# Patient Record
Sex: Female | Born: 1955
Health system: Southern US, Community
[De-identification: ages and names within clinical notes are randomized; demographics above are authoritative.]

## PROBLEM LIST (undated history)

## (undated) DIAGNOSIS — M47812 Spondylosis without myelopathy or radiculopathy, cervical region: Secondary | ICD-10-CM

## (undated) DIAGNOSIS — F329 Major depressive disorder, single episode, unspecified: Secondary | ICD-10-CM

## (undated) DIAGNOSIS — K222 Esophageal obstruction: Secondary | ICD-10-CM

## (undated) DIAGNOSIS — E785 Hyperlipidemia, unspecified: Secondary | ICD-10-CM

## (undated) DIAGNOSIS — I10 Essential (primary) hypertension: Secondary | ICD-10-CM

## (undated) DIAGNOSIS — M6520 Calcific tendinitis, unspecified site: Secondary | ICD-10-CM

## (undated) DIAGNOSIS — M797 Fibromyalgia: Secondary | ICD-10-CM

## (undated) DIAGNOSIS — K449 Diaphragmatic hernia without obstruction or gangrene: Secondary | ICD-10-CM

## (undated) DIAGNOSIS — K219 Gastro-esophageal reflux disease without esophagitis: Secondary | ICD-10-CM

## (undated) DIAGNOSIS — F32A Depression, unspecified: Secondary | ICD-10-CM

## (undated) DIAGNOSIS — G4730 Sleep apnea, unspecified: Secondary | ICD-10-CM

## (undated) HISTORY — DX: Spondylosis without myelopathy or radiculopathy, cervical region: M47.812

## (undated) HISTORY — PX: ROTATOR CUFF REPAIR: SHX139

## (undated) HISTORY — PX: ABDOMINAL HYSTERECTOMY: SHX81

## (undated) HISTORY — DX: Hyperlipidemia, unspecified: E78.5

## (undated) HISTORY — PX: SPINE SURGERY: SHX786

## (undated) HISTORY — PX: BREAST EXCISIONAL BIOPSY: SUR124

## (undated) HISTORY — PX: BREAST SURGERY: SHX581

## (undated) HISTORY — DX: Calcific tendinitis, unspecified site: M65.20

## (undated) HISTORY — DX: Essential (primary) hypertension: I10

## (undated) HISTORY — DX: Esophageal obstruction: K22.2

## (undated) HISTORY — DX: Depression, unspecified: F32.A

## (undated) HISTORY — DX: Sleep apnea, unspecified: G47.30

## (undated) HISTORY — DX: Diaphragmatic hernia without obstruction or gangrene: K44.9

## (undated) HISTORY — DX: Gastro-esophageal reflux disease without esophagitis: K21.9

## (undated) HISTORY — PX: APPENDECTOMY: SHX54

## (undated) HISTORY — DX: Fibromyalgia: M79.7

## (undated) HISTORY — PX: TONSILLECTOMY: SUR1361

## (undated) HISTORY — DX: Major depressive disorder, single episode, unspecified: F32.9

## (undated) HISTORY — PX: GALLBLADDER SURGERY: SHX652

## (undated) HISTORY — PX: CHOLECYSTECTOMY: SHX55

---

## 1998-08-28 ENCOUNTER — Ambulatory Visit (HOSPITAL_COMMUNITY): Admission: RE | Admit: 1998-08-28 | Discharge: 1998-08-28 | Payer: Self-pay | Admitting: Gastroenterology

## 1998-08-28 ENCOUNTER — Encounter: Payer: Self-pay | Admitting: Gastroenterology

## 1999-03-26 ENCOUNTER — Ambulatory Visit (HOSPITAL_COMMUNITY): Admission: RE | Admit: 1999-03-26 | Discharge: 1999-03-26 | Payer: Self-pay | Admitting: Gastroenterology

## 1999-03-26 ENCOUNTER — Encounter: Payer: Self-pay | Admitting: Gastroenterology

## 1999-06-10 ENCOUNTER — Ambulatory Visit (HOSPITAL_COMMUNITY): Admission: RE | Admit: 1999-06-10 | Discharge: 1999-06-10 | Payer: Self-pay | Admitting: Gastroenterology

## 1999-06-10 ENCOUNTER — Encounter: Payer: Self-pay | Admitting: Gastroenterology

## 2000-02-05 ENCOUNTER — Other Ambulatory Visit: Admission: RE | Admit: 2000-02-05 | Discharge: 2000-02-05 | Payer: Self-pay | Admitting: Obstetrics and Gynecology

## 2000-07-05 ENCOUNTER — Encounter: Payer: Self-pay | Admitting: Rheumatology

## 2000-07-05 ENCOUNTER — Encounter: Admission: RE | Admit: 2000-07-05 | Discharge: 2000-07-05 | Payer: Self-pay | Admitting: Rheumatology

## 2001-02-04 ENCOUNTER — Other Ambulatory Visit: Admission: RE | Admit: 2001-02-04 | Discharge: 2001-02-04 | Payer: Self-pay | Admitting: *Deleted

## 2001-02-08 ENCOUNTER — Encounter: Payer: Self-pay | Admitting: Obstetrics and Gynecology

## 2001-02-08 ENCOUNTER — Encounter: Admission: RE | Admit: 2001-02-08 | Discharge: 2001-02-08 | Payer: Self-pay | Admitting: *Deleted

## 2001-03-07 ENCOUNTER — Encounter: Admission: RE | Admit: 2001-03-07 | Discharge: 2001-03-09 | Payer: Self-pay | Admitting: *Deleted

## 2001-03-17 ENCOUNTER — Encounter: Admission: RE | Admit: 2001-03-17 | Discharge: 2001-03-17 | Payer: Self-pay | Admitting: Surgery

## 2001-03-17 ENCOUNTER — Encounter: Payer: Self-pay | Admitting: Surgery

## 2002-10-13 ENCOUNTER — Encounter: Admission: RE | Admit: 2002-10-13 | Discharge: 2002-10-13 | Payer: Self-pay | Admitting: Internal Medicine

## 2002-10-13 ENCOUNTER — Encounter: Payer: Self-pay | Admitting: Internal Medicine

## 2002-11-10 ENCOUNTER — Ambulatory Visit (HOSPITAL_COMMUNITY): Admission: RE | Admit: 2002-11-10 | Discharge: 2002-11-10 | Payer: Self-pay | Admitting: Gastroenterology

## 2004-03-04 ENCOUNTER — Encounter: Admission: RE | Admit: 2004-03-04 | Discharge: 2004-03-04 | Payer: Self-pay | Admitting: Internal Medicine

## 2004-03-06 ENCOUNTER — Encounter: Admission: RE | Admit: 2004-03-06 | Discharge: 2004-03-06 | Payer: Self-pay | Admitting: Internal Medicine

## 2004-06-10 ENCOUNTER — Inpatient Hospital Stay (HOSPITAL_COMMUNITY): Admission: RE | Admit: 2004-06-10 | Discharge: 2004-06-17 | Payer: Self-pay | Admitting: Psychiatry

## 2004-06-10 ENCOUNTER — Emergency Department (HOSPITAL_COMMUNITY): Admission: EM | Admit: 2004-06-10 | Discharge: 2004-06-10 | Payer: Self-pay | Admitting: Emergency Medicine

## 2004-06-10 ENCOUNTER — Ambulatory Visit: Payer: Self-pay | Admitting: Psychiatry

## 2006-10-20 ENCOUNTER — Ambulatory Visit: Payer: Self-pay

## 2007-12-16 ENCOUNTER — Encounter: Admission: RE | Admit: 2007-12-16 | Discharge: 2007-12-16 | Payer: Self-pay | Admitting: Internal Medicine

## 2007-12-25 ENCOUNTER — Encounter: Admission: RE | Admit: 2007-12-25 | Discharge: 2007-12-25 | Payer: Self-pay | Admitting: Internal Medicine

## 2008-04-19 ENCOUNTER — Encounter: Admission: RE | Admit: 2008-04-19 | Discharge: 2008-04-19 | Payer: Self-pay | Admitting: Internal Medicine

## 2009-02-04 ENCOUNTER — Emergency Department: Payer: Self-pay | Admitting: Emergency Medicine

## 2009-03-15 ENCOUNTER — Emergency Department: Payer: Self-pay | Admitting: Emergency Medicine

## 2009-03-21 ENCOUNTER — Emergency Department: Payer: Self-pay | Admitting: Emergency Medicine

## 2009-07-08 ENCOUNTER — Encounter
Admission: RE | Admit: 2009-07-08 | Discharge: 2009-08-01 | Payer: Self-pay | Admitting: Physical Medicine & Rehabilitation

## 2009-07-09 ENCOUNTER — Ambulatory Visit: Payer: Self-pay | Admitting: Physical Medicine & Rehabilitation

## 2009-07-24 ENCOUNTER — Encounter: Payer: Self-pay | Admitting: Physical Medicine & Rehabilitation

## 2009-08-06 ENCOUNTER — Encounter
Admission: RE | Admit: 2009-08-06 | Discharge: 2009-08-07 | Payer: Self-pay | Admitting: Physical Medicine & Rehabilitation

## 2009-08-07 ENCOUNTER — Ambulatory Visit: Payer: Self-pay | Admitting: Physical Medicine & Rehabilitation

## 2009-08-10 ENCOUNTER — Encounter: Payer: Self-pay | Admitting: Physical Medicine & Rehabilitation

## 2009-09-05 ENCOUNTER — Encounter
Admission: RE | Admit: 2009-09-05 | Discharge: 2009-12-04 | Payer: Self-pay | Admitting: Physical Medicine & Rehabilitation

## 2009-09-06 ENCOUNTER — Ambulatory Visit: Payer: Self-pay | Admitting: Physical Medicine & Rehabilitation

## 2009-09-10 ENCOUNTER — Encounter: Payer: Self-pay | Admitting: Physical Medicine & Rehabilitation

## 2009-10-18 ENCOUNTER — Ambulatory Visit: Payer: Self-pay | Admitting: Physical Medicine & Rehabilitation

## 2009-11-27 ENCOUNTER — Ambulatory Visit: Payer: Self-pay | Admitting: Physical Medicine & Rehabilitation

## 2009-12-19 ENCOUNTER — Encounter
Admission: RE | Admit: 2009-12-19 | Discharge: 2010-03-14 | Payer: Self-pay | Admitting: Physical Medicine & Rehabilitation

## 2009-12-26 ENCOUNTER — Ambulatory Visit: Payer: Self-pay | Admitting: Physical Medicine & Rehabilitation

## 2010-01-03 DIAGNOSIS — G894 Chronic pain syndrome: Secondary | ICD-10-CM | POA: Insufficient documentation

## 2010-02-06 ENCOUNTER — Ambulatory Visit: Payer: Self-pay | Admitting: Physical Medicine & Rehabilitation

## 2010-02-18 ENCOUNTER — Encounter: Admission: RE | Admit: 2010-02-18 | Discharge: 2010-02-18 | Payer: Self-pay | Admitting: Family Medicine

## 2010-03-14 ENCOUNTER — Encounter
Admission: RE | Admit: 2010-03-14 | Discharge: 2010-06-12 | Payer: Self-pay | Admitting: Physical Medicine & Rehabilitation

## 2010-03-21 ENCOUNTER — Ambulatory Visit: Payer: Self-pay | Admitting: Physical Medicine & Rehabilitation

## 2010-04-24 ENCOUNTER — Ambulatory Visit: Payer: Self-pay | Admitting: Physical Medicine & Rehabilitation

## 2010-05-29 ENCOUNTER — Ambulatory Visit: Payer: Self-pay | Admitting: Physical Medicine & Rehabilitation

## 2010-06-19 ENCOUNTER — Encounter
Admission: RE | Admit: 2010-06-19 | Discharge: 2010-08-07 | Payer: Self-pay | Source: Home / Self Care | Attending: Physical Medicine & Rehabilitation | Admitting: Physical Medicine & Rehabilitation

## 2010-06-27 ENCOUNTER — Ambulatory Visit: Payer: Self-pay | Admitting: Physical Medicine & Rehabilitation

## 2010-07-24 ENCOUNTER — Ambulatory Visit: Payer: Self-pay | Admitting: Physical Medicine & Rehabilitation

## 2010-08-07 ENCOUNTER — Ambulatory Visit: Payer: Self-pay | Admitting: Physical Medicine & Rehabilitation

## 2010-08-07 DIAGNOSIS — M5136 Other intervertebral disc degeneration, lumbar region: Secondary | ICD-10-CM

## 2010-09-03 ENCOUNTER — Encounter
Admission: RE | Admit: 2010-09-03 | Discharge: 2010-09-04 | Payer: Self-pay | Source: Home / Self Care | Attending: Physical Medicine & Rehabilitation | Admitting: Physical Medicine & Rehabilitation

## 2010-09-04 ENCOUNTER — Ambulatory Visit
Admission: RE | Admit: 2010-09-04 | Discharge: 2010-09-04 | Payer: Self-pay | Source: Home / Self Care | Attending: Physical Medicine & Rehabilitation | Admitting: Physical Medicine & Rehabilitation

## 2010-10-02 ENCOUNTER — Ambulatory Visit: Payer: Self-pay

## 2010-10-17 ENCOUNTER — Encounter: Payer: Medicaid Other | Attending: Physical Medicine & Rehabilitation

## 2010-10-17 ENCOUNTER — Ambulatory Visit (HOSPITAL_BASED_OUTPATIENT_CLINIC_OR_DEPARTMENT_OTHER): Payer: Medicaid Other | Admitting: Physical Medicine & Rehabilitation

## 2010-10-17 DIAGNOSIS — E119 Type 2 diabetes mellitus without complications: Secondary | ICD-10-CM | POA: Insufficient documentation

## 2010-10-17 DIAGNOSIS — M67919 Unspecified disorder of synovium and tendon, unspecified shoulder: Secondary | ICD-10-CM | POA: Insufficient documentation

## 2010-10-17 DIAGNOSIS — F329 Major depressive disorder, single episode, unspecified: Secondary | ICD-10-CM

## 2010-10-17 DIAGNOSIS — M47812 Spondylosis without myelopathy or radiculopathy, cervical region: Secondary | ICD-10-CM

## 2010-10-17 DIAGNOSIS — M7530 Calcific tendinitis of unspecified shoulder: Secondary | ICD-10-CM

## 2010-10-17 DIAGNOSIS — IMO0001 Reserved for inherently not codable concepts without codable children: Secondary | ICD-10-CM | POA: Insufficient documentation

## 2010-10-17 DIAGNOSIS — F3289 Other specified depressive episodes: Secondary | ICD-10-CM | POA: Insufficient documentation

## 2010-10-17 DIAGNOSIS — M7520 Bicipital tendinitis, unspecified shoulder: Secondary | ICD-10-CM | POA: Insufficient documentation

## 2010-10-17 DIAGNOSIS — G8929 Other chronic pain: Secondary | ICD-10-CM | POA: Insufficient documentation

## 2010-10-17 DIAGNOSIS — M7700 Medial epicondylitis, unspecified elbow: Secondary | ICD-10-CM | POA: Insufficient documentation

## 2010-10-17 DIAGNOSIS — G43909 Migraine, unspecified, not intractable, without status migrainosus: Secondary | ICD-10-CM | POA: Insufficient documentation

## 2010-10-17 DIAGNOSIS — M719 Bursopathy, unspecified: Secondary | ICD-10-CM | POA: Insufficient documentation

## 2010-11-20 DIAGNOSIS — K219 Gastro-esophageal reflux disease without esophagitis: Secondary | ICD-10-CM | POA: Insufficient documentation

## 2010-11-24 ENCOUNTER — Encounter: Payer: Medicaid Other | Attending: Physical Medicine & Rehabilitation | Admitting: Physical Medicine & Rehabilitation

## 2010-11-27 ENCOUNTER — Encounter: Payer: Medicaid Other | Attending: Physical Medicine & Rehabilitation

## 2010-11-27 DIAGNOSIS — M7530 Calcific tendinitis of unspecified shoulder: Secondary | ICD-10-CM

## 2010-11-27 DIAGNOSIS — M7700 Medial epicondylitis, unspecified elbow: Secondary | ICD-10-CM | POA: Insufficient documentation

## 2010-11-27 DIAGNOSIS — M719 Bursopathy, unspecified: Secondary | ICD-10-CM | POA: Insufficient documentation

## 2010-11-27 DIAGNOSIS — M7520 Bicipital tendinitis, unspecified shoulder: Secondary | ICD-10-CM | POA: Insufficient documentation

## 2010-11-27 DIAGNOSIS — M549 Dorsalgia, unspecified: Secondary | ICD-10-CM | POA: Insufficient documentation

## 2010-11-27 DIAGNOSIS — F329 Major depressive disorder, single episode, unspecified: Secondary | ICD-10-CM

## 2010-11-27 DIAGNOSIS — M67919 Unspecified disorder of synovium and tendon, unspecified shoulder: Secondary | ICD-10-CM | POA: Insufficient documentation

## 2010-11-27 DIAGNOSIS — M47812 Spondylosis without myelopathy or radiculopathy, cervical region: Secondary | ICD-10-CM

## 2010-11-27 DIAGNOSIS — IMO0001 Reserved for inherently not codable concepts without codable children: Secondary | ICD-10-CM | POA: Insufficient documentation

## 2010-11-27 DIAGNOSIS — F3289 Other specified depressive episodes: Secondary | ICD-10-CM

## 2010-11-27 DIAGNOSIS — E119 Type 2 diabetes mellitus without complications: Secondary | ICD-10-CM | POA: Insufficient documentation

## 2010-12-24 ENCOUNTER — Encounter: Payer: Medicaid Other | Attending: Physical Medicine & Rehabilitation | Admitting: Physical Medicine & Rehabilitation

## 2010-12-24 DIAGNOSIS — F329 Major depressive disorder, single episode, unspecified: Secondary | ICD-10-CM | POA: Insufficient documentation

## 2010-12-24 DIAGNOSIS — E119 Type 2 diabetes mellitus without complications: Secondary | ICD-10-CM | POA: Insufficient documentation

## 2010-12-24 DIAGNOSIS — M7530 Calcific tendinitis of unspecified shoulder: Secondary | ICD-10-CM

## 2010-12-24 DIAGNOSIS — M47812 Spondylosis without myelopathy or radiculopathy, cervical region: Secondary | ICD-10-CM

## 2010-12-24 DIAGNOSIS — F3289 Other specified depressive episodes: Secondary | ICD-10-CM | POA: Insufficient documentation

## 2010-12-24 DIAGNOSIS — IMO0001 Reserved for inherently not codable concepts without codable children: Secondary | ICD-10-CM | POA: Insufficient documentation

## 2010-12-24 DIAGNOSIS — M67919 Unspecified disorder of synovium and tendon, unspecified shoulder: Secondary | ICD-10-CM | POA: Insufficient documentation

## 2010-12-24 DIAGNOSIS — M7520 Bicipital tendinitis, unspecified shoulder: Secondary | ICD-10-CM | POA: Insufficient documentation

## 2010-12-24 DIAGNOSIS — M7700 Medial epicondylitis, unspecified elbow: Secondary | ICD-10-CM | POA: Insufficient documentation

## 2010-12-24 DIAGNOSIS — M719 Bursopathy, unspecified: Secondary | ICD-10-CM | POA: Insufficient documentation

## 2010-12-24 NOTE — Assessment & Plan Note (Signed)
Robin Morgan is back regarding her multiple pain complaints.  She has generally been doing fairly well.  She has some stresses at home related to some psychosocial issues, but most part she has been stable.  She does note that she has pain when she is more active.  She visits her father on the farm once a week and notes the next day  she is tight. Usually after any type of prolonged exertion and she sits down, she will wake up or get up later with a lot of pain and tightness.  Shoulder and neck tightness usually leads to headache symptoms.  REVIEW OF SYSTEMS:  Notable for occasional nausea, diarrhea, occasional abdominal pain, bowel control issues, weakness.  Full 12-point review is in the written health and history section of the chart.  SOCIAL HISTORY:  Unchanged.  PHYSICAL EXAMINATION:  VITAL SIGNS:  Blood pressure is 146/60, pulse is 89, respiratory rate 18.  She is satting 97% on room air. GENERAL:  The patient is pleasant, alert and oriented x3.  Affect is generally bright and appropriate. MUSCULOSKELETAL:  She has some tenderness with palpation over the trunk into the shoulder blade and cervical region.  Range of motion was fair. Strength is generally 4+-5/5 throughout. HEART:  Regular. CHEST:  Clear. ABDOMEN:  Soft, nontender.  ASSESSMENT: 1. Fibromyalgia syndrome. 2. Chronic myofascial pain. 3. Right rotator cuff syndrome and bicipital tendonitis. 4. Left medial epicondylitis. 5. Diabetes. 6. Depression.  PLAN: 1. Again, I discussed appropriate stretching, range of motion     modalities, posture, etc.  Therapy would be the most ideal location     for this.  I told her I was not going to force her to go to the     therapy, however.  She will call me if she wants to pursue this.     She will begin working on better exercise regimen in routine going     forward. 2. I have refilled her fentanyl patch 50 mcg q.72 hours, #10 for next     month.  She will stay with Robaxin  p.r.n. as     well as hydrocodone for breakthrough pain. 3. She will see my nurse practitioner back in about 6-7 weeks' time.     Robin Morgan, M.D. Electronically Signed    ZTS/MedQ D:  12/24/2010 11:49:39  T:  12/24/2010 23:09:15  Job #:  [id number]  cc:   Maryelizabeth Rowan, M.D. Fax: [phone]  Elizabeth Palau, FNP Fax: [phone]

## 2010-12-26 NOTE — Discharge Summary (Signed)
NAMEJOORY, GOUGH NO.:  [id number]   MEDICAL RECORD NO.:  [id number]          PATIENT TYPE:  IPS   LOCATION:  0305                          FACILITY:  [facility]   PHYSICIAN:  Jeanice Lim, M.D. DATE OF BIRTH:  1956/05/02   DATE OF ADMISSION:  06/10/2004  DATE OF DISCHARGE:  06/17/2004                                 DISCHARGE SUMMARY   IDENTIFYING DATA:  This is a 55 year old Caucasian female, married,  voluntarily admitted.  Referred to the ER, taking 6 Xanax tablets.  Had  chronic marital conflict.  Was planning on getting a divorce.  Husband may  have been having an affair.  Took Xanax to go to sleep.  Endorsed three  weeks of increased depressed mood and passive suicidal thoughts and poor  sleep.   MEDICAL PROBLEMS:  Fibromyalgia, ankylosing spondylitis.   PAST PSYCHIATRIC HISTORY:  First [facility] admission and  first inpatient treatment.  Followed by Dr. Evelene Croon.  No prior suicide  attempts.   MEDICATIONS:  See multiple medications on admission assessment.   ALLERGIES:  COMPAZINE and NAPROSYN.   PHYSICAL EXAMINATION:  Physical and neurologic exam within normal limits.   LABORATORY DATA:  Routine admission labs within normal limits.   MENTAL STATUS EXAM:  Fully alert, tearful, cooperative.  Speech normal.  Mood depressed, hopeless, helpless at times.  Thought processes positive  fleeting suicidal ideation.  No plan.  Some thought agitation.  Cognitively  intact.  Judgment and insight fair.   ADMISSION DIAGNOSES:   AXIS I:  Major depressive disorder, recurrent, moderate.   AXIS II:  Deferred.   AXIS III:  1.  Diabetes mellitus, type 2.  2.  Fibromyalgia.  3.  Chronic diarrhea.   AXIS IV:  Severe (chronic marital strife and limited support system).   AXIS V:  20/60.   HOSPITAL COURSE:  The patient was admitted and ordered routine p.r.n.  medications and underwent further monitoring.  Was encouraged to participate  in  individual, group and milieu therapy.  The patient was placed on safety  checks.  Neurontin was optimized for chronic pain and patient was started on  Cymbalta.  Family conflict seemed to be one of the primary triggers.  The  patient also complained of fibromyalgia, son had died, sister had died.  Admitted to taking more Xanax than she was supposed to.  Had difficulty with  pain and depression.  Followed by Dr. Evelene Croon.  Had fleeting suicidal thoughts  initially and, as she was stabilized on medications and received clinical  intervention and family meeting was held with husband, which patient became  somewhat increasingly agitated during, there was further problem-solving,  coping skills worked on, and adjustments on medications, the patient  continued to improved.   CONDITION ON DISCHARGE:  Discharged in improved condition with euthymic  mood.  Affect brighter.  No suicidal thoughts.  No psychotic symptoms.  Had  good aftercare plan as well as more able to problem-solve with improved  judgment and insight.  She was given medication education.   DISCHARGE MEDICATIONS:  1.  Glucophage to continue as  previously prescribed.  2.  Metformin to continue as previously prescribed.  3.  Inderal to take as prescribed by outpatient physician.  4.  Protonix to take as prescribed by outpatient physician.  5.  Zocor to take as prescribed by outpatient physician.  6.  Lamictal to take as prescribed by outpatient physician.  7.  Ultram to take as prescribed by outpatient physician.  8.  Cymbalta 30 mg, 3 q.a.m.  9.  Neurontin 300 mg, 1 at 9 a.m., 3 p.m. and 9 p.m.  10. Xanax 0.25 mg, 1 t.i.d. and 2 at bedtime.  11. Ambien 10 mg, 1 at bedtime p.r.n. for insomnia.   FOLLOW UP:  Dr. Evelene Croon on Tuesday, July 01, 2004 at 1:45 p.m. and [facility] with Darrold Junker for therapy on June 26, 2004 at 3 p.m.   DISCHARGE DIAGNOSES:   AXIS I:  Major depressive disorder, recurrent, moderate.    AXIS II:  Deferred.   AXIS III:  1.  Diabetes mellitus, type 2.  2.  Fibromyalgia.  3.  Chronic diarrhea.   AXIS IV:  Severe (chronic marital strife and limited support system).   AXIS V:  Global Assessment of Functioning on discharge 55-60.     Jame   JEM/MEDQ  D:  07/17/2004  T:  07/17/2004  Job:  [id number]

## 2010-12-26 NOTE — Op Note (Signed)
   NAME:  Robin Morgan, Robin Morgan                        ACCOUNT NO.:  [id number]   MEDICAL RECORD NO.:  [id number]                   PATIENT TYPE:  AMB   LOCATION:  ENDO                                 FACILITY:  [facility]   PHYSICIAN:  John C. Madilyn Fireman, M.D.                 DATE OF BIRTH:  1956-05-29   DATE OF PROCEDURE:  11/10/2002  DATE OF DISCHARGE:                                 OPERATIVE REPORT   PROCEDURE:  Esophagogastroduodenoscopy with esophageal dilatation.   INDICATIONS FOR PROCEDURE:  Recurrent dysphagia which has responded to  esophageal dilatation in the past although no definite stricture seen at  that time.   DESCRIPTION OF PROCEDURE:  The patient was placed in the left lateral  decubitus position then placed on the pulse monitor with continuous low flow  oxygen delivered by nasal cannula. She was sedated with 75 mcg IV fentanyl  and 9 mg IV Versed. The Olympus video endoscope was advanced under direct  vision into the oropharynx and esophagus. The esophagus was straight and of  normal caliber with the squamocolumnar line at 38 cm. There was no visible  hiatal hernia, ring, stricture or other abnormality of the GE junction. The  stomach was entered and a small amount of liquid secretions were suctioned  from the fundus. Retroflexed view of the cardia was unremarkable. The  fundus, body, antrum and pylorus all appeared normal. The duodenum was  entered and both the bulb and second portion are well inspected and appeared  to be within normal limits. A Savary guidewire was placed through the  endoscope channel and the scope withdrawn. Savary dilators of 16 and 17 mm  were passed consecutively under fluoroscopic visualization with minimal  resistance and no blood seen on withdrawal. The last dilator was removed  together with the wire and the patient returned to the recovery room in  stable condition. The patient tolerated the procedure well and there were no  immediate  complications.   IMPRESSION:  Basically normal endoscopy status post dilatation to 17 mm.   PLAN:  Advance diet and observe response to dilatation.                                               John C. Madilyn Fireman, M.D.    JCH/MEDQ  D:  11/10/2002  T:  11/10/2002  Job:  [id number]   cc:   Georgann Housekeeper, M.D.  [address]  [place]  [place] [address]  Fax: [phone]

## 2011-02-05 ENCOUNTER — Encounter: Payer: Medicaid Other | Attending: Physical Medicine & Rehabilitation | Admitting: Neurosurgery

## 2011-02-05 DIAGNOSIS — IMO0001 Reserved for inherently not codable concepts without codable children: Secondary | ICD-10-CM

## 2011-02-05 DIAGNOSIS — M75100 Unspecified rotator cuff tear or rupture of unspecified shoulder, not specified as traumatic: Secondary | ICD-10-CM

## 2011-02-05 DIAGNOSIS — R5381 Other malaise: Secondary | ICD-10-CM | POA: Insufficient documentation

## 2011-02-05 DIAGNOSIS — M7700 Medial epicondylitis, unspecified elbow: Secondary | ICD-10-CM | POA: Insufficient documentation

## 2011-02-05 DIAGNOSIS — F329 Major depressive disorder, single episode, unspecified: Secondary | ICD-10-CM

## 2011-02-05 DIAGNOSIS — R5383 Other fatigue: Secondary | ICD-10-CM | POA: Insufficient documentation

## 2011-02-05 DIAGNOSIS — E119 Type 2 diabetes mellitus without complications: Secondary | ICD-10-CM | POA: Insufficient documentation

## 2011-02-05 DIAGNOSIS — M719 Bursopathy, unspecified: Secondary | ICD-10-CM | POA: Insufficient documentation

## 2011-02-05 DIAGNOSIS — F3289 Other specified depressive episodes: Secondary | ICD-10-CM | POA: Insufficient documentation

## 2011-02-05 DIAGNOSIS — M62838 Other muscle spasm: Secondary | ICD-10-CM | POA: Insufficient documentation

## 2011-02-05 DIAGNOSIS — M67919 Unspecified disorder of synovium and tendon, unspecified shoulder: Secondary | ICD-10-CM | POA: Insufficient documentation

## 2011-02-06 NOTE — Assessment & Plan Note (Signed)
This patient of Dr. Riley Kill, for multiple pain complaints.  She has fibromyalgia, states she has been doing fairly well and has no changes in her condition.  She rates her average pain about 8-9, it is constant aching-type pain.  General activity level 6-7.  Pain is worse in the daytime and night, sitting, some activities tend to aggravate rest. Heat and medication help.  She walks without assistance.  She climb steps and drives, she is on disability.  REVIEW OF SYSTEMS:  Notable for the difficulties described above as well as diarrhea, nausea, bladder and bowel control problems, and intermittent weakness, depression, spasms, no suicidal thoughts or aberrant behaviors.  PAST MEDICAL HISTORY AND SOCIAL HISTORY:  Unchanged.  FAMILY HISTORY:  Unchanged.  PHYSICAL EXAMINATION:  VITAL SIGNS:  Blood pressure was not obtained, pulse 89, respirations 16, O2 sats 97 on room air. MUSCULOSKELETAL:  Motor strength is good in the lower upper and lower extremities.  Sensation is intact. NEUROLOGIC:  Constitutionally within normal limits.  She is alert and oriented x3.  She walks with normal gait.  ASSESSMENT: 1. Fibromyalgia. 2. Rotator cuff syndrome on the right. 3. Intermittent left medial epicondylitis. 4. Diabetes. 5. Depression.  PLAN: 1. She has not had any formal physical therapy.  She is doing home     exercises that she can. 2. She will continue her current medication regimen. 3. Refill her fentanyl 50 mcg one transdermally every 72 hours, 10 no     refill. 4. Hydrocodone 5/500 one-half to one p.o. daily, 60 with no refill.  Her questions were encouraged and answered.     Mollee Neer L. Blima Dessert Electronically Signed    RLW/MedQ D:  02/05/2011 10:31:11  T:  02/06/2011 00:26:46  Job #:  [id number]

## 2011-03-05 ENCOUNTER — Encounter: Payer: Medicaid Other | Attending: Physical Medicine & Rehabilitation | Admitting: Neurosurgery

## 2011-03-05 DIAGNOSIS — E119 Type 2 diabetes mellitus without complications: Secondary | ICD-10-CM | POA: Insufficient documentation

## 2011-03-05 DIAGNOSIS — IMO0001 Reserved for inherently not codable concepts without codable children: Secondary | ICD-10-CM | POA: Insufficient documentation

## 2011-03-05 DIAGNOSIS — R5381 Other malaise: Secondary | ICD-10-CM | POA: Insufficient documentation

## 2011-03-05 DIAGNOSIS — F329 Major depressive disorder, single episode, unspecified: Secondary | ICD-10-CM | POA: Insufficient documentation

## 2011-03-05 DIAGNOSIS — M7700 Medial epicondylitis, unspecified elbow: Secondary | ICD-10-CM | POA: Insufficient documentation

## 2011-03-05 DIAGNOSIS — M25529 Pain in unspecified elbow: Secondary | ICD-10-CM

## 2011-03-05 DIAGNOSIS — R5383 Other fatigue: Secondary | ICD-10-CM | POA: Insufficient documentation

## 2011-03-05 DIAGNOSIS — M67919 Unspecified disorder of synovium and tendon, unspecified shoulder: Secondary | ICD-10-CM | POA: Insufficient documentation

## 2011-03-05 DIAGNOSIS — M719 Bursopathy, unspecified: Secondary | ICD-10-CM | POA: Insufficient documentation

## 2011-03-05 DIAGNOSIS — F3289 Other specified depressive episodes: Secondary | ICD-10-CM | POA: Insufficient documentation

## 2011-03-05 DIAGNOSIS — M62838 Other muscle spasm: Secondary | ICD-10-CM | POA: Insufficient documentation

## 2011-03-05 NOTE — Assessment & Plan Note (Signed)
ACCOUNT:  [id number].  This is a patient of Dr. Riley Kill, seen for multiple pain complaints, fibromyalgia.  She states she had been doing some better.  She still have some aching in her back and legs, but otherwise unchanged.  She does have some right biceps pain and she uses Voltaren gel on that.  She rates her pain at 7-8.  Her activity level is the same.  The pain is worse in the evening and night.  Sleep patterns are fair.  Most activities aggravate.  Medication and injections tend to help.  She walks without assistance.  She can drive, climb steps.  She walks about 15 minutes at a times.  She is on disability.  REVIEW OF SYSTEMS:  Notable for those difficulties as well as some fluctuating blood sugars, spasm, depression.  No suicidal thoughts or aberrant behaviors.  PAST MEDICAL HISTORY:  Unchanged.  SOCIAL HISTORY:  Unchanged.  FAMILY HISTORY:  Unchanged.  PHYSICAL EXAM:  Her vital signs were not obtained today because of the biceps pain.  She is having her O2 sats 99 on room air, respirations are 20.  Constitutionally, she is within normal limits.  She is alert and oriented x3.  She has normal gait.  Her strength and sensation are intact.  ASSESSMENT: 1. Fibromyalgia. 2. History of rotator cuff syndrome on the right. 3. Intermittent left medial epicondylitis. 4. Diabetes. 5. Depression.  PLAN: 1. She will continue her home exercise program. 2. Her current medications will stay the same. 3. We will refill her vitamin D 50,000 units one p.o. q.4 week, two     refills. 4. Fentanyl 50 mcg once transdermally every 72 hours, #10 with no     refill. 5. Hydrocodone 5/500 one half to one every hour b.i.d., #60 with no     refill.  She will follow up here in the clinic in 1 month.  Her     questions were encouraged and answered.     Xoe Hoe L. Blima Dessert Electronically Signed    RLW/MedQ D:  03/05/2011 21:30:86  T:  03/05/2011 09:48:09  Job #:  [id number]

## 2011-04-02 ENCOUNTER — Encounter: Payer: Medicaid Other | Attending: Physical Medicine & Rehabilitation | Admitting: Neurosurgery

## 2011-04-02 DIAGNOSIS — M25529 Pain in unspecified elbow: Secondary | ICD-10-CM

## 2011-04-02 DIAGNOSIS — R5381 Other malaise: Secondary | ICD-10-CM | POA: Insufficient documentation

## 2011-04-02 DIAGNOSIS — F329 Major depressive disorder, single episode, unspecified: Secondary | ICD-10-CM | POA: Insufficient documentation

## 2011-04-02 DIAGNOSIS — R5383 Other fatigue: Secondary | ICD-10-CM | POA: Insufficient documentation

## 2011-04-02 DIAGNOSIS — M719 Bursopathy, unspecified: Secondary | ICD-10-CM | POA: Insufficient documentation

## 2011-04-02 DIAGNOSIS — M62838 Other muscle spasm: Secondary | ICD-10-CM | POA: Insufficient documentation

## 2011-04-02 DIAGNOSIS — IMO0001 Reserved for inherently not codable concepts without codable children: Secondary | ICD-10-CM

## 2011-04-02 DIAGNOSIS — M7700 Medial epicondylitis, unspecified elbow: Secondary | ICD-10-CM | POA: Insufficient documentation

## 2011-04-02 DIAGNOSIS — E119 Type 2 diabetes mellitus without complications: Secondary | ICD-10-CM | POA: Insufficient documentation

## 2011-04-02 DIAGNOSIS — S43429A Sprain of unspecified rotator cuff capsule, initial encounter: Secondary | ICD-10-CM

## 2011-04-02 DIAGNOSIS — M67919 Unspecified disorder of synovium and tendon, unspecified shoulder: Secondary | ICD-10-CM | POA: Insufficient documentation

## 2011-04-02 DIAGNOSIS — F3289 Other specified depressive episodes: Secondary | ICD-10-CM | POA: Insufficient documentation

## 2011-04-02 NOTE — Assessment & Plan Note (Signed)
HISTORY:  Patient of Dr. Riley Kill, seen for multiple pain complaints as well as fibromyalgia.  Today, she tells me that she has some right shoulder pain.  She has had surgery with Dr. Jerl Santos before and feels like her rotator cuff may be "messed up again" but she is not going back to Dr. Jerl Santos, due to financial reasons.  Her average pain is 8-9, it is aching type pain.  General activity level is 9.  Pain is worse in morning and night.  Pain is worse with standing.  Rest and medication tend to help.  She is functionally independent with her mobility.  She is on disability.  REVIEW OF SYSTEMS:  Notable for those difficulties well as some numbness and spasms, depression.  No anxiety or suicidal thoughts or aberrant behaviors.  PAST MEDICAL HISTORY:  Unchanged.  SOCIAL HISTORY:  Divorced.  FAMILY HISTORY:  Unchanged.  PHYSICAL EXAMINATION:  VITAL SIGNS:  Blood pressure 138/74, pulse 95, respirations 16, and O2 sats 94% on room air. EXTREMITIES:  Motor strength in her upper extremities and the deltoid, biceps, triceps is 5/5.  She has good strength in lower extremities. Sensation is intact. CONSTITUTIONAL:  Within normal limits.  She is alert and oriented x3. She has a normal gait.  ASSESSMENT: 1. Fibromyalgia. 2. History of rotator cuff repair with acute pain. 3. Diabetes. 4. Depression.  PLAN: 1. She will continue her home exercise program that she is doing.  She     does not need refills today.  She will continue her current     medication regimen of hydrocodone and fentanyl patch. 2. I explained to the patient I could inject her shoulder today she     accepted that.  After informed consent, I alcohol prepped the right     posterior AC joint portal and injected her with 3 mL of lidocaine     and 1 mL of 40 mg of Depo-Medrol, she tolerated well.  She noticed     ice that tonight and not do any heavy lifting, but to keep some     slight range of motion going for the next  couple days, otherwise     her questions were encouraged and answered.  She will do well and     will see her back in a month.     Deshea Pooley L. Blima Dessert Electronically Signed    RLW/MedQ D:  04/02/2011 10:24:30  T:  04/02/2011 12:01:58  Job #:  [id number]

## 2011-05-01 ENCOUNTER — Encounter: Payer: Medicaid Other | Attending: Physical Medicine & Rehabilitation | Admitting: Physical Medicine & Rehabilitation

## 2011-05-01 DIAGNOSIS — F329 Major depressive disorder, single episode, unspecified: Secondary | ICD-10-CM

## 2011-05-01 DIAGNOSIS — M542 Cervicalgia: Secondary | ICD-10-CM | POA: Insufficient documentation

## 2011-05-01 DIAGNOSIS — IMO0001 Reserved for inherently not codable concepts without codable children: Secondary | ICD-10-CM

## 2011-05-01 DIAGNOSIS — Z9889 Other specified postprocedural states: Secondary | ICD-10-CM | POA: Insufficient documentation

## 2011-05-01 DIAGNOSIS — M7530 Calcific tendinitis of unspecified shoulder: Secondary | ICD-10-CM

## 2011-05-01 DIAGNOSIS — M25519 Pain in unspecified shoulder: Secondary | ICD-10-CM | POA: Insufficient documentation

## 2011-05-01 DIAGNOSIS — R52 Pain, unspecified: Secondary | ICD-10-CM | POA: Insufficient documentation

## 2011-05-01 DIAGNOSIS — M47812 Spondylosis without myelopathy or radiculopathy, cervical region: Secondary | ICD-10-CM

## 2011-05-01 DIAGNOSIS — E119 Type 2 diabetes mellitus without complications: Secondary | ICD-10-CM | POA: Insufficient documentation

## 2011-05-01 NOTE — Assessment & Plan Note (Signed)
HISTORY:  Robin Morgan is back regarding her multiple pain complaints.  She continues to pain complaint of the neck and scapular pain, more lateral shoulder pain, it was better after the injection performed by Lauree Chandler, a month ago.  She notes that the neck bothers her when she is up standing prolonged, sitting for prolonged period of time, and when she is more active.  She rates her pain and 7-8/10.  REVIEW OF SYSTEMS:  Notable for the above.  Full 12-point review is in the written health and history section of the chart.  SOCIAL HISTORY:  Unchanged.  PHYSICAL EXAMINATION:  VITAL SIGNS:  Blood pressure is 144/55, pulse is 115, respiratory rate 18, and she is satting 95% on room air. GENERAL:  The patient is generally pleasant.  She says with fairly good posture in the head  and bit of a forward position.  When I palpated the neck, there is trigger points along the mid to upper trap as well as long the right rhomboid.  She is a bit spastic all throughout the rhomboid in fact.  The right shoulder may have been a bit more protracted in the left.  Strength is grossly 5/5, normal sensation. Surgical scars noted in the cervical spine from about C5 to C1.  ASSESSMENT: 1. Fibromyalgia syndrome. 2. Rotator cuff repair with acute pain which is improved after     injection. 3. Cervical myofascial pain. 4. Remote cervical surgery/decompression. 5. Diabetes.  PLAN: 1. After informed consent, we injected four trigger ports each with 2     mL of 1% lidocaine.  I injected 3  areas in the trap and one area     in the rhomboid today. 2. I refilled her fentanyl patch #10, 50 mcg and hydrocodone 5/500,     #60. 3. Discussed appropriate posture, stretching, modalities including     heat nice. 4. I will see her back in about a month.  If pain is not improving,     may consider followup imaging of the neck which has been done for     some time per the patient.     Ranelle Oyster,  M.D. Electronically Signed    ZTS/MedQ D:  05/01/2011 13:41:54  T:  05/01/2011 17:37:38  Job #:  [id number]

## 2011-05-29 ENCOUNTER — Encounter: Payer: Medicaid Other | Attending: Neurosurgery | Admitting: Neurosurgery

## 2011-05-29 DIAGNOSIS — M542 Cervicalgia: Secondary | ICD-10-CM

## 2011-05-29 DIAGNOSIS — M25569 Pain in unspecified knee: Secondary | ICD-10-CM | POA: Insufficient documentation

## 2011-05-29 DIAGNOSIS — IMO0001 Reserved for inherently not codable concepts without codable children: Secondary | ICD-10-CM | POA: Insufficient documentation

## 2011-05-29 DIAGNOSIS — R51 Headache: Secondary | ICD-10-CM | POA: Insufficient documentation

## 2011-05-29 DIAGNOSIS — M549 Dorsalgia, unspecified: Secondary | ICD-10-CM | POA: Insufficient documentation

## 2011-05-29 DIAGNOSIS — M961 Postlaminectomy syndrome, not elsewhere classified: Secondary | ICD-10-CM

## 2011-05-30 NOTE — Assessment & Plan Note (Signed)
This is a patient of Dr. Riley Kill seen for multiple pain complaints, fibromyalgia, back pain, and headache.  She states that the trigger points he gave her last time on the right helped right-sided headache, but she has had left-sided headache ever since.  She rates her pain at 8 or 9.  It is a constant pain.  General activity level is 8-9.  Pain is worse during the day.  Sleep patterns are poor.  Pain is worse with sitting and standing.  Medication tends to help with injections. Functionally, she is independent.  She does drive.  She is on disability.  She needs some help with household duties and shopping.  REVIEW OF SYSTEMS:  Notable for difficulties described above as well as some blood sugar fluctuations, some diarrhea, bowel control issues, and weakness.  No depression, suicidal thoughts, or aberrant behaviors. Pill counts were correct.  Last UDS was consistent.  Past medical history, social history, and family history unchanged.  PHYSICAL EXAMINATION:  VITAL SIGNS:  Blood pressure is 118/68, pulse was 72, respirations 18, and O2 saturation was 97 on room air.  NEUROLOGIC: Constitutionally, she is within normal limits.  She is alert and oriented x3.  Her gait is normal.  Motor strength is intact.  Sensation is intact.  ASSESSMENT: 1. Fibromyalgia. 2. Rotator cuff repair, pain improved after injection. 3. Cervical myofascial pain.  History of the cervical decompression.  PLAN: 1. Refill Flexeril 10 mg 1 p.o. q.8 h p.r.n., 60 with 3 refills. 2. Hydrocodone 5/500 one-half to 1 p.o. b.i.d., 60 with no refill. 3. Fentanyl 50 mcg 1 transdermally every 72 hours, 10 with no refill.     Her questions were encouraged and answered.  She will follow up     here in 1 month. 4. Due to the left-sided neck pain she is having, we did inject her     after informed consent and proper time-out.  I cleansed the left     side just left to the paracervical muscles on the left side of the  cervical spine with alcohol and injected 1 mL of lidocaine at 3     rhomboid points, and 1 trapezius point.  She tolerated it well.     There was no bleeding.  We will see her back in a month.     Juvencio Verdi L. Blima Dessert Electronically Signed    RLW/MedQ D:  05/29/2011 13:01:20  T:  05/30/2011 00:11:53  Job #:  [id number]

## 2011-06-26 ENCOUNTER — Ambulatory Visit (HOSPITAL_COMMUNITY)
Admission: RE | Admit: 2011-06-26 | Discharge: 2011-06-26 | Disposition: A | Discharge status: Home / Self Care | Payer: Medicaid Other | Source: Ambulatory Visit | Attending: Neurosurgery | Admitting: Neurosurgery

## 2011-06-26 ENCOUNTER — Encounter: Payer: Medicaid Other | Attending: Physical Medicine & Rehabilitation | Admitting: Physical Medicine & Rehabilitation

## 2011-06-26 ENCOUNTER — Other Ambulatory Visit: Payer: Self-pay | Admitting: Neurosurgery

## 2011-06-26 DIAGNOSIS — M25529 Pain in unspecified elbow: Secondary | ICD-10-CM

## 2011-06-26 DIAGNOSIS — M47812 Spondylosis without myelopathy or radiculopathy, cervical region: Secondary | ICD-10-CM | POA: Insufficient documentation

## 2011-06-26 DIAGNOSIS — M542 Cervicalgia: Secondary | ICD-10-CM | POA: Insufficient documentation

## 2011-06-26 DIAGNOSIS — R52 Pain, unspecified: Secondary | ICD-10-CM

## 2011-06-26 DIAGNOSIS — M7530 Calcific tendinitis of unspecified shoulder: Secondary | ICD-10-CM

## 2011-06-26 DIAGNOSIS — R51 Headache: Secondary | ICD-10-CM | POA: Insufficient documentation

## 2011-06-26 DIAGNOSIS — IMO0001 Reserved for inherently not codable concepts without codable children: Secondary | ICD-10-CM

## 2011-06-26 NOTE — Assessment & Plan Note (Signed)
Robin Morgan is back regarding her multiple complaints.  She is having more neck pain and headaches recently.  She had trigger point injection with our nurse practitioner at last visit and this provided no relief.  It is on the left paracervical musculature.  Today, her pain is 7-8/10 described as aching.  Neck is most tender with extension and when she lays in the supine, sleep is poor as a result.  She denies any numbness or weakness in the arms.  REVIEW OF SYSTEMS:  Notable for spasms, depression, diarrhea, high sugars.  Full 12-point review is in the written health and history section of the chart.  SOCIAL HISTORY:  Patient is divorced.  PHYSICAL EXAMINATION:  VITAL SIGNS:  Blood pressure is 138/69, pulse 95, respiratory rate 18, she is satting 96% on room air. GENERAL:  Patient is pleasant, alert.  She moves all 4's. MUSCULOSKELETAL:  She had 5/5 strength in both upper extremities with normal sensory exam and 2+ reflexes.  Neck was painful with extension and compression and facet maneuvers to the right.  There is pain to palpation over upper trapezius and paraspinal musculature into the splenius capitis region.  She had some mild tenderness over the occipital region. NEUROLOGIC:  She is alert and appropriate and cognitively intact. HEART:  Regular. CHEST:  Clear. ABDOMEN:  Soft, nontender.  ASSESSMENT: 1. Fibromyalgia. 2. History of rotator cuff repair. 3. Cervicalgia with prior cervical decompression.  PLAN: 1. After informed consent, we injected the right trapezius and     splenius capitis muscles with a 2 mL of 1% lidocaine each. 2. We will send her for x-rays of the cervical spine to assess her     facets and disk spaces. 3. Continue with fentanyl patch 50 mcg q.72h hours #10 and hydrocodone     half to one p.o. b.i.d. #60. 4. 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     Ranelle Oyster, M.D. Electronically Signed    ZTS/MedQ D:  06/26/2011  13:25:33  T:  06/26/2011 19:43:57  Job #:  [id number]

## 2011-07-27 ENCOUNTER — Encounter: Payer: Medicaid Other | Attending: Neurosurgery | Admitting: Neurosurgery

## 2011-07-27 DIAGNOSIS — M25519 Pain in unspecified shoulder: Secondary | ICD-10-CM | POA: Insufficient documentation

## 2011-07-27 DIAGNOSIS — M25529 Pain in unspecified elbow: Secondary | ICD-10-CM

## 2011-07-27 DIAGNOSIS — IMO0001 Reserved for inherently not codable concepts without codable children: Secondary | ICD-10-CM | POA: Insufficient documentation

## 2011-07-27 DIAGNOSIS — M542 Cervicalgia: Secondary | ICD-10-CM

## 2011-07-28 NOTE — Assessment & Plan Note (Signed)
The patient of Dr. Riley Kill seen for multiple pain complaints with shoulder and back pain.  She reports no change in her pain as 7 or 8. She has aching type pain that is constant.  General activity level is 7- 8.  Pain is worse in the morning and night.  Sleep patterns are poor. Pain is worse with standing.  Heat, ice and medication tend to help. She climb steps and drives.  Functionally, she is on disability.  REVIEW OF SYSTEMS:  Notable for difficulties described above as well as some bowel and bladder control issues, spasm, depression.  No suicidal thoughts or aberrant behavior.  Last pill count UDS consistent.  PAST MEDICAL HISTORY, SOCIAL HISTORY AND FAMILY HISTORY:  Unchanged.  PHYSICAL EXAMINATION:  VITAL SIGNS:  Her blood pressure is 133/73, pulse 87, respirations 16 and O2 sats 98 on room air.  EXTREMITIES:  Motor strength and sensation are intact. CONSTITUTIONAL:  She is within normal limits.  She is alert and oriented x3.  Gait is normal.  She states she did have some radiographs completed over cervical spine.  The results are not in the chart.  I will have to look those up and get back to her.  She reports her headaches and neck feel much better after the injections.  She will continue her current medications.  She has fentanyl and hydrocodone which she is getting filled today.  She does not need a prescription and we will see her back in a month.  ASSESSMENT: 1. Fibromyalgia. 2. History of rotator cuff repair. 3. Cervicalgia.  PLAN: 1. She will continue her current medications as prescribed.  No     refills given today 2. Her questions were encouraged and answered.  We will see her back     in a month.     Robin Morgan L. Blima Dessert Electronically Signed    RLW/MedQ D:  07/27/2011 13:16:54  T:  07/28/2011 02:40:46  Job #:  [id number]

## 2011-08-26 ENCOUNTER — Encounter: Payer: Medicaid Other | Attending: Neurosurgery | Admitting: Neurosurgery

## 2011-08-26 DIAGNOSIS — IMO0001 Reserved for inherently not codable concepts without codable children: Secondary | ICD-10-CM

## 2011-08-26 DIAGNOSIS — M25519 Pain in unspecified shoulder: Secondary | ICD-10-CM | POA: Insufficient documentation

## 2011-08-26 DIAGNOSIS — M542 Cervicalgia: Secondary | ICD-10-CM | POA: Insufficient documentation

## 2011-08-26 DIAGNOSIS — M549 Dorsalgia, unspecified: Secondary | ICD-10-CM | POA: Insufficient documentation

## 2011-08-26 DIAGNOSIS — M25529 Pain in unspecified elbow: Secondary | ICD-10-CM

## 2011-08-27 NOTE — Assessment & Plan Note (Signed)
This is a patient of Dr. Riley Kill, seen for multiple pain complaints and shoulder, neck, and back pain.  She reports no change in her pain at 8 or 9.  It is a sharp, aching pain.  General activity level is 7 or 8. Pain is worse in the evening and night.  Sleep patterns are poor.  Ice and medication tend to help.  She is independent.  She does drive. Functionally, she is on disability.  REVIEW OF SYSTEMS:  Notable for difficulties described above as well as some numbness, spasm, depression. No suicidal thoughts or aberrant behaviors.  Last pill count and UDS consistent.  PAST MEDICAL HISTORY, SOCIAL HISTORY, AND FAMILY HISTORY:  Unchanged.  PHYSICAL EXAMINATION:  Her blood pressure 183/84, pulse 90 respirations 16, O2 sats 98 on room air.  Motor strength and sensation are intact. Constitutionally, she is within normal limits.  She is alert and oriented x3.  She has normal gait.  ASSESSMENT: 1. Fibromyalgia. 2. Rotator cuff. 3. History of repair was with chronic pain. 4. Cervicalgia.  PLAN: 1. Refill fentanyl 50 mcg 1 transdermally every 72 hours, 10 with no     refill. 2. Hydrocodone 5/500 one-half to one p.o. b.i.d., 60 with no refill.     Her questions were encouraged and answered.  She will follow up in     a month.     Cally Nygard L. Blima Dessert Electronically Signed    RLW/MedQ D:  08/26/2011 13:11:35  T:  08/27/2011 05:48:45  Job #:  [id number]

## 2011-09-23 ENCOUNTER — Encounter: Payer: Medicaid Other | Attending: Neurosurgery

## 2011-09-23 DIAGNOSIS — M25529 Pain in unspecified elbow: Secondary | ICD-10-CM

## 2011-09-23 DIAGNOSIS — M542 Cervicalgia: Secondary | ICD-10-CM | POA: Insufficient documentation

## 2011-09-23 DIAGNOSIS — M47812 Spondylosis without myelopathy or radiculopathy, cervical region: Secondary | ICD-10-CM

## 2011-09-23 DIAGNOSIS — IMO0001 Reserved for inherently not codable concepts without codable children: Secondary | ICD-10-CM

## 2011-09-23 DIAGNOSIS — M25519 Pain in unspecified shoulder: Secondary | ICD-10-CM | POA: Insufficient documentation

## 2011-09-23 DIAGNOSIS — M549 Dorsalgia, unspecified: Secondary | ICD-10-CM | POA: Insufficient documentation

## 2011-10-28 ENCOUNTER — Encounter: Payer: Medicaid Other | Attending: Neurosurgery | Admitting: Physical Medicine & Rehabilitation

## 2011-10-28 ENCOUNTER — Encounter: Payer: Self-pay | Admitting: Physical Medicine & Rehabilitation

## 2011-10-28 VITALS — BP 124/73 | HR 96 | Resp 18 | Ht 62.0 in | Wt 140.0 lb

## 2011-10-28 DIAGNOSIS — M7918 Myalgia, other site: Secondary | ICD-10-CM | POA: Insufficient documentation

## 2011-10-28 DIAGNOSIS — F32A Depression, unspecified: Secondary | ICD-10-CM

## 2011-10-28 DIAGNOSIS — M719 Bursopathy, unspecified: Secondary | ICD-10-CM | POA: Insufficient documentation

## 2011-10-28 DIAGNOSIS — IMO0001 Reserved for inherently not codable concepts without codable children: Secondary | ICD-10-CM

## 2011-10-28 DIAGNOSIS — F329 Major depressive disorder, single episode, unspecified: Secondary | ICD-10-CM

## 2011-10-28 DIAGNOSIS — M67919 Unspecified disorder of synovium and tendon, unspecified shoulder: Secondary | ICD-10-CM | POA: Insufficient documentation

## 2011-10-28 DIAGNOSIS — F3289 Other specified depressive episodes: Secondary | ICD-10-CM | POA: Insufficient documentation

## 2011-10-28 DIAGNOSIS — F339 Major depressive disorder, recurrent, unspecified: Secondary | ICD-10-CM | POA: Insufficient documentation

## 2011-10-28 MED ORDER — HYDROCODONE-ACETAMINOPHEN 5-500 MG PO TABS: 0.5000 | ORAL_TABLET | Freq: Two times a day (BID) | ORAL | Status: DC

## 2011-10-28 MED ORDER — FENTANYL 50 MCG/HR TD PT72: 1.0000 | MEDICATED_PATCH | TRANSDERMAL | Status: DC

## 2011-10-28 NOTE — Patient Instructions (Signed)
Work on stretching your hamstrings, low back and pelvis
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
## 2011-11-13 ENCOUNTER — Telehealth: Payer: Self-pay | Admitting: *Deleted

## 2011-11-13 MED ORDER — VITAMIN D (ERGOCALCIFEROL) 1.25 MG (50000 UNIT) PO CAPS: 50000.0000 [IU] | ORAL_CAPSULE | ORAL | Status: DC

## 2011-11-13 NOTE — Telephone Encounter (Signed)
Refilled.  Doesn't need otc if taking the rx

## 2011-11-13 NOTE — Telephone Encounter (Signed)
Fax request for refill on Vitamin D2 50,000 units weekly. Last Rx dated 03-05-11. Last lab report 10-22-09 Vit D nl. Looks like she's also taking an OTC supplement. Continue with this RX?

## 2011-12-28 ENCOUNTER — Encounter: Payer: Self-pay | Admitting: Physical Medicine & Rehabilitation

## 2011-12-28 ENCOUNTER — Encounter: Payer: Medicaid Other | Attending: Neurosurgery | Admitting: Physical Medicine & Rehabilitation

## 2011-12-28 VITALS — BP 137/66 | HR 99 | Ht 61.0 in | Wt 139.0 lb

## 2011-12-28 DIAGNOSIS — F329 Major depressive disorder, single episode, unspecified: Secondary | ICD-10-CM

## 2011-12-28 DIAGNOSIS — IMO0001 Reserved for inherently not codable concepts without codable children: Secondary | ICD-10-CM | POA: Insufficient documentation

## 2011-12-28 DIAGNOSIS — M67919 Unspecified disorder of synovium and tendon, unspecified shoulder: Secondary | ICD-10-CM

## 2011-12-28 DIAGNOSIS — M719 Bursopathy, unspecified: Secondary | ICD-10-CM | POA: Insufficient documentation

## 2011-12-28 DIAGNOSIS — F32A Depression, unspecified: Secondary | ICD-10-CM

## 2011-12-28 MED ORDER — FENTANYL 50 MCG/HR TD PT72: 1.0000 | MEDICATED_PATCH | TRANSDERMAL | Status: DC

## 2011-12-28 NOTE — Patient Instructions (Signed)

## 2011-12-28 NOTE — Progress Notes (Signed)
Subjective:    Patient ID: Robin Morgan, female    DOB: 05-Jun-1956, 56 y.o.   MRN: [id number]  HPI  [name] is back regarding her FMS. About 2 weeks ago, she noticed that her low back started to hurt and feels like it's "catching" when she stands up from sitting or gets out of the care.  She tried to help her dad in the yard last week and it was preventing her from participating. She's tried heat and ice which have not helped.  Her pain meds don't help. The robaxin doesn't help but the flexeril does seem to relieve it a bit.   She reports that her mood has gotten better since i last saw her. I referred her to psychology but she couldn't afford the visits.   She has some of her typical fibro pain still, but there is no substantial change with that. The damp weather does affect her pain at times.   Pain Inventory Average Pain 8 Pain Right Now 9 My pain is sharp and aching  In the last 24 hours, has pain interfered with the following? General activity 7 Relation with others 7 Enjoyment of life 7 What TIME of day is your pain at its worst? morning, evening and night Sleep (in general) Fair  Pain is worse with: bending and some activites Pain improves with: medication Relief from Meds: 7  Mobility walk without assistance ability to climb steps?  yes do you drive?  yes  Function disabled: date disabled   Neuro/Psych spasms  Prior Studies Any changes since last visit?  no  Physicians involved in your care Any changes since last visit?  no       Review of Systems  Respiratory: Positive for shortness of breath.   All other systems reviewed and are negative.       Objective:   Physical Exam  Constitutional: She is oriented to person, place, and time. She appears well-developed and well-nourished.  HENT:  Head: Normocephalic and atraumatic.  Eyes: Conjunctivae and EOM are normal. Pupils are equal, round, and reactive to light.  Neck: Normal range of motion.    Cardiovascular: Normal rate, regular rhythm and normal heart sounds.   Pulmonary/Chest: Breath sounds normal. No respiratory distress. She has no wheezes.  Abdominal: Soft. Bowel sounds are normal. She exhibits no distension.  Musculoskeletal:       Lumbar back: She exhibits decreased range of motion and tenderness.       Arms:      Generalized tenderness arms and legs.   Neurological: She is alert and oriented to person, place, and time. She has normal strength and normal reflexes. No sensory deficit.  Psychiatric: She has a normal mood and affect. Her behavior is normal. Judgment and thought content normal.          Assessment & Plan:  ASSESSMENT:  1. Fibromyalgia.  2. Rotator cuff syndrome on the right  3. History of repair was with chronic pain.  4. Cervicalgia.  5. Depression- which has improved 7. Low back pain. Myofascial most likely (some facet signs also on exam)  PLAN:  1. fentanyl 50 mcg 1 transdermally every 72 hours, 10. Still has 7 left from last script, so i gave her a script dated for 6/10 to sync her up with her office visits.  2. Hydrocodone 5/500 one-half to one p.o. b.i.d., 60 was just filled also. 3. Discussed hamstring and pelvic stretching exercises. Low back exercise packet was provided also. She should continue  using modalities to her back as needed. 4. After informed consent i injected the left lower lumbar paraspinals with 2 TPI's consisting of 1% lidocaine. The patient tolerated well.   Her questions were encouraged and answered. She will follow up in 6 weeks.

## 2012-02-08 ENCOUNTER — Encounter: Payer: Medicaid Other | Admitting: Physical Medicine and Rehabilitation

## 2012-02-16 ENCOUNTER — Encounter: Payer: Self-pay | Admitting: Physical Medicine and Rehabilitation

## 2012-02-16 ENCOUNTER — Encounter: Payer: Medicaid Other | Attending: Physical Medicine & Rehabilitation | Admitting: Physical Medicine and Rehabilitation

## 2012-02-16 VITALS — BP 131/80 | HR 107 | Resp 14 | Ht 61.0 in | Wt 136.0 lb

## 2012-02-16 DIAGNOSIS — F3289 Other specified depressive episodes: Secondary | ICD-10-CM | POA: Insufficient documentation

## 2012-02-16 DIAGNOSIS — G8929 Other chronic pain: Secondary | ICD-10-CM | POA: Insufficient documentation

## 2012-02-16 DIAGNOSIS — M797 Fibromyalgia: Secondary | ICD-10-CM

## 2012-02-16 DIAGNOSIS — M542 Cervicalgia: Secondary | ICD-10-CM | POA: Insufficient documentation

## 2012-02-16 DIAGNOSIS — M67919 Unspecified disorder of synovium and tendon, unspecified shoulder: Secondary | ICD-10-CM | POA: Insufficient documentation

## 2012-02-16 DIAGNOSIS — M545 Low back pain, unspecified: Secondary | ICD-10-CM | POA: Insufficient documentation

## 2012-02-16 DIAGNOSIS — F329 Major depressive disorder, single episode, unspecified: Secondary | ICD-10-CM | POA: Insufficient documentation

## 2012-02-16 DIAGNOSIS — IMO0001 Reserved for inherently not codable concepts without codable children: Secondary | ICD-10-CM | POA: Insufficient documentation

## 2012-02-16 DIAGNOSIS — M719 Bursopathy, unspecified: Secondary | ICD-10-CM | POA: Insufficient documentation

## 2012-02-16 MED ORDER — FENTANYL 50 MCG/HR TD PT72: 1.0000 | MEDICATED_PATCH | TRANSDERMAL | Status: DC

## 2012-02-16 MED ORDER — HYDROCODONE-ACETAMINOPHEN 5-500 MG PO TABS: 0.5000 | ORAL_TABLET | Freq: Two times a day (BID) | ORAL | Status: DC

## 2012-02-16 NOTE — Progress Notes (Signed)
Subjective:    Patient ID: Robin Morgan, female    DOB: 1955-09-24, 56 y.o.   MRN: [id number]  HPI The patient complains about chronic pain in shoulder blades bilateral which radiates into her arms bilateral.  The patient also complains about numbness and tingling in entire fingers of left hand. The problem has been stable.  Pain Inventory Average Pain 8 Pain Right Now 7 My pain is constant and aching  In the last 24 hours, has pain interfered with the following? General activity 7 Relation with others 7 Enjoyment of life 8 What TIME of day is your pain at its worst? all the time Sleep (in general) Poor  Pain is worse with: sitting and standing Pain improves with: heat/ice, medication and injections Relief from Meds: 8  Mobility walk without assistance ability to climb steps?  yes do you drive?  yes  Function disabled: date disabled 1998  Neuro/Psych bowel control problems depression  Prior Studies Any changes since last visit?  no  Physicians involved in your care Any changes since last visit?  no   Family History  Problem Relation Age of Onset  . Alzheimer's disease Mother   . Diabetes Father    History   Social History  . Marital Status: Married    Spouse Name: [name]    Number of Children: [name]  . Years of Education: [name]   Social History Main Topics  . Smoking status: Never Smoker   . Smokeless tobacco: None  . Alcohol Use: No  . Drug Use: No  . Sexually Active: None   Other Topics Concern  . None   Social History Narrative  . None   Past Surgical History  Procedure Date  . Abdominal hysterectomy   . Spine surgery   . Cholecystectomy   . Appendectomy   . Tonsillectomy   . Breast surgery     rt breast cyst  . Rotator cuff repair     right   Past Medical History  Diagnosis Date  . Myalgia   . Calcific tendonitis   . Cervical spondylosis without myelopathy   . Depression   . Pain in joint, upper arm   . Diabetes mellitus   .  Hypertension   . Hiatal hernia    BP 131/80  Pulse 107  Resp 14  Ht 5\' 1"  (1.549 m)  Wt 136 lb (61.689 kg)  BMI 25.70 kg/m2  SpO2 99%      Review of Systems  Respiratory: Positive for shortness of breath.   Gastrointestinal: Positive for diarrhea.  Musculoskeletal: Positive for myalgias and arthralgias.  All other systems reviewed and are negative.       Objective:   Physical Exam Constitutional: She is oriented to person, place, and time. She appears well-developed and well-nourished.  HENT:  Head: Normocephalic and atraumatic.  Eyes: Conjunctivae and EOM are normal. Pupils are equal, round, and reactive to light.  Neck: Normal range of motion.  Cardiovascular: Normal rate, regular rhythm and normal heart sounds.  Pulmonary/Chest: Breath sounds normal. No respiratory distress. She has no wheezes.  Abdominal: Soft. Bowel sounds are normal. She exhibits no distension.  Musculoskeletal:  Lumbar back: She exhibits decreased range of motion and tenderness.  Arms: Generalized tenderness arms and legs.  Neurological: She is alert and oriented to person, place, and time. She has normal strength and normal reflexes. No sensory deficit.  Psychiatric: She has a normal mood and affect. Her behavior is normal. Judgment and thought content normal. Kyphotic  T-spine, hyper lordotic C-spine, shoulders protracted        Assessment & Plan:  1. Fibromyalgia.  2. Rotator cuff syndrome on the right  3. History of repair was with chronic pain.  4. Cervicalgia.  5. Depression- which has improved  7. Low back pain. Myofascial most likely (some facet signs also on exam)  PLAN:  1. fentanyl 50 mcg 1 transdermally every 72 hours, 10.   2. Hydrocodone 5/500 one-half to one p.o. b.i.d., 60  3. Discussed shoulder and neck stretching exercises. Explained importance of good posture, showed her some exercises to improve her posture.  She should continue using modalities to her back as needed.  Advised the patient to look into water aerobics.   Her questions were encouraged and answered. She will follow up in 6 weeks.

## 2012-02-16 NOTE — Addendum Note (Signed)
Addended by: Judd Gaudier on: 02/16/2012 03:10 PM   Modules accepted: Orders

## 2012-02-16 NOTE — Patient Instructions (Signed)
Try to keep a good posture, continue with exercises. Advised patient to look into exercising in the pool.

## 2012-03-11 ENCOUNTER — Telehealth: Payer: Self-pay | Admitting: Physical Medicine & Rehabilitation

## 2012-03-11 NOTE — Telephone Encounter (Signed)
[facility] says they have tried numerous times to get PA for Fentanyl.  Please call.

## 2012-03-11 NOTE — Telephone Encounter (Signed)
Pt informed prior Berkley Harvey has been initiated.  Waiting on insurance at this time.

## 2012-03-15 ENCOUNTER — Telehealth: Payer: Self-pay | Admitting: Physical Medicine & Rehabilitation

## 2012-03-15 NOTE — Telephone Encounter (Signed)
Medicaid will not pay for them, we have had to do a prior authorization.

## 2012-03-15 NOTE — Telephone Encounter (Signed)
Is she taking the flexeril tid, if not she could do that until the insurance hopefully approves

## 2012-03-15 NOTE — Telephone Encounter (Signed)
Having problem with pain patch.  Legs throbbing.  Please call.

## 2012-03-15 NOTE — Telephone Encounter (Signed)
authorisation has been initiated , we are waiting for insurance to approve, she has been out of the patches for 7 days now, she should not have withdrawal symptoms any more. I will look whether she is on any anti-inflammatories , will get back to you

## 2012-03-15 NOTE — Telephone Encounter (Signed)
Pt has been without patches since 03/09/12. Is experiecning diarrhea and legs are throbbing. She can't get any relief. Still taking Hydrocodone as prescribed. Please advise.

## 2012-03-15 NOTE — Telephone Encounter (Signed)
Why has she been without patches, has  this happened before?

## 2012-03-16 MED ORDER — MELOXICAM 15 MG PO TABS: 15.0000 mg | ORAL_TABLET | Freq: Every day | ORAL | Status: AC

## 2012-03-16 NOTE — Telephone Encounter (Signed)
Pt states that he is already on Flexeril but she takes it prn because it makes her sleepy.Marland KitchenMarland Kitchen

## 2012-03-16 NOTE — Addendum Note (Signed)
Addended by: Caryl Ada on: 03/16/2012 01:51 PM   Modules accepted: Orders

## 2012-03-16 NOTE — Telephone Encounter (Signed)
Mobic will be sent in for her. Pt aware.

## 2012-03-16 NOTE — Telephone Encounter (Signed)
We could prescribe Mobic 15mg  until she might get approval, please ask whether she can take anti-inflammatories, no ulcers etc.

## 2012-03-25 ENCOUNTER — Encounter
Payer: Medicaid Other | Attending: Physical Medicine and Rehabilitation | Admitting: Physical Medicine and Rehabilitation

## 2012-03-25 ENCOUNTER — Encounter: Payer: Self-pay | Admitting: Physical Medicine and Rehabilitation

## 2012-03-25 VITALS — BP 170/68 | HR 114 | Resp 14 | Ht 61.0 in | Wt 133.0 lb

## 2012-03-25 DIAGNOSIS — M542 Cervicalgia: Secondary | ICD-10-CM | POA: Insufficient documentation

## 2012-03-25 DIAGNOSIS — M545 Low back pain, unspecified: Secondary | ICD-10-CM | POA: Insufficient documentation

## 2012-03-25 DIAGNOSIS — F329 Major depressive disorder, single episode, unspecified: Secondary | ICD-10-CM | POA: Insufficient documentation

## 2012-03-25 DIAGNOSIS — M67919 Unspecified disorder of synovium and tendon, unspecified shoulder: Secondary | ICD-10-CM | POA: Insufficient documentation

## 2012-03-25 DIAGNOSIS — I10 Essential (primary) hypertension: Secondary | ICD-10-CM | POA: Insufficient documentation

## 2012-03-25 DIAGNOSIS — IMO0001 Reserved for inherently not codable concepts without codable children: Secondary | ICD-10-CM | POA: Insufficient documentation

## 2012-03-25 DIAGNOSIS — M797 Fibromyalgia: Secondary | ICD-10-CM

## 2012-03-25 DIAGNOSIS — E119 Type 2 diabetes mellitus without complications: Secondary | ICD-10-CM | POA: Insufficient documentation

## 2012-03-25 DIAGNOSIS — F3289 Other specified depressive episodes: Secondary | ICD-10-CM | POA: Insufficient documentation

## 2012-03-25 DIAGNOSIS — M25519 Pain in unspecified shoulder: Secondary | ICD-10-CM | POA: Insufficient documentation

## 2012-03-25 DIAGNOSIS — M719 Bursopathy, unspecified: Secondary | ICD-10-CM | POA: Insufficient documentation

## 2012-03-25 DIAGNOSIS — R252 Cramp and spasm: Secondary | ICD-10-CM | POA: Insufficient documentation

## 2012-03-25 DIAGNOSIS — R209 Unspecified disturbances of skin sensation: Secondary | ICD-10-CM | POA: Insufficient documentation

## 2012-03-25 MED ORDER — HYDROCODONE-ACETAMINOPHEN 5-500 MG PO TABS: 0.5000 | ORAL_TABLET | Freq: Two times a day (BID) | ORAL | Status: DC

## 2012-03-25 MED ORDER — GABAPENTIN 300 MG PO CAPS: 300.0000 mg | ORAL_CAPSULE | Freq: Three times a day (TID) | ORAL | Status: DC

## 2012-03-25 NOTE — Progress Notes (Signed)
Subjective:    Patient ID: Robin Morgan, female    DOB: Oct 01, 1955, 56 y.o.   MRN: [id number]  HPI The patient complains about chronic pain in shoulder blades bilateral which radiates into her arms bilateral. The patient also complains about numbness and tingling in entire fingers of left hand.  The problem has been stable. Patient also complains about intermittent cramping pain in her quadriceps, bilateral.  Pain Inventory Average Pain 9 Pain Right Now 8 My pain is constant and aching  In the last 24 hours, has pain interfered with the following? General activity 10 Relation with others 9 Enjoyment of life 9 What TIME of day is your pain at its worst? all the time Sleep (in general) Poor  Pain is worse with: walking, bending, sitting, inactivity, standing and some activites Pain improves with: medication Relief from Meds: 3  Mobility ability to climb steps?  yes do you drive?  yes  Function disabled: date disabled   Neuro/Psych bowel control problems spasms  Prior Studies Any changes since last visit?  no  Physicians involved in your care Any changes since last visit?  no   Family History  Problem Relation Age of Onset  . Alzheimer's disease Mother   . Diabetes Father    History   Social History  . Marital Status: Married    Spouse Name: [name]    Number of Children: [name]  . Years of Education: [name]   Social History Main Topics  . Smoking status: Never Smoker   . Smokeless tobacco: None  . Alcohol Use: No  . Drug Use: No  . Sexually Active: None   Other Topics Concern  . None   Social History Narrative  . None   Past Surgical History  Procedure Date  . Abdominal hysterectomy   . Spine surgery   . Cholecystectomy   . Appendectomy   . Tonsillectomy   . Breast surgery     rt breast cyst  . Rotator cuff repair     right   Past Medical History  Diagnosis Date  . Myalgia   . Calcific tendonitis   . Cervical spondylosis without myelopathy     . Depression   . Pain in joint, upper arm   . Diabetes mellitus   . Hypertension   . Hiatal hernia    BP 170/68  Pulse 114  Resp 14  Ht 5\' 1"  (1.549 m)  Wt 133 lb (60.328 kg)  BMI 25.13 kg/m2  SpO2 98%     Review of Systems  Gastrointestinal: Positive for diarrhea.  Musculoskeletal: Positive for myalgias, arthralgias and gait problem.  All other systems reviewed and are negative.       Objective:   Physical Exam Constitutional: She is oriented to person, place, and time. She appears well-developed and well-nourished.  HENT:  Head: Normocephalic and atraumatic.  Eyes: Conjunctivae and EOM are normal. Pupils are equal, round, and reactive to light.  Neck: Normal range of motion.  Cardiovascular: Normal rate, regular rhythm and normal heart sounds.  Pulmonary/Chest: Breath sounds normal. No respiratory distress. She has no wheezes.  Abdominal: Soft. Bowel sounds are normal. She exhibits no distension.  Musculoskeletal:  Lumbar back: She exhibits decreased range of motion and tenderness.  Arms: Generalized tenderness arms and legs.  Neurological: She is alert and oriented to person, place, and time. She has normal strength and normal reflexes. No sensory deficit.  Psychiatric: She has a normal mood and affect. Her behavior is normal. Judgment and  thought content normal.  Kyphotic T-spine, hyper lordotic C-spine, shoulders protracted        Assessment & Plan:  1. Fibromyalgia.  2. Rotator cuff syndrome on the right  3. History of repair was with chronic pain.  4. Cervicalgia.  5. Depression- which has improved  7. Low back pain. Myofascial most likely  PLAN:  1. Refilled gabapentin 300mg  2. Hydrocodone 5/500 one-half to one p.o. b.i.d., 60  3. Discussed shoulder and neck stretching exercises. Explained importance of good posture, showed her some exercises to improve her posture. She should continue using modalities to her back as needed. Advised the patient to  look into water aerobics, if possible.  Also advised patient to talk to her PCP, about the possibility of her Statins being the cause for some of her muscle pain, and the option of taking Q-10 for these pains. Her questions were encouraged and answered. She will follow up in 6 weeks.

## 2012-03-25 NOTE — Patient Instructions (Signed)
Continue with stretching exercises, continue to keep a good posture.

## 2012-05-04 ENCOUNTER — Encounter: Payer: Self-pay | Admitting: Physical Medicine and Rehabilitation

## 2012-05-04 ENCOUNTER — Encounter
Payer: Medicaid Other | Attending: Physical Medicine and Rehabilitation | Admitting: Physical Medicine and Rehabilitation

## 2012-05-04 VITALS — BP 108/42 | HR 60 | Resp 16 | Ht 61.0 in | Wt 133.0 lb

## 2012-05-04 DIAGNOSIS — F329 Major depressive disorder, single episode, unspecified: Secondary | ICD-10-CM | POA: Insufficient documentation

## 2012-05-04 DIAGNOSIS — F3289 Other specified depressive episodes: Secondary | ICD-10-CM | POA: Insufficient documentation

## 2012-05-04 DIAGNOSIS — M545 Low back pain, unspecified: Secondary | ICD-10-CM | POA: Insufficient documentation

## 2012-05-04 DIAGNOSIS — IMO0001 Reserved for inherently not codable concepts without codable children: Secondary | ICD-10-CM | POA: Insufficient documentation

## 2012-05-04 DIAGNOSIS — M67919 Unspecified disorder of synovium and tendon, unspecified shoulder: Secondary | ICD-10-CM | POA: Insufficient documentation

## 2012-05-04 DIAGNOSIS — G8929 Other chronic pain: Secondary | ICD-10-CM | POA: Insufficient documentation

## 2012-05-04 DIAGNOSIS — M797 Fibromyalgia: Secondary | ICD-10-CM

## 2012-05-04 DIAGNOSIS — M719 Bursopathy, unspecified: Secondary | ICD-10-CM | POA: Insufficient documentation

## 2012-05-04 DIAGNOSIS — M542 Cervicalgia: Secondary | ICD-10-CM | POA: Insufficient documentation

## 2012-05-04 MED ORDER — ZOLPIDEM TARTRATE 10 MG PO TABS: 10.0000 mg | ORAL_TABLET | Freq: Every evening | ORAL | Status: DC | PRN

## 2012-05-04 MED ORDER — HYDROCODONE-ACETAMINOPHEN 5-500 MG PO TABS: 0.5000 | ORAL_TABLET | Freq: Two times a day (BID) | ORAL | Status: DC

## 2012-05-04 NOTE — Progress Notes (Signed)
Subjective:    Patient ID: Robin Morgan, female    DOB: [birth year], 56 y.o.   MRN: [id number]  HPI The patient complains about chronic pain in shoulder blades bilateral which radiates into her arms bilateral. The patient also complains about numbness and tingling in entire fingers of left hand.  The problem has been stable. Patient also complains about muscle pain/ cramping pain in her neck , bilateral.  Pain Inventory Average Pain 9 Pain Right Now 8 My pain is constant, sharp and aching  In the last 24 hours, has pain interfered with the following? General activity 8 Relation with others 8 Enjoyment of life 9 What TIME of day is your pain at its worst? evening Sleep (in general) Poor  Pain is worse with: sitting Pain improves with: medication Relief from Meds: 4  Mobility ability to climb steps?  yes do you drive?  yes  Function disabled: date disabled   Neuro/Psych bladder control problems bowel control problems numbness tingling spasms  Prior Studies Any changes since last visit?  no  Physicians involved in your care Any changes since last visit?  no   Family History  Problem Relation Age of Onset  . Alzheimer's disease Mother   . Diabetes Father    History   Social History  . Marital Status: Married    Spouse Name: [name]    Number of Children: [name]  . Years of Education: [name]   Social History Main Topics  . Smoking status: Never Smoker   . Smokeless tobacco: None  . Alcohol Use: No  . Drug Use: No  . Sexually Active: None   Other Topics Concern  . None   Social History Narrative  . None   Past Surgical History  Procedure Date  . Abdominal hysterectomy   . Spine surgery   . Cholecystectomy   . Appendectomy   . Tonsillectomy   . Breast surgery     rt breast cyst  . Rotator cuff repair     right   Past Medical History  Diagnosis Date  . Myalgia   . Calcific tendonitis   . Cervical spondylosis without myelopathy   . Depression     . Pain in joint, upper arm   . Diabetes mellitus   . Hypertension   . Hiatal hernia    BP 108/42  Pulse 60  Resp 16  Ht 5\' 1"  (1.549 m)  Wt 133 lb (60.328 kg)  BMI 25.13 kg/m2  SpO2 97%      Review of Systems  Constitutional: Negative.   HENT: Negative.   Eyes: Negative.   Respiratory: Negative.   Cardiovascular: Negative.   Gastrointestinal: Negative.   Genitourinary: Negative.   Musculoskeletal: Positive for myalgias and arthralgias.  Skin: Negative.   Neurological: Negative.   Hematological: Negative.   Psychiatric/Behavioral: Negative.        Objective:   Physical Exam Constitutional: She is oriented to person, place, and time. She appears well-developed and well-nourished.  HENT:  Head: Normocephalic and atraumatic.  Eyes: Conjunctivae and EOM are normal. Pupils are equal, round, and reactive to light.  Neck: Normal range of motion.  Cardiovascular: Normal rate, regular rhythm and normal heart sounds.  Pulmonary/Chest: Breath sounds normal. No respiratory distress. She has no wheezes.  Abdominal: Soft. Bowel sounds are normal. She exhibits no distension.  Musculoskeletal:  Lumbar back: She exhibits decreased range of motion and tenderness.  Arms: Generalized tenderness arms and legs.  Neurological: She is alert and oriented to  person, place, and time. She has normal strength and normal reflexes. No sensory deficit.  Psychiatric: She has a normal mood and affect. Her behavior is normal. Judgment and thought content normal.  Kyphotic T-spine, hyper lordotic C-spine, shoulders protracted. Tenderness, increase muscle tone in descending trapezius and levator scapulae bilateral.        Assessment & Plan:  1. Fibromyalgia.  2. Rotator cuff syndrome on the right  3. History of repair was with chronic pain.  4. Cervicalgia.  5. Depression- which has improved  7. Low back pain. Myofascial most likely  PLAN:  1. Refilled gabapentin 300mg   2. Hydrocodone  5/500 one-half to one p.o. b.i.d., 60  3. Discussed shoulder and neck stretching exercises. Explained importance of good posture, showed her some exercises to improve her posture. Patient should continue with stretching exercises and walking program. Also advised patient to talk to her PCP, about the possibility of her Statins being the cause for some of her muscle pain, and the option of taking Q-10 for these pains, again.  Her questions were encouraged and answered. She will follow up in 4-6 weeks.

## 2012-05-04 NOTE — Patient Instructions (Signed)
Continue with staying active, and doing exercises, and correcting your posture. Ask your PCP whether you could take Q-10 to prevent some muscle pain which might come from taking a statin.

## 2012-05-11 ENCOUNTER — Other Ambulatory Visit: Payer: Self-pay | Admitting: Family Medicine

## 2012-05-11 DIAGNOSIS — Z1231 Encounter for screening mammogram for malignant neoplasm of breast: Secondary | ICD-10-CM

## 2012-05-11 DIAGNOSIS — Z780 Asymptomatic menopausal state: Secondary | ICD-10-CM

## 2012-06-02 ENCOUNTER — Encounter
Payer: Medicaid Other | Attending: Physical Medicine and Rehabilitation | Admitting: Physical Medicine and Rehabilitation

## 2012-06-02 ENCOUNTER — Encounter: Payer: Self-pay | Admitting: Physical Medicine and Rehabilitation

## 2012-06-02 VITALS — BP 131/84 | HR 90 | Resp 14 | Ht 61.0 in | Wt 138.0 lb

## 2012-06-02 DIAGNOSIS — M25519 Pain in unspecified shoulder: Secondary | ICD-10-CM | POA: Insufficient documentation

## 2012-06-02 DIAGNOSIS — F329 Major depressive disorder, single episode, unspecified: Secondary | ICD-10-CM | POA: Insufficient documentation

## 2012-06-02 DIAGNOSIS — M549 Dorsalgia, unspecified: Secondary | ICD-10-CM

## 2012-06-02 DIAGNOSIS — F3289 Other specified depressive episodes: Secondary | ICD-10-CM | POA: Insufficient documentation

## 2012-06-02 DIAGNOSIS — M67919 Unspecified disorder of synovium and tendon, unspecified shoulder: Secondary | ICD-10-CM | POA: Insufficient documentation

## 2012-06-02 DIAGNOSIS — M542 Cervicalgia: Secondary | ICD-10-CM | POA: Insufficient documentation

## 2012-06-02 DIAGNOSIS — IMO0001 Reserved for inherently not codable concepts without codable children: Secondary | ICD-10-CM | POA: Insufficient documentation

## 2012-06-02 DIAGNOSIS — E119 Type 2 diabetes mellitus without complications: Secondary | ICD-10-CM | POA: Insufficient documentation

## 2012-06-02 DIAGNOSIS — M719 Bursopathy, unspecified: Secondary | ICD-10-CM | POA: Insufficient documentation

## 2012-06-02 DIAGNOSIS — I10 Essential (primary) hypertension: Secondary | ICD-10-CM | POA: Insufficient documentation

## 2012-06-02 DIAGNOSIS — R209 Unspecified disturbances of skin sensation: Secondary | ICD-10-CM | POA: Insufficient documentation

## 2012-06-02 DIAGNOSIS — M75100 Unspecified rotator cuff tear or rupture of unspecified shoulder, not specified as traumatic: Secondary | ICD-10-CM

## 2012-06-02 DIAGNOSIS — M545 Low back pain, unspecified: Secondary | ICD-10-CM | POA: Insufficient documentation

## 2012-06-02 MED ORDER — ZOLPIDEM TARTRATE 10 MG PO TABS: 10.0000 mg | ORAL_TABLET | Freq: Every evening | ORAL | Status: DC | PRN

## 2012-06-02 MED ORDER — HYDROCODONE-ACETAMINOPHEN 5-500 MG PO TABS: 0.5000 | ORAL_TABLET | Freq: Two times a day (BID) | ORAL | Status: DC

## 2012-06-02 NOTE — Patient Instructions (Signed)
Continue with posture training, exercises and stretches as tolerated.

## 2012-06-02 NOTE — Progress Notes (Signed)
Subjective:    Patient ID: Robin Morgan, female    DOB: 1956/06/26, 56 y.o.   MRN: [id number]  HPI The patient complains about chronic pain in shoulder blades bilateral which radiates into her arms bilateral. The patient also complains about numbness and tingling in entire fingers of left hand, and back pain. The problem has been stable. Although she complains about a little more pain in the cooler weather, and if she stays in one position for too long.  Pain Inventory Average Pain 8 Pain Right Now 7 My pain is constant, dull, tingling and aching  In the last 24 hours, has pain interfered with the following? General activity 5 Relation with others 5 Enjoyment of life 8 What TIME of day is your pain at its worst? night Sleep (in general) Poor  Pain is worse with: walking, bending, sitting, standing and some activites Pain improves with: rest, heat/ice and medication Relief from Meds: 7  Mobility walk without assistance how many minutes can you walk? 10 ability to climb steps?  yes do you drive?  yes Do you have any goals in this area?  yes  Function disabled: date disabled 10 I need assistance with the following:  household duties  Neuro/Psych bladder control problems bowel control problems weakness spasms depression  Prior Studies Any changes since last visit?  no  Physicians involved in your care Any changes since last visit?  no   Family History  Problem Relation Age of Onset  . Alzheimer's disease Mother   . Diabetes Father    History   Social History  . Marital Status: Married    Spouse Name: [name]    Number of Children: [name]  . Years of Education: [name]   Social History Main Topics  . Smoking status: Never Smoker   . Smokeless tobacco: None  . Alcohol Use: No  . Drug Use: No  . Sexually Active: None   Other Topics Concern  . None   Social History Narrative  . None   Past Surgical History  Procedure Date  . Abdominal hysterectomy   .  Spine surgery   . Cholecystectomy   . Appendectomy   . Tonsillectomy   . Breast surgery     rt breast cyst  . Rotator cuff repair     right   Past Medical History  Diagnosis Date  . Myalgia   . Calcific tendonitis   . Cervical spondylosis without myelopathy   . Depression   . Pain in joint, upper arm   . Diabetes mellitus   . Hypertension   . Hiatal hernia    BP 131/84  Pulse 90  Resp 14  Ht 5\' 1"  (1.549 m)  Wt 138 lb (62.596 kg)  BMI 26.07 kg/m2  SpO2 96%     Review of Systems  Gastrointestinal: Positive for diarrhea.  Musculoskeletal: Positive for myalgias and arthralgias.  Neurological: Positive for weakness.  Psychiatric/Behavioral: Positive for dysphoric mood.  All other systems reviewed and are negative.       Objective:   Physical Exam Constitutional: She is oriented to person, place, and time. She appears well-developed and well-nourished.  HENT:  Head: Normocephalic and atraumatic.  Eyes: Conjunctivae and EOM are normal. Pupils are equal, round, and reactive to light.  Neck: Normal range of motion.  Cardiovascular: Normal rate, regular rhythm and normal heart sounds.  Pulmonary/Chest: Breath sounds normal. No respiratory distress. She has no wheezes.  Abdominal: Soft. Bowel sounds are normal. She exhibits no distension.  Musculoskeletal:  Lumbar back: She exhibits decreased range of motion and tenderness.  Arms: Generalized tenderness arms and legs.  Neurological: She is alert and oriented to person, place, and time. She has normal strength and normal reflexes. No sensory deficit.  Psychiatric: She has a normal mood and affect. Her behavior is normal. Judgment and thought content normal.  Kyphotic T-spine, hyper lordotic C-spine, shoulders protracted. Tenderness, increase muscle tone in descending trapezius and levator scapulae bilateral.        Assessment & Plan:  1. Fibromyalgia.  2. Rotator cuff syndrome on the right  3. History of repair  was with chronic pain.  4. Cervicalgia.  5. Depression- which has improved  7. Low back pain. Myofascial most likely  PLAN:  1. Refilled gabapentin 300mg   2. Hydrocodone 5/500 one-half to one p.o. b.i.d., 60 with one refill. 3. Discussed shoulder and neck stretching exercises. Explained importance of good posture, showed her some exercises to improve her posture. Patient should continue with stretching exercises and walking program. Ordered PT with modalities for pain relief . Her questions were encouraged and answered. She will follow up in 2 month.

## 2012-06-08 ENCOUNTER — Ambulatory Visit: Payer: Medicaid Other

## 2012-06-08 ENCOUNTER — Other Ambulatory Visit: Payer: Medicaid Other

## 2012-07-04 DIAGNOSIS — K529 Noninfective gastroenteritis and colitis, unspecified: Secondary | ICD-10-CM | POA: Insufficient documentation

## 2012-08-08 ENCOUNTER — Encounter
Payer: Medicaid Other | Attending: Physical Medicine and Rehabilitation | Admitting: Physical Medicine and Rehabilitation

## 2012-08-08 ENCOUNTER — Encounter: Payer: Self-pay | Admitting: Physical Medicine and Rehabilitation

## 2012-08-08 VITALS — BP 155/80 | HR 103 | Resp 14 | Ht 61.0 in | Wt 142.0 lb

## 2012-08-08 DIAGNOSIS — M25519 Pain in unspecified shoulder: Secondary | ICD-10-CM | POA: Insufficient documentation

## 2012-08-08 DIAGNOSIS — R209 Unspecified disturbances of skin sensation: Secondary | ICD-10-CM | POA: Insufficient documentation

## 2012-08-08 DIAGNOSIS — F3289 Other specified depressive episodes: Secondary | ICD-10-CM | POA: Insufficient documentation

## 2012-08-08 DIAGNOSIS — M67919 Unspecified disorder of synovium and tendon, unspecified shoulder: Secondary | ICD-10-CM | POA: Insufficient documentation

## 2012-08-08 DIAGNOSIS — F329 Major depressive disorder, single episode, unspecified: Secondary | ICD-10-CM | POA: Insufficient documentation

## 2012-08-08 DIAGNOSIS — M542 Cervicalgia: Secondary | ICD-10-CM | POA: Insufficient documentation

## 2012-08-08 DIAGNOSIS — IMO0001 Reserved for inherently not codable concepts without codable children: Secondary | ICD-10-CM

## 2012-08-08 DIAGNOSIS — M797 Fibromyalgia: Secondary | ICD-10-CM

## 2012-08-08 DIAGNOSIS — M719 Bursopathy, unspecified: Secondary | ICD-10-CM | POA: Insufficient documentation

## 2012-08-08 DIAGNOSIS — Z5181 Encounter for therapeutic drug level monitoring: Secondary | ICD-10-CM

## 2012-08-08 DIAGNOSIS — M545 Low back pain, unspecified: Secondary | ICD-10-CM | POA: Insufficient documentation

## 2012-08-08 MED ORDER — HYDROCODONE-ACETAMINOPHEN 5-500 MG PO TABS: 0.5000 | ORAL_TABLET | Freq: Two times a day (BID) | ORAL | Status: DC

## 2012-08-08 NOTE — Progress Notes (Signed)
Subjective:    Patient ID: Robin Morgan, female    DOB: 07/21/1956, 56 y.o.   MRN: [id number]  HPI The patient complains about chronic pain in shoulder blades bilateral which radiates into her arms bilateral. The patient also complains about numbness and tingling in entire fingers of left hand, and back pain.  The problem has been stable. Although she complains about a little more pain in the cooler weather, and if she stays in one position for too long.  Pain Inventory Average Pain 8 Pain Right Now 9 My pain is intermittent, sharp and stabbing  In the last 24 hours, has pain interfered with the following? General activity 8 Relation with others 2 Enjoyment of life 8 What TIME of day is your pain at its worst? evening Sleep (in general) Poor  Pain is worse with: sitting and standing Pain improves with: medication Relief from Meds: 8  Mobility walk without assistance how many minutes can you walk? 10 ability to climb steps?  yes do you drive?  yes transfers alone Do you have any goals in this area?  yes  Function disabled: date disabled 1999 Do you have any goals in this area?  yes  Neuro/Psych spasms  Prior Studies Any changes since last visit?  no  Physicians involved in your care Any changes since last visit?  no   Family History  Problem Relation Age of Onset  . Alzheimer's disease Mother   . Diabetes Father    History   Social History  . Marital Status: Married    Spouse Name: [name]    Number of Children: [name]  . Years of Education: [name]   Social History Main Topics  . Smoking status: Never Smoker   . Smokeless tobacco: None  . Alcohol Use: No  . Drug Use: No  . Sexually Active: None   Other Topics Concern  . None   Social History Narrative  . None   Past Surgical History  Procedure Date  . Abdominal hysterectomy   . Spine surgery   . Cholecystectomy   . Appendectomy   . Tonsillectomy   . Breast surgery     rt breast cyst  . Rotator  cuff repair     right   Past Medical History  Diagnosis Date  . Myalgia   . Calcific tendonitis   . Cervical spondylosis without myelopathy   . Depression   . Pain in joint, upper arm   . Diabetes mellitus   . Hypertension   . Hiatal hernia    BP 155/80  Pulse 103  Resp 14  Ht 5\' 1"  (1.549 m)  Wt 142 lb (64.411 kg)  BMI 26.83 kg/m2  SpO2 95%    Review of Systems  Gastrointestinal: Positive for diarrhea.  Musculoskeletal: Positive for myalgias and arthralgias.  Neurological: Positive for headaches.       Spasms   All other systems reviewed and are negative.       Objective:   Physical Exam Constitutional: She is oriented to person, place, and time. She appears well-developed and well-nourished.  HENT:  Head: Normocephalic and atraumatic.  Eyes: Conjunctivae and EOM are normal. Pupils are equal, round, and reactive to light.  Neck: Normal range of motion.  Cardiovascular: Normal rate, regular rhythm and normal heart sounds.  Pulmonary/Chest: Breath sounds normal. No respiratory distress. She has no wheezes.  Abdominal: Soft. Bowel sounds are normal. She exhibits no distension.  Musculoskeletal: Strength grossly intact. Lumbar back: She exhibits decreased range  of motion and tenderness.  Arms: Generalized tenderness arms and legs.  Neurological: She is alert and oriented to person, place, and time. She has normal strength and normal reflexes. No sensory deficit.  Psychiatric: She has a normal mood and affect. Her behavior is normal. Judgment and thought content normal.  Kyphotic T-spine, hyper lordotic C-spine, shoulders protracted. Tenderness, increase muscle tone in descending trapezius and levator scapulae bilateral.        Assessment & Plan:  1. Fibromyalgia.  2. Rotator cuff syndrome on the right  3. History of repair was with chronic pain.  4. Cervicalgia.  5. Depression- which has improved  7. Low back pain. Myofascial most likely  PLAN:  1.  Refilled gabapentin 300mg  , patient has onlytaken 2 /day, advised patient to take 3 per day, to help with some increased BP. Also advised patient to take her muscle relaxant, as prescribed on the days when she has more muscle pain. 2. Hydrocodone 5/500 one-half to one p.o. b.i.d., 60 with one refill.  3. Discussed shoulder and neck stretching exercises. Explained importance of good posture, showed her some exercises to improve her posture. Patient should continue with stretching exercises and walking program. Ordered PT with modalities for pain relief, patient has not started this , because of financial issues .  Her questions were encouraged and answered. She will follow up in 2 month.

## 2012-08-08 NOTE — Patient Instructions (Signed)
Try to stay as active as pain allows. 

## 2012-08-26 ENCOUNTER — Telehealth: Payer: Self-pay | Admitting: *Deleted

## 2012-08-26 NOTE — Telephone Encounter (Signed)
She could try melantonin OTC, Bedtime tea, from yogi, hot bath, walking in the late afternoon,she could follow up with a sleep specialist, to learn a good sleep hygiene

## 2012-08-26 NOTE — Telephone Encounter (Signed)
Patient states she is having a hard time sleeping. She had been prescribed Ambien but was advised during last visit that she should not be taking it every night. Patient states if she does not take it then she is up all night. Would like to know what else she can take or do to help?

## 2012-08-29 NOTE — Telephone Encounter (Signed)
No answer. Will try calling later.

## 2012-08-30 NOTE — Telephone Encounter (Signed)
Notified patient.

## 2012-10-03 ENCOUNTER — Ambulatory Visit: Payer: Medicaid Other | Admitting: Physical Medicine and Rehabilitation

## 2012-10-07 ENCOUNTER — Encounter: Payer: Self-pay | Admitting: Physical Medicine and Rehabilitation

## 2012-10-07 ENCOUNTER — Encounter
Payer: Medicaid Other | Attending: Physical Medicine and Rehabilitation | Admitting: Physical Medicine and Rehabilitation

## 2012-10-07 VITALS — BP 138/70 | HR 114 | Resp 14 | Ht 61.0 in | Wt 144.0 lb

## 2012-10-07 DIAGNOSIS — IMO0001 Reserved for inherently not codable concepts without codable children: Secondary | ICD-10-CM

## 2012-10-07 DIAGNOSIS — M67919 Unspecified disorder of synovium and tendon, unspecified shoulder: Secondary | ICD-10-CM | POA: Insufficient documentation

## 2012-10-07 DIAGNOSIS — M719 Bursopathy, unspecified: Secondary | ICD-10-CM | POA: Insufficient documentation

## 2012-10-07 DIAGNOSIS — G8929 Other chronic pain: Secondary | ICD-10-CM | POA: Insufficient documentation

## 2012-10-07 DIAGNOSIS — M545 Low back pain, unspecified: Secondary | ICD-10-CM | POA: Insufficient documentation

## 2012-10-07 DIAGNOSIS — M542 Cervicalgia: Secondary | ICD-10-CM | POA: Insufficient documentation

## 2012-10-07 MED ORDER — CYCLOBENZAPRINE HCL 10 MG PO TABS: 10.0000 mg | ORAL_TABLET | Freq: Three times a day (TID) | ORAL | Status: DC | PRN

## 2012-10-07 MED ORDER — HYDROCODONE-ACETAMINOPHEN 5-500 MG PO TABS: 0.5000 | ORAL_TABLET | Freq: Two times a day (BID) | ORAL | Status: DC

## 2012-10-07 NOTE — Progress Notes (Signed)
Subjective:    Patient ID: Robin Morgan, female    DOB: [birth year], 57 y.o.   MRN: [id number]  HPI HPI  The patient complains about chronic pain in shoulder blades bilateral which radiates into her arms bilateral. The patient also complains about numbness and tingling in entire fingers of left hand, and back pain.  The problem has been stable. Although she complains about a little more pain in the cooler weather, and if she stays in one position for too long.  Pain Inventory Average Pain 8 Pain Right Now 8 My pain is constant, sharp, stabbing, tingling and aching  In the last 24 hours, has pain interfered with the following? General activity 8 Relation with others 8 Enjoyment of life 8 What TIME of day is your pain at its worst? morning, day and night Sleep (in general) Fair  Pain is worse with: sitting and standing Pain improves with: medication and injections Relief from Meds: 8  Mobility ability to climb steps?  yes do you drive?  yes  Function disabled: date disabled see chart  Neuro/Psych bladder control problems bowel control problems spasms depression  Prior Studies Any changes since last visit?  no  Physicians involved in your care Any changes since last visit?  no   Family History  Problem Relation Age of Onset  . Alzheimer's disease Mother   . Diabetes Father    History   Social History  . Marital Status: Married    Spouse Name: [name]    Number of Children: [name]  . Years of Education: [name]   Social History Main Topics  . Smoking status: Never Smoker   . Smokeless tobacco: None  . Alcohol Use: No  . Drug Use: No  . Sexually Active: None   Other Topics Concern  . None   Social History Narrative  . None   Past Surgical History  Procedure Laterality Date  . Abdominal hysterectomy    . Spine surgery    . Cholecystectomy    . Appendectomy    . Tonsillectomy    . Breast surgery      rt breast cyst  . Rotator cuff repair      right    Past Medical History  Diagnosis Date  . Myalgia   . Calcific tendonitis   . Cervical spondylosis without myelopathy   . Depression   . Pain in joint, upper arm   . Diabetes mellitus   . Hypertension   . Hiatal hernia    BP 138/70  Pulse 114  Resp 14  Ht 5\' 1"  (1.549 m)  Wt 144 lb (65.318 kg)  BMI 27.22 kg/m2  SpO2 97%     Review of Systems  Gastrointestinal: Positive for diarrhea.  Psychiatric/Behavioral: Positive for dysphoric mood.  All other systems reviewed and are negative.       Objective:   Physical Exam Constitutional: She is oriented to person, place, and time. She appears well-developed and well-nourished.  HENT:  Head: Normocephalic and atraumatic.  Eyes: Conjunctivae and EOM are normal. Pupils are equal, round, and reactive to light.  Neck: Normal range of motion.  Cardiovascular: Normal rate, regular rhythm and normal heart sounds.  Pulmonary/Chest: Breath sounds normal. No respiratory distress. She has no wheezes.  Abdominal: Soft. Bowel sounds are normal. She exhibits no distension.  Musculoskeletal: Strength grossly intact. Lumbar back: She exhibits decreased range of motion and tenderness.  Arms: Generalized tenderness arms and legs.  Neurological: She is alert and oriented to person,  place, and time. She has normal strength and normal reflexes. No sensory deficit.  Psychiatric: She has a normal mood and affect. Her behavior is normal. Judgment and thought content normal.  Kyphotic T-spine, hyper lordotic C-spine, shoulders protracted. Tenderness, increase muscle tone in descending trapezius and levator scapulae bilateral.        Assessment & Plan:  1. Fibromyalgia.  2. Rotator cuff syndrome on the right  3. History of repair was with chronic pain.  4. Cervicalgia.  5. Depression- which has improved  7. Low back pain. Myofascial most likely  PLAN:  1. Refilled gabapentin 300mg  , patient has only taken 2 /day, advised patient to take 3  per day, to help with some increased  Mid Back Pain and radiating symptoms.  Also advised patient to take her muscle relaxant, as prescribed on the days when she has more muscle pain.  2. Hydrocodone 5/325 one-half to one p.o. b.i.d., 60 with one refill.  3. Discussed shoulder and neck stretching exercises. Explained importance of good posture, showed her some exercises to improve her posture. Patient should continue with stretching exercises and walking program. Ordered PT with modalities for pain relief, patient has not started this , because of financial issues .  Her questions were encouraged and answered. She will follow up in 2 month with Dr. Riley Kill.

## 2012-10-07 NOTE — Patient Instructions (Signed)
Continue with your exercise and stretching program. 

## 2012-10-17 ENCOUNTER — Other Ambulatory Visit: Payer: Self-pay | Admitting: Physical Medicine and Rehabilitation

## 2012-11-01 ENCOUNTER — Telehealth: Payer: Self-pay

## 2012-11-01 NOTE — Telephone Encounter (Signed)
Patient called and is concerned that she is getting a lower dose of hydrocodone and it does not work as well.  She would like a callback.

## 2012-11-02 NOTE — Telephone Encounter (Signed)
Attempted to reach Ms Ege to answer questions but she does not have voice mail on either home or mobile phone. This is the 2nd attempt to reach the patient and no way to make contact. If further questions then she will have to contact us.

## 2012-11-02 NOTE — Telephone Encounter (Signed)
Tried contacting patient. Unable to leave message.

## 2012-11-04 ENCOUNTER — Telehealth: Payer: Self-pay | Admitting: Physical Medicine & Rehabilitation

## 2012-11-04 NOTE — Telephone Encounter (Signed)
Patient advised to take a tylenol with her hydrocodone since the dose has changed.  Informed her if this does not help she will need to be seen.  Patient agreed.

## 2012-11-04 NOTE — Telephone Encounter (Signed)
Patient came to window about her medication.  Please call her on her house phone.  Her cell phone is out of minutes.

## 2012-11-23 ENCOUNTER — Ambulatory Visit
Admission: RE | Admit: 2012-11-23 | Discharge: 2012-11-23 | Disposition: A | Discharge status: Home / Self Care | Payer: Medicaid Other | Source: Ambulatory Visit | Attending: Family Medicine | Admitting: Family Medicine

## 2012-11-23 ENCOUNTER — Telehealth: Payer: Self-pay

## 2012-11-23 DIAGNOSIS — Z780 Asymptomatic menopausal state: Secondary | ICD-10-CM

## 2012-11-23 DIAGNOSIS — Z1231 Encounter for screening mammogram for malignant neoplasm of breast: Secondary | ICD-10-CM

## 2012-11-23 NOTE — Telephone Encounter (Signed)
Patient called requesting hydrocodone refill to [facility].  Per pharmacy patient still has a refill.  Patient informed of this and will call [facility].

## 2012-12-01 DIAGNOSIS — M8580 Other specified disorders of bone density and structure, unspecified site: Secondary | ICD-10-CM | POA: Insufficient documentation

## 2012-12-01 DIAGNOSIS — M810 Age-related osteoporosis without current pathological fracture: Secondary | ICD-10-CM | POA: Insufficient documentation

## 2012-12-02 ENCOUNTER — Encounter: Payer: Self-pay | Admitting: Physical Medicine & Rehabilitation

## 2012-12-02 ENCOUNTER — Encounter: Payer: Medicaid Other | Attending: Physical Medicine and Rehabilitation | Admitting: Physical Medicine & Rehabilitation

## 2012-12-02 VITALS — BP 145/78 | HR 111 | Resp 15 | Ht 61.5 in | Wt 143.2 lb

## 2012-12-02 DIAGNOSIS — M67911 Unspecified disorder of synovium and tendon, right shoulder: Secondary | ICD-10-CM

## 2012-12-02 DIAGNOSIS — IMO0001 Reserved for inherently not codable concepts without codable children: Secondary | ICD-10-CM | POA: Insufficient documentation

## 2012-12-02 DIAGNOSIS — M948X9 Other specified disorders of cartilage, unspecified sites: Secondary | ICD-10-CM

## 2012-12-02 DIAGNOSIS — M8580 Other specified disorders of bone density and structure, unspecified site: Secondary | ICD-10-CM

## 2012-12-02 DIAGNOSIS — F32A Depression, unspecified: Secondary | ICD-10-CM

## 2012-12-02 DIAGNOSIS — M542 Cervicalgia: Secondary | ICD-10-CM | POA: Insufficient documentation

## 2012-12-02 DIAGNOSIS — M67919 Unspecified disorder of synovium and tendon, unspecified shoulder: Secondary | ICD-10-CM | POA: Insufficient documentation

## 2012-12-02 DIAGNOSIS — F329 Major depressive disorder, single episode, unspecified: Secondary | ICD-10-CM

## 2012-12-02 DIAGNOSIS — G8929 Other chronic pain: Secondary | ICD-10-CM | POA: Insufficient documentation

## 2012-12-02 DIAGNOSIS — M719 Bursopathy, unspecified: Secondary | ICD-10-CM | POA: Insufficient documentation

## 2012-12-02 DIAGNOSIS — M545 Low back pain, unspecified: Secondary | ICD-10-CM | POA: Insufficient documentation

## 2012-12-02 MED ORDER — HYDROCODONE-ACETAMINOPHEN 5-325 MG PO TABS: 1.0000 | ORAL_TABLET | Freq: Four times a day (QID) | ORAL | Status: DC | PRN

## 2012-12-02 NOTE — Progress Notes (Signed)
Subjective:    Patient ID: Robin RUACHO, female    DOB: 04/02/1956, 57 y.o.   MRN: [id number]  HPI  Robin Morgan is back regarding her FMS. Her biggest complaint is that she has had increasing back pain which is usually below her right scapula and sometimes around the lateral border. She finds that the pain bothers her if she is sitting or standing. Lying supine seems to help it more than anything. Ice also helps.   She was placed on insulin for her diabetes control, but her sugars have still been elevated.  She had bone denisity tests done last week which showed that her bone density was low but not osteoporotic.   Pain Inventory Average Pain 8 Pain Right Now 9 My pain is constant, sharp, burning and aching   In the last 24 hours, has pain interfered with the following? General activity 8 Relation with others 8 Enjoyment of life 8 What TIME of day is your pain at its worst? day,night time. Sleep (in general) Fair  Pain is worse with: bending and sitting Pain improves with: n/a Relief from Meds: n/a  Mobility ability to climb steps?  yes do you drive?  yes  Function disabled: date disabled n/a  Neuro/Psych bladder control problems bowel control problems numbness tingling spasms  Prior Studies Any changes since last visit?  no  Physicians involved in your care Any changes since last visit?  no   Family History  Problem Relation Age of Onset  . Alzheimer's disease Mother   . Diabetes Father    History   Social History  . Marital Status: Married    Spouse Name: [name]    Number of Children: [name]  . Years of Education: [name]   Social History Main Topics  . Smoking status: Never Smoker   . Smokeless tobacco: None  . Alcohol Use: No  . Drug Use: No  . Sexually Active: None   Other Topics Concern  . None   Social History Narrative  . None   Past Surgical History  Procedure Laterality Date  . Abdominal hysterectomy    . Spine surgery    .  Cholecystectomy    . Appendectomy    . Tonsillectomy    . Breast surgery      rt breast cyst  . Rotator cuff repair      right   Past Medical History  Diagnosis Date  . Myalgia   . Calcific tendonitis   . Cervical spondylosis without myelopathy   . Depression   . Pain in joint, upper arm   . Diabetes mellitus   . Hypertension   . Hiatal hernia    BP 145/78  Pulse 111  Resp 15  Ht 5' 1.5" (1.562 m)  Wt 143 lb 3.2 oz (64.955 kg)  BMI 26.62 kg/m2  SpO2 95%      Review of Systems  Gastrointestinal: Positive for abdominal pain and diarrhea.  Neurological: Positive for numbness.       Tingling, spasms   All other systems reviewed and are negative.       Objective:   Physical Exam  Constitutional: She is oriented to person, place, and time. She appears well-developed and well-nourished. She has gained a little weight. HENT:  Head: Normocephalic and atraumatic.  Eyes: Conjunctivae and EOM are normal. Pupils are equal, round, and reactive to light.  Neck: Normal range of motion.  Cardiovascular: Normal rate, regular rhythm and normal heart sounds.  Pulmonary/Chest: Breath sounds normal. No  respiratory distress. She has no wheezes.  Abdominal: Soft. Bowel sounds are normal. She exhibits no distension.  Musculoskeletal:  Lumbar/thoracic back: She exhibits decreased range of motion and tenderness. She has a taut band of muscle which is tender over the T5- T8 levels on the right. Her ribs are generally tender to touch over the right more than left. Head forward posture.  Arms: Generalized tenderness arms and legs.  Neurological: She is alert and oriented to person, place, and time. She has normal strength and normal reflexes. No sensory deficit.  Psychiatric: She has a normal mood and affect. Her behavior is normal. Judgment and thought content normal.    Assessment & Plan:   ASSESSMENT:  1. Fibromyalgia  2. Rotator cuff syndrome on the right  3. History of repair was  with chronic pain.  4. Cervicalgia.  5. Depression- which has improved  7. Thoracic pain/ Myofascial most likely   8. Decreased bone density  PLAN:  1. After informed consent and preparation of the skin with betadine and isopropyl alcohol, I injected the right thoracic paraspinals with three total TPI's consisting each of 2cc 1% lidocaine. The patient tolerated well, and no complications were encountered. Afterward the area was cleaned and dressed. Post- injection instructions were provided.  2. Hydrocodone 5/325 one-half to one p.o. B.i.d. She can take up to 2000mg  tylenol per day.  3. Discussed pilates principles and posture.   4. Reviewed her bone density testing. Recommended calcium with vitamin d and follow up with Dr. Mardelle Matte. She may need a vitamin d level checked.   5. Follow up with me in about 2 months. 30 minutes of face to face patient care time were spent during this visit. All questions were encouraged and answered.

## 2012-12-02 NOTE — Patient Instructions (Signed)
CHECK WITH DR ANDY REGARDING VITAMIN D LEVEL AND FOLLOW UP OF YOUR BONE DENSITY TESTING (YOUR ARE IN THE LOW DENSITY RANGE----NOT OSTEOPOROSIS)  DHEA----25MG  DAILY FOR MUSCLE PAIN AND ENERGY

## 2012-12-13 ENCOUNTER — Encounter: Payer: Self-pay | Admitting: Physical Medicine & Rehabilitation

## 2012-12-13 ENCOUNTER — Telehealth: Payer: Self-pay | Admitting: Physical Medicine & Rehabilitation

## 2012-12-13 ENCOUNTER — Encounter: Payer: Medicaid Other | Attending: Physical Medicine and Rehabilitation | Admitting: Physical Medicine & Rehabilitation

## 2012-12-13 ENCOUNTER — Ambulatory Visit
Admission: RE | Admit: 2012-12-13 | Discharge: 2012-12-13 | Disposition: A | Discharge status: Home / Self Care | Payer: Medicaid Other | Source: Ambulatory Visit | Attending: Physical Medicine & Rehabilitation | Admitting: Physical Medicine & Rehabilitation

## 2012-12-13 VITALS — BP 129/68 | HR 108 | Resp 14 | Ht 61.0 in | Wt 140.0 lb

## 2012-12-13 DIAGNOSIS — M67919 Unspecified disorder of synovium and tendon, unspecified shoulder: Secondary | ICD-10-CM

## 2012-12-13 DIAGNOSIS — M719 Bursopathy, unspecified: Secondary | ICD-10-CM

## 2012-12-13 DIAGNOSIS — IMO0001 Reserved for inherently not codable concepts without codable children: Secondary | ICD-10-CM | POA: Insufficient documentation

## 2012-12-13 DIAGNOSIS — M67911 Unspecified disorder of synovium and tendon, right shoulder: Secondary | ICD-10-CM

## 2012-12-13 NOTE — Patient Instructions (Signed)
Increase your aerobic exercise. Work on posture and stretching!!!

## 2012-12-13 NOTE — Telephone Encounter (Signed)
Patient seen in office today and discussed

## 2012-12-13 NOTE — Progress Notes (Signed)
Subjective:    Patient ID: SHAKEMA SURITA, female    DOB: [birth year], 57 y.o.   MRN: [id number]  HPI  Mrs. Earlene Plater is back early today with an increase in her left low back pain. She noticed it come on Saturday and it progressed during the day. When she woke up on Sunday it was worse. She feels "lumps" in her left low back. She has tried ice, flexeril, narcotics, rest. It bothers her with standing, sitting, or lying on that side. Nothing seems to be relieving the pain.   She does recall moving some clothes in trash bags which she had to drag across the floor.   Pain Inventory Average Pain 8 Pain Right Now 9 My pain is sharp and stabbing  In the last 24 hours, has pain interfered with the following? General activity 10 Relation with others 8 Enjoyment of life 9 What TIME of day is your pain at its worst? all the time Sleep (in general) Poor  Pain is worse with: walking and bending Pain improves with: medication Relief from Meds: 6  Mobility ability to climb steps?  yes do you drive?  yes  Function disabled: date disabled .  Neuro/Psych bladder control problems spasms  Prior Studies Any changes since last visit?  no  Physicians involved in your care Any changes since last visit?  no   Family History  Problem Relation Age of Onset  . Alzheimer's disease Mother   . Diabetes Father    History   Social History  . Marital Status: Married    Spouse Name: [name]    Number of Children: [name]  . Years of Education: [name]   Social History Main Topics  . Smoking status: Never Smoker   . Smokeless tobacco: None  . Alcohol Use: No  . Drug Use: No  . Sexually Active: None   Other Topics Concern  . None   Social History Narrative  . None   Past Surgical History  Procedure Laterality Date  . Abdominal hysterectomy    . Spine surgery    . Cholecystectomy    . Appendectomy    . Tonsillectomy    . Breast surgery      rt breast cyst  . Rotator cuff repair      right    Past Medical History  Diagnosis Date  . Myalgia   . Calcific tendonitis   . Cervical spondylosis without myelopathy   . Depression   . Pain in joint, upper arm   . Diabetes mellitus   . Hypertension   . Hiatal hernia    BP 129/68  Pulse 108  Resp 14  Ht 5\' 1"  (1.549 m)  Wt 140 lb (63.504 kg)  BMI 26.47 kg/m2  SpO2 98%     Review of Systems  Genitourinary: Positive for difficulty urinating.  Musculoskeletal: Positive for back pain.  All other systems reviewed and are negative.       Objective:   Physical Exam Constitutional: She is oriented to person, place, and time. She appears well-developed and well-nourished. She has gained a little weight.  HENT:  Head: Normocephalic and atraumatic.  Eyes: Conjunctivae and EOM are normal. Pupils are equal, round, and reactive to light.  Neck: Normal range of motion.  Cardiovascular: Normal rate, regular rhythm and normal heart sounds.  Pulmonary/Chest: Breath sounds normal. No respiratory distress. She has no wheezes.  Abdominal: Soft. Bowel sounds are normal. She exhibits no distension.  Musculoskeletal:  Lumbar/thoracic back: left lumbar paraspinals  are tender to touch with spasms, small lipoma also noted.  Pain more severe with extension and left facet maneuvers although flexion also increased pain.  Arms: Generalized tenderness arms and legs.  Neurological: She is alert and oriented to person, place, and time. She has normal strength and normal reflexes. No sensory deficit.  Psychiatric: She has a normal mood and affect. Her behavior is normal. Judgment and thought content normal.  Assessment & Plan:   ASSESSMENT:  1. Fibromyalgia  2. Rotator cuff syndrome on the right  3. History of repair was with chronic pain.  4. Cervicalgia.  5. Depression- which has improved  7. Thoracic pain/ Myofascial most likely  8. Decreased bone density  PLAN:  1. After informed consent and preparation of the skin with betadine and  isopropyl alcohol, I injected the left L4-5 paraspinals with one TPI consisting each of 2cc 1% lidocaine. The patient tolerated well, and no complications were encountered. Afterward the area was cleaned and dressed. Post- injection instructions were provided.  2. Hydrocodone 5/325 one-half to one p.o. B.i.d. She can take up to 2000mg  tylenol per day.  3. Discussed pilates principles and posture, aggressive ROM, aerobic exercise 4. Will order lumbar xrays as well as we've never done these to rule out DDD, DJD 5. Follow up with me in about 2 months. 30 minutes of face to face patient care time were spent during this visit. All questions were encouraged and answered.

## 2012-12-13 NOTE — Telephone Encounter (Signed)
Mild to moderate arthritis and disc disease on xray. We will stay conservative for now. Work on the things we discussed today and observe

## 2013-01-12 ENCOUNTER — Telehealth: Payer: Self-pay

## 2013-01-12 NOTE — Telephone Encounter (Signed)
Can be refilled, when she is due

## 2013-01-12 NOTE — Telephone Encounter (Signed)
Hydrocodone refill request.  Please advise.

## 2013-01-13 MED ORDER — HYDROCODONE-ACETAMINOPHEN 5-325 MG PO TABS: 1.0000 | ORAL_TABLET | Freq: Four times a day (QID) | ORAL | Status: DC | PRN

## 2013-01-13 NOTE — Telephone Encounter (Signed)
30 days supply Hydrocodone called into [facility].

## 2013-02-01 ENCOUNTER — Encounter: Payer: Medicaid Other | Attending: Physical Medicine and Rehabilitation | Admitting: Physical Medicine & Rehabilitation

## 2013-02-01 ENCOUNTER — Encounter: Payer: Self-pay | Admitting: Physical Medicine & Rehabilitation

## 2013-02-01 VITALS — BP 142/64 | HR 96 | Resp 16 | Ht 61.0 in | Wt 139.0 lb

## 2013-02-01 DIAGNOSIS — M8580 Other specified disorders of bone density and structure, unspecified site: Secondary | ICD-10-CM

## 2013-02-01 DIAGNOSIS — Z5181 Encounter for therapeutic drug level monitoring: Secondary | ICD-10-CM

## 2013-02-01 DIAGNOSIS — M67911 Unspecified disorder of synovium and tendon, right shoulder: Secondary | ICD-10-CM

## 2013-02-01 DIAGNOSIS — M719 Bursopathy, unspecified: Secondary | ICD-10-CM | POA: Insufficient documentation

## 2013-02-01 DIAGNOSIS — F32A Depression, unspecified: Secondary | ICD-10-CM

## 2013-02-01 DIAGNOSIS — F329 Major depressive disorder, single episode, unspecified: Secondary | ICD-10-CM

## 2013-02-01 DIAGNOSIS — M67919 Unspecified disorder of synovium and tendon, unspecified shoulder: Secondary | ICD-10-CM | POA: Insufficient documentation

## 2013-02-01 DIAGNOSIS — IMO0001 Reserved for inherently not codable concepts without codable children: Secondary | ICD-10-CM

## 2013-02-01 DIAGNOSIS — Z79899 Other long term (current) drug therapy: Secondary | ICD-10-CM | POA: Insufficient documentation

## 2013-02-01 DIAGNOSIS — F3289 Other specified depressive episodes: Secondary | ICD-10-CM | POA: Insufficient documentation

## 2013-02-01 DIAGNOSIS — M948X9 Other specified disorders of cartilage, unspecified sites: Secondary | ICD-10-CM | POA: Insufficient documentation

## 2013-02-01 NOTE — Addendum Note (Signed)
Addended by: Judd Gaudier on: 02/01/2013 10:16 AM   Modules accepted: Orders

## 2013-02-01 NOTE — Patient Instructions (Signed)
CONTINUE TO PRACTICE GOOD POSTURE AND STRETCHING!!!

## 2013-02-01 NOTE — Progress Notes (Signed)
Subjective:    Patient ID: Robin Morgan, female    DOB: Dec 30, 1955, 57 y.o.   MRN: [id number]  HPI  Robin Morgan is back regarding her FMS. She had good results with the injection but several days later developed some higher rib/back pain.  Her xrays of the low back revealed L1-2, L2-3, DDD with anterior sclerosis/end plate.  Overall she's been feeling well otherwise. She is trying to stay active with regular exercise and stretching.   She is typically using 1/2 to 1 1/2 hydrocodone per day.     Pain Inventory Average Pain 9 Pain Right Now 7 My pain is constant, stabbing and aching  In the last 24 hours, has pain interfered with the following? General activity 8 Relation with others 7 Enjoyment of life 8 What TIME of day is your pain at its worst? evening, night Sleep (in general) Fair  Pain is worse with: sitting and standing Pain improves with: heat/ice and medication Relief from Meds: 8  Mobility ability to climb steps?  yes do you drive?  yes  Function disabled: date disabled na  Neuro/Psych bladder control problems bowel control problems numbness spasms  Prior Studies Any changes since last visit?  no  Physicians involved in your care Any changes since last visit?  no   Family History  Problem Relation Age of Onset  . Alzheimer's disease Mother   . Diabetes Father    History   Social History  . Marital Status: Married    Spouse Name: [name]    Number of Children: [name]  . Years of Education: [name]   Social History Main Topics  . Smoking status: Never Smoker   . Smokeless tobacco: None  . Alcohol Use: No  . Drug Use: No  . Sexually Active: None   Other Topics Concern  . None   Social History Narrative  . None   Past Surgical History  Procedure Laterality Date  . Abdominal hysterectomy    . Spine surgery    . Cholecystectomy    . Appendectomy    . Tonsillectomy    . Breast surgery      rt breast cyst  . Rotator cuff repair      right    Past Medical History  Diagnosis Date  . Myalgia   . Calcific tendonitis   . Cervical spondylosis without myelopathy   . Depression   . Pain in joint, upper arm   . Diabetes mellitus   . Hypertension   . Hiatal hernia    BP 142/64  Pulse 96  Resp 16  Ht 5\' 1"  (1.549 m)  Wt 139 lb (63.05 kg)  BMI 26.28 kg/m2  SpO2 96%     Review of Systems  Gastrointestinal: Positive for diarrhea.  Neurological: Positive for numbness.       Spasms  All other systems reviewed and are negative.       Objective:   Physical Exam Constitutional: She is oriented to person, place, and time. She appears well-developed and well-nourished. She has gained a little weight.  HENT:  Head: Normocephalic and atraumatic.  Eyes: Conjunctivae and EOM are normal. Pupils are equal, round, and reactive to light.  Neck: Normal range of motion.  Cardiovascular: Normal rate, regular rhythm and normal heart sounds.  Pulmonary/Chest: Breath sounds normal. No respiratory distress. She has no wheezes.  Abdominal: Soft. Bowel sounds are normal. She exhibits no distension.  Musculoskeletal:  Lumbar/thoracic back: left lumbar paraspinals are tender to touch with spasms,  small lipoma also noted. Low back better. Has TPI in right flank worse with rotation and lateral. It seems to be in the obliques. Arms: Generalized tenderness arms and legs.  Neurological: She is alert and oriented to person, place, and time. She has normal strength and normal reflexes. No sensory deficit.  Psychiatric: She has a normal mood and affect. Her behavior is normal. Judgment and thought content normal. Mood is quite up beat today.   Assessment & Plan:   ASSESSMENT:  1. Fibromyalgia with myofascial pain 2. Rotator cuff syndrome on the right  3. History of repair was with chronic pain.  4. Cervicalgia.  5. Depression- which has improved  7. Mid/low back pain---documented DDD on xray last month 8. Decreased bone density   PLAN:   1. After informed consent and preparation of the skin with isopropyl alcohol, I injected the right internal oblique with 2cc of 1% lidocaine. The patient tolerated well, and no complications were experienced. Post-injection instructions were provided..  2. Hydrocodone 5/325 one-half to one p.o. B.i.d prn. Tylenol prn also---no refill needed today. 3. Discussed pilates principles and posture, aggressive ROM, aerobic exercise. These are all the more important considering her xrays with DDD. 4. No specific action warranted regarding xrays at the moment but consider follow up imaging if symptoms worsen.  5. Follow up with me in about 2 months. 30 minutes of face to face patient care time were spent during this visit. All questions were encouraged and answered.

## 2013-03-02 ENCOUNTER — Other Ambulatory Visit: Payer: Self-pay

## 2013-03-02 MED ORDER — HYDROCODONE-ACETAMINOPHEN 5-325 MG PO TABS
1.0000 | ORAL_TABLET | Freq: Four times a day (QID) | ORAL | Status: DC | PRN
Start: 2013-03-02 — End: 2013-04-12

## 2013-04-12 ENCOUNTER — Encounter: Payer: Self-pay | Admitting: Physical Medicine & Rehabilitation

## 2013-04-12 ENCOUNTER — Encounter: Payer: Medicaid Other | Attending: Physical Medicine and Rehabilitation | Admitting: Physical Medicine & Rehabilitation

## 2013-04-12 VITALS — BP 138/72 | HR 109 | Resp 14 | Ht 61.0 in | Wt 139.0 lb

## 2013-04-12 DIAGNOSIS — M948X9 Other specified disorders of cartilage, unspecified sites: Secondary | ICD-10-CM | POA: Insufficient documentation

## 2013-04-12 DIAGNOSIS — IMO0001 Reserved for inherently not codable concepts without codable children: Secondary | ICD-10-CM

## 2013-04-12 DIAGNOSIS — F329 Major depressive disorder, single episode, unspecified: Secondary | ICD-10-CM

## 2013-04-12 DIAGNOSIS — Z79899 Other long term (current) drug therapy: Secondary | ICD-10-CM | POA: Insufficient documentation

## 2013-04-12 DIAGNOSIS — M67919 Unspecified disorder of synovium and tendon, unspecified shoulder: Secondary | ICD-10-CM

## 2013-04-12 DIAGNOSIS — F3289 Other specified depressive episodes: Secondary | ICD-10-CM | POA: Insufficient documentation

## 2013-04-12 DIAGNOSIS — M719 Bursopathy, unspecified: Secondary | ICD-10-CM | POA: Insufficient documentation

## 2013-04-12 DIAGNOSIS — F32A Depression, unspecified: Secondary | ICD-10-CM

## 2013-04-12 DIAGNOSIS — M8580 Other specified disorders of bone density and structure, unspecified site: Secondary | ICD-10-CM

## 2013-04-12 DIAGNOSIS — H547 Unspecified visual loss: Secondary | ICD-10-CM | POA: Insufficient documentation

## 2013-04-12 DIAGNOSIS — Z5181 Encounter for therapeutic drug level monitoring: Secondary | ICD-10-CM | POA: Insufficient documentation

## 2013-04-12 MED ORDER — HYDROCODONE-ACETAMINOPHEN 5-325 MG PO TABS: 1.0000 | ORAL_TABLET | Freq: Four times a day (QID) | ORAL | Status: DC | PRN

## 2013-04-12 MED ORDER — HYDROCODONE-ACETAMINOPHEN 5-325 MG PO TABS
1.0000 | ORAL_TABLET | Freq: Four times a day (QID) | ORAL | Status: DC | PRN
Start: 2013-04-12 — End: 2013-04-12

## 2013-04-12 NOTE — Patient Instructions (Signed)
CALL ME WITH ANY PROBLEMS OR QUESTIONS (#[phone]).  HAVE A GOOD DAY   WORK ON BETTER POSTURE AND REGULAR EXERCISE

## 2013-04-12 NOTE — Progress Notes (Signed)
Subjective:    Patient ID: Robin Morgan, female    DOB: 12/11/1955, 57 y.o.   MRN: [id number]  HPI  Robin Morgan is back regarding her chronic pain. Over the last month she's had increased "pressure" over the top of her head spreading down to the left eye. She denies frank pain, but states the pressure is very distracting and can be uncomfortable. She feels that her vision has gotten worse as well. She denies sinus congestion or URI. The last time she had her eyes checked was in the winter or early spring when she received new lenses.   Her TPI was helpful last month. She has had worsening pain in the middle of her back however, which has affected her sleep.      Pain Inventory Average Pain 9 Pain Right Now 8 My pain is constant and aching  In the last 24 hours, has pain interfered with the following? General activity 6 Relation with others 4 Enjoyment of life 6 What TIME of day is your pain at its worst? evening and night Sleep (in general) Poor  Pain is worse with: some activites Pain improves with: heat/ice, medication and injections Relief from Meds: 8  Mobility ability to climb steps?  yes do you drive?  yes  Function disabled: date disabled .  Neuro/Psych bladder control problems bowel control problems spasms confusion  Prior Studies Any changes since last visit?  no  Physicians involved in your care Any changes since last visit?  no   Family History  Problem Relation Age of Onset  . Alzheimer's disease Mother   . Diabetes Father    History   Social History  . Marital Status: Married    Spouse Name: [name]    Number of Children: [name]  . Years of Education: [name]   Social History Main Topics  . Smoking status: Never Smoker   . Smokeless tobacco: None  . Alcohol Use: No  . Drug Use: No  . Sexual Activity: None   Other Topics Concern  . None   Social History Narrative  . None   Past Surgical History  Procedure Laterality Date  . Abdominal  hysterectomy    . Spine surgery    . Cholecystectomy    . Appendectomy    . Tonsillectomy    . Breast surgery      rt breast cyst  . Rotator cuff repair      right   Past Medical History  Diagnosis Date  . Myalgia   . Calcific tendonitis   . Cervical spondylosis without myelopathy   . Depression   . Pain in joint, upper arm   . Diabetes mellitus   . Hypertension   . Hiatal hernia    BP 138/72  Pulse 109  Resp 14  Ht 5\' 1"  (1.549 m)  Wt 139 lb (63.05 kg)  BMI 26.28 kg/m2  SpO2 95%     Review of Systems  Respiratory: Positive for shortness of breath.   Genitourinary: Positive for difficulty urinating.  Psychiatric/Behavioral: Positive for confusion.  All other systems reviewed and are negative.       Objective:   Physical Exam Constitutional: She is oriented to person, place, and time. She appears well-developed and well-nourished. She has gained a little weight.  HENT:  Head: Normocephalic and atraumatic.  Eyes: Conjunctivae and EOM are normal. Pupils are equal, round, and reactive to light. Perhaps a little exopthalmos Neck: Normal range of motion.  Cardiovascular: Normal rate, regular rhythm  and normal heart sounds.  Pulmonary/Chest: Breath sounds normal. No respiratory distress. She has no wheezes.  Abdominal: Soft. Bowel sounds are normal. She exhibits no distension.  Musculoskeletal: has a significant head forward posture. Lumbar/thoracic back: mid thoracic paraspinals are tender to touch with spasms,   Low back better. H  Arms: Generalized tenderness arms and legs.  Neurological: She is alert and oriented to person, place, and time. She has normal strength and normal reflexes. No sensory deficit. Visual acuity slightly diminished, but could read a sign across the room with her glasses. Psychiatric: She has a normal mood and affect. Her behavior is normal. Judgment and thought content normal. Mood is quite up beat today.  Assessment & Plan:   ASSESSMENT:   1. Fibromyalgia with myofascial pain  2. Rotator cuff syndrome on the right  3. History of repair was with chronic pain.  4. Cervicalgia.  5. Depression- which has improved  7. Mid/low back pain---documented DDD on xray last month  8. Decreased bone density  9. Head and ocular pressure, decreased visual acuity .   PLAN:  1. After informed consent and preparation of the skin with isopropyl alcohol, I injected the right intrascapular paraspinal muscles x2 with 2cc of 1% lidocaine. The patient tolerated well, and no complications were experienced. Post-injection instructions were provided..  2. Hydrocodone 5/325 one-half to one p.o. B.i.d prn. Tylenol prn also---no refill needed today.  3.Needs to work on posture!!!!  4. Asked her to see her optometrist, given her hx of DM,  so that he/she may examine her eyes. If they look ok, then we can pursue an MRI or CT of her brain if symptoms persists or worsen. At this point i see no central signs of pathology.  5. Follow up with me in about 2 months. 30 minutes of face to face patient care time were spent during this visit. All questions were encouraged and answered.           Assessment & Plan:

## 2013-04-26 ENCOUNTER — Ambulatory Visit: Payer: Medicaid Other | Admitting: Physical Medicine & Rehabilitation

## 2013-04-26 ENCOUNTER — Other Ambulatory Visit: Payer: Self-pay | Admitting: Physical Medicine and Rehabilitation

## 2013-05-10 ENCOUNTER — Encounter: Payer: Self-pay | Admitting: Physical Medicine & Rehabilitation

## 2013-05-10 ENCOUNTER — Encounter: Payer: Medicaid Other | Attending: Physical Medicine and Rehabilitation | Admitting: Physical Medicine & Rehabilitation

## 2013-05-10 VITALS — BP 143/72 | HR 96 | Resp 14 | Ht 62.0 in | Wt 140.0 lb

## 2013-05-10 DIAGNOSIS — M719 Bursopathy, unspecified: Secondary | ICD-10-CM | POA: Insufficient documentation

## 2013-05-10 DIAGNOSIS — M67919 Unspecified disorder of synovium and tendon, unspecified shoulder: Secondary | ICD-10-CM

## 2013-05-10 DIAGNOSIS — F32A Depression, unspecified: Secondary | ICD-10-CM

## 2013-05-10 DIAGNOSIS — IMO0001 Reserved for inherently not codable concepts without codable children: Secondary | ICD-10-CM | POA: Insufficient documentation

## 2013-05-10 DIAGNOSIS — F329 Major depressive disorder, single episode, unspecified: Secondary | ICD-10-CM

## 2013-05-10 DIAGNOSIS — F3289 Other specified depressive episodes: Secondary | ICD-10-CM | POA: Insufficient documentation

## 2013-05-10 DIAGNOSIS — M961 Postlaminectomy syndrome, not elsewhere classified: Secondary | ICD-10-CM | POA: Insufficient documentation

## 2013-05-10 MED ORDER — DICLOFENAC SODIUM 1 % TD GEL: 1.0000 "application " | Freq: Three times a day (TID) | TRANSDERMAL | Status: DC

## 2013-05-10 MED ORDER — HYDROCODONE-ACETAMINOPHEN 5-325 MG PO TABS: 1.0000 | ORAL_TABLET | Freq: Four times a day (QID) | ORAL | Status: DC | PRN

## 2013-05-10 MED ORDER — TIZANIDINE HCL 4 MG PO TABS: 4.0000 mg | ORAL_TABLET | Freq: Four times a day (QID) | ORAL | Status: DC | PRN

## 2013-05-10 NOTE — Progress Notes (Signed)
Subjective:    Patient ID: Robin Morgan, female    DOB: [birth year], 57 y.o.   MRN: [id number]  HPI  Saydie is back regarding her chronic pain. She had relief with the TPI's but she is still having headaches which radiate from her occipital area down the back.  Her pain before the headaches usuall is in her right trap with radiation into the neck. The sub-occipital area is also often tender.   She is using one to two hydrocodone each day.   Pain Inventory Average Pain 8 Pain Right Now 9 My pain is constant and aching  In the last 24 hours, has pain interfered with the following? General activity 7 Relation with others 7 Enjoyment of life 7 What TIME of day is your pain at its worst? morning day and night Sleep (in general) Poor  Pain is worse with: sitting and standing Pain improves with: heat/ice and medication Relief from Meds: 6  Mobility ability to climb steps?  yes do you drive?  yes  Function disabled: date disabled .  Neuro/Psych bladder control problems bowel control problems tingling spasms  Prior Studies Any changes since last visit?  no  Physicians involved in your care Any changes since last visit?  no   Family History  Problem Relation Age of Onset  . Alzheimer's disease Mother   . Diabetes Father    History   Social History  . Marital Status: Married    Spouse Name: [name]    Number of Children: [name]  . Years of Education: [name]   Social History Main Topics  . Smoking status: Never Smoker   . Smokeless tobacco: None  . Alcohol Use: No  . Drug Use: No  . Sexual Activity: None   Other Topics Concern  . None   Social History Narrative  . None   Past Surgical History  Procedure Laterality Date  . Abdominal hysterectomy    . Spine surgery    . Cholecystectomy    . Appendectomy    . Tonsillectomy    . Breast surgery      rt breast cyst  . Rotator cuff repair      right   Past Medical History  Diagnosis Date  . Myalgia   .  Calcific tendonitis   . Cervical spondylosis without myelopathy   . Depression   . Pain in joint, upper arm   . Diabetes mellitus   . Hypertension   . Hiatal hernia    BP 143/72  Pulse 96  Resp 14  Ht 5\' 2"  (1.575 m)  Wt 140 lb (63.504 kg)  BMI 25.6 kg/m2  SpO2 95%     Review of Systems  HENT: Positive for neck pain.   Respiratory: Positive for shortness of breath.   Gastrointestinal: Positive for diarrhea.  Musculoskeletal: Positive for back pain.  Neurological:       Tingling, spasm  All other systems reviewed and are negative.       Objective:   Physical Exam Constitutional: She is oriented to person, place, and time. She appears well-developed and well-nourished. She has gained a little weight.  HENT:  Head: Normocephalic and atraumatic.  Eyes: Conjunctivae and EOM are normal. Pupils are equal, round, and reactive to light. Perhaps a little exopthalmos  Neck: Normal range of motion.  Cardiovascular: Normal rate, regular rhythm and normal heart sounds.  Pulmonary/Chest: Breath sounds normal. No respiratory distress. She has no wheezes.  Abdominal: Soft. Bowel sounds are normal. She exhibits  no distension.  Musculoskeletal: has a significant head forward posture. TP's in the right mid trap and splenius capitis on the right.  Lumbar/thoracic back: mid thoracic paraspinals are tender to touch with spasms, Low back better. H  Arms: Generalized tenderness arms and legs.  Neurological: She is alert and oriented to person, place, and time. She has normal strength and normal reflexes. No sensory deficit. Visual acuity slightly diminished, but could read a sign across the room with her glasses. Psychiatric: She has a normal mood and affect. Her behavior is normal. Judgment and thought content normal. Mood is quite up beat today.    Assessment & Plan:   ASSESSMENT:  1. Fibromyalgia with myofascial pain.   2. Rotator cuff syndrome on the right.   3. History of repair was  with chronic pain.  4. Cervicalgia. Post-laminectomy syndrome.  5. Depression- which has improved  7. Mid/low back pain---documented DDD on xray last month  8. Decreased bone density  9. Head and ocular pressure, decreased visual acuity.  PLAN:  1. After informed consent and preparation of the skin with isopropyl alcohol, I injected the right splenius capitis, right mid trap each with 2cc of 1% lidocaine. The patient tolerated well, and no complications were experienced. Post-injection instructions were provided..  2. Hydrocodone 5/325 one-half to one p.o. B.i.d prn.   3. Trial of zanaflex for breakthrough spasms and myofascial pain.  4. Again asked her to see her optometrist, given her hx of DM, so that he/she may examine her eyes. If they look ok, then we could pursue an MRI or CT of her brain if symptoms persists or worsen. At this point i see no central signs of pathology.  5. Follow up with me in about 1 months. 30 minutes of face to face patient care time were spent during this visit. All questions were encouraged and answered.           Assessment & Plan:  Patient aware of hydrocodone office policy changed.  Letter given.

## 2013-05-10 NOTE — Patient Instructions (Signed)
TRY TAKING THE ZANAFLEX WHEN YOUR HEADACHES ARE COMING ON. IT WILL HELP WITH MUSCLE SPASMS

## 2013-06-06 ENCOUNTER — Telehealth: Payer: Self-pay

## 2013-06-06 NOTE — Telephone Encounter (Signed)
Tried to contact patient regarding notes received from [facility].  Dr Riley Kill wanted to be sure patient had followed up on exam and possible MRI.  Record scanned.

## 2013-06-12 DIAGNOSIS — E039 Hypothyroidism, unspecified: Secondary | ICD-10-CM | POA: Insufficient documentation

## 2013-06-14 ENCOUNTER — Encounter (INDEPENDENT_AMBULATORY_CARE_PROVIDER_SITE_OTHER): Payer: Self-pay

## 2013-06-14 ENCOUNTER — Encounter: Payer: Self-pay | Admitting: Physical Medicine & Rehabilitation

## 2013-06-14 ENCOUNTER — Encounter: Payer: Medicaid Other | Attending: Physical Medicine and Rehabilitation | Admitting: Physical Medicine & Rehabilitation

## 2013-06-14 VITALS — BP 135/53 | HR 99 | Resp 14 | Ht 61.0 in | Wt 138.6 lb

## 2013-06-14 DIAGNOSIS — F3289 Other specified depressive episodes: Secondary | ICD-10-CM | POA: Insufficient documentation

## 2013-06-14 DIAGNOSIS — H547 Unspecified visual loss: Secondary | ICD-10-CM | POA: Insufficient documentation

## 2013-06-14 DIAGNOSIS — M67911 Unspecified disorder of synovium and tendon, right shoulder: Secondary | ICD-10-CM

## 2013-06-14 DIAGNOSIS — Z5181 Encounter for therapeutic drug level monitoring: Secondary | ICD-10-CM | POA: Insufficient documentation

## 2013-06-14 DIAGNOSIS — M961 Postlaminectomy syndrome, not elsewhere classified: Secondary | ICD-10-CM | POA: Insufficient documentation

## 2013-06-14 DIAGNOSIS — F32A Depression, unspecified: Secondary | ICD-10-CM

## 2013-06-14 DIAGNOSIS — M719 Bursopathy, unspecified: Secondary | ICD-10-CM | POA: Insufficient documentation

## 2013-06-14 DIAGNOSIS — F329 Major depressive disorder, single episode, unspecified: Secondary | ICD-10-CM

## 2013-06-14 DIAGNOSIS — IMO0001 Reserved for inherently not codable concepts without codable children: Secondary | ICD-10-CM | POA: Insufficient documentation

## 2013-06-14 DIAGNOSIS — Z79899 Other long term (current) drug therapy: Secondary | ICD-10-CM

## 2013-06-14 DIAGNOSIS — M67919 Unspecified disorder of synovium and tendon, unspecified shoulder: Secondary | ICD-10-CM | POA: Insufficient documentation

## 2013-06-14 MED ORDER — HYDROCODONE-ACETAMINOPHEN 5-325 MG PO TABS: 1.0000 | ORAL_TABLET | Freq: Four times a day (QID) | ORAL | Status: DC | PRN

## 2013-06-14 NOTE — Addendum Note (Signed)
Addended by: Sherre Poot on: 06/14/2013 11:01 AM   Modules accepted: Orders

## 2013-06-14 NOTE — Progress Notes (Signed)
Subjective:    Patient ID: Robin Morgan, female    DOB: October 03, 1955, 57 y.o.   MRN: [id number]  HPI  Robin Morgan is back regarding her chronic pain. She has had more headaches. The TPI we did on the right [place] last month which helped for about a week.  The neck tends to flare with prolonged activity. It leads to headaches in the occipital and temporal areas. Sometimes she has frontal sx. She denies any aura with these.   She continues with hydrocodone for brekathrough pain.   She has seen optho who apparently saw a "spot" behind her right eye. She follows up again next week. Her vision is unchanged since i last saw her.    Pain Inventory Average Pain 8 Pain Right Now 9 My pain is constant  In the last 24 hours, has pain interfered with the following? General activity 7 Relation with others 8 Enjoyment of life 8 What TIME of day is your pain at its worst? morning, night Sleep (in general) Fair  Pain is worse with: sitting and standing Pain improves with: heat/ice and medication Relief from Meds: 9  Mobility walk without assistance ability to climb steps?  yes do you drive?  yes  Function disabled: date disabled na  Neuro/Psych bladder control problems numbness spasms  Prior Studies Any changes since last visit?  no  Physicians involved in your care Any changes since last visit?  no   Family History  Problem Relation Age of Onset  . Alzheimer's disease Mother   . Diabetes Father    History   Social History  . Marital Status: Married    Spouse Name: [name]    Number of Children: [name]  . Years of Education: [name]   Social History Main Topics  . Smoking status: Never Smoker   . Smokeless tobacco: None  . Alcohol Use: No  . Drug Use: No  . Sexual Activity: None   Other Topics Concern  . None   Social History Narrative  . None   Past Surgical History  Procedure Laterality Date  . Abdominal hysterectomy    . Spine surgery    . Cholecystectomy    .  Appendectomy    . Tonsillectomy    . Breast surgery      rt breast cyst  . Rotator cuff repair      right   Past Medical History  Diagnosis Date  . Myalgia   . Calcific tendonitis   . Cervical spondylosis without myelopathy   . Depression   . Pain in joint, upper arm   . Diabetes mellitus   . Hypertension   . Hiatal hernia    BP 135/53  Pulse 99  Resp 14  Ht 5\' 1"  (1.549 m)  Wt 138 lb 9.6 oz (62.869 kg)  BMI 26.20 kg/m2  SpO2 98%      Review of Systems  Gastrointestinal: Positive for abdominal pain.  Endocrine:       High blood sugar  Genitourinary:       Bladder control problems  Musculoskeletal: Positive for back pain.  Neurological: Positive for numbness.       Spasms  All other systems reviewed and are negative.       Objective:   Physical Exam Constitutional: She is oriented to person, place, and time. She appears well-developed and well-nourished. She has gained a little weight.  HENT:  Head: Normocephalic and atraumatic.  Eyes: Conjunctivae and EOM are normal. Pupils are equal, round,  and reactive to light. Perhaps a little exopthalmos  Neck: Normal range of motion.  Cardiovascular: Normal rate, regular rhythm and normal heart sounds.  Pulmonary/Chest: Breath sounds normal. No respiratory distress. She has no wheezes.  Abdominal: Soft. Bowel sounds are normal. She exhibits no distension.  Musculoskeletal: has a significant head forward posture. TP's in the right mid trap again and another area in the right mid thoracic paraspinals.  Lumbar/thoracic back: mid thoracic paraspinals are tender to touch with spasms, Low back better. H  Arms: Posture is fair to good.  Neurological: She is alert and oriented to person, place, and time. She has normal strength and normal reflexes. No sensory deficit. Visual acuity slightly diminished, but could read a sign across the room with her glasses. Psychiatric: She has a normal mood and affect. Her behavior is normal.  Judgment and thought content normal. Mood is pleasant.    Assessment & Plan:   ASSESSMENT:  1. Fibromyalgia with myofascial pain.  2. Rotator cuff syndrome on the right.  3. History of repair was with chronic pain.  4. Cervicalgia. Post-laminectomy syndrome, facet arthropathy symptoms, associated myofascial pain and headaches 5. Depression- which has improved  7. Mid/low back pain---documented DDD on xray last month  8. Decreased bone density  9. Head and ocular pressure, decreased visual acuity--stable.    PLAN:  1. After informed consent and preparation of the skin with isopropyl alcohol, I injected the right thoracic paraspinal, right mid trap each with 2cc of 1% lidocaine. The patient tolerated well, and no complications were experienced. Post-injection instructions were provided. 2. Hydrocodone 5/325 one-half to one p.o. B.i.d prn.  3. Made a referral to PT to address neck rom, lengthening, myofascial management, and HEP. Could consider facet injections if need be down the line  4. She will follow up with optho regarding eyes is scheduled  5. Follow up with me in about 2 months. 30 minutes of face to face patient care time were spent during this visit. All questions were encouraged and answered.

## 2013-06-14 NOTE — Patient Instructions (Signed)
CALL ME WITH ANY PROBLEMS OR QUESTIONS (#[phone]).  HAVE A GOOD DAY  

## 2013-07-13 ENCOUNTER — Telehealth: Payer: Self-pay | Admitting: *Deleted

## 2013-07-13 DIAGNOSIS — G8929 Other chronic pain: Secondary | ICD-10-CM

## 2013-07-13 DIAGNOSIS — Z01818 Encounter for other preprocedural examination: Secondary | ICD-10-CM

## 2013-07-13 DIAGNOSIS — H547 Unspecified visual loss: Secondary | ICD-10-CM

## 2013-07-13 NOTE — Telephone Encounter (Signed)
Attempted to reach Robin Morgan per Dr Riley Kill request after he was unable to reach her as well. No answer on home phone/no VM and mobile phone # has a "voicemail that has not been set up to take calls".  Unable to reach her to discuss if she would like to have MRI of brain/orbiter with&without contrast.

## 2013-07-13 NOTE — Telephone Encounter (Signed)
2nd attempt to reach Ms Bradner with no success.

## 2013-07-17 NOTE — Telephone Encounter (Signed)
I was able to reach Grenora and she wanted to go ahead with the MRI.  Order was placed for [facility]

## 2013-07-17 NOTE — Telephone Encounter (Signed)
Thank you :)

## 2013-07-18 NOTE — Telephone Encounter (Signed)
Please put an order in for lab work to have the MRI w/without contrast Brain, patient is having it MRI done on December 22 to arrive at 10:45

## 2013-07-18 NOTE — Telephone Encounter (Signed)
done

## 2013-07-31 ENCOUNTER — Other Ambulatory Visit: Payer: Self-pay | Admitting: Physical Medicine & Rehabilitation

## 2013-07-31 ENCOUNTER — Ambulatory Visit (HOSPITAL_COMMUNITY)
Admission: RE | Admit: 2013-07-31 | Discharge: 2013-07-31 | Disposition: A | Discharge status: Home / Self Care | Payer: Medicaid Other | Source: Ambulatory Visit | Attending: Physical Medicine & Rehabilitation | Admitting: Physical Medicine & Rehabilitation

## 2013-07-31 DIAGNOSIS — E119 Type 2 diabetes mellitus without complications: Secondary | ICD-10-CM | POA: Insufficient documentation

## 2013-07-31 DIAGNOSIS — G8929 Other chronic pain: Secondary | ICD-10-CM

## 2013-07-31 DIAGNOSIS — H547 Unspecified visual loss: Secondary | ICD-10-CM

## 2013-07-31 DIAGNOSIS — I10 Essential (primary) hypertension: Secondary | ICD-10-CM | POA: Insufficient documentation

## 2013-07-31 DIAGNOSIS — R51 Headache: Secondary | ICD-10-CM | POA: Insufficient documentation

## 2013-07-31 DIAGNOSIS — M5030 Other cervical disc degeneration, unspecified cervical region: Secondary | ICD-10-CM | POA: Insufficient documentation

## 2013-07-31 LAB — POCT I-STAT CREATININE: Creatinine, Ser: 0.8 mg/dL (ref 0.50–1.10)

## 2013-07-31 MED ORDER — GADOBENATE DIMEGLUMINE 529 MG/ML IV SOLN
12.0000 mL | Freq: Once | INTRAVENOUS | Status: AC | PRN
  Administered 2013-07-31: 12 mL via INTRAVENOUS

## 2013-07-31 NOTE — Progress Notes (Signed)
Spoke with amy at office. Pt's mri orbits was not pre-certified. Pt is electing to have mri of the brain with and without contrast today and to come back for mri of the orbits if needed. Informed amy at office on 07/30/13 @ 10:45 mri orbits will be left in ancillary orders.

## 2013-08-01 ENCOUNTER — Telehealth: Payer: Self-pay | Admitting: Physical Medicine & Rehabilitation

## 2013-08-01 NOTE — Telephone Encounter (Signed)
Please let Robin Morgan know that her MRI looks fine. (both brain and eye balls)

## 2013-08-01 NOTE — Telephone Encounter (Signed)
Attempted to contact patient via home phone and cell phone to inform her of MRI results no answer or voicemail.

## 2013-08-14 ENCOUNTER — Encounter: Payer: Medicaid Other | Attending: Physical Medicine and Rehabilitation | Admitting: Physical Medicine & Rehabilitation

## 2013-08-14 ENCOUNTER — Encounter: Payer: Self-pay | Admitting: Physical Medicine & Rehabilitation

## 2013-08-14 VITALS — BP 140/59 | HR 94 | Resp 14 | Ht 61.0 in | Wt 141.0 lb

## 2013-08-14 DIAGNOSIS — M719 Bursopathy, unspecified: Secondary | ICD-10-CM | POA: Insufficient documentation

## 2013-08-14 DIAGNOSIS — F32A Depression, unspecified: Secondary | ICD-10-CM

## 2013-08-14 DIAGNOSIS — F3289 Other specified depressive episodes: Secondary | ICD-10-CM

## 2013-08-14 DIAGNOSIS — H547 Unspecified visual loss: Secondary | ICD-10-CM

## 2013-08-14 DIAGNOSIS — IMO0001 Reserved for inherently not codable concepts without codable children: Secondary | ICD-10-CM | POA: Insufficient documentation

## 2013-08-14 DIAGNOSIS — M67911 Unspecified disorder of synovium and tendon, right shoulder: Secondary | ICD-10-CM

## 2013-08-14 DIAGNOSIS — M8580 Other specified disorders of bone density and structure, unspecified site: Secondary | ICD-10-CM

## 2013-08-14 DIAGNOSIS — M961 Postlaminectomy syndrome, not elsewhere classified: Secondary | ICD-10-CM | POA: Insufficient documentation

## 2013-08-14 DIAGNOSIS — F329 Major depressive disorder, single episode, unspecified: Secondary | ICD-10-CM

## 2013-08-14 DIAGNOSIS — M67919 Unspecified disorder of synovium and tendon, unspecified shoulder: Secondary | ICD-10-CM | POA: Insufficient documentation

## 2013-08-14 DIAGNOSIS — M948X9 Other specified disorders of cartilage, unspecified sites: Secondary | ICD-10-CM

## 2013-08-14 MED ORDER — HYDROCODONE-ACETAMINOPHEN 5-325 MG PO TABS: 1.0000 | ORAL_TABLET | Freq: Four times a day (QID) | ORAL | Status: DC | PRN

## 2013-08-14 NOTE — Patient Instructions (Addendum)
WORK ON HEAT, VOLTAREN GEL TO YOUR RIGHT HAND. DO DAILY STRETCHES AS WELL.

## 2013-08-14 NOTE — Progress Notes (Signed)
Subjective:    Patient ID: Robin Morgan, female    DOB: 11/16/1955, 58 y.o.   MRN: [id number]  HPI  Derotha is back regarding her chronic pain. We imaged her brain per recommendation of her opthomologist last month and the MRI was normal and stable from the 2009 study.   She continues to have cervical and shoulder girdle pain which is fairly conistent with prior levels. She did not go for PT as she had no visits left with [facility] and only has potentially one for this coming year. The PT location would not provide care unless she paid out of pocket.  She has complained of continued pain in her right hand along the palm at digits 3 and 4/knuckle area. It is tightest and most tender in the morning when she gets out of bed. There is no clicking or popping.    Pain Inventory Average Pain 8 Pain Right Now 8 My pain is constant and stabbing  In the last 24 hours, has pain interfered with the following? General activity 7 Relation with others 6 Enjoyment of life 6 What TIME of day is your pain at its worst? morning, evening and night Sleep (in general) Fair  Pain is worse with: standing Pain improves with: heat/ice and medication Relief from Meds: 6  Mobility walk without assistance ability to climb steps?  yes do you drive?  yes  Function disabled: date disabled .  Neuro/Psych bladder control problems weakness spasms confusion  Prior Studies Any changes since last visit?  no  Physicians involved in your care Any changes since last visit?  no   Family History  Problem Relation Age of Onset  . Alzheimer's disease Mother   . Diabetes Father    History   Social History  . Marital Status: Divorced    Spouse Name: [name]    Number of Children: [name]  . Years of Education: [name]   Social History Main Topics  . Smoking status: Never Smoker   . Smokeless tobacco: None  . Alcohol Use: No  . Drug Use: No  . Sexual Activity: None   Other Topics Concern  . None    Social History Narrative  . None   Past Surgical History  Procedure Laterality Date  . Abdominal hysterectomy    . Spine surgery    . Cholecystectomy    . Appendectomy    . Tonsillectomy    . Breast surgery      rt breast cyst  . Rotator cuff repair      right   Past Medical History  Diagnosis Date  . Myalgia   . Calcific tendonitis   . Cervical spondylosis without myelopathy   . Depression   . Pain in joint, upper arm   . Diabetes mellitus   . Hypertension   . Hiatal hernia    BP 140/59  Pulse 94  Resp 14  Ht 5\' 1"  (1.549 m)  Wt 141 lb (63.957 kg)  BMI 26.66 kg/m2  SpO2 97%     Review of Systems  Gastrointestinal: Positive for diarrhea.  Genitourinary: Positive for difficulty urinating.  Musculoskeletal: Positive for neck pain.  Neurological: Positive for weakness.  Psychiatric/Behavioral: Positive for confusion.  All other systems reviewed and are negative.       Objective:   Physical Exam  Constitutional: She is oriented to person, place, and time. She appears well-developed and well-nourished. She has gained a little weight.  HENT:  Head: Normocephalic and atraumatic.  Eyes:  Conjunctivae and EOM are normal. Pupils are equal, round, and reactive to light. Perhaps a little exopthalmos  Neck: Normal range of motion.  Cardiovascular: Normal rate, regular rhythm and normal heart sounds.  Pulmonary/Chest: Breath sounds normal. No respiratory distress. She has no wheezes.  Abdominal: Soft. Bowel sounds are normal. She exhibits no distension.  Musculoskeletal: has a  head forward posture. TP's in the right mid trap again and another area in the right mid thoracic paraspinals.  Lumbar/thoracic back: mid thoracic paraspinals are tender to touch with spasms, Low back better. H  Arms/hands, mild pain with palpation over her 3rd and 4th MCP's of the right hand. No clicking, popping or tightness was appreciated.  Posture is fair to good.  Neurological: She is  alert and oriented to person, place, and time. She has normal strength and normal reflexes. No sensory deficit. Visual acuity slightly diminished, but could read a sign across the room with her glasses. Psychiatric: She has a normal mood and affect. Her behavior is normal. Judgment and thought content normal. Mood is pleasant.   Assessment & Plan:   ASSESSMENT:  1. Fibromyalgia with myofascial pain.  2. Rotator cuff syndrome on the right.  3. History of repair was with chronic pain.  4. Cervicalgia. Post-laminectomy syndrome, facet arthropathy symptoms, associated myofascial pain and headaches  5. Depression- which has improved  7. Mid/low back pain---documented DDD on xray last month  8. Decreased bone density  9. Head and ocular pressure, decreased visual acuity--stable.---may be related to fluctuations in her sugars. No central abnormalities have been discovered on imaging. Medication SE's also have to be considered.  10. OA of right hand    PLAN:  1. Recommend voltaren gel for the right hand as well as occasional heat.   2. Hydrocodone 5/325 one-half to one p.o. B.i.d prn.  3. Hold off on referral for PT given [facility] restrictions. She will work on a HE. 4. No further follow up for eyes.   5. Follow up with me in about 2 months. 30 minutes of face to face patient care time were spent during this visit. All questions were encouraged and answered.

## 2013-10-09 DIAGNOSIS — G4700 Insomnia, unspecified: Secondary | ICD-10-CM | POA: Insufficient documentation

## 2013-10-13 ENCOUNTER — Ambulatory Visit: Payer: Medicaid Other | Admitting: Physical Medicine & Rehabilitation

## 2013-10-16 ENCOUNTER — Encounter: Payer: Medicaid Other | Attending: Physical Medicine and Rehabilitation | Admitting: Physical Medicine & Rehabilitation

## 2013-10-16 ENCOUNTER — Encounter: Payer: Self-pay | Admitting: Physical Medicine & Rehabilitation

## 2013-10-16 VITALS — BP 131/64 | HR 93 | Resp 14 | Ht 62.0 in | Wt 142.0 lb

## 2013-10-16 DIAGNOSIS — F32A Depression, unspecified: Secondary | ICD-10-CM

## 2013-10-16 DIAGNOSIS — M719 Bursopathy, unspecified: Secondary | ICD-10-CM

## 2013-10-16 DIAGNOSIS — M67919 Unspecified disorder of synovium and tendon, unspecified shoulder: Secondary | ICD-10-CM

## 2013-10-16 DIAGNOSIS — F3289 Other specified depressive episodes: Secondary | ICD-10-CM

## 2013-10-16 DIAGNOSIS — M961 Postlaminectomy syndrome, not elsewhere classified: Secondary | ICD-10-CM | POA: Insufficient documentation

## 2013-10-16 DIAGNOSIS — F329 Major depressive disorder, single episode, unspecified: Secondary | ICD-10-CM

## 2013-10-16 DIAGNOSIS — IMO0001 Reserved for inherently not codable concepts without codable children: Secondary | ICD-10-CM | POA: Insufficient documentation

## 2013-10-16 MED ORDER — MORPHINE SULFATE ER 15 MG PO TBCR: 15.0000 mg | EXTENDED_RELEASE_TABLET | Freq: Two times a day (BID) | ORAL | Status: DC

## 2013-10-16 MED ORDER — HYDROCODONE-ACETAMINOPHEN 5-325 MG PO TABS: 1.0000 | ORAL_TABLET | Freq: Four times a day (QID) | ORAL | Status: DC | PRN

## 2013-10-16 NOTE — Patient Instructions (Signed)
PLEASE CALL ME WITH ANY PROBLEMS OR QUESTIONS (#[phone]).      

## 2013-10-16 NOTE — Progress Notes (Signed)
Subjective:    Patient ID: Robin Morgan, female    DOB: Feb 11, 1956, 58 y.o.   MRN: [id number]  HPI  Robin Morgan is back regarding her chronic pain. She has been suffering with a sinus infection and increased headaches as of late. The headaches typically start at her neck and the pain radiates up to her head and down to her mid back. The headaches were rare while she was on the fentanyl patch. We stopped due to insurance reasons.   The trigger tends to be when she is more active and up and about.      Pain Inventory Average Pain 8 Pain Right Now 9 My pain is constant  In the last 24 hours, has pain interfered with the following? General activity 10 Relation with others 10 Enjoyment of life 10 What TIME of day is your pain at its worst? morning,evening,night Sleep (in general) Fair  Pain is worse with: standing Pain improves with: medication Relief from Meds: 6  Mobility walk without assistance ability to climb steps?  yes do you drive?  yes transfers alone  Function disabled: date disabled na  Neuro/Psych spasms  Prior Studies Any changes since last visit?  no  Physicians involved in your care Any changes since last visit?  no   Family History  Problem Relation Age of Onset  . Alzheimer's disease Mother   . Diabetes Father    History   Social History  . Marital Status: Divorced    Spouse Name: [name]    Number of Children: [name]  . Years of Education: [name]   Social History Main Topics  . Smoking status: Never Smoker   . Smokeless tobacco: None  . Alcohol Use: No  . Drug Use: No  . Sexual Activity: None   Other Topics Concern  . None   Social History Narrative  . None   Past Surgical History  Procedure Laterality Date  . Abdominal hysterectomy    . Spine surgery    . Cholecystectomy    . Appendectomy    . Tonsillectomy    . Breast surgery      rt breast cyst  . Rotator cuff repair      right   Past Medical History  Diagnosis Date  .  Myalgia   . Calcific tendonitis   . Cervical spondylosis without myelopathy   . Depression   . Pain in joint, upper arm   . Diabetes mellitus   . Hypertension   . Hiatal hernia    BP 131/64  Pulse 93  Resp 14  Ht 5\' 2"  (1.575 m)  Wt 142 lb (64.411 kg)  BMI 25.97 kg/m2  SpO2 97%  Opioid Risk Score:   Fall Risk Score: Moderate Fall Risk (6-13 points) (pt educated on fall risk, pt received brochure previously)    Review of Systems  Endocrine:       High and low blood sugar   Musculoskeletal: Positive for back pain and neck pain.  Neurological:       Spasms  All other systems reviewed and are negative.       Objective:   Physical Exam Constitutional: She is oriented to person, place, and time. She appears well-developed and well-nourished. She has gained a little weight.  HENT:  Head: Normocephalic and atraumatic.  Eyes: Conjunctivae and EOM are normal. Pupils are equal, round, and reactive to light. Perhaps a little exopthalmos  Neck: Normal range of motion.  Cardiovascular: Normal rate, regular rhythm and normal  heart sounds.  Pulmonary/Chest: Breath sounds normal. No respiratory distress. She has no wheezes.  Abdominal: Soft. Bowel sounds are normal. She exhibits no distension.  Musculoskeletal: tight in the right mid trap again and another area in the right mid thoracic paraspinals.  Lumbar/thoracic back: mid thoracic paraspinals are tender to touch with spasms, Low back better. H  Arms/hands, mild pain with palpation over her 3rd and 4th MCP's of the right hand. No clicking, popping or tightness was appreciated.  Posture is fair---she has more of a head forward position today.  Neurological: She is alert and oriented to person, place, and time. She has normal strength and normal reflexes. No sensory deficit. Visual acuity slightly diminished, but could read a sign across the room with her glasses. Psychiatric: She has a normal mood and affect. Her behavior is normal.  Judgment and thought content normal. Mood is pleasant.    Assessment & Plan:   ASSESSMENT:  1. Fibromyalgia with myofascial pain.  2. Rotator cuff syndrome on the right.  3. History of repair was with chronic pain.  4. Cervicalgia. Post-laminectomy syndrome, facet arthropathy symptoms, associated myofascial pain and headaches. Worse since being off fentanyl. She is having increased pain to her head and shoulders. 5. Depression- which has improved  7. Mid/low back pain---documented DDD on xray last month  8. Decreased bone density  10. OA of right hand    PLAN:  1. Recommend voltaren gel for the right hand as well as occasional heat.  2. Hydrocodone 5/325 one-half to one p.o. B.i.d prn. She should not use with the ms contin initially to gage effect.  3. Needs to work on regular posture, stretching, massage at home to neck and shoulder girdle area. 4. Will try low dose ms contin to see if this helps with her baseline headaches and spikes. .  5. Follow up with me in about 1 month. 30 minutes of face to face patient care time were spent during this visit. All questions were encouraged and answered.

## 2013-10-17 ENCOUNTER — Telehealth: Payer: Self-pay

## 2013-10-17 NOTE — Telephone Encounter (Signed)
[facility] called back and said that morphine actually needs prior authorization.  They will send request.

## 2013-10-17 NOTE — Telephone Encounter (Signed)
Per [facility] patients insurance will cover brand morphine and the pharmacy does not have it in stock.  They will order it for the patient.  Patient aware.

## 2013-10-17 NOTE — Telephone Encounter (Signed)
Patient called about her morphine rx she received for her migraines.  She said she had trouble getting it filled.  Please call.

## 2013-10-19 ENCOUNTER — Other Ambulatory Visit: Payer: Self-pay | Admitting: Physical Medicine & Rehabilitation

## 2013-11-08 ENCOUNTER — Other Ambulatory Visit: Payer: Self-pay | Admitting: *Deleted

## 2013-11-08 DIAGNOSIS — M961 Postlaminectomy syndrome, not elsewhere classified: Secondary | ICD-10-CM

## 2013-11-08 DIAGNOSIS — IMO0001 Reserved for inherently not codable concepts without codable children: Secondary | ICD-10-CM

## 2013-11-08 MED ORDER — MORPHINE SULFATE ER 15 MG PO TBCR: 15.0000 mg | EXTENDED_RELEASE_TABLET | Freq: Two times a day (BID) | ORAL | Status: DC

## 2013-11-08 MED ORDER — HYDROCODONE-ACETAMINOPHEN 5-325 MG PO TABS: 1.0000 | ORAL_TABLET | Freq: Four times a day (QID) | ORAL | Status: DC | PRN

## 2013-11-08 NOTE — Telephone Encounter (Signed)
Narcotic rxs printed for MD to sign for RN visit 11/13/13

## 2013-11-13 ENCOUNTER — Encounter: Payer: Self-pay | Admitting: *Deleted

## 2013-11-13 ENCOUNTER — Encounter: Payer: Medicaid Other | Attending: Physical Medicine and Rehabilitation | Admitting: *Deleted

## 2013-11-13 VITALS — BP 126/53 | HR 102 | Resp 14

## 2013-11-13 DIAGNOSIS — M961 Postlaminectomy syndrome, not elsewhere classified: Secondary | ICD-10-CM | POA: Insufficient documentation

## 2013-11-13 DIAGNOSIS — IMO0001 Reserved for inherently not codable concepts without codable children: Secondary | ICD-10-CM | POA: Insufficient documentation

## 2013-11-13 DIAGNOSIS — M67919 Unspecified disorder of synovium and tendon, unspecified shoulder: Secondary | ICD-10-CM

## 2013-11-13 NOTE — Progress Notes (Signed)
Here for pill count and medication refills. MS Contin 15 mg #60 Fill date 10/20/13 today NV# 60--she has not started yet but will start taking them today.  She has a headache. She is planning on starting on the medication tonight. She has had someone staying with her and did not want to take while they were there. Hydrocodone 5/325 #60 Fill date 11/02/13 #60   Today NV#50 1/2 tabs.   VSS  I have given her refills though she will not need for several weeks.  The pharmacisit will put on hold.  She will follow up next month with NP and two months with Dr Naaman Plummer.

## 2013-11-13 NOTE — Patient Instructions (Signed)
Follow up with Nurse Practioner next month and 2 months with Robin Morgan

## 2013-12-12 ENCOUNTER — Encounter: Payer: Medicaid Other | Admitting: Registered Nurse

## 2013-12-21 ENCOUNTER — Encounter: Payer: Medicaid Other | Attending: Physical Medicine and Rehabilitation | Admitting: Registered Nurse

## 2013-12-21 ENCOUNTER — Encounter: Payer: Self-pay | Admitting: Registered Nurse

## 2013-12-21 VITALS — BP 91/50 | HR 97 | Resp 14 | Ht 62.0 in | Wt 144.0 lb

## 2013-12-21 DIAGNOSIS — R51 Headache: Secondary | ICD-10-CM

## 2013-12-21 DIAGNOSIS — IMO0001 Reserved for inherently not codable concepts without codable children: Secondary | ICD-10-CM

## 2013-12-21 DIAGNOSIS — G8929 Other chronic pain: Secondary | ICD-10-CM

## 2013-12-21 DIAGNOSIS — Z79899 Other long term (current) drug therapy: Secondary | ICD-10-CM

## 2013-12-21 DIAGNOSIS — M797 Fibromyalgia: Secondary | ICD-10-CM

## 2013-12-21 DIAGNOSIS — M961 Postlaminectomy syndrome, not elsewhere classified: Secondary | ICD-10-CM

## 2013-12-21 DIAGNOSIS — Z5181 Encounter for therapeutic drug level monitoring: Secondary | ICD-10-CM

## 2013-12-21 DIAGNOSIS — M67919 Unspecified disorder of synovium and tendon, unspecified shoulder: Secondary | ICD-10-CM

## 2013-12-21 DIAGNOSIS — M719 Bursopathy, unspecified: Secondary | ICD-10-CM

## 2013-12-21 DIAGNOSIS — R519 Headache, unspecified: Secondary | ICD-10-CM

## 2013-12-21 MED ORDER — HYDROCODONE-ACETAMINOPHEN 5-325 MG PO TABS: 1.0000 | ORAL_TABLET | Freq: Four times a day (QID) | ORAL | Status: DC | PRN

## 2013-12-21 NOTE — Progress Notes (Deleted)
Subjective:    Patient ID: DAMIEN CISAR, female    DOB: 12-05-1955, 58 y.o.   MRN: [id number]  HPI: [name] is a 58 year old female who returns for follow up for chronic pain and medication refill. She says her pain is located in her neck, bilateral shoulders, middle and lower back. She rates her pain 8. Her current exercise regime is stretching exercises and walking. She is wearing a soft back brace, she uses heat therapy in conjunction with medication regime. She has notice with any  Movement she develops a migraine.  She's requesting a cortisone injection with Dr. Naaman Plummer next visit.  Pain Inventory Average Pain 9 Pain Right Now 8 My pain is constant  In the last 24 hours, has pain interfered with the following? General activity 7 Relation with others 7 Enjoyment of life 8 What TIME of day is your pain at its worst? evening, night Sleep (in general) Fair  Pain is worse with: sitting and some activites Pain improves with: medication Relief from Meds: 9  Mobility walk without assistance ability to climb steps?  yes do you drive?  yes transfers alone  Function disabled: date disabled na  Neuro/Psych numbness tingling spasms  Prior Studies Any changes since last visit?  no  Physicians involved in your care Any changes since last visit?  no   Family History  Problem Relation Age of Onset  . Alzheimer's disease Mother   . Diabetes Father    History   Social History  . Marital Status: Divorced    Spouse Name: [name]    Number of Children: [name]  . Years of Education: [name]   Social History Main Topics  . Smoking status: Never Smoker   . Smokeless tobacco: None  . Alcohol Use: No  . Drug Use: No  . Sexual Activity: None   Other Topics Concern  . None   Social History Narrative  . None   Past Surgical History  Procedure Laterality Date  . Abdominal hysterectomy    . Spine surgery    . Cholecystectomy    . Appendectomy    . Tonsillectomy     . Breast surgery      rt breast cyst  . Rotator cuff repair      right   Past Medical History  Diagnosis Date  . Myalgia   . Calcific tendonitis   . Cervical spondylosis without myelopathy   . Depression   . Pain in joint, upper arm   . Diabetes mellitus   . Hypertension   . Hiatal hernia    BP 91/50  Pulse 97  Resp 14  Ht 5\' 2"  (1.575 m)  Wt 144 lb (65.318 kg)  BMI 26.33 kg/m2  SpO2 97%  Opioid Risk Score:   Fall Risk Score: Moderate Fall Risk (6-13 points) (pt educated on fall risk, brochure given to pt previously)    Review of Systems  Gastrointestinal: Positive for diarrhea.  Endocrine:       High blood sugar  Musculoskeletal: Positive for back pain.  Neurological: Positive for numbness.       Tingling, spasms  All other systems reviewed and are negative.      Objective:   Physical Exam        Assessment & Plan:  1. Fibromyalgia with myofascial pain.  2. Rotator cuff syndrome on the right.  3. History of repair was with chronic pain.  4. Cervicalgia. Post-laminectomy syndrome, facet arthropathy: 5. Depression- which  has improved  7. Mid/low back pain---documented DDD on xray last month  8. Decreased bone density  10. OA of right hand  PLAN:  1. Recommend voltaren gel for the right hand as well as occasional heat.  2. Hydrocodone 5/325 one-half to one p.o. B.i.d prn. She should not use with the ms contin initially to gage effect.  3. Needs to work on regular posture, stretching, massage at home to neck and shoulder girdle area.     30 minutes of face to face patient care time was spent during this visit. All questions were encouraged and answered.  F/U in 1 month

## 2013-12-22 NOTE — Progress Notes (Signed)
   Subjective:    Patient ID: Robin Morgan, female    DOB: 1955-10-01, 58 y.o.   MRN: [id number]  HPI: Robin Morgan is a 58 year old female who reurns for follow up for chronic pain and medication refill. She says her pain is located in her neck, bilateral shoulders, middle and lower back. She rates her pain 8. Her current exercise regime is stretching exercises and walking. She is wearing a soft back brace, she uses heat therapy in conjunction with medication regime. She has notice with any movement she develops a migraine.  She's requesting a cortisone injection with Dr. Naaman Plummer next visit.    Review of Systems     Objective:   Physical Exam  Nursing note and vitals reviewed. Constitutional: She is oriented to person, place, and time. She appears well-developed and well-nourished.  HENT:  Head: Normocephalic and atraumatic.  Neck: Normal range of motion. Neck supple.  Denies Cervical Paraspinal tenderness: She states it feels achy  Cardiovascular: Normal rate, regular rhythm and normal heart sounds.   Pulmonary/Chest: Effort normal and breath sounds normal.  Musculoskeletal:  Normal Muscle Bulk: Muscle testing reveals: Upper Extremities: Full ROM and Muscle Strength 5/5. Bilateral AC-Joint Tenderness Noted Back without spinal or paraspinal tenderness noted. Spine flexion 45 degrees Lower Extremities: Bilateral Flexion produced a achy feeling into patella. No tenderness with palpation, no swelling noted. Arises from chair with ease. Narrow Based Gait.   Neurological: She is alert and oriented to person, place, and time.  Skin: Skin is warm and dry.  Psychiatric: She has a normal mood and affect.          Assessment & Plan:  1. Fibromyalgia with myofascial pain: Continue exercise and heat therapy. 2. Rotator cuff syndrome on the right: Continue with stretching exercises and heat therapy.  3. Cervicalgia. Post-laminectomy syndrome, facet arthropathy: Plan  cortisone injection with next visit with Dr. Naaman Plummer.  Continue Voltaren gel. Refilled: MS Contin 15 mg one tablet every 12 hours as needed #60 4. Depression: On Cymbalta 5. Mid/low back pain: Continue Current Medication and stretching and heat therapy. 6 OA of right hand: Encourage to use Voltaren gel and heat therapy 7. Migraines: Hydrocodone 5/325 mg one tablet every 6 hours as needed #60.   30 minutes of face to face patient care time was spent during this visit. All questions were encouraged and answered.

## 2013-12-25 NOTE — Progress Notes (Signed)
   Subjective:    Patient ID: Robin Morgan, female    DOB: [birth year], 58 y.o.   MRN: [id number]  HPI: See previous HPI  Pain Inventory Average Pain 9 Pain Right Now 8 My pain is constant  In the last 24 hours, has pain interfered with the following? General activity 7 Relation with others 7 Enjoyment of life 8 What TIME of day is your pain at its worst? evening, night Sleep (in general) Fair  Pain is worse with: sitting and some activites Pain improves with: medication Relief from Meds: 9  Mobility walk without assistance ability to climb steps?  yes do you drive?  yes transfers alone  Function disabled: date disabled na  Neuro/Psych numbness tingling spasms  Prior Studies Any changes since last visit?  no  Physicians involved in your care Any changes since last visit?  no   Family History  Problem Relation Age of Onset  . Alzheimer's disease Mother   . Diabetes Father    History   Social History  . Marital Status: Divorced    Spouse Name: [name]    Number of Children: [name]  . Years of Education: [name]   Social History Main Topics  . Smoking status: Never Smoker   . Smokeless tobacco: None  . Alcohol Use: No  . Drug Use: No  . Sexual Activity: None   Other Topics Concern  . None   Social History Narrative  . None   Past Surgical History  Procedure Laterality Date  . Abdominal hysterectomy    . Spine surgery    . Cholecystectomy    . Appendectomy    . Tonsillectomy    . Breast surgery      rt breast cyst  . Rotator cuff repair      right   Past Medical History  Diagnosis Date  . Myalgia   . Calcific tendonitis   . Cervical spondylosis without myelopathy   . Depression   . Pain in joint, upper arm   . Diabetes mellitus   . Hypertension   . Hiatal hernia    BP 91/50  Pulse 97  Resp 14  Ht 5\' 2"  (1.575 m)  Wt 144 lb (65.318 kg)  BMI 26.33 kg/m2  SpO2 97%  Opioid Risk Score:   Fall Risk Score: Moderate Fall Risk (6-13  points) (pt educated on fall risk, brochure given to pt previously)    Review of Systems  Gastrointestinal: Positive for diarrhea.  Endocrine:       High blood sugar  Musculoskeletal: Positive for back pain.  Neurological: Positive for numbness.       Tingling, spasms  All other systems reviewed and are negative.      Objective:   Physical Exam        Assessment & Plan:  See Previous Assessment and plan  30 minutes of face to face patient care time was spent during this visit. All questions were encouraged and answered.  F/U in 1 month

## 2014-01-09 DIAGNOSIS — N951 Menopausal and female climacteric states: Secondary | ICD-10-CM | POA: Insufficient documentation

## 2014-01-15 ENCOUNTER — Encounter: Payer: Self-pay | Admitting: Physical Medicine & Rehabilitation

## 2014-01-15 ENCOUNTER — Encounter: Payer: Medicaid Other | Attending: Physical Medicine and Rehabilitation | Admitting: Physical Medicine & Rehabilitation

## 2014-01-15 VITALS — BP 131/66 | HR 97 | Resp 14 | Wt 143.8 lb

## 2014-01-15 DIAGNOSIS — M5136 Other intervertebral disc degeneration, lumbar region: Secondary | ICD-10-CM

## 2014-01-15 DIAGNOSIS — M51379 Other intervertebral disc degeneration, lumbosacral region without mention of lumbar back pain or lower extremity pain: Secondary | ICD-10-CM | POA: Insufficient documentation

## 2014-01-15 DIAGNOSIS — M719 Bursopathy, unspecified: Secondary | ICD-10-CM

## 2014-01-15 DIAGNOSIS — M961 Postlaminectomy syndrome, not elsewhere classified: Secondary | ICD-10-CM | POA: Insufficient documentation

## 2014-01-15 DIAGNOSIS — M5137 Other intervertebral disc degeneration, lumbosacral region: Secondary | ICD-10-CM

## 2014-01-15 DIAGNOSIS — M67919 Unspecified disorder of synovium and tendon, unspecified shoulder: Secondary | ICD-10-CM

## 2014-01-15 DIAGNOSIS — IMO0001 Reserved for inherently not codable concepts without codable children: Secondary | ICD-10-CM

## 2014-01-15 MED ORDER — HYDROCODONE-ACETAMINOPHEN 5-325 MG PO TABS: 1.0000 | ORAL_TABLET | Freq: Four times a day (QID) | ORAL | Status: DC | PRN

## 2014-01-15 NOTE — Patient Instructions (Signed)
PLEASE CALL ME WITH ANY PROBLEMS OR QUESTIONS (#[phone]).      

## 2014-01-15 NOTE — Progress Notes (Signed)
Subjective:    Patient ID: Robin Morgan, female    DOB: 1955/12/07, 58 y.o.   MRN: [id number]  HPI  Eugenie is back regarding her chronic pain. Her pain is most severe in her upper low back . It is more severe with standing and most basic activity. The pain seems to have intensified over the last 3 weeks.  It may feel a little better when she sits down. Her hands may be doing a little better.    She started the ms contin with good results, but didn't take it on a scheduled basis because she wasn't sure that she should use it that way. She is still using the hydrocodone prn.     Pain Inventory Average Pain 9 Pain Right Now 8 My pain is constant, stabbing and aching  In the last 24 hours, has pain interfered with the following? General activity 8 Relation with others 8 Enjoyment of life 9 What TIME of day is your pain at its worst? all Sleep (in general) Poor  Pain is worse with: bending and sitting Pain improves with: medication Relief from Meds: 9  Mobility ability to climb steps?  yes do you drive?  yes  Function disabled: date disabled .  Neuro/Psych bladder control problems numbness tingling spasms  Prior Studies Any changes since last visit?  no  Physicians involved in your care Any changes since last visit?  no   Family History  Problem Relation Age of Onset  . Alzheimer's disease Mother   . Diabetes Father    History   Social History  . Marital Status: Divorced    Spouse Name: [name]    Number of Children: [name]  . Years of Education: [name]   Social History Main Topics  . Smoking status: Never Smoker   . Smokeless tobacco: None  . Alcohol Use: No  . Drug Use: No  . Sexual Activity: None   Other Topics Concern  . None   Social History Narrative  . None   Past Surgical History  Procedure Laterality Date  . Abdominal hysterectomy    . Spine surgery    . Cholecystectomy    . Appendectomy    . Tonsillectomy    . Breast surgery     rt breast cyst  . Rotator cuff repair      right   Past Medical History  Diagnosis Date  . Myalgia   . Calcific tendonitis   . Cervical spondylosis without myelopathy   . Depression   . Pain in joint, upper arm   . Diabetes mellitus   . Hypertension   . Hiatal hernia    BP 131/66  Pulse 97  Resp 14  Wt 143 lb 12.8 oz (65.227 kg)  SpO2 98%  Opioid Risk Score:   Fall Risk Score: Moderate Fall Risk (6-13 points) (educated and handout for fall prevention in the home given at previous visit) Review of Systems  Genitourinary:       Bladder control problems  Musculoskeletal: Positive for back pain.       Spasms  Neurological: Positive for numbness.       Tingling  All other systems reviewed and are negative.      Objective:   Physical Exam Constitutional: She is oriented to person, place, and time. She appears well-developed and well-nourished. She has gained a little weight.  HENT:  Head: Normocephalic and atraumatic.  Eyes: Conjunctivae and EOM are normal. Pupils are equal, round, and reactive to light.  Perhaps a little exopthalmos  Neck: Normal range of motion.  Cardiovascular: Normal rate, regular rhythm and normal heart sounds.  Pulmonary/Chest: Breath sounds normal. No respiratory distress. She has no wheezes.  Abdominal: Soft. Bowel sounds are normal. She exhibits no distension.  Musculoskeletal: increased tenderness along the upper lumbar spine. Right paraspinals more tender than the left. Pain increases with flexion and lateral bending. Arms/hands, mild pain with palpation over her 3rd and 4th MCP's of the right hand. No clicking, popping or tightness was appreciated.   Neurological: She is alert and oriented to person, place, and time. She has normal strength and normal reflexes. No sensory deficit. Visual acuity slightly diminished, but could read a sign across the room with her glasses. Psychiatric: She has a normal mood and affect. Her behavior is normal. Judgment  and thought content normal. Mood is pleasant.  Assessment & Plan:   ASSESSMENT:  1. Fibromyalgia with myofascial pain.  2. Rotator cuff syndrome on the right.  3. History of repair was with chronic pain.  4. Cervicalgia. Post-laminectomy syndrome, facet arthropathy symptoms, associated myofascial pain and headaches. Worse since being off fentanyl. She is having increased pain to her head and shoulders.  5. Depression- which has improved  7. Mid/low back pain---documented DDD particularly in the upper lumbar spine.  8. Decreased bone density  10. OA of right hand    PLAN:  1. Continue voltaren gel for the right hand.  2. Hydrocodone 5/325 one-half to one p.o. B.i.d prn.  3. MRI of the lumbar spine to assess the L1-2 and L2-3 disks.  4. Retry low dose ms contin. She will take it initially at night. If too sedating, she was instructed to just stop the medication 5. Follow up with my NP in about 1 month pending the MRI. 30 minutes of face to face patient care time were spent during this visit. All questions were encouraged and answered.

## 2014-01-16 ENCOUNTER — Telehealth: Payer: Self-pay

## 2014-01-16 NOTE — Telephone Encounter (Signed)
Please refill x 3

## 2014-01-16 NOTE — Telephone Encounter (Signed)
Pharmacy request received to refill patient's Cyclobenzaprine. Last fill date 10/07/12. Please advise.

## 2014-01-17 MED ORDER — CYCLOBENZAPRINE HCL 10 MG PO TABS: 10.0000 mg | ORAL_TABLET | Freq: Three times a day (TID) | ORAL | Status: DC | PRN

## 2014-01-17 NOTE — Telephone Encounter (Signed)
Flexeril refill escribed to pharmacy.

## 2014-03-06 ENCOUNTER — Encounter: Payer: Self-pay | Admitting: Physical Medicine & Rehabilitation

## 2014-03-06 ENCOUNTER — Encounter: Payer: Medicaid Other | Attending: Physical Medicine and Rehabilitation | Admitting: Physical Medicine & Rehabilitation

## 2014-03-06 VITALS — BP 124/61 | HR 95 | Resp 14 | Ht 62.0 in | Wt 142.0 lb

## 2014-03-06 DIAGNOSIS — M961 Postlaminectomy syndrome, not elsewhere classified: Secondary | ICD-10-CM | POA: Insufficient documentation

## 2014-03-06 DIAGNOSIS — IMO0001 Reserved for inherently not codable concepts without codable children: Secondary | ICD-10-CM | POA: Diagnosis present

## 2014-03-06 DIAGNOSIS — M5136 Other intervertebral disc degeneration, lumbar region: Secondary | ICD-10-CM

## 2014-03-06 DIAGNOSIS — M5137 Other intervertebral disc degeneration, lumbosacral region: Secondary | ICD-10-CM

## 2014-03-06 MED ORDER — MORPHINE SULFATE ER 15 MG PO TBCR: 15.0000 mg | EXTENDED_RELEASE_TABLET | Freq: Two times a day (BID) | ORAL | Status: DC

## 2014-03-06 MED ORDER — HYDROCODONE-ACETAMINOPHEN 5-325 MG PO TABS: 1.0000 | ORAL_TABLET | Freq: Four times a day (QID) | ORAL | Status: DC | PRN

## 2014-03-06 NOTE — Progress Notes (Signed)
Subjective:    Patient ID: Robin Morgan, female    DOB: July 21, 1956, 58 y.o.   MRN: [id number]  HPI  Mrs Mcnorton is back regarding her chronic pain. She has had a few headaches recently associated with her neck pain. The pain has radiated into her head and down into her right shoulder blade most commonly. She uses some heat and ice which helps a little. Typically she just waits out the pain.  The ms contin was very helpful for her low back. She did notice some constipation with the medication however and she's had to be more regular with fruits,liquids, stool softeners.     Pain Inventory Average Pain 10 Pain Right Now 8 My pain is sharp and aching  In the last 24 hours, has pain interfered with the following? General activity 8 Relation with others 9 Enjoyment of life 8 What TIME of day is your pain at its worst? evening Sleep (in general) Fair  Pain is worse with: sitting Pain improves with: heat/ice and medication Relief from Meds: 9  Mobility ability to climb steps?  yes do you drive?  yes  Function disabled: date disabled .  Neuro/Psych bladder control problems bowel control problems numbness  Prior Studies Any changes since last visit?  no  Physicians involved in your care Any changes since last visit?  no   Family History  Problem Relation Age of Onset  . Alzheimer's disease Mother   . Diabetes Father    History   Social History  . Marital Status: Divorced    Spouse Name: [name]    Number of Children: [name]  . Years of Education: [name]   Social History Main Topics  . Smoking status: Never Smoker   . Smokeless tobacco: None  . Alcohol Use: No  . Drug Use: No  . Sexual Activity: None   Other Topics Concern  . None   Social History Narrative  . None   Past Surgical History  Procedure Laterality Date  . Abdominal hysterectomy    . Spine surgery    . Cholecystectomy    . Appendectomy    . Tonsillectomy    . Breast surgery      rt breast  cyst  . Rotator cuff repair      right   Past Medical History  Diagnosis Date  . Myalgia   . Calcific tendonitis   . Cervical spondylosis without myelopathy   . Depression   . Pain in joint, upper arm   . Diabetes mellitus   . Hypertension   . Hiatal hernia    BP 124/61  Pulse 95  Resp 14  Ht 5\' 2"  (1.575 m)  Wt 142 lb (64.411 kg)  BMI 25.97 kg/m2  SpO2 95%  Opioid Risk Score:   Fall Risk Score: Moderate Fall Risk (6-13 points) (patient educated handout declined)   Review of Systems  Respiratory: Positive for shortness of breath.   Gastrointestinal: Positive for constipation.  Genitourinary: Positive for difficulty urinating.  Musculoskeletal: Positive for gait problem.  Neurological: Positive for numbness.  All other systems reviewed and are negative.      Objective:   Physical Exam Constitutional: She is oriented to person, place, and time. She appears well-developed and well-nourished. She has gained a little weight.  HENT:  Head: Normocephalic and atraumatic.  Eyes: Conjunctivae and EOM are normal. Pupils are equal, round, and reactive to light. Perhaps a little exopthalmos  Neck: Normal range of motion.  Cardiovascular: Normal rate,  regular rhythm and normal heart sounds.  Pulmonary/Chest: Breath sounds normal. No respiratory distress. She has no wheezes.  Abdominal: Soft. Bowel sounds are normal. She exhibits no distension.  Musculoskeletal: lumbar paraspinals less tender. Right upper trap quite and tender to palpation. reprodced symptoms with deeper pressure.   Neurological: She is alert and oriented to person, place, and time. She has normal strength and normal reflexes. No sensory deficit. Visual acuity slightly diminished, but could read a sign across the room with her glasses. Psychiatric: She has a normal mood and affect. Her behavior is normal. Judgment and thought content normal. Mood is pleasant as always  Assessment & Plan:   ASSESSMENT:  1.  Fibromyalgia with myofascial pain.  2. Rotator cuff syndrome on the right.  3. History of repair was with chronic pain.  4. Cervicalgia. Post-laminectomy syndrome, facet arthropathy symptoms, associated myofascial pain and headaches.  She is having increased pain to her head and shoulders which is in great part myofascially based at this time. Trap quite affected today..  5. Depression- which has improved  7. Mid/low back pain-DDD.  8. Decreased bone density  10. OA of right hand    PLAN:   1. Continue voltaren gel for the right hand and cervical spine. Encouraged appropriate posture, ROM, use of muscle relaxants, etc  2. Hydrocodone 5/325 one-half to one p.o. B.i.d prn.  3. After informed consent and preparation of the skin with isopropyl alcohol, I injected the right upper trap  with 2cc of 1% lidocaine. The patient tolerated well, and no complications were experienced. Post-injection instructions were provided.   4. Continue low dose ms contin.  Discussed keeping up with her bowel habits to avoid constipation.  5. Follow up with my NP. i can see her in about 2 months.  30 minutes of face to face patient care time were spent during this visit. All questions were encouraged and answered.

## 2014-03-06 NOTE — Patient Instructions (Signed)
PLEASE WORK ON MASSAGE AND APPROPRIATE STRETCHING EVERY DAY.

## 2014-04-05 ENCOUNTER — Encounter: Payer: Self-pay | Admitting: Registered Nurse

## 2014-04-05 ENCOUNTER — Encounter: Payer: Medicaid Other | Attending: Physical Medicine and Rehabilitation | Admitting: Registered Nurse

## 2014-04-05 VITALS — BP 121/57 | HR 88 | Resp 14 | Wt 141.8 lb

## 2014-04-05 DIAGNOSIS — M797 Fibromyalgia: Secondary | ICD-10-CM

## 2014-04-05 DIAGNOSIS — Z5181 Encounter for therapeutic drug level monitoring: Secondary | ICD-10-CM

## 2014-04-05 DIAGNOSIS — R51 Headache: Secondary | ICD-10-CM

## 2014-04-05 DIAGNOSIS — G8929 Other chronic pain: Secondary | ICD-10-CM

## 2014-04-05 DIAGNOSIS — IMO0001 Reserved for inherently not codable concepts without codable children: Secondary | ICD-10-CM | POA: Diagnosis present

## 2014-04-05 DIAGNOSIS — M961 Postlaminectomy syndrome, not elsewhere classified: Secondary | ICD-10-CM

## 2014-04-05 DIAGNOSIS — R519 Headache, unspecified: Secondary | ICD-10-CM

## 2014-04-05 DIAGNOSIS — Z79899 Other long term (current) drug therapy: Secondary | ICD-10-CM

## 2014-04-05 MED ORDER — MORPHINE SULFATE ER 15 MG PO TBCR: 15.0000 mg | EXTENDED_RELEASE_TABLET | Freq: Two times a day (BID) | ORAL | Status: DC

## 2014-04-05 NOTE — Progress Notes (Signed)
Subjective:    Patient ID: Robin Morgan, female    DOB: 1956-01-12, 58 y.o.   MRN: [id number]  HPI: Robin Morgan is a 58 year old female who reurns for follow up for chronic pain and medication refill. She says her pain is located in her neck, and right shoulders. Also complaining of a headache. She rates her pain 8. Her current exercise regime is walking.   Pain Inventory Average Pain 8 Pain Right Now 8 My pain is aching  In the last 24 hours, has pain interfered with the following? General activity 8 Relation with others 8 Enjoyment of life 8 What TIME of day is your pain at its worst? evening and night Sleep (in general) Fair  Pain is worse with: sitting and some activites Pain improves with: medication Relief from Meds: 6  Mobility walk without assistance ability to climb steps?  yes do you drive?  yes  Function disabled: date disabled .  Neuro/Psych numbness spasms  Prior Studies Any changes since last visit?  no  Physicians involved in your care Any changes since last visit?  no   Family History  Problem Relation Age of Onset  . Alzheimer's disease Mother   . Diabetes Father    History   Social History  . Marital Status: Divorced    Spouse Name: [name]    Number of Children: [name]  . Years of Education: [name]   Social History Main Topics  . Smoking status: Never Smoker   . Smokeless tobacco: None  . Alcohol Use: No  . Drug Use: No  . Sexual Activity: None   Other Topics Concern  . None   Social History Narrative  . None   Past Surgical History  Procedure Laterality Date  . Abdominal hysterectomy    . Spine surgery    . Cholecystectomy    . Appendectomy    . Tonsillectomy    . Breast surgery      rt breast cyst  . Rotator cuff repair      right   Past Medical History  Diagnosis Date  . Myalgia   . Calcific tendonitis   . Cervical spondylosis without myelopathy   . Depression   . Pain in joint, upper arm   . Diabetes  mellitus   . Hypertension   . Hiatal hernia    BP 121/57  Pulse 88  Resp 14  Wt 141 lb 12.8 oz (64.32 kg)  SpO2 98%  Opioid Risk Score:   Fall Risk Score: Moderate Fall Risk (6-13 points) (previoulsy educated and given handout)   Review of Systems  Constitutional: Positive for diaphoresis.  Endocrine:       High blood sugars  Musculoskeletal:       Spasms  Neurological: Positive for numbness.  All other systems reviewed and are negative.      Objective:   Physical Exam  Nursing note and vitals reviewed. Constitutional: She is oriented to person, place, and time. She appears well-developed and well-nourished.  HENT:  Head: Normocephalic and atraumatic.  Neck: Normal range of motion. Neck supple.  Cardiovascular: Normal rate and regular rhythm.   Pulmonary/Chest: Effort normal and breath sounds normal.  Musculoskeletal:  Normal Muscle Bul and Muscle Testing Reveals: Upper Extremities: Full ROM and Muscle Strength 5/5 Right AC Joint Tenderness Thoracic Paraspinal Tenderness: T-1- T-5 Lower Extremities: Full ROM and Muscle Strength 5/5 Arises from chair with ease Narrow Based Gait  Neurological: She is alert and oriented to  person, place, and time.  Skin: Skin is warm and dry.  Psychiatric: She has a normal mood and affect.          Assessment & Plan:  1. Fibromyalgia with myofascial pain: Continue exercise and heat therapy.  2. Rotator cuff syndrome on the right: Continue with stretching exercises and heat therapy.  3. Cervicalgia. Post-laminectomy syndrome, facet arthropathy: S/P cortisone injection with relief noted.  Continue Voltaren gel. Refilled: MS Contin 15 mg one tablet every 12 hours as needed #60  4. Depression: On Cymbalta  5. Mid/low back pain: Continue Current Medication and stretching and heat therapy.  6 OA of right hand: Encourage to use Voltaren gel and heat therapy  7. Migraines: Continue Hydrocodone 5/325 mg one tablet every 6 hours as needed  #60. No script printed  15 minutes of face to face patient care time was spent during this visit. All questions were encouraged and answered.   F/U in 1 month

## 2014-04-27 ENCOUNTER — Other Ambulatory Visit: Payer: Self-pay | Admitting: Physical Medicine & Rehabilitation

## 2014-05-02 ENCOUNTER — Encounter: Payer: Self-pay | Admitting: Registered Nurse

## 2014-05-02 ENCOUNTER — Encounter: Payer: Medicaid Other | Attending: Physical Medicine and Rehabilitation | Admitting: Registered Nurse

## 2014-05-02 VITALS — BP 128/70 | HR 70 | Resp 14 | Ht 62.0 in | Wt 145.2 lb

## 2014-05-02 DIAGNOSIS — G8929 Other chronic pain: Secondary | ICD-10-CM

## 2014-05-02 DIAGNOSIS — R519 Headache, unspecified: Secondary | ICD-10-CM

## 2014-05-02 DIAGNOSIS — M797 Fibromyalgia: Secondary | ICD-10-CM

## 2014-05-02 DIAGNOSIS — R51 Headache: Secondary | ICD-10-CM

## 2014-05-02 DIAGNOSIS — M961 Postlaminectomy syndrome, not elsewhere classified: Secondary | ICD-10-CM | POA: Insufficient documentation

## 2014-05-02 DIAGNOSIS — IMO0001 Reserved for inherently not codable concepts without codable children: Secondary | ICD-10-CM | POA: Insufficient documentation

## 2014-05-02 DIAGNOSIS — Z79899 Other long term (current) drug therapy: Secondary | ICD-10-CM

## 2014-05-02 DIAGNOSIS — Z5181 Encounter for therapeutic drug level monitoring: Secondary | ICD-10-CM

## 2014-05-02 MED ORDER — MORPHINE SULFATE ER 15 MG PO TBCR: 15.0000 mg | EXTENDED_RELEASE_TABLET | Freq: Two times a day (BID) | ORAL | Status: DC

## 2014-05-02 NOTE — Progress Notes (Signed)
Subjective:    Patient ID: Robin Morgan, female    DOB: 09/10/1955, 58 y.o.   MRN: [id number]  HPI: Robin Morgan is a 58 year old female who reurns for follow up for chronic pain and medication refill. She says her pain is located in her neck, right shoulder, and mid-back.  She rates her pain 8. Her current exercise regime is walking.  She says over the week end she had a Fibro Headache, it's resolving at this time.  Pain Inventory Average Pain 8 Pain Right Now 8 My pain is constant and aching  In the last 24 hours, has pain interfered with the following? General activity 7 Relation with others 8 Enjoyment of life 8 What TIME of day is your pain at its worst? all Sleep (in general) Fair  Pain is worse with: unsure Pain improves with: heat/ice and medication Relief from Meds: 5  Mobility walk without assistance  Function disabled: date disabled .  Neuro/Psych bladder control problems numbness tingling spasms  Prior Studies Any changes since last visit?  no  Physicians involved in your care Any changes since last visit?  no   Family History  Problem Relation Age of Onset  . Alzheimer's disease Mother   . Diabetes Father    History   Social History  . Marital Status: Divorced    Spouse Name: [name]    Number of Children: [name]  . Years of Education: [name]   Social History Main Topics  . Smoking status: Never Smoker   . Smokeless tobacco: None  . Alcohol Use: No  . Drug Use: No  . Sexual Activity: None   Other Topics Concern  . None   Social History Narrative  . None   Past Surgical History  Procedure Laterality Date  . Abdominal hysterectomy    . Spine surgery    . Cholecystectomy    . Appendectomy    . Tonsillectomy    . Breast surgery      rt breast cyst  . Rotator cuff repair      right   Past Medical History  Diagnosis Date  . Myalgia   . Calcific tendonitis   . Cervical spondylosis without myelopathy   . Depression   .  Pain in joint, upper arm   . Diabetes mellitus   . Hypertension   . Hiatal hernia    BP 128/70  Pulse 70  Resp 14  Ht 5\' 2"  (1.575 m)  Wt 145 lb 3.2 oz (65.862 kg)  BMI 26.55 kg/m2  SpO2 95%  Opioid Risk Score:   Fall Risk Score: Low Fall Risk (0-5 points)   Review of Systems     Objective:   Physical Exam  Nursing note and vitals reviewed. Constitutional: She is oriented to person, place, and time. She appears well-developed and well-nourished.  HENT:  Head: Normocephalic and atraumatic.  Neck: Normal range of motion. Neck supple.  Cardiovascular: Normal rate and regular rhythm.   Pulmonary/Chest: Effort normal and breath sounds normal.  Musculoskeletal:  Normal Muscle Bulk and Muscle Testing Reveals: Upper Extremities: Full ROM and Muscle Strength 5/5 Thoracic Paraspinal Tenderness: T-7- T-9 Right Spine of Scapula Tenderness Lower extremities: Full ROM and Muscle Strength 5/5 Arises from chair with ease Narrow Based Gait  Neurological: She is alert and oriented to person, place, and time.  Skin: Skin is warm and dry.  Psychiatric: She has a normal mood and affect.  Assessment & Plan:  1. Fibromyalgia with myofascial pain: Continue exercise and heat therapy.  2. Rotator cuff syndrome on the right: Continue with stretching exercises and heat therapy.  3. Cervicalgia. Post-laminectomy syndrome, facet arthropathy:  Continue Voltaren gel. Refilled: MS Contin 15 mg one tablet every 12 hours as needed #60  4. Depression: On Cymbalta  5. Mid/low back pain: Continue Current Medication and stretching and heat therapy.  6 OA of right hand: Encourage to use Voltaren gel and heat therapy  7. Migraines: Continue Hydrocodone 5/325 mg one tablet every 6 hours as needed #60. No script printed   15 minutes of face to face patient care time was spent during this visit. All questions were encouraged and answered.   F/U in 1 month

## 2014-05-03 ENCOUNTER — Ambulatory Visit: Payer: Medicaid Other | Admitting: Registered Nurse

## 2014-06-04 ENCOUNTER — Encounter: Payer: Self-pay | Admitting: Physical Medicine & Rehabilitation

## 2014-06-04 ENCOUNTER — Encounter: Payer: Medicaid Other | Attending: Physical Medicine and Rehabilitation | Admitting: Physical Medicine & Rehabilitation

## 2014-06-04 VITALS — BP 130/68 | HR 92 | Resp 14 | Ht 62.0 in | Wt 143.2 lb

## 2014-06-04 DIAGNOSIS — M961 Postlaminectomy syndrome, not elsewhere classified: Secondary | ICD-10-CM | POA: Diagnosis present

## 2014-06-04 DIAGNOSIS — IMO0001 Reserved for inherently not codable concepts without codable children: Secondary | ICD-10-CM

## 2014-06-04 DIAGNOSIS — M609 Myositis, unspecified: Secondary | ICD-10-CM

## 2014-06-04 DIAGNOSIS — M67911 Unspecified disorder of synovium and tendon, right shoulder: Secondary | ICD-10-CM

## 2014-06-04 DIAGNOSIS — M791 Myalgia: Secondary | ICD-10-CM | POA: Insufficient documentation

## 2014-06-04 DIAGNOSIS — M5136 Other intervertebral disc degeneration, lumbar region: Secondary | ICD-10-CM

## 2014-06-04 MED ORDER — HYDROCODONE-ACETAMINOPHEN 5-325 MG PO TABS: 1.0000 | ORAL_TABLET | Freq: Four times a day (QID) | ORAL | Status: DC | PRN

## 2014-06-04 MED ORDER — MORPHINE SULFATE ER 15 MG PO TBCR: 15.0000 mg | EXTENDED_RELEASE_TABLET | Freq: Two times a day (BID) | ORAL | Status: DC

## 2014-06-04 NOTE — Progress Notes (Signed)
Subjective:    Patient ID: Robin Morgan, female    DOB: [birth year], 58 y.o.   MRN: [id number]  HPI  Gail is back regarding her chronic pain. She is doing fairly well overall. She has some difficulties with spasms in her neck and hamstrings. She tries to keep up with strethcing but probably doesn't do as much as she should.   Stress levels have been better at home which have seemed to help also.  She remains compliant with her hydrocodone. Bowel and bladder function are normal. Sleep has been reasonable.   Pain Inventory Average Pain 7 Pain Right Now 7 My pain is constant, stabbing and aching  In the last 24 hours, has pain interfered with the following? General activity 7 Relation with others 7 Enjoyment of life 7 What TIME of day is your pain at its worst? evening and night Sleep (in general) Fair  Pain is worse with: sitting and standing Pain improves with: medication Relief from Meds: 8  Mobility walk without assistance how many minutes can you walk? 15 ability to climb steps?  yes do you drive?  yes  Function disabled: date disabled .  Neuro/Psych bladder control problems bowel control problems numbness spasms  Prior Studies Any changes since last visit?  no  Physicians involved in your care Any changes since last visit?  no   Family History  Problem Relation Age of Onset  . Alzheimer's disease Mother   . Diabetes Father    History   Social History  . Marital Status: Divorced    Spouse Name: [name]    Number of Children: [name]  . Years of Education: [name]   Social History Main Topics  . Smoking status: Never Smoker   . Smokeless tobacco: None  . Alcohol Use: No  . Drug Use: No  . Sexual Activity: None   Other Topics Concern  . None   Social History Narrative  . None   Past Surgical History  Procedure Laterality Date  . Abdominal hysterectomy    . Spine surgery    . Cholecystectomy    . Appendectomy    . Tonsillectomy    .  Breast surgery      rt breast cyst  . Rotator cuff repair      right   Past Medical History  Diagnosis Date  . Myalgia   . Calcific tendonitis   . Cervical spondylosis without myelopathy   . Depression   . Pain in joint, upper arm   . Diabetes mellitus   . Hypertension   . Hiatal hernia    BP 130/68  Pulse 92  Resp 14  Ht 5\' 2"  (1.575 m)  Wt 143 lb 3.2 oz (64.955 kg)  BMI 26.18 kg/m2  SpO2 96%  Opioid Risk Score:   Fall Risk Score: Low Fall Risk (0-5 points)   Review of Systems     Objective:   Physical Exam  Constitutional: She is oriented to person, place, and time. She appears well-developed and well-nourished. She has gained a little weight.  HENT:  Head: Normocephalic and atraumatic.  Eyes: Conjunctivae and EOM are normal. Pupils are equal, round, and reactive to light. Perhaps a little exopthalmos  Neck: Normal range of motion.  Cardiovascular: Normal rate, regular rhythm and normal heart sounds.  Pulmonary/Chest: Breath sounds normal. No respiratory distress. She has no wheezes.  Abdominal: Soft. Bowel sounds are normal. She exhibits no distension.  Musculoskeletal: left shoulder girdle and hamstrings are a little tight.  Gait is normal. Neurological: She is alert and oriented to person, place, and time. She has normal strength and normal reflexes. No sensory deficit.  Psychiatric: She has a normal mood and affect. Her behavior is normal. Judgment and thought content normal. Mood is pleasant as always    Assessment & Plan:   ASSESSMENT:  1. Fibromyalgia with myofascial pain.  2. Rotator cuff syndrome on the right.   3. History of repair was with chronic pain.  4. Cervicalgia. Post-laminectomy syndrome, facet arthropathy symptoms, associated myofascial pain and headaches. She is having increased pain to her head and shoulders which is in great part myofascially based at this time. Trap quite affected today..  5. Depression- which has improved  7. Mid/low  back pain-DDD.  8. Decreased bone density  10. OA of right hand    PLAN:  1. Continue with HEP and regular stretching, good posture, etc.  Reviewed hamstring stretches today 2. Hydrocodone 5/325 one-half to one p.o. B.i.d prn. #60---a second rx was provided for next month.  3. voltaren gel to hands prn.  4. Continue low dose ms contin.    5. Follow up with me in about 2 months. 15 minutes of face to face patient care time were spent during this visit. All questions were encouraged and answered.

## 2014-06-04 NOTE — Patient Instructions (Signed)
PLEASE CALL ME WITH ANY PROBLEMS OR QUESTIONS (#[phone]).      

## 2014-06-06 ENCOUNTER — Other Ambulatory Visit: Payer: Self-pay | Admitting: Family Medicine

## 2014-06-06 DIAGNOSIS — Z1231 Encounter for screening mammogram for malignant neoplasm of breast: Secondary | ICD-10-CM

## 2014-07-04 ENCOUNTER — Ambulatory Visit: Payer: Medicaid Other

## 2014-07-27 ENCOUNTER — Encounter: Payer: Medicaid Other | Attending: Physical Medicine and Rehabilitation | Admitting: Physical Medicine & Rehabilitation

## 2014-07-27 ENCOUNTER — Encounter: Payer: Self-pay | Admitting: Physical Medicine & Rehabilitation

## 2014-07-27 VITALS — BP 118/64 | HR 105 | Resp 14

## 2014-07-27 DIAGNOSIS — M67911 Unspecified disorder of synovium and tendon, right shoulder: Secondary | ICD-10-CM

## 2014-07-27 DIAGNOSIS — M791 Myalgia: Secondary | ICD-10-CM | POA: Insufficient documentation

## 2014-07-27 DIAGNOSIS — M961 Postlaminectomy syndrome, not elsewhere classified: Secondary | ICD-10-CM | POA: Diagnosis present

## 2014-07-27 DIAGNOSIS — F32A Depression, unspecified: Secondary | ICD-10-CM

## 2014-07-27 DIAGNOSIS — IMO0001 Reserved for inherently not codable concepts without codable children: Secondary | ICD-10-CM

## 2014-07-27 DIAGNOSIS — F329 Major depressive disorder, single episode, unspecified: Secondary | ICD-10-CM

## 2014-07-27 DIAGNOSIS — M609 Myositis, unspecified: Secondary | ICD-10-CM | POA: Diagnosis present

## 2014-07-27 MED ORDER — HYDROCODONE-ACETAMINOPHEN 5-325 MG PO TABS: 1.0000 | ORAL_TABLET | Freq: Four times a day (QID) | ORAL | Status: DC | PRN

## 2014-07-27 MED ORDER — MORPHINE SULFATE ER 15 MG PO TBCR: 15.0000 mg | EXTENDED_RELEASE_TABLET | Freq: Two times a day (BID) | ORAL | Status: DC

## 2014-07-27 NOTE — Progress Notes (Signed)
Subjective:    Patient ID: Robin Morgan, female    DOB: 11-15-1955, 58 y.o.   MRN: [id number]  HPI Mrs. Pack is back regarding her chronic pain. Over the last couple months, she's had more pain in her right shoulder girdle and neck. She has tried some stretching but it doesn't seem to help.  Otherwise her pain has been fairly well controlled. She continues with her pain medications as prescribed.     Pain Inventory Average Pain 8 Pain Right Now 9 My pain is constant and aching  In the last 24 hours, has pain interfered with the following? General activity 9 Relation with others 9 Enjoyment of life 9 What TIME of day is your pain at its worst? night Sleep (in general) Poor  Pain is worse with: walking, sitting, standing and some activites Pain improves with: rest, medication and TENS Relief from Meds: 8  Mobility ability to climb steps?  yes do you drive?  yes  Function disabled: date disabled .  Neuro/Psych bladder control problems bowel control problems numbness spasms  Prior Studies Any changes since last visit?  no  Physicians involved in your care Any changes since last visit?  no   Family History  Problem Relation Age of Onset  . Alzheimer's disease Mother   . Diabetes Father    History   Social History  . Marital Status: Divorced    Spouse Name: [name]    Number of Children: [name]  . Years of Education: [name]   Social History Main Topics  . Smoking status: Never Smoker   . Smokeless tobacco: None  . Alcohol Use: No  . Drug Use: No  . Sexual Activity: None   Other Topics Concern  . None   Social History Narrative   Past Surgical History  Procedure Laterality Date  . Abdominal hysterectomy    . Spine surgery    . Cholecystectomy    . Appendectomy    . Tonsillectomy    . Breast surgery      rt breast cyst  . Rotator cuff repair      right   Past Medical History  Diagnosis Date  . Myalgia   . Calcific tendonitis   . Cervical  spondylosis without myelopathy   . Depression   . Pain in joint, upper arm   . Diabetes mellitus   . Hypertension   . Hiatal hernia    BP 118/64 mmHg  Pulse 105  Resp 14  SpO2 96%  Opioid Risk Score:   Fall Risk Score: Moderate Fall Risk (6-13 points) Review of Systems  Constitutional: Negative.   HENT: Negative.   Eyes: Negative.   Respiratory: Positive for shortness of breath.   Cardiovascular: Negative.   Gastrointestinal:       Bowel control problems  Endocrine: Negative.   Genitourinary:       Bladder control problems  Musculoskeletal: Positive for myalgias, back pain and neck pain.  Skin: Negative.   Allergic/Immunologic: Negative.   Neurological: Positive for numbness.       Spasms  Hematological: Negative.   Psychiatric/Behavioral: Negative.        Objective:   Physical Exam   Constitutional: She is oriented to person, place, and time. She appears well-developed and well-nourished. She has gained a little weight.  HENT:  Head: Normocephalic and atraumatic.  Eyes: Conjunctivae and EOM are normal. Pupils are equal, round, and reactive to light. Perhaps a little exopthalmos  Neck: Normal range of motion.  Cardiovascular: Normal rate, regular rhythm and normal heart sounds.  Pulmonary/Chest: Breath sounds normal. No respiratory distress. She has no wheezes.  Abdominal: Soft. Bowel sounds are normal. She exhibits no distension.  Musculoskeletal: left shoulder girdle and hamstrings are a little tight. Right levator scapulae with 2 trigger points and one on suprapinatus. Palpation reproduced pain as well as ROM of right shoulder.  Neurological: She is alert and oriented to person, place, and time. She has normal strength and normal reflexes. No sensory deficit.  Psychiatric: She has a normal mood and affect. Her behavior is normal. Judgment and thought content normal. Mood is pleasant as always   Assessment & Plan:   ASSESSMENT:  1. Fibromyalgia with myofascial  pain. --affecting right shoulder girdle today 2. Rotator cuff syndrome on the right.  3. History of repair was with chronic pain.  4. Cervicalgia. Post-laminectomy syndrome, facet arthropathy symptoms, associated myofascial pain and headaches. She is having increased pain to her head and shoulders which is in great part myofascially based at this time. Trap quite affected today..  5. Depression- which has improved  7. Mid/low back pain-DDD.  8. Decreased bone density  10. OA of right hand   PLAN:  1. After informed consent and preparation of the skin with isopropyl alcohol, I injected the right levator scapulae (2) and supraspinatus (1) with 2cc of 1% lidocaine. The patient tolerated well, and no complications were experienced. Post-injection instructions were provided.  2. Hydrocodone 5/325 one-half to one p.o. B.i.d prn. #60---a second rx was provided for next month.  3. Voltaren gel to hands prn.  4. Continue low dose ms contin.  5. Follow up with me in about 2 months. 15 minutes of face to face patient care time were spent during this visit. All questions were encouraged and answered.

## 2014-07-27 NOTE — Patient Instructions (Signed)
PLEASE CALL ME WITH ANY PROBLEMS OR QUESTIONS (#[phone]).      

## 2014-09-24 ENCOUNTER — Ambulatory Visit: Payer: Medicaid Other | Admitting: Physical Medicine & Rehabilitation

## 2014-09-26 ENCOUNTER — Other Ambulatory Visit: Payer: Self-pay | Admitting: Registered Nurse

## 2014-09-26 ENCOUNTER — Encounter: Payer: Medicaid Other | Attending: Physical Medicine and Rehabilitation | Admitting: Registered Nurse

## 2014-09-26 ENCOUNTER — Encounter: Payer: Self-pay | Admitting: Registered Nurse

## 2014-09-26 ENCOUNTER — Other Ambulatory Visit: Payer: Self-pay | Admitting: Physical Medicine & Rehabilitation

## 2014-09-26 VITALS — BP 126/46 | HR 90 | Resp 14

## 2014-09-26 DIAGNOSIS — M609 Myositis, unspecified: Secondary | ICD-10-CM | POA: Diagnosis present

## 2014-09-26 DIAGNOSIS — Z5181 Encounter for therapeutic drug level monitoring: Secondary | ICD-10-CM

## 2014-09-26 DIAGNOSIS — M961 Postlaminectomy syndrome, not elsewhere classified: Secondary | ICD-10-CM | POA: Diagnosis present

## 2014-09-26 DIAGNOSIS — M791 Myalgia: Secondary | ICD-10-CM | POA: Diagnosis present

## 2014-09-26 DIAGNOSIS — IMO0001 Reserved for inherently not codable concepts without codable children: Secondary | ICD-10-CM

## 2014-09-26 DIAGNOSIS — M797 Fibromyalgia: Secondary | ICD-10-CM

## 2014-09-26 DIAGNOSIS — Z79899 Other long term (current) drug therapy: Secondary | ICD-10-CM

## 2014-09-26 MED ORDER — MORPHINE SULFATE ER 15 MG PO TBCR: 15.0000 mg | EXTENDED_RELEASE_TABLET | Freq: Two times a day (BID) | ORAL | Status: DC

## 2014-09-26 MED ORDER — HYDROCODONE-ACETAMINOPHEN 5-325 MG PO TABS: 1.0000 | ORAL_TABLET | Freq: Four times a day (QID) | ORAL | Status: DC | PRN

## 2014-09-26 NOTE — Progress Notes (Signed)
Subjective:    Patient ID: Robin Morgan, female    DOB: [birth year], 59 y.o.   MRN: [id number]  HPI: Robin Morgan is a 59 year old female who reurns for follow up for chronic pain and medication refill. She says her pain is located in her neck,and mid-back. Staes "she was having occasional chest pain, denies chest pain at this time". Educated on F/U with her PCP she verbalizes understanding.She rates her pain 9. Her current exercise regime is walking and performing stretching exercises.  Pain Inventory Average Pain 8 Pain Right Now 9 My pain is constant  In the last 24 hours, has pain interfered with the following? General activity 7 Relation with others 7 Enjoyment of life 7 What TIME of day is your pain at its worst? morning, evening, night Sleep (in general) Poor  Pain is worse with: sitting Pain improves with: heat/ice and medication Relief from Meds: 7  Mobility walk with assistance ability to climb steps?  yes do you drive?  yes  Function disabled: date disabled .  Neuro/Psych bladder control problems numbness spasms  Prior Studies Any changes since last visit?  no  Physicians involved in your care Any changes since last visit?  no   Family History  Problem Relation Age of Onset  . Alzheimer's disease Mother   . Diabetes Father    History   Social History  . Marital Status: Divorced    Spouse Name: [name]  . Number of Children: [name]  . Years of Education: [name]   Social History Main Topics  . Smoking status: Never Smoker   . Smokeless tobacco: Not on file  . Alcohol Use: No  . Drug Use: No  . Sexual Activity: Not on file   Other Topics Concern  . None   Social History Narrative   Past Surgical History  Procedure Laterality Date  . Abdominal hysterectomy    . Spine surgery    . Cholecystectomy    . Appendectomy    . Tonsillectomy    . Breast surgery      rt breast cyst  . Rotator cuff repair      right   Past Medical History    Diagnosis Date  . Myalgia   . Calcific tendonitis   . Cervical spondylosis without myelopathy   . Depression   . Pain in joint, upper arm   . Diabetes mellitus   . Hypertension   . Hiatal hernia    BP 126/46 mmHg  Pulse 90  Resp 14  SpO2 97%  Opioid Risk Score:   Fall Risk Score: Moderate Fall Risk (6-13 points) (pt declin ed pamphlet during todays visit) Review of Systems  Respiratory: Positive for shortness of breath.   Gastrointestinal: Positive for diarrhea and constipation.  Endocrine:       High blood sugar Low blood sugar   Genitourinary: Positive for urgency.       Bladder control problems  Neurological: Positive for numbness.       Spasms  All other systems reviewed and are negative.      Objective:   Physical Exam  Constitutional: She is oriented to person, place, and time. She appears well-developed and well-nourished.  HENT:  Head: Normocephalic and atraumatic.  Neck: Normal range of motion. Neck supple.  Cervical Paraspinal Tenderness: C-3- C-5  Cardiovascular: Normal rate and regular rhythm.   Pulmonary/Chest: Effort normal and breath sounds normal.  Musculoskeletal:  Normal Muscle Bulk and Muscle Testing Reveals: Upper  Extremities: Full ROM and Muscle Strength 5/5 Right Spine of Scapula Tenderness Thoracic Hypersensitivity Lower Extremities: Full ROM and Muscle Strength 5/5 Arises from chair with ease Narrow Based Gait  Neurological: She is alert and oriented to person, place, and time.  Skin: Skin is warm and dry.  Psychiatric: She has a normal mood and affect.  Nursing note and vitals reviewed.         Assessment & Plan:  1. Fibromyalgia with myofascial pain: Continue exercise and heat therapy.  2. Rotator cuff syndrome on the right: Continue with stretching exercises and heat therapy.  3. Cervicalgia. Post-laminectomy syndrome, facet arthropathy: Continue Voltaren gel. Refilled: MS Contin 15 mg one tablet every 12 hours as needed  #60. Second script given. 4. Depression: Continue Cymbalta  5. Mid/low back pain: Continue Current Medication and stretching and heat therapy.  6 OA of right hand: No complaints voiced. Continue using Voltaren gel and heat therapy  7. Migraines: Continue Hydrocodone 5/325 mg one tablet every 6 hours as needed #60. Second script given  15 minutes of face to face patient care time was spent during this visit. All questions were encouraged and answered.   F/U in 1 month

## 2014-09-27 LAB — PMP ALCOHOL METABOLITE (ETG): Ethyl Glucuronide (EtG): NEGATIVE ng/mL

## 2014-10-02 LAB — ZOLPIDEM (LC/MS-MS), URINE
ZOLPIDEM (GC/LC/MS), UR CONFIRM: 42 ng/mL (ref <5)
ZOLPIDEM METABOLITE (GC/LS/MS) UR, CONFIRM: 412 ng/mL (ref <5)

## 2014-10-02 LAB — MDMA (ECSTASY), URINE
MDA GC/MS confirm: NEGATIVE ng/mL (ref <200)
MDMA GC/MS Conf: NEGATIVE ng/mL (ref <200)

## 2014-10-02 LAB — OPIATES/OPIOIDS (LC/MS-MS)
Codeine Urine: NEGATIVE ng/mL (ref <50)
HYDROMORPHONE: 236 ng/mL (ref <50)
Hydrocodone: 583 ng/mL (ref <50)
MORPHINE: 22658 ng/mL (ref <50)
Norhydrocodone, Ur: 621 ng/mL (ref <50)
Noroxycodone, Ur: NEGATIVE ng/mL (ref <50)
OXYCODONE, UR: NEGATIVE ng/mL (ref <50)
Oxymorphone: NEGATIVE ng/mL (ref <50)

## 2014-10-03 LAB — PRESCRIPTION MONITORING PROFILE (SOLSTAS)
Amphetamine/Meth: NEGATIVE ng/mL
BARBITURATE SCREEN, URINE: NEGATIVE ng/mL
Benzodiazepine Screen, Urine: NEGATIVE ng/mL
Buprenorphine, Urine: NEGATIVE ng/mL
CANNABINOID SCRN UR: NEGATIVE ng/mL
CARISOPRODOL, URINE: NEGATIVE ng/mL
CREATININE, URINE: 113.87 mg/dL (ref >20.0)
Cocaine Metabolites: NEGATIVE ng/mL
Fentanyl, Ur: NEGATIVE ng/mL
Meperidine, Ur: NEGATIVE ng/mL
Methadone Screen, Urine: NEGATIVE ng/mL
Nitrites, Initial: NEGATIVE ug/mL
OXYCODONE SCRN UR: NEGATIVE ng/mL
PROPOXYPHENE: NEGATIVE ng/mL
TAPENTADOLUR: NEGATIVE ng/mL
TRAMADOL UR: NEGATIVE ng/mL
pH, Initial: 5.1 pH (ref 4.5–8.9)

## 2014-10-10 ENCOUNTER — Telehealth (HOSPITAL_COMMUNITY): Payer: Self-pay | Admitting: Cardiology

## 2014-10-10 NOTE — Telephone Encounter (Signed)
Received records from [facility] for appointment with Dr Martinique on 10/11/14.  Records given to [facility] (medical records) for Dr Doug Sou schedule on 10/11/14.

## 2014-10-10 NOTE — Telephone Encounter (Signed)
Received records from [place] for appointment with Dr Martinique on 10/11/14.  Records given to [facility] (medical records) for Dr Doug Sou schedule on 10/11/14. lp

## 2014-10-11 ENCOUNTER — Ambulatory Visit (INDEPENDENT_AMBULATORY_CARE_PROVIDER_SITE_OTHER): Payer: Medicaid Other | Admitting: Cardiology

## 2014-10-11 ENCOUNTER — Encounter: Payer: Self-pay | Admitting: Cardiology

## 2014-10-11 VITALS — BP 130/60 | HR 92 | Ht 62.0 in | Wt 145.0 lb

## 2014-10-11 DIAGNOSIS — R0789 Other chest pain: Secondary | ICD-10-CM

## 2014-10-11 DIAGNOSIS — E1169 Type 2 diabetes mellitus with other specified complication: Secondary | ICD-10-CM | POA: Insufficient documentation

## 2014-10-11 DIAGNOSIS — I10 Essential (primary) hypertension: Secondary | ICD-10-CM

## 2014-10-11 DIAGNOSIS — E1140 Type 2 diabetes mellitus with diabetic neuropathy, unspecified: Secondary | ICD-10-CM | POA: Insufficient documentation

## 2014-10-11 DIAGNOSIS — E119 Type 2 diabetes mellitus without complications: Secondary | ICD-10-CM

## 2014-10-11 DIAGNOSIS — E785 Hyperlipidemia, unspecified: Secondary | ICD-10-CM

## 2014-10-11 DIAGNOSIS — I152 Hypertension secondary to endocrine disorders: Secondary | ICD-10-CM | POA: Insufficient documentation

## 2014-10-11 DIAGNOSIS — Z794 Long term (current) use of insulin: Secondary | ICD-10-CM

## 2014-10-11 DIAGNOSIS — E1159 Type 2 diabetes mellitus with other circulatory complications: Secondary | ICD-10-CM | POA: Insufficient documentation

## 2014-10-11 DIAGNOSIS — R0600 Dyspnea, unspecified: Secondary | ICD-10-CM

## 2014-10-11 NOTE — Patient Instructions (Signed)
We will schedule you for a nuclear stress test   

## 2014-10-11 NOTE — Progress Notes (Signed)
Robin Morgan Date of Birth: 06-09-1956 Medical Record #[id number]  History of Present Illness: Robin Morgan is seen at the request of Robin Morgan for evaluation of dyspnea and chest pain. She is a pleasant 59 yo WF with history of DM type 2, HTN, and dyslipidemia. She reports a 2 weeks history of significant dyspnea. It is worse with exertion. No cough or edema. No orthopnea. She has some chest discomfort like an electric shock in her left chest associated with tingling in her left hand. She also gets sweaty. She was seen by Robin Morgan. CT of the chest was negative for PE or significant lung or aortic pathology. She does report seeing Robin Morgan years ago for ? MV prolapse but he told her it had resolved. She is quite limited by fibromyalgia.     Medication List       This list is accurate as of: 10/11/14  4:03 PM.  Always use your most recent med list.               b complex vitamins tablet  Take 1 tablet by mouth daily.     calcium-vitamin D 500-200 MG-UNIT per tablet  Take 1 tablet by mouth 2 (two) times daily with a meal.     co-enzyme Q-10 30 MG capsule  Take 30 mg by mouth 3 (three) times daily.     cyclobenzaprine 10 MG tablet  Commonly known as:  FLEXERIL  Take 1 tablet (10 mg total) by mouth every 8 (eight) hours as needed.     diclofenac sodium 1 % Gel  Commonly known as:  VOLTAREN  Apply 1 application topically 3 (three) times daily.     DULoxetine 60 MG capsule  Commonly known as:  CYMBALTA  Take 60 mg by mouth daily.     fenofibrate 160 MG tablet  Take 160 mg by mouth daily.     gabapentin 300 MG capsule  Commonly known as:  NEURONTIN  TAKE 1 CAPSULE BY MOUTH 3 TIMES DAILY.     glipiZIDE 5 MG 24 hr tablet  Commonly known as:  GLUCOTROL XL  Take 5 mg by mouth every morning.     HYDROcodone-acetaminophen 5-325 MG per tablet  Commonly known as:  NORCO/VICODIN  Take 1 tablet by mouth every 6 (six) hours as needed.     insulin glargine 100 UNIT/ML injection    Commonly known as:  LANTUS  Inject 52 Units into the skin at bedtime.     levothyroxine 50 MCG tablet  Commonly known as:  SYNTHROID, LEVOTHROID  Take 50 mcg by mouth daily.     lisinopril 10 MG tablet  Commonly known as:  PRINIVIL,ZESTRIL  Take 10 mg by mouth daily.     magnesium gluconate 500 MG tablet  Commonly known as:  MAGONATE  Take 500 mg by mouth daily.     metFORMIN 500 MG tablet  Commonly known as:  GLUCOPHAGE  Take 500 mg by mouth 2 (two) times daily with a meal.     morphine 15 MG 12 hr tablet  Commonly known as:  MS CONTIN  Take 1 tablet (15 mg total) by mouth every 12 (twelve) hours.     Vitamin D (Ergocalciferol) 50000 UNITS Caps capsule  Commonly known as:  DRISDOL  Take 1 capsule (50,000 Units total) by mouth every 7 (seven) days.     zolpidem 10 MG tablet  Commonly known as:  AMBIEN  Take 1 tablet (10 mg total) by mouth  at bedtime as needed.         Allergies  Allergen Reactions  . Compazine Other (See Comments)    Seizure-like reaction  . Naproxen Itching    Past Medical History  Diagnosis Date  . Myalgia   . Calcific tendonitis   . Cervical spondylosis without myelopathy   . Depression   . Pain in joint, upper arm   . Diabetes mellitus   . Hypertension   . Hiatal hernia   . Fibromyalgia   . Hyperlipidemia   . GERD (gastroesophageal reflux disease)   . Esophageal stricture     Past Surgical History  Procedure Laterality Date  . Abdominal hysterectomy    . Spine surgery    . Cholecystectomy    . Appendectomy    . Tonsillectomy    . Breast surgery      rt breast cyst  . Rotator cuff repair      right    History   Social History  . Marital Status: Divorced    Spouse Name: [name]  . Number of Children: 1  . Years of Education: [name]   Social History Main Topics  . Smoking status: Never Smoker   . Smokeless tobacco: Not on file  . Alcohol Use: No  . Drug Use: No  . Sexual Activity: Not on file   Other Topics Concern  .  None   Social History Narrative    Family History  Problem Relation Age of Onset  . Alzheimer's disease Mother   . Diabetes Father     Review of Systems: The review of systems is positive for chronic back pain and leg weakness.  She complains of pain in her lower abdomen and pelvis bilaterally.  All other systems were reviewed and are negative.  Physical Exam: BP 130/60 mmHg  Pulse 92  Ht 5\' 2"  (1.575 m)  Wt 145 lb (65.772 kg)  BMI 26.51 kg/m2 Filed Weights   10/11/14 1516  Weight: 145 lb (65.772 kg)  GENERAL:  Well appearing, overweight WF in NAD HEENT:  PERRL, EOMI, sclera are clear. Oropharynx is clear. NECK:  No jugular venous distention, carotid upstroke brisk and symmetric, no bruits, no thyromegaly or adenopathy LUNGS:  Clear to auscultation bilaterally CHEST:  Unremarkable HEART:  RRR,  PMI not displaced or sustained,S1 and S2 within normal limits, no S3, no S4: no clicks, no rubs, no murmurs ABD:  Soft, nontender. BS +, no masses or bruits. No hepatomegaly, no splenomegaly EXT:  2 + pulses throughout, no edema, no cyanosis no clubbing SKIN:  Warm and dry.  No rashes NEURO:  Alert and oriented x 3. Cranial nerves II through XII intact. PSYCH:  Cognitively intact    LABORATORY DATA: Labs reviewed in [place] for recent visit with Robin Morgan. CBC, chemistries all normal. A1c 6.2%. Ecg shows NSR with nonspecific findings of low voltage and nonspecific TWA. CXR and Abd. Xray normal.   Assessment / Plan: 1. Dyspnea with atypical chest pain. CT negative for PE. Patient does have multiple cardiac risk factors and this could be an atypical presentation for CAD. I have recommended a Lexiscan myoview to evaluate further. We will also be able to assess LV function. She in not able to walk on a treadmill due to fibromyalgia. Her cardiac exam is normal without murmur. I doubt significant valve pathology. She has had Echo in the distant past. If Myoview is normal we may need to  repeat for completeness sake.   2. DM type  2  3. HTN  4. Dyslipidemia.   5. Fibromyalgia

## 2014-10-16 ENCOUNTER — Encounter: Payer: Self-pay | Admitting: Cardiology

## 2014-10-17 ENCOUNTER — Telehealth (HOSPITAL_COMMUNITY): Payer: Self-pay

## 2014-10-17 NOTE — Telephone Encounter (Signed)
Encounter complete. 

## 2014-10-18 ENCOUNTER — Telehealth (HOSPITAL_COMMUNITY): Payer: Self-pay

## 2014-10-18 NOTE — Telephone Encounter (Signed)
Encounter complete. 

## 2014-10-19 ENCOUNTER — Encounter (HOSPITAL_COMMUNITY): Payer: Medicaid Other

## 2014-10-19 NOTE — Progress Notes (Signed)
Urine drug screen for this encounter is consistent for prescribed medication 

## 2014-10-23 ENCOUNTER — Telehealth (HOSPITAL_COMMUNITY): Payer: Self-pay

## 2014-10-23 NOTE — Telephone Encounter (Signed)
Encounter complete. 

## 2014-10-24 ENCOUNTER — Telehealth (HOSPITAL_COMMUNITY): Payer: Self-pay

## 2014-10-24 NOTE — Telephone Encounter (Signed)
Encounter complete. 

## 2014-10-25 ENCOUNTER — Ambulatory Visit (HOSPITAL_COMMUNITY)
Admission: RE | Admit: 2014-10-25 | Discharge: 2014-10-25 | Disposition: A | Discharge status: Home / Self Care | Payer: Medicaid Other | Source: Ambulatory Visit | Attending: Cardiology | Admitting: Cardiology

## 2014-10-25 DIAGNOSIS — R0789 Other chest pain: Secondary | ICD-10-CM | POA: Diagnosis not present

## 2014-10-25 DIAGNOSIS — R0600 Dyspnea, unspecified: Secondary | ICD-10-CM | POA: Insufficient documentation

## 2014-10-25 DIAGNOSIS — E119 Type 2 diabetes mellitus without complications: Secondary | ICD-10-CM | POA: Diagnosis not present

## 2014-10-25 DIAGNOSIS — I10 Essential (primary) hypertension: Secondary | ICD-10-CM

## 2014-10-25 DIAGNOSIS — E785 Hyperlipidemia, unspecified: Secondary | ICD-10-CM

## 2014-10-25 MED ORDER — REGADENOSON 0.4 MG/5ML IV SOLN
0.4000 mg | Freq: Once | INTRAVENOUS | Status: AC
  Administered 2014-10-25: 0.4 mg via INTRAVENOUS

## 2014-10-25 MED ORDER — TECHNETIUM TC 99M SESTAMIBI GENERIC - CARDIOLITE
10.7000 | Freq: Once | INTRAVENOUS | Status: AC | PRN
  Administered 2014-10-25: 11 via INTRAVENOUS

## 2014-10-25 MED ORDER — TECHNETIUM TC 99M SESTAMIBI GENERIC - CARDIOLITE
30.9000 | Freq: Once | INTRAVENOUS | Status: AC | PRN
  Administered 2014-10-25: 30.9 via INTRAVENOUS

## 2014-10-25 NOTE — Procedures (Addendum)
[facility] [address] [place] [address] [phone]  Cardiology Nuclear Med Study  Robin Morgan is a 59 y.o. female     MRN : [id number]     DOB: 09-14-1955  Procedure Date: 10/25/2014  Nuclear Med Background Indication for Stress Test:  Evaluation for Ischemia History:  MVP and No further cardiac history reported;No prior respiratory history reported;No prior NUC MPI for comparison in EPIC. Cardiac Risk Factors: Hypertension, IDDM Type 2, Lipids and Obesity  Symptoms:  Chest Pain, Dizziness, DOE, Fatigue, Light-Headedness and SOB   Nuclear Pre-Procedure Caffeine/Decaff Intake:  1:30am NPO After: 9:30am   IV Site: R Forearm  IV 0.9% NS with Angio Cath:  22g  Chest Size (in):  n/a IV Started by: Rolene Course, RN  Height: 5\' 2"  (1.575 m)  Cup Size: D  BMI:  Body mass index is 26.88 kg/(m^2). Weight:  147 lb (66.679 kg)   Tech Comments:  n/a    Nuclear Med Study 1 or 2 day study: 1 day  Stress Test Type:  [place] Provider:  Peter Martinique, MD   Resting Radionuclide: Technetium 57m Sestamibi  Resting Radionuclide Dose: 10.7 mCi   Stress Radionuclide:  Technetium 71m Sestamibi  Stress Radionuclide Dose: 30.9 mCi           Stress Protocol Rest HR: 87 Stress HR: 103  Rest BP: 139/71 Stress BP: 138/50  Exercise Time (min): n/a METS: n/a   Predicted Max HR: 159 bpm % Max HR: 67.92 bpm Rate Pressure Product: 36122  Dose of Adenosine (mg):  n/a Dose of Lexiscan: 0.4 mg  Dose of Atropine (mg): n/a Dose of Dobutamine: n/a mcg/kg/min (at max HR)  Stress Test Technologist: Leane Para, CCT Nuclear Technologist: Anthony Sar   Rest Procedure:  Myocardial perfusion imaging was performed at rest 45 minutes following the intravenous administration of Technetium 72m Sestamibi. Stress Procedure:  The patient received IV Lexiscan 0.4 mg over 15-seconds.  Technetium 35m Sestamibi injected  at IV 30-seconds.  There were no significant changes with Lexiscan.  Quantitative spect images were obtained after a 45 minute delay.  Transient Ischemic Dilatation (Normal <1.22):  0.94  QGS EDV:  56 ml QGS ESV:  18 ml LV Ejection Fraction: 67%  PHYSICIAN INTERPRETATION  Rest ECG: NSR with non-specific ST-T wave changes  Stress ECG: No significant change from baseline ECG and No significant ST segment change suggestive of ischemia.  QPS Raw Data Images:  There is significant tracer uptake in the subdiaphragmatic splanchnic viscerae that partiall obscures the inferior border & distorted the computer generated estimation of defects. Stress Images:  Normal homogeneous uptake in all areas of the myocardium. Rest Images:  Normal homogeneous uptake in all areas of the myocardium. Subtraction (SDS):  There is no evidence of scar or ischemia.  Impression Exercise Capacity:  Lexiscan with no exercise. BP Response:  Normal blood pressure response. Clinical Symptoms:  There is dyspnea. ECG Impression:  No significant ECG changes with Lexiscan. Comparison with Prior Nuclear Study: No images to compare  Overall Impression:  Normal stress nuclear study. and Low risk stress nuclear study with no evidence of ischemia or infarction..  LV Wall Motion:  NL LV Function; NL Wall Motion   Leonie Man, MD  10/25/2014 6:31 PM

## 2014-10-29 ENCOUNTER — Other Ambulatory Visit: Payer: Self-pay

## 2014-10-29 DIAGNOSIS — R0600 Dyspnea, unspecified: Secondary | ICD-10-CM

## 2014-11-02 ENCOUNTER — Ambulatory Visit (HOSPITAL_COMMUNITY)
Admission: RE | Admit: 2014-11-02 | Discharge: 2014-11-02 | Disposition: A | Discharge status: Home / Self Care | Payer: Medicaid Other | Source: Ambulatory Visit | Attending: Cardiology | Admitting: Cardiology

## 2014-11-02 DIAGNOSIS — R0600 Dyspnea, unspecified: Secondary | ICD-10-CM | POA: Diagnosis not present

## 2014-11-02 NOTE — Progress Notes (Signed)
2D Echocardiogram Complete.  11/02/2014   Shanyce Daris, RDCS  

## 2014-11-21 ENCOUNTER — Encounter: Payer: Medicaid Other | Attending: Physical Medicine and Rehabilitation | Admitting: Physical Medicine & Rehabilitation

## 2014-11-21 ENCOUNTER — Encounter: Payer: Self-pay | Admitting: Physical Medicine & Rehabilitation

## 2014-11-21 ENCOUNTER — Other Ambulatory Visit: Payer: Self-pay | Admitting: Physical Medicine & Rehabilitation

## 2014-11-21 VITALS — BP 109/63 | HR 84 | Resp 14

## 2014-11-21 DIAGNOSIS — M609 Myositis, unspecified: Secondary | ICD-10-CM | POA: Insufficient documentation

## 2014-11-21 DIAGNOSIS — IMO0001 Reserved for inherently not codable concepts without codable children: Secondary | ICD-10-CM

## 2014-11-21 DIAGNOSIS — M961 Postlaminectomy syndrome, not elsewhere classified: Secondary | ICD-10-CM | POA: Diagnosis not present

## 2014-11-21 DIAGNOSIS — Z79899 Other long term (current) drug therapy: Secondary | ICD-10-CM | POA: Diagnosis not present

## 2014-11-21 DIAGNOSIS — Z5181 Encounter for therapeutic drug level monitoring: Secondary | ICD-10-CM | POA: Diagnosis not present

## 2014-11-21 DIAGNOSIS — M791 Myalgia: Secondary | ICD-10-CM | POA: Diagnosis present

## 2014-11-21 DIAGNOSIS — M67919 Unspecified disorder of synovium and tendon, unspecified shoulder: Secondary | ICD-10-CM

## 2014-11-21 DIAGNOSIS — M6530 Trigger finger, unspecified finger: Secondary | ICD-10-CM

## 2014-11-21 MED ORDER — HYDROCODONE-ACETAMINOPHEN 5-325 MG PO TABS: 1.0000 | ORAL_TABLET | Freq: Four times a day (QID) | ORAL | Status: DC | PRN

## 2014-11-21 MED ORDER — MORPHINE SULFATE ER 15 MG PO TBCR: 15.0000 mg | EXTENDED_RELEASE_TABLET | Freq: Two times a day (BID) | ORAL | Status: DC

## 2014-11-21 MED ORDER — MELOXICAM 7.5 MG PO TABS: 7.5000 mg | ORAL_TABLET | Freq: Every day | ORAL | Status: DC

## 2014-11-21 NOTE — Patient Instructions (Signed)
Trigger Finger Trigger finger (digital tendinitis and stenosing tenosynovitis) is a common disorder that causes an often painful catching of the fingers or thumb. It occurs as a clicking, snapping, or locking of a finger in the palm of the hand. This is caused by a problem with the tendons that flex or bend the fingers sliding smoothly through their sheaths. The condition may occur in any finger or a couple fingers at the same time.  The finger may lock with the finger curled or suddenly straighten out with a snap. This is more common in patients with rheumatoid arthritis and diabetes. Left untreated, the condition may get worse to the point where the finger becomes locked in flexion, like making a fist, or less commonly locked with the finger straightened out. CAUSES   Inflammation and scarring that lead to swelling around the tendon sheath.  Repeated or forceful movements.  Rheumatoid arthritis, an autoimmune disease that affects joints.  Gout.  Diabetes mellitus. SIGNS AND SYMPTOMS  Soreness and swelling of your finger.  A painful clicking or snapping as you bend and straighten your finger. DIAGNOSIS  Your health care provider will do a physical exam of your finger to diagnose trigger finger. TREATMENT   Splinting for 6-8 weeks may be helpful.  Nonsteroidal anti-inflammatory medicines (NSAIDs) can help to relieve the pain and inflammation.  Cortisone injections, along with splinting, may speed up recovery. Several injections may be required. Cortisone may give relief after one injection.  Surgery is another treatment that may be used if conservative treatments do not work. Surgery can be minor, without incisions (a cut does not have to be made), and can be done with a needle through the skin.  Other surgical choices involve an open procedure in which the surgeon opens the hand through a small incision and cuts the pulley so the tendon can again slide smoothly. Your hand will still  work fine. HOME CARE INSTRUCTIONS  Apply ice to the injured area, twice per day:  Put ice in a plastic bag.  Place a towel between your skin and the bag.  Leave the ice on for 20 minutes, 3-4 times a day.  Rest your hand often. MAKE SURE YOU:   Understand these instructions.  Will watch your condition.  Will get help right away if you are not doing well or get worse. Document Released: 05/16/2004 Document Revised: 03/29/2013 Document Reviewed: 12/27/2012 Exit[employer] Patient Information 2015 [employer]. This information is not intended to replace advice given to you by your health care provider. Make sure you discuss any questions you have with your health care provider.  

## 2014-11-21 NOTE — Progress Notes (Signed)
Subjective:    Patient ID: Robin Morgan, female    DOB: 1956-02-05, 59 y.o.   MRN: [id number]  HPI   Robin Morgan is back regarding her chronic pain. She has had headaches which have generated from her neck. She had good results with the TPI's initially.   Her left middle finger continues to bother her. She told me today that the finger sometimes "sticks." the voltaren gel helps.   She continues on her medication as prescribed.   Mood has been fair to good.    Pain Inventory Average Pain 8 Pain Right Now 7 My pain is constant, sharp and tingling  In the last 24 hours, has pain interfered with the following? General activity 7 Relation with others 7 Enjoyment of life 7 What TIME of day is your pain at its worst? evening, night Sleep (in general) Poor  Pain is worse with: standing Pain improves with: heat/ice and medication Relief from Meds: 9  Mobility walk without assistance ability to climb steps?  yes do you drive?  yes  Function disabled: date disabled .  Neuro/Psych bladder control problems bowel control problems spasms  Prior Studies Any changes since last visit?  no  Physicians involved in your care Any changes since last visit?  no   Family History  Problem Relation Age of Onset  . Alzheimer's disease Mother   . Diabetes Father    History   Social History  . Marital Status: Divorced    Spouse Name: [name]  . Number of Children: 1  . Years of Education: [name]   Social History Main Topics  . Smoking status: Never Smoker   . Smokeless tobacco: Not on file  . Alcohol Use: No  . Drug Use: No  . Sexual Activity: Not on file   Other Topics Concern  . None   Social History Narrative   Past Surgical History  Procedure Laterality Date  . Abdominal hysterectomy    . Spine surgery    . Cholecystectomy    . Appendectomy    . Tonsillectomy    . Breast surgery      rt breast cyst  . Rotator cuff repair      right   Past Medical History    Diagnosis Date  . Myalgia   . Calcific tendonitis   . Cervical spondylosis without myelopathy   . Depression   . Pain in joint, upper arm   . Diabetes mellitus   . Hypertension   . Hiatal hernia   . Fibromyalgia   . Hyperlipidemia   . GERD (gastroesophageal reflux disease)   . Esophageal stricture    BP 109/63 mmHg  Pulse 84  Resp 14  SpO2 95%  Opioid Risk Score:   Fall Risk Score: Moderate Fall Risk (6-13 points)`1  Depression screen PHQ 2/9  Depression screen PHQ 2/9 11/21/2014  Decreased Interest 2  Down, Depressed, Hopeless 0  PHQ - 2 Score 2  Altered sleeping 2  Tired, decreased energy 2  Change in appetite 1  Feeling bad or failure about yourself  0  Trouble concentrating 1  Moving slowly or fidgety/restless 0  Suicidal thoughts 0  PHQ-9 Score 8     Review of Systems  Respiratory: Positive for shortness of breath.   Gastrointestinal: Positive for diarrhea and constipation.  Genitourinary: Positive for frequency.  Neurological: Positive for weakness.       Spasms  All other systems reviewed and are negative.      Objective:  Physical Exam   Constitutional: She is oriented to person, place, and time. She appears well-developed and well-nourished. She has gained a little weight.  HENT:  Head: Normocephalic and atraumatic.  Eyes: Conjunctivae and EOM are normal. Pupils are equal, round, and reactive to light. Perhaps a little exopthalmos  Neck: Normal range of motion.  Cardiovascular: Normal rate, regular rhythm and normal heart sounds.  Pulmonary/Chest: Breath sounds normal. No respiratory distress. She has no wheezes.  Abdominal: Soft. Bowel sounds are normal. She exhibits no distension.  Musculoskeletal: left shoulder girdle and hamstrings are a little tight. Shoulder girdle remains tight and generally tender. Left middle finger tender at MCP and worse with flexion and extension.  Neurological: She is alert and oriented to person, place, and time.  She has normal strength and normal reflexes. No sensory deficit.  Psychiatric: She has a normal mood and affect. Her behavior is normal. Judgment and thought content normal. Mood is pleasant as always    Assessment & Plan:   ASSESSMENT:  1. Fibromyalgia with myofascial pain. --affecting right shoulder girdle today  2. Rotator cuff syndrome on the right.  3. History of repair was with chronic pain.  4. Cervicalgia. Post-laminectomy syndrome, facet arthropathy symptoms, associated myofascial pain and headaches. She is having increased pain to her head and shoulders which is in great part myofascially based at this time. Trap quite affected today..  5. Depression- which has improved  7. Mid/low back pain-DDD.  8. Decreased bone density  10. Trigger finger left middle     PLAN:  1. Will order a cervical traction unit which should help with myofascial symptoms and headaches. Consider further TPI's 2. Hydrocodone 5/325 one-half to one p.o. B.i.d prn. #60---a second rx was provided for next month.  3. Voltaren gel to hands prn. Add mobic q-day. Consider injection 4. Continue low dose ms contin.  5. Follow up with me in about 1-2 months. 15 minutes of face to face patient care time were spent during this visit. All questions were encouraged and answered.

## 2014-11-22 ENCOUNTER — Telehealth: Payer: Self-pay | Admitting: Physical Medicine & Rehabilitation

## 2014-11-22 DIAGNOSIS — IMO0001 Reserved for inherently not codable concepts without codable children: Secondary | ICD-10-CM

## 2014-11-22 DIAGNOSIS — M961 Postlaminectomy syndrome, not elsewhere classified: Secondary | ICD-10-CM

## 2014-11-22 LAB — PMP ALCOHOL METABOLITE (ETG): Ethyl Glucuronide (EtG): NEGATIVE ng/mL

## 2014-11-22 NOTE — Telephone Encounter (Signed)
Will order a cervical traction unit which should help with myofascial symptoms and headaches---this was in chart note, but did not see an order for it.  Can an order be done so this can get ordered?  Thanks.

## 2014-11-22 NOTE — Telephone Encounter (Signed)
Is this the order you wanted placed?  I have a form for a [facility] Cervical Traction through [facility] and [facility] has traction on a form.

## 2014-11-26 NOTE — Telephone Encounter (Signed)
Spoke with patient. Have made a referral to [facility] for PT/collar eval

## 2014-11-27 LAB — OPIATES/OPIOIDS (LC/MS-MS)
Codeine Urine: NEGATIVE ng/mL (ref <50)
HYDROCODONE: 750 ng/mL (ref <50)
Hydromorphone: 198 ng/mL (ref <50)
Morphine Urine: 20124 ng/mL (ref <50)
NOROXYCODONE, UR: NEGATIVE ng/mL (ref <50)
Norhydrocodone, Ur: 556 ng/mL (ref <50)
OXYMORPHONE, URINE: NEGATIVE ng/mL (ref <50)
Oxycodone, ur: NEGATIVE ng/mL (ref <50)

## 2014-11-27 LAB — ZOLPIDEM (LC/MS-MS), URINE
Zolpidem (GC/LC/MS), Ur confirm: 39 ng/mL (ref <5)
Zolpidem metabolite (GC/LC/MS) Ur, confirm: 285 ng/mL (ref <5)

## 2014-11-27 LAB — MDMA (ECSTASY), URINE
MDMA GC/MS CONF: NEGATIVE ng/mL (ref <200)
METHYLENEDIOXYAMPHETAMINE: NEGATIVE ng/mL (ref <200)

## 2014-11-28 LAB — PRESCRIPTION MONITORING PROFILE (SOLSTAS)
AMPHETAMINE/METH: NEGATIVE ng/mL
BARBITURATE SCREEN, URINE: NEGATIVE ng/mL
BUPRENORPHINE, URINE: NEGATIVE ng/mL
Benzodiazepine Screen, Urine: NEGATIVE ng/mL
COCAINE METABOLITES: NEGATIVE ng/mL
Cannabinoid Scrn, Ur: NEGATIVE ng/mL
Carisoprodol, Urine: NEGATIVE ng/mL
Creatinine, Urine: 110.58 mg/dL (ref >20.0)
Fentanyl, Ur: NEGATIVE ng/mL
MEPERIDINE UR: NEGATIVE ng/mL
Methadone Screen, Urine: NEGATIVE ng/mL
Nitrites, Initial: NEGATIVE ug/mL
Oxycodone Screen, Ur: NEGATIVE ng/mL
Propoxyphene: NEGATIVE ng/mL
TAPENTADOLUR: NEGATIVE ng/mL
Tramadol Scrn, Ur: NEGATIVE ng/mL
pH, Initial: 5.1 pH (ref 4.5–8.9)

## 2014-12-07 NOTE — Progress Notes (Signed)
Urine drug screen for this encounter is consistent for prescribed medication 

## 2014-12-19 ENCOUNTER — Encounter: Payer: Self-pay | Admitting: Registered Nurse

## 2014-12-19 ENCOUNTER — Encounter: Payer: Medicaid Other | Attending: Physical Medicine and Rehabilitation | Admitting: Registered Nurse

## 2014-12-19 VITALS — BP 129/56 | HR 89 | Resp 14

## 2014-12-19 DIAGNOSIS — Z5181 Encounter for therapeutic drug level monitoring: Secondary | ICD-10-CM

## 2014-12-19 DIAGNOSIS — R51 Headache: Secondary | ICD-10-CM | POA: Diagnosis not present

## 2014-12-19 DIAGNOSIS — M67919 Unspecified disorder of synovium and tendon, unspecified shoulder: Secondary | ICD-10-CM

## 2014-12-19 DIAGNOSIS — M961 Postlaminectomy syndrome, not elsewhere classified: Secondary | ICD-10-CM | POA: Diagnosis not present

## 2014-12-19 DIAGNOSIS — M797 Fibromyalgia: Secondary | ICD-10-CM

## 2014-12-19 DIAGNOSIS — M609 Myositis, unspecified: Secondary | ICD-10-CM | POA: Diagnosis present

## 2014-12-19 DIAGNOSIS — IMO0001 Reserved for inherently not codable concepts without codable children: Secondary | ICD-10-CM

## 2014-12-19 DIAGNOSIS — M791 Myalgia: Secondary | ICD-10-CM

## 2014-12-19 DIAGNOSIS — M6530 Trigger finger, unspecified finger: Secondary | ICD-10-CM

## 2014-12-19 DIAGNOSIS — R519 Headache, unspecified: Secondary | ICD-10-CM

## 2014-12-19 DIAGNOSIS — G8929 Other chronic pain: Secondary | ICD-10-CM

## 2014-12-19 DIAGNOSIS — Z79899 Other long term (current) drug therapy: Secondary | ICD-10-CM

## 2014-12-19 MED ORDER — MORPHINE SULFATE ER 15 MG PO TBCR: 15.0000 mg | EXTENDED_RELEASE_TABLET | Freq: Two times a day (BID) | ORAL | Status: DC

## 2014-12-19 MED ORDER — HYDROCODONE-ACETAMINOPHEN 5-325 MG PO TABS: 1.0000 | ORAL_TABLET | Freq: Four times a day (QID) | ORAL | Status: DC | PRN

## 2014-12-19 NOTE — Progress Notes (Signed)
Subjective:    Patient ID: EMRI SAMPLE, female    DOB: 1955-11-18, 59 y.o.   MRN: [id number]  HPI: Robin Morgan is a 59 year old female who reurns for follow up for chronic pain and medication refill. She says her pain is located in her bilateral shoulders,mid-back, fibro pain and fibro headache.She rates her pain 7. Her current exercise regime is walking and performing stretching exercises. She will be going to [facility] for Physical Therapy Collar Evaluation on 12/26/14.  Pain Inventory Average Pain 8 Pain Right Now 7 My pain is constant, sharp and tingling  In the last 24 hours, has pain interfered with the following? General activity 7 Relation with others 7 Enjoyment of life 7 What TIME of day is your pain at its worst? evening, night Sleep (in general) Poor  Pain is worse with: standing Pain improves with: heat/ice and medication Relief from Meds: 9  Mobility walk without assistance ability to climb steps?  yes do you drive?  yes  Function disabled: date disabled .  Neuro/Psych bladder control problems bowel control problems spasms  Prior Studies Any changes since last visit?  no  Physicians involved in your care Any changes since last visit?  no   Family History  Problem Relation Age of Onset  . Alzheimer's disease Mother   . Diabetes Father    History   Social History  . Marital Status: Divorced    Spouse Name: [name]  . Number of Children: 1  . Years of Education: [name]   Social History Main Topics  . Smoking status: Never Smoker   . Smokeless tobacco: Not on file  . Alcohol Use: No  . Drug Use: No  . Sexual Activity: Not on file   Other Topics Concern  . None   Social History Narrative   Past Surgical History  Procedure Laterality Date  . Abdominal hysterectomy    . Spine surgery    . Cholecystectomy    . Appendectomy    . Tonsillectomy    . Breast surgery      rt breast cyst  . Rotator cuff repair      right    Past Medical History  Diagnosis Date  . Myalgia   . Calcific tendonitis   . Cervical spondylosis without myelopathy   . Depression   . Pain in joint, upper arm   . Diabetes mellitus   . Hypertension   . Hiatal hernia   . Fibromyalgia   . Hyperlipidemia   . GERD (gastroesophageal reflux disease)   . Esophageal stricture    BP 129/56 mmHg  Pulse 89  Resp 14  SpO2 93%  Opioid Risk Score:   Fall Risk Score: Moderate Fall Risk (6-13 points) (patient declined information during todays visit)`1  Depression screen PHQ 2/9  Depression screen PHQ 2/9 11/21/2014  Decreased Interest 2  Down, Depressed, Hopeless 0  PHQ - 2 Score 2  Altered sleeping 2  Tired, decreased energy 2  Change in appetite 1  Feeling bad or failure about yourself  0  Trouble concentrating 1  Moving slowly or fidgety/restless 0  Suicidal thoughts 0  PHQ-9 Score 8     Review of Systems  Respiratory: Positive for shortness of breath.   Gastrointestinal: Positive for diarrhea and constipation.  Genitourinary: Positive for frequency.  Neurological: Positive for weakness.       Spasms   All other systems reviewed and are negative.      Objective:  Physical Exam  Constitutional: She is oriented to person, place, and time. She appears well-developed and well-nourished.  HENT:  Head: Normocephalic and atraumatic.  Neck: Normal range of motion. Neck supple.  Cardiovascular: Normal rate and regular rhythm.   Pulmonary/Chest: Effort normal and breath sounds normal.  Musculoskeletal:  Normal Muscle Bulk and Muscle Testing Reveals: Upper Extremities: Full ROM and Muscle Strength 5/5 Thoracic Paraspinal Tenderness: T-1- T-4 Lower Extremities: Full ROM and Muscle Strength 5/5 Arises from chair with ease Narrow Based Gait  Neurological: She is alert and oriented to person, place, and time.  Skin: Skin is warm and dry.  Psychiatric: She has a normal mood and affect.  Nursing note and vitals  reviewed.         Assessment & Plan:  1. Fibromyalgia with myofascial pain: Continue exercise and heat therapy.  2. Rotator cuff syndrome on the right: Continue with stretching exercises and heat therapy.  3. Cervicalgia. Post-laminectomy syndrome, facet arthropathy: Continue Voltaren gel. Refilled: MS Contin 15 mg one tablet every 12 hours as needed #60. Second script given. 4. Depression: Continue Cymbalta  5. Mid/low back pain: Continue Current Medication and stretching and heat therapy.  6 OA of right hand: No complaints voiced. Continue using Voltaren gel and heat therapy  7. Migraines: Continue Hydrocodone 5/325 mg one tablet every 6 hours as needed #60. Second script given  15 minutes of face to face patient care time was spent during this visit. All questions were encouraged and answered.   F/U in 1 month

## 2014-12-24 ENCOUNTER — Ambulatory Visit: Payer: Medicaid Other | Admitting: Physical Therapy

## 2014-12-26 ENCOUNTER — Ambulatory Visit: Payer: Medicaid Other | Admitting: Physical Therapy

## 2015-01-03 ENCOUNTER — Telehealth: Payer: Self-pay | Admitting: *Deleted

## 2015-01-03 NOTE — Telephone Encounter (Signed)
Take percocet back and fill with hydrocodone 5/325

## 2015-01-03 NOTE — Telephone Encounter (Signed)
Ryan pharmacist from [facility] called to let us know that they filled Robin Morgan's hydrocodone 5/325 #60 with oxycodone 5/325 #60.  I think she called them after taking 2 and half pills to let them know.  I spoke with her this morning and she has had no side effects.  She can tell it is a little stronger.  The pharmacy wants to know what you want them to do--take the med back and fill with hydrocodone or just leave it for this month.Marland KitchenMarland Kitchen

## 2015-01-03 NOTE — Telephone Encounter (Signed)
Notified Mechille and the pharmacy.

## 2015-02-04 ENCOUNTER — Telehealth: Payer: Self-pay | Admitting: *Deleted

## 2015-02-04 NOTE — Telephone Encounter (Signed)
Robin Morgan called because she has been in pain in her neck and shoulders since Friday and was in bed most of the weekend because of it.She is asking about prior auth on her Morphine.  This was sent to insurance on Friday 01/31/15. I checked and it was approved but for immediate release because ER was not specified.  I have refaxed request to urgent number and they will address within 1-2 hours

## 2015-02-04 NOTE — Telephone Encounter (Signed)
MS Contin was approved, approval # [id number]. I have notified Robin Morgan.  She says her pain is so much worse and wants to be seen.  We were able to get her in for an appt tomorrow with Dr Naaman Plummer @11 .

## 2015-02-05 ENCOUNTER — Ambulatory Visit
Admission: RE | Admit: 2015-02-05 | Discharge: 2015-02-05 | Disposition: A | Discharge status: Home / Self Care | Payer: Medicaid Other | Source: Ambulatory Visit | Attending: Physical Medicine & Rehabilitation | Admitting: Physical Medicine & Rehabilitation

## 2015-02-05 ENCOUNTER — Encounter: Payer: Medicaid Other | Attending: Physical Medicine and Rehabilitation | Admitting: Physical Medicine & Rehabilitation

## 2015-02-05 ENCOUNTER — Telehealth: Payer: Self-pay | Admitting: Physical Medicine & Rehabilitation

## 2015-02-05 ENCOUNTER — Encounter: Payer: Self-pay | Admitting: Physical Medicine & Rehabilitation

## 2015-02-05 VITALS — BP 120/71 | HR 96 | Resp 16

## 2015-02-05 DIAGNOSIS — M791 Myalgia: Secondary | ICD-10-CM | POA: Diagnosis present

## 2015-02-05 DIAGNOSIS — IMO0001 Reserved for inherently not codable concepts without codable children: Secondary | ICD-10-CM

## 2015-02-05 DIAGNOSIS — Z5181 Encounter for therapeutic drug level monitoring: Secondary | ICD-10-CM

## 2015-02-05 DIAGNOSIS — M961 Postlaminectomy syndrome, not elsewhere classified: Secondary | ICD-10-CM | POA: Diagnosis present

## 2015-02-05 DIAGNOSIS — M609 Myositis, unspecified: Secondary | ICD-10-CM | POA: Diagnosis present

## 2015-02-05 DIAGNOSIS — M5136 Other intervertebral disc degeneration, lumbar region: Secondary | ICD-10-CM

## 2015-02-05 DIAGNOSIS — F329 Major depressive disorder, single episode, unspecified: Secondary | ICD-10-CM

## 2015-02-05 DIAGNOSIS — F32A Depression, unspecified: Secondary | ICD-10-CM

## 2015-02-05 DIAGNOSIS — M6530 Trigger finger, unspecified finger: Secondary | ICD-10-CM

## 2015-02-05 DIAGNOSIS — M67919 Unspecified disorder of synovium and tendon, unspecified shoulder: Secondary | ICD-10-CM

## 2015-02-05 DIAGNOSIS — Z79899 Other long term (current) drug therapy: Secondary | ICD-10-CM

## 2015-02-05 MED ORDER — HYDROCODONE-ACETAMINOPHEN 5-325 MG PO TABS: 1.0000 | ORAL_TABLET | Freq: Four times a day (QID) | ORAL | Status: DC | PRN

## 2015-02-05 MED ORDER — MORPHINE SULFATE ER 15 MG PO TBCR: 15.0000 mg | EXTENDED_RELEASE_TABLET | Freq: Two times a day (BID) | ORAL | Status: DC

## 2015-02-05 NOTE — Telephone Encounter (Signed)
You can let Jleigh know there are some slight changes from her last xray in 2014 but nothing major enough to warrant further imaging. Continue with HEP and conservative care for now.   thx

## 2015-02-05 NOTE — Patient Instructions (Signed)
PLEASE CALL ME WITH ANY PROBLEMS OR QUESTIONS (#[phone]).  HAVE A GOOD DAY!    

## 2015-02-05 NOTE — Progress Notes (Signed)
Subjective:    Patient ID: Robin Morgan, female    DOB: [birth year], 59 y.o.   MRN: [id number]  HPI  Robin Morgan is here in follow up of her FMS and chronic pain. Robin Morgan had a terrible weekend with pain so bad that she couldn't stay out of bed for the weekend. She had to wait on prior auth for her MS Contin to go through and not sure if this had any impact on her situation.  Pain is most severe in her mid-lower back and the right side seems to be most affected. Her neck is also sore, but she hasn't gone for traction or PT yet due to multiple issues.  Of note, her pharmacy gave her oxycodone instead of hydrocodone, and she did not notice until after she had taken it--she returned to pharmacy. She does say that it worked better than the hydrocodone but the hydrocodone still helps.  She denies any new physical or emotional traumas.   Pain Inventory Average Pain 8 Pain Right Now 9 My pain is constant and aching  In the last 24 hours, has pain interfered with the following? General activity 10 Relation with others 9 Enjoyment of life 10 What TIME of day is your pain at its worst? all Sleep (in general) Poor  Pain is worse with: worse at night  Pain improves with: medication Relief from Meds: 6  Mobility walk without assistance ability to climb steps?  yes do you drive?  yes  Function disabled: date disabled .  Neuro/Psych bladder control problems bowel control problems spasms  jaw pain Prior Studies Any changes since last visit?  no  Physicians involved in your care Any changes since last visit?  no   Family History  Problem Relation Age of Onset  . Alzheimer's disease Mother   . Diabetes Father    History   Social History  . Marital Status: Divorced    Spouse Name: [name]  . Number of Children: 1  . Years of Education: [name]   Social History Main Topics  . Smoking status: Never Smoker   . Smokeless tobacco: Not on file  . Alcohol Use: No  . Drug Use: No    . Sexual Activity: Not on file   Other Topics Concern  . None   Social History Narrative   Past Surgical History  Procedure Laterality Date  . Abdominal hysterectomy    . Spine surgery    . Cholecystectomy    . Appendectomy    . Tonsillectomy    . Breast surgery      rt breast cyst  . Rotator cuff repair      right   Past Medical History  Diagnosis Date  . Myalgia   . Calcific tendonitis   . Cervical spondylosis without myelopathy   . Depression   . Pain in joint, upper arm   . Diabetes mellitus   . Hypertension   . Hiatal hernia   . Fibromyalgia   . Hyperlipidemia   . GERD (gastroesophageal reflux disease)   . Esophageal stricture    BP 120/71 mmHg  Pulse 96  Resp 16  SpO2 97%  Opioid Risk Score:   Fall Risk Score: Moderate Fall Risk (6-13 points) (previously educated and given handout )`1  Depression screen PHQ 2/9  Depression screen PHQ 2/9 11/21/2014  Decreased Interest 2  Down, Depressed, Hopeless 0  PHQ - 2 Score 2  Altered sleeping 2  Tired, decreased energy 2  Change in appetite  1  Feeling bad or failure about yourself  0  Trouble concentrating 1  Moving slowly or fidgety/restless 0  Suicidal thoughts 0  PHQ-9 Score 8    Review of Systems  Respiratory: Positive for shortness of breath.   Gastrointestinal: Positive for diarrhea and constipation.       Bowel Control Problems  Endocrine:       Blood sugar fluctuations  Genitourinary:       Bladder control problems  Musculoskeletal: Positive for neck pain and neck stiffness.  All other systems reviewed and are negative.      Objective:   Physical Exam  Constitutional: She is oriented to person, place, and time. She appears well-developed and well-nourished. She has gained a little weight.  HENT:  Head: Normocephalic and atraumatic.  Eyes: Conjunctivae and EOM are normal. Pupils are equal, round, and reactive to light. Perhaps a little exopthalmos  Neck: Normal range of motion.   Cardiovascular: Normal rate, regular rhythm and normal heart sounds.  Pulmonary/Chest: Breath sounds normal. No respiratory distress. She has no wheezes.  Abdominal: Soft. Bowel sounds are normal. She exhibits no distension.  Musculoskeletal: left hemipelvis is slightly elevated. Right lumbar paraspinals are tender and taut to palpation and reproduced pain with pressure. She had more pain with flexion than other planes of movement. Head position still forward. Tends to fall into left leg a bit during stance while ambulating. Neurological: She is alert and oriented to person, place, and time. She has normal strength and normal reflexes. No sensory deficit.  Psychiatric: She has a normal mood and affect but perhaps a little flat.Marland Kitchen Her behavior is normal. Judgment and thought content normal. Mood is pleasant as always    Assessment & Plan:   ASSESSMENT:  1. Fibromyalgia with myofascial pain. --affecting right shoulder girdle today  2. Rotator cuff syndrome on the right.  3. History of repair was with chronic pain.  4. Cervicalgia. Post-laminectomy syndrome, facet arthropathy symptoms, associated myofascial pain and headaches.    5. Depression- which has improved  7. Mid/low back pain-DDD---xrays of lumbar spine from 2014 show DDD in the upper lumbar segements. Likely myofascial, associated pain on the right side as well. 8. Decreased bone density  10. Trigger finger left middle   PLAN:  1. Will order a cervical traction unit which should help with myofascial symptoms and headaches.    2. Hydrocodone 5/325 one-half to one p.o. B.i.d prn. #60---a second rx was provided for next month.  3. Will order another xray of the lumbar spine to followup 2014 series (given increased pain)---focus on upper lumbar segments.   4. Continue low dose ms contin. 5. After informed consent and preparation of the skin with isopropyl alcohol, I injected the right lumbar parspinals (2) around the L1-3 levels with 2cc  of 1% lidocaine. The patient tolerated well, and no complications were experienced. Post-injection instructions were provided.   6. Follow up with me in about 1-2 months. 15 minutes of face to face patient care time were spent during this visit. All questions were encouraged and answered.

## 2015-02-06 NOTE — Telephone Encounter (Signed)
Notified [facility]

## 2015-02-25 ENCOUNTER — Ambulatory Visit: Payer: Medicaid Other | Admitting: Registered Nurse

## 2015-03-16 ENCOUNTER — Other Ambulatory Visit: Payer: Self-pay | Admitting: Physical Medicine & Rehabilitation

## 2015-03-20 ENCOUNTER — Other Ambulatory Visit: Payer: Self-pay | Admitting: Physical Medicine & Rehabilitation

## 2015-04-03 ENCOUNTER — Encounter: Payer: Medicaid Other | Attending: Physical Medicine and Rehabilitation | Admitting: Registered Nurse

## 2015-04-03 ENCOUNTER — Encounter: Payer: Self-pay | Admitting: Registered Nurse

## 2015-04-03 ENCOUNTER — Other Ambulatory Visit: Payer: Self-pay | Admitting: Registered Nurse

## 2015-04-03 VITALS — BP 137/60 | HR 89 | Resp 14

## 2015-04-03 DIAGNOSIS — M961 Postlaminectomy syndrome, not elsewhere classified: Secondary | ICD-10-CM | POA: Insufficient documentation

## 2015-04-03 DIAGNOSIS — Z79899 Other long term (current) drug therapy: Secondary | ICD-10-CM | POA: Diagnosis not present

## 2015-04-03 DIAGNOSIS — M791 Myalgia: Secondary | ICD-10-CM

## 2015-04-03 DIAGNOSIS — M67919 Unspecified disorder of synovium and tendon, unspecified shoulder: Secondary | ICD-10-CM

## 2015-04-03 DIAGNOSIS — M609 Myositis, unspecified: Secondary | ICD-10-CM | POA: Insufficient documentation

## 2015-04-03 DIAGNOSIS — G894 Chronic pain syndrome: Secondary | ICD-10-CM | POA: Diagnosis not present

## 2015-04-03 DIAGNOSIS — M6530 Trigger finger, unspecified finger: Secondary | ICD-10-CM

## 2015-04-03 DIAGNOSIS — Z5181 Encounter for therapeutic drug level monitoring: Secondary | ICD-10-CM | POA: Diagnosis not present

## 2015-04-03 DIAGNOSIS — IMO0001 Reserved for inherently not codable concepts without codable children: Secondary | ICD-10-CM

## 2015-04-03 DIAGNOSIS — M797 Fibromyalgia: Secondary | ICD-10-CM

## 2015-04-03 MED ORDER — MORPHINE SULFATE ER 15 MG PO TBCR: 15.0000 mg | EXTENDED_RELEASE_TABLET | Freq: Two times a day (BID) | ORAL | Status: DC

## 2015-04-03 MED ORDER — HYDROCODONE-ACETAMINOPHEN 5-325 MG PO TABS: 1.0000 | ORAL_TABLET | Freq: Four times a day (QID) | ORAL | Status: DC | PRN

## 2015-04-03 NOTE — Progress Notes (Signed)
Subjective:    Patient ID: Robin Morgan, female    DOB: 07/17/1956, 59 y.o.   MRN: [id number]  HPI: Robin Morgan is a 59 year old female who returns for follow up for chronic pain and medication refill. She says her pain is located in her neck and upper-mid-back. She rates her pain 8. Her current exercise regime is walking and performing stretching exercises.  S/P trigger point injection with good results noted.  Pain Inventory Average Pain 8 Pain Right Now 8 My pain is constant, tingling and aching  In the last 24 hours, has pain interfered with the following? General activity 8 Relation with others 7 Enjoyment of life 8 What TIME of day is your pain at its worst? morning, evening, night Sleep (in general) Poor  Pain is worse with: sitting and standing Pain improves with: heat/ice and medication Relief from Meds: 9  Mobility walk without assistance ability to climb steps?  yes do you drive?  yes  Function disabled: date disabled .  Neuro/Psych bladder control problems numbness spasms  Prior Studies Any changes since last visit?  no  Physicians involved in your care Any changes since last visit?  no   Family History  Problem Relation Age of Onset  . Alzheimer's disease Mother   . Diabetes Father    Social History   Social History  . Marital Status: Divorced    Spouse Name: [name]  . Number of Children: 1  . Years of Education: [name]   Social History Main Topics  . Smoking status: Never Smoker   . Smokeless tobacco: None  . Alcohol Use: No  . Drug Use: No  . Sexual Activity: Not Asked   Other Topics Concern  . None   Social History Narrative   Past Surgical History  Procedure Laterality Date  . Abdominal hysterectomy    . Spine surgery    . Cholecystectomy    . Appendectomy    . Tonsillectomy    . Breast surgery      rt breast cyst  . Rotator cuff repair      right   Past Medical History  Diagnosis Date  . Myalgia   . Calcific  tendonitis   . Cervical spondylosis without myelopathy   . Depression   . Pain in joint, upper arm   . Diabetes mellitus   . Hypertension   . Hiatal hernia   . Fibromyalgia   . Hyperlipidemia   . GERD (gastroesophageal reflux disease)   . Esophageal stricture    BP 137/60 mmHg  Pulse 89  Resp 14  SpO2 93%  Opioid Risk Score:   Fall Risk Score:  `1  Depression screen PHQ 2/9  Depression screen PHQ 2/9 11/21/2014  Decreased Interest 2  Down, Depressed, Hopeless 0  PHQ - 2 Score 2  Altered sleeping 2  Tired, decreased energy 2  Change in appetite 1  Feeling bad or failure about yourself  0  Trouble concentrating 1  Moving slowly or fidgety/restless 0  Suicidal thoughts 0  PHQ-9 Score 8     Review of Systems  Constitutional: Positive for diaphoresis.  Respiratory: Positive for shortness of breath.   Gastrointestinal: Positive for diarrhea and constipation.  Endocrine:       High blood sugar Low blood sugar   Genitourinary: Positive for dysuria.  Neurological:       Spasms  All other systems reviewed and are negative.      Objective:  Physical Exam  Constitutional: She is oriented to person, place, and time. She appears well-developed and well-nourished.  HENT:  Head: Normocephalic and atraumatic.  Neck: Normal range of motion. Neck supple.  Cervical Paraspinal Tenderness: C-5- C-6  Cardiovascular: Normal rate and regular rhythm.   Pulmonary/Chest: Effort normal and breath sounds normal.  Musculoskeletal:  Normal Muscle Bulk and Muscle Testing Reveals: Upper Extremities: Full ROM and Muscle Strength 5/5 Thoracic Paraspinal Tenderness: T-1- T-3 T-6- T-8 Lower Extremities: Full ROM and Muscle Strength 5/5 Arises from chair with ease Narrow based Gait  Neurological: She is alert and oriented to person, place, and time.  Skin: Skin is warm and dry.  Psychiatric: She has a normal mood and affect.  Nursing note and vitals reviewed.         Assessment  & Plan:  1. Fibromyalgia with myofascial pain: Continue exercise and heat therapy.  2. Rotator cuff syndrome on the right: Continue with stretching exercises and heat therapy.  3. Cervicalgia. Post-laminectomy syndrome, facet arthropathy: Continue Voltaren gel. Refilled: MS Contin 15 mg one tablet every 12 hours as needed #60. Second script given for the following month. 4. Depression: Continue Cymbalta  5. Mid/low back pain: Continue Current Medication and stretching and heat therapy.  6 OA of right hand: No complaints voiced. Continue using Voltaren gel and heat therapy  7. Migraines: Continue Hydrocodone 5/325 mg one tablet every 6 hours as needed #60. Second script given for the following month.  15 minutes of face to face patient care time was spent during this visit. All questions were encouraged and answered.   F/U in 1 month

## 2015-04-04 LAB — PMP ALCOHOL METABOLITE (ETG): ETGU: NEGATIVE ng/mL

## 2015-04-07 LAB — OPIATES/OPIOIDS (LC/MS-MS)
Codeine Urine: NEGATIVE ng/mL (ref <50)
HYDROCODONE: 565 ng/mL (ref <50)
Hydromorphone: 194 ng/mL (ref <50)
Morphine Urine: 19720 ng/mL (ref <50)
NOROXYCODONE, UR: NEGATIVE ng/mL (ref <50)
Norhydrocodone, Ur: 785 ng/mL (ref <50)
OXYCODONE, UR: NEGATIVE ng/mL (ref <50)
Oxymorphone: NEGATIVE ng/mL (ref <50)

## 2015-04-07 LAB — ZOLPIDEM (LC/MS-MS), URINE
ZOLPIDEM (GC/LC/MS), UR CONFIRM: 66 ng/mL (ref <5)
Zolpidem metabolite (GC/LC/MS) Ur, confirm: 1150 ng/mL (ref <5)

## 2015-04-07 LAB — MDMA (ECSTASY), URINE
MDMA GC/MS Conf: NEGATIVE ng/mL (ref <200)
METHYLENEDIOXYAMPHETAMINE: NEGATIVE ng/mL (ref <200)

## 2015-04-09 LAB — PRESCRIPTION MONITORING PROFILE (SOLSTAS)
AMPHETAMINE/METH: NEGATIVE ng/mL
BARBITURATE SCREEN, URINE: NEGATIVE ng/mL
BUPRENORPHINE, URINE: NEGATIVE ng/mL
Benzodiazepine Screen, Urine: NEGATIVE ng/mL
CANNABINOID SCRN UR: NEGATIVE ng/mL
CREATININE, URINE: 132.13 mg/dL (ref >20.0)
Carisoprodol, Urine: NEGATIVE ng/mL
Cocaine Metabolites: NEGATIVE ng/mL
Fentanyl, Ur: NEGATIVE ng/mL
MEPERIDINE UR: NEGATIVE ng/mL
METHADONE SCREEN, URINE: NEGATIVE ng/mL
Nitrites, Initial: NEGATIVE ug/mL
OXYCODONE SCRN UR: NEGATIVE ng/mL
PH URINE, INITIAL: 5.2 pH (ref 4.5–8.9)
Propoxyphene: NEGATIVE ng/mL
TAPENTADOLUR: NEGATIVE ng/mL
Tramadol Scrn, Ur: NEGATIVE ng/mL

## 2015-04-17 ENCOUNTER — Other Ambulatory Visit: Payer: Self-pay | Admitting: Physical Medicine & Rehabilitation

## 2015-04-19 NOTE — Progress Notes (Signed)
Urine drug screen for this encounter is consistent for prescribed medication 

## 2015-05-28 ENCOUNTER — Encounter: Payer: Medicaid Other | Admitting: Physical Medicine & Rehabilitation

## 2015-05-31 ENCOUNTER — Ambulatory Visit: Payer: Medicaid Other | Admitting: Physical Medicine & Rehabilitation

## 2015-06-05 ENCOUNTER — Encounter: Payer: Self-pay | Admitting: Registered Nurse

## 2015-06-05 ENCOUNTER — Encounter: Payer: Medicaid Other | Attending: Physical Medicine and Rehabilitation | Admitting: Registered Nurse

## 2015-06-05 VITALS — BP 123/66 | HR 69 | Resp 16

## 2015-06-05 DIAGNOSIS — M797 Fibromyalgia: Secondary | ICD-10-CM

## 2015-06-05 DIAGNOSIS — IMO0001 Reserved for inherently not codable concepts without codable children: Secondary | ICD-10-CM

## 2015-06-05 DIAGNOSIS — Z79899 Other long term (current) drug therapy: Secondary | ICD-10-CM | POA: Diagnosis not present

## 2015-06-05 DIAGNOSIS — M961 Postlaminectomy syndrome, not elsewhere classified: Secondary | ICD-10-CM | POA: Diagnosis present

## 2015-06-05 DIAGNOSIS — M791 Myalgia: Secondary | ICD-10-CM | POA: Diagnosis present

## 2015-06-05 DIAGNOSIS — M609 Myositis, unspecified: Secondary | ICD-10-CM | POA: Diagnosis present

## 2015-06-05 DIAGNOSIS — G894 Chronic pain syndrome: Secondary | ICD-10-CM

## 2015-06-05 DIAGNOSIS — F329 Major depressive disorder, single episode, unspecified: Secondary | ICD-10-CM

## 2015-06-05 DIAGNOSIS — Z5181 Encounter for therapeutic drug level monitoring: Secondary | ICD-10-CM | POA: Diagnosis not present

## 2015-06-05 DIAGNOSIS — F32A Depression, unspecified: Secondary | ICD-10-CM

## 2015-06-05 MED ORDER — HYDROCODONE-ACETAMINOPHEN 5-325 MG PO TABS: 1.0000 | ORAL_TABLET | Freq: Four times a day (QID) | ORAL | Status: DC | PRN

## 2015-06-05 MED ORDER — MORPHINE SULFATE ER 15 MG PO TBCR: 15.0000 mg | EXTENDED_RELEASE_TABLET | Freq: Two times a day (BID) | ORAL | Status: DC

## 2015-06-05 NOTE — Progress Notes (Signed)
Subjective:    Patient ID: Robin Morgan, female    DOB: 1956/05/01, 59 y.o.   MRN: [id number]  HPI: Robin Morgan is a 59 year old female who returns for follow up for chronic pain and medication refill. She says her pain is located in her upper-mid-back, right hip and right lower extremity. She rates her pain 8. Her current exercise regime is walking and performing stretching exercises.  Pain Inventory Average Pain 9 Pain Right Now 8 My pain is aching  In the last 24 hours, has pain interfered with the following? General activity 7 Relation with others 7 Enjoyment of life 7 What TIME of day is your pain at its worst? Morning and Night Sleep (in general) Poor  Pain is worse with: standing and some activites Pain improves with: heat/ice and medication Relief from Meds: 9  Mobility ability to climb steps?  yes do you drive?  yes  Function disabled: date disabled NA  Neuro/Psych bladder control problems numbness spasms  Prior Studies Any changes since last visit?  no  Physicians involved in your care Any changes since last visit?  no   Family History  Problem Relation Age of Onset  . Alzheimer's disease Mother   . Diabetes Father    Social History   Social History  . Marital Status: Divorced    Spouse Name: [name]  . Number of Children: 1  . Years of Education: [name]   Social History Main Topics  . Smoking status: Never Smoker   . Smokeless tobacco: None  . Alcohol Use: No  . Drug Use: No  . Sexual Activity: Not Asked   Other Topics Concern  . None   Social History Narrative   Past Surgical History  Procedure Laterality Date  . Abdominal hysterectomy    . Spine surgery    . Cholecystectomy    . Appendectomy    . Tonsillectomy    . Breast surgery      rt breast cyst  . Rotator cuff repair      right   Past Medical History  Diagnosis Date  . Myalgia   . Calcific tendonitis   . Cervical spondylosis without myelopathy   . Depression    . Pain in joint, upper arm   . Diabetes mellitus   . Hypertension   . Hiatal hernia   . Fibromyalgia   . Hyperlipidemia   . GERD (gastroesophageal reflux disease)   . Esophageal stricture    BP 123/66 mmHg  Pulse 69  Resp 16  SpO2 96%  Opioid Risk Score:   Fall Risk Score:  `1  Depression screen PHQ 2/9  Depression screen PHQ 2/9 11/21/2014  Decreased Interest 2  Down, Depressed, Hopeless 0  PHQ - 2 Score 2  Altered sleeping 2  Tired, decreased energy 2  Change in appetite 1  Feeling bad or failure about yourself  0  Trouble concentrating 1  Moving slowly or fidgety/restless 0  Suicidal thoughts 0  PHQ-9 Score 8     Review of Systems  Constitutional: Positive for diaphoresis and unexpected weight change.  Gastrointestinal: Positive for constipation.  Endocrine:       High Blood Sugar  Genitourinary:       Bladder Control Problems  Musculoskeletal:       Spasms  All other systems reviewed and are negative.      Objective:   Physical Exam  Constitutional: She is oriented to person, place, and time. She appears  well-developed and well-nourished.  HENT:  Head: Normocephalic and atraumatic.  Neck: Normal range of motion. Neck supple.  Cardiovascular: Normal rate and regular rhythm.   Pulmonary/Chest: Effort normal and breath sounds normal.  Musculoskeletal:  Normal Muscle Bulk and Muscle Testing Reveals: Upper Extremities: Full ROM and Muscle Strength 5/5 Thoracic Paraspinal Tenderness T-7- T-10 Lower Extremities: Full ROM and Muscle Strength 5/5 Arises from chair with ease Narrow Based Gait  Neurological: She is alert and oriented to person, place, and time.  Skin: Skin is warm and dry.  Psychiatric: She has a normal mood and affect.  Nursing note and vitals reviewed.         Assessment & Plan:  1. Fibromyalgia with myofascial pain: Continue exercise and heat therapy.  2. Rotator cuff syndrome on the right: Continue with stretching exercises and  heat therapy.  3. Cervicalgia. Post-laminectomy syndrome, facet arthropathy: Continue Voltaren gel. Refilled: MS Contin 15 mg one tablet every 12 hours as needed #60. Second script given for the following month. 4. Depression: Continue Cymbalta  5. Mid/low back pain: Continue Current Medication and stretching and heat therapy.  6 OA of right hand: No complaints voiced. Continue using Voltaren gel and heat therapy  7. Migraines: Continue Hydrocodone 5/325 mg one tablet every 6 hours as needed #60. Second script given for the following month.  15 minutes of face to face patient care time was spent during this visit. All questions were encouraged and answered.   F/U in 1 month

## 2015-07-24 ENCOUNTER — Other Ambulatory Visit: Payer: Self-pay

## 2015-07-24 ENCOUNTER — Other Ambulatory Visit: Payer: Self-pay | Admitting: Family Medicine

## 2015-07-24 DIAGNOSIS — Z1231 Encounter for screening mammogram for malignant neoplasm of breast: Secondary | ICD-10-CM

## 2015-08-07 ENCOUNTER — Encounter: Payer: Medicaid Other | Attending: Physical Medicine and Rehabilitation | Admitting: Registered Nurse

## 2015-08-07 ENCOUNTER — Encounter: Payer: Self-pay | Admitting: Registered Nurse

## 2015-08-07 ENCOUNTER — Other Ambulatory Visit: Payer: Self-pay | Admitting: Physical Medicine & Rehabilitation

## 2015-08-07 ENCOUNTER — Other Ambulatory Visit: Payer: Self-pay | Admitting: Registered Nurse

## 2015-08-07 VITALS — BP 134/73 | HR 87

## 2015-08-07 DIAGNOSIS — M791 Myalgia: Secondary | ICD-10-CM | POA: Diagnosis not present

## 2015-08-07 DIAGNOSIS — M961 Postlaminectomy syndrome, not elsewhere classified: Secondary | ICD-10-CM | POA: Insufficient documentation

## 2015-08-07 DIAGNOSIS — G894 Chronic pain syndrome: Secondary | ICD-10-CM

## 2015-08-07 DIAGNOSIS — M609 Myositis, unspecified: Secondary | ICD-10-CM | POA: Diagnosis present

## 2015-08-07 DIAGNOSIS — G43101 Migraine with aura, not intractable, with status migrainosus: Secondary | ICD-10-CM

## 2015-08-07 DIAGNOSIS — Z5181 Encounter for therapeutic drug level monitoring: Secondary | ICD-10-CM

## 2015-08-07 DIAGNOSIS — Z79899 Other long term (current) drug therapy: Secondary | ICD-10-CM | POA: Diagnosis not present

## 2015-08-07 DIAGNOSIS — M797 Fibromyalgia: Secondary | ICD-10-CM

## 2015-08-07 DIAGNOSIS — IMO0001 Reserved for inherently not codable concepts without codable children: Secondary | ICD-10-CM

## 2015-08-07 MED ORDER — HYDROCODONE-ACETAMINOPHEN 5-325 MG PO TABS: 1.0000 | ORAL_TABLET | Freq: Four times a day (QID) | ORAL | Status: DC | PRN

## 2015-08-07 MED ORDER — MORPHINE SULFATE ER 15 MG PO TBCR
15.0000 mg | EXTENDED_RELEASE_TABLET | Freq: Two times a day (BID) | ORAL | Status: DC
Start: 2015-08-07 — End: 2015-10-16

## 2015-08-07 MED ORDER — MORPHINE SULFATE ER 15 MG PO TBCR: 15.0000 mg | EXTENDED_RELEASE_TABLET | Freq: Two times a day (BID) | ORAL | Status: DC

## 2015-08-07 NOTE — Progress Notes (Signed)
Subjective:    Patient ID: Robin Morgan, female    DOB: [birth year], 59 y.o.   MRN: [id number]  HPI: [name] is a 59 year old female who returns for follow up for chronic pain and medication refill. She says her pain is located in her bilateral shoulders right greater than left, upper-mid-back. Also states she had a migraine a few days ago it lasted for two days. She denies having a  headache at this time. She rates her pain 9. Her current exercise regime is walking and performing stretching exercises. She thought she had prescription on hold at pharmacy, [facility] called she has a hydrocodone on file, she was only given one prescription of hydrocodone for the following month.  Pain Inventory Average Pain 8 Pain Right Now 9 My pain is constant, stabbing and aching  In the last 24 hours, has pain interfered with the following? General activity 9 Relation with others 9 Enjoyment of life 9 What TIME of day is your pain at its worst? Evening and Night Sleep (in general) Poor  Pain is worse with: standing Pain improves with: medication Relief from Meds: 6  Mobility ability to climb steps?  yes do you drive?  yes  Function disabled: date disabled NA  Neuro/Psych weakness numbness spasms  Prior Studies Any changes since last visit?  no  Physicians involved in your care Any changes since last visit?  no   Family History  Problem Relation Age of Onset  . Alzheimer's disease Mother   . Diabetes Father    Social History   Social History  . Marital Status: Divorced    Spouse Name: [name]  . Number of Children: 1  . Years of Education: [name]   Social History Main Topics  . Smoking status: Never Smoker   . Smokeless tobacco: None  . Alcohol Use: No  . Drug Use: No  . Sexual Activity: Not Asked   Other Topics Concern  . None   Social History Narrative   Past Surgical History  Procedure Laterality Date  . Abdominal hysterectomy    . Spine surgery    .  Cholecystectomy    . Appendectomy    . Tonsillectomy    . Breast surgery      rt breast cyst  . Rotator cuff repair      right   Past Medical History  Diagnosis Date  . Myalgia   . Calcific tendonitis   . Cervical spondylosis without myelopathy   . Depression   . Pain in joint, upper arm   . Diabetes mellitus   . Hypertension   . Hiatal hernia   . Fibromyalgia   . Hyperlipidemia   . GERD (gastroesophageal reflux disease)   . Esophageal stricture    BP 134/73 mmHg  Pulse 87  SpO2 97%  Opioid Risk Score:   Fall Risk Score:  `1  Depression screen PHQ 2/9  Depression screen PHQ 2/9 11/21/2014  Decreased Interest 2  Down, Depressed, Hopeless 0  PHQ - 2 Score 2  Altered sleeping 2  Tired, decreased energy 2  Change in appetite 1  Feeling bad or failure about yourself  0  Trouble concentrating 1  Moving slowly or fidgety/restless 0  Suicidal thoughts 0  PHQ-9 Score 8     Review of Systems  Constitutional: Positive for diaphoresis.  Respiratory: Positive for shortness of breath.   Gastrointestinal: Positive for constipation.  Endocrine:       High Blood Sugar  Low Blood Sugar  Musculoskeletal:       Spasms  Neurological: Positive for weakness and numbness.       Objective:   Physical Exam  Constitutional: She is oriented to person, place, and time. She appears well-developed and well-nourished.  HENT:  Head: Normocephalic and atraumatic.  Neck: Normal range of motion. Neck supple.  Cervical Paraspinal Tendeness: C-5- C-6  Cardiovascular: Normal rate and regular rhythm.   Pulmonary/Chest: Effort normal and breath sounds normal.  Musculoskeletal:  Normal Muscle Bulk and Muscle Testing Reveals: Bilateral AC Joint Tenderness Upper Extremities: Full ROM and Muscle Strength 5/5 Back without spinal or paraspinal tenderness Lower Extremities: Full ROM and Muscle Strength 5/5 Arises from chair with ease Narrow Based gait   Neurological: She is alert and  oriented to person, place, and time.  Skin: Skin is warm and dry.  Psychiatric: She has a normal mood and affect.  Nursing note and vitals reviewed.         Assessment & Plan:  1. Fibromyalgia with myofascial pain: Continue exercise and heat therapy.  2. Rotator cuff syndrome on the right: Continue with stretching exercises and heat therapy.  3. Cervicalgia. Post-laminectomy syndrome, facet arthropathy: Continue Voltaren gel. Refilled: MS Contin 15 mg one tablet every 12 hours as needed #60. Second script given for the following month. 4. Depression: Continue Cymbalta  5. Mid/low back pain: Continue Current Medication and stretching and heat therapy.  6 OA of right hand: No complaints voiced. Continue using Voltaren gel and heat therapy  7. Migraines: Continue Hydrocodone 5/325 mg one tablet every 6 hours as needed #60.   15 minutes of face to face patient care time was spent during this visit. All questions were encouraged and answered.   F/U in 2 months

## 2015-08-08 LAB — PMP ALCOHOL METABOLITE (ETG): Ethyl Glucuronide (EtG): NEGATIVE ng/mL

## 2015-08-10 LAB — OPIATES/OPIOIDS (LC/MS-MS)
Codeine Urine: NEGATIVE ng/mL (ref <50)
HYDROMORPHONE: 235 ng/mL (ref <50)
Hydrocodone: 855 ng/mL (ref <50)
MORPHINE: 21512 ng/mL (ref <50)
NORHYDROCODONE, UR: 1019 ng/mL (ref <50)
NOROXYCODONE, UR: NEGATIVE ng/mL (ref <50)
OXYCODONE, UR: NEGATIVE ng/mL (ref <50)
Oxymorphone: NEGATIVE ng/mL (ref <50)

## 2015-08-10 LAB — PRESCRIPTION MONITORING PROFILE (SOLSTAS)
AMPHETAMINE/METH: NEGATIVE ng/mL
BARBITURATE SCREEN, URINE: NEGATIVE ng/mL
Benzodiazepine Screen, Urine: NEGATIVE ng/mL
Buprenorphine, Urine: NEGATIVE ng/mL
COCAINE METABOLITES: NEGATIVE ng/mL
Cannabinoid Scrn, Ur: NEGATIVE ng/mL
Carisoprodol, Urine: NEGATIVE ng/mL
Creatinine, Urine: 142.66 mg/dL (ref >20.0)
Fentanyl, Ur: NEGATIVE ng/mL
METHADONE SCREEN, URINE: NEGATIVE ng/mL
Meperidine, Ur: NEGATIVE ng/mL
NITRITES URINE, INITIAL: NEGATIVE ug/mL
OXYCODONE SCRN UR: NEGATIVE ng/mL
PROPOXYPHENE: NEGATIVE ng/mL
TAPENTADOLUR: NEGATIVE ng/mL
Tramadol Scrn, Ur: NEGATIVE ng/mL
pH, Initial: 4.7 pH (ref 4.5–8.9)

## 2015-08-10 LAB — MDMA (ECSTASY), URINE
MDA GC/MS confirm: NEGATIVE ng/mL (ref <200)
MDMA GC/MS Conf: NEGATIVE ng/mL (ref <200)

## 2015-08-10 LAB — ZOLPIDEM (LC/MS-MS), URINE
ZOLPIDEM (GC/LC/MS), UR CONFIRM: 93 ng/mL — AB (ref <5)
Zolpidem metabolite (GC/LC/MS) Ur, confirm: 619 ng/mL — AB (ref <5)

## 2015-08-14 ENCOUNTER — Ambulatory Visit
Admission: RE | Admit: 2015-08-14 | Discharge: 2015-08-14 | Disposition: A | Discharge status: Home / Self Care | Payer: Medicaid Other | Source: Ambulatory Visit

## 2015-08-14 DIAGNOSIS — Z1231 Encounter for screening mammogram for malignant neoplasm of breast: Secondary | ICD-10-CM

## 2015-09-07 ENCOUNTER — Other Ambulatory Visit: Payer: Self-pay | Admitting: Physical Medicine & Rehabilitation

## 2015-09-13 ENCOUNTER — Other Ambulatory Visit: Payer: Self-pay | Admitting: Physical Medicine & Rehabilitation

## 2015-09-16 NOTE — Telephone Encounter (Signed)
Please advise on refill. Thanks. 

## 2015-10-08 ENCOUNTER — Encounter: Payer: Self-pay | Admitting: Physical Medicine & Rehabilitation

## 2015-10-08 ENCOUNTER — Encounter: Payer: Medicaid Other | Attending: Physical Medicine and Rehabilitation | Admitting: Physical Medicine & Rehabilitation

## 2015-10-08 VITALS — BP 133/67 | HR 95

## 2015-10-08 DIAGNOSIS — IMO0001 Reserved for inherently not codable concepts without codable children: Secondary | ICD-10-CM

## 2015-10-08 DIAGNOSIS — M609 Myositis, unspecified: Secondary | ICD-10-CM | POA: Diagnosis not present

## 2015-10-08 DIAGNOSIS — M6530 Trigger finger, unspecified finger: Secondary | ICD-10-CM | POA: Diagnosis not present

## 2015-10-08 DIAGNOSIS — M961 Postlaminectomy syndrome, not elsewhere classified: Secondary | ICD-10-CM | POA: Insufficient documentation

## 2015-10-08 DIAGNOSIS — M791 Myalgia: Secondary | ICD-10-CM | POA: Diagnosis not present

## 2015-10-08 NOTE — Patient Instructions (Signed)
  PLEASE CALL ME WITH ANY PROBLEMS OR QUESTIONS (#[phone]).      

## 2015-10-08 NOTE — Progress Notes (Signed)
Subjective:    Patient ID: Robin Morgan, female    DOB: 06-25-1956, 60 y.o.   MRN: [id number]  HPI   Robin Morgan is here in follow up of her chronic pain. I last saw her in June. She had some headaches in the [place] but hasn't had any for the last month or so. The symptoms often originate in the right shoulder/traps. She tries to stretch most days.   She is on ms contin and hydrocodone per baseline. She is compliant with our protocol.   Pain Inventory Average Pain 8 Pain Right Now 8 My pain is constant and aching  In the last 24 hours, has pain interfered with the following? General activity 7 Relation with others 7 Enjoyment of life 7 What TIME of day is your pain at its worst? moning and evening Sleep (in general) NA  Pain is worse with: standing Pain improves with: heat/ice and medication Relief from Meds: 8  Mobility ability to climb steps?  yes do you drive?  yes  Function disabled: date disabled .  Neuro/Psych bladder control problems bowel control problems spasms  Prior Studies Any changes since last visit?  no  Physicians involved in your care Any changes since last visit?  no   Family History  Problem Relation Age of Onset  . Alzheimer's disease Mother   . Diabetes Father    Social History   Social History  . Marital Status: Divorced    Spouse Name: [name]  . Number of Children: 1  . Years of Education: [name]   Social History Main Topics  . Smoking status: Never Smoker   . Smokeless tobacco: None  . Alcohol Use: No  . Drug Use: No  . Sexual Activity: Not Asked   Other Topics Concern  . None   Social History Narrative   Past Surgical History  Procedure Laterality Date  . Abdominal hysterectomy    . Spine surgery    . Cholecystectomy    . Appendectomy    . Tonsillectomy    . Breast surgery      rt breast cyst  . Rotator cuff repair      right   Past Medical History  Diagnosis Date  . Myalgia   . Calcific tendonitis   . Cervical  spondylosis without myelopathy   . Depression   . Pain in joint, upper arm   . Diabetes mellitus   . Hypertension   . Hiatal hernia   . Fibromyalgia   . Hyperlipidemia   . GERD (gastroesophageal reflux disease)   . Esophageal stricture    BP 133/67 mmHg  Pulse 95  SpO2 97%  Opioid Risk Score:   Fall Risk Score:  `1  Depression screen PHQ 2/9  Depression screen [facility] 2/9 10/08/2015 11/21/2014  Decreased Interest 2 2  Down, Depressed, Hopeless 0 0  PHQ - 2 Score 2 2  Altered sleeping - 2  Tired, decreased energy - 2  Change in appetite - 1  Feeling bad or failure about yourself  - 0  Trouble concentrating - 1  Moving slowly or fidgety/restless - 0  Suicidal thoughts - 0  PHQ-9 Score - 8     Review of Systems  Respiratory: Positive for shortness of breath.   Gastrointestinal: Positive for constipation.  All other systems reviewed and are negative.      Objective:   Physical Exam  Constitutional: She is oriented to person, place, and time. She appears well-developed and well-nourished. She has gained  a little weight.  HENT:  Head: Normocephalic and atraumatic.  Eyes: Conjunctivae and EOM are normal. Pupils are equal, round, and reactive to light. Perhaps a little exopthalmos  Neck: Normal range of motion.  Cardiovascular: Normal rate, regular rhythm and normal heart sounds.  Pulmonary/Chest: Breath sounds normal. No respiratory distress. She has no wheezes.  Abdominal: Soft. Bowel sounds are normal. She exhibits no distension.  Musculoskeletal: Left hemipelvis more balanced but still elevated. Right lumbar paraspinals are tender and taut to palpation and reproduced pain with pressure.   Head position improved, overall posture better. No focal trigger points.  Neurological: She is alert and oriented to person, place, and time. She has normal strength and normal reflexes. No sensory deficit.  Psychiatric: She has a normal mood and affect but perhaps a little flat.Marland Kitchen Her  behavior is normal. Judgment and thought content normal. Mood is pleasant as always   Assessment & Plan:   ASSESSMENT:  1. Fibromyalgia with myofascial pain. --affecting right shoulder girdle today  2. Rotator cuff syndrome on the right.  3. History of repair was with chronic pain.  4. Cervicalgia. Post-laminectomy syndrome, facet arthropathy symptoms, associated myofascial pain and headaches.  5. Depression- which has improved  7. Mid/low back pain-DDD---xrays of lumbar spine from 2014 show DDD in the upper lumbar segements. Likely myofascial, associated pain on the right side as well.  8. Decreased bone density  10. Trigger finger left middle   PLAN:  1. Discussed maintenance of HEP as well as good posture/mechanics.  2. Hydrocodone 5/325 one-half to one p.o. B.i.d prn. #60---a second rx was provided for next month.  3. Will order another xray of the lumbar spine to followup 2014 series (given increased pain)---focus on upper lumbar segments.  4. Continue low dose ms contin.  5.. Follow up with me in about 2 months. 15 minutes of face to face patient care time were spent during this visit. All questions were encouraged and answered.

## 2015-10-15 ENCOUNTER — Telehealth: Payer: Self-pay | Admitting: *Deleted

## 2015-10-15 DIAGNOSIS — Z5181 Encounter for therapeutic drug level monitoring: Secondary | ICD-10-CM

## 2015-10-15 DIAGNOSIS — Z79899 Other long term (current) drug therapy: Secondary | ICD-10-CM

## 2015-10-15 DIAGNOSIS — IMO0001 Reserved for inherently not codable concepts without codable children: Secondary | ICD-10-CM

## 2015-10-15 NOTE — Telephone Encounter (Signed)
Attempted to notify Robin Morgan that her handicap placard request is ready.  Her mail box is full and cannot accept messages. It has been placed up front for pickup.

## 2015-10-15 NOTE — Telephone Encounter (Signed)
Pt left a message stating that when she left her appt on 10/08/2015 with Dr. Naaman Plummer, she did not receive scripts for the next month.  She had just filled her script and came to her 10/08/2015 visit with full containers.  She is already coming by to pick up her handicap parking placard paperwork and would like to pick up next months scripts at the same time.    Her last fill date was 10/06/2015.  Her next appt is 12/04/2015...Marland Kitchenplease advise

## 2015-10-15 NOTE — Telephone Encounter (Signed)
i wrote new rx'es on 2/28 it appears. With her full bottle and the new rx, she should have meds for next month

## 2015-10-16 MED ORDER — MORPHINE SULFATE ER 15 MG PO TBCR: 15.0000 mg | EXTENDED_RELEASE_TABLET | Freq: Two times a day (BID) | ORAL | Status: DC

## 2015-10-16 MED ORDER — HYDROCODONE-ACETAMINOPHEN 5-325 MG PO TABS: 1.0000 | ORAL_TABLET | Freq: Four times a day (QID) | ORAL | Status: DC | PRN

## 2015-10-16 NOTE — Telephone Encounter (Signed)
rx'es written 

## 2015-10-16 NOTE — Telephone Encounter (Signed)
The clinic visit note indicates that, but there is no notification at the bottom indicating medications ordered during visit and the last start date is 08/07/2015.   The patient states that she did not receive scripts for the next month

## 2015-10-17 NOTE — Telephone Encounter (Signed)
Patient called.

## 2015-11-20 LAB — HM COLONOSCOPY

## 2015-12-04 ENCOUNTER — Encounter: Payer: Self-pay | Admitting: Physical Medicine & Rehabilitation

## 2015-12-04 ENCOUNTER — Encounter: Payer: Medicaid Other | Attending: Physical Medicine and Rehabilitation | Admitting: Physical Medicine & Rehabilitation

## 2015-12-04 ENCOUNTER — Ambulatory Visit
Admission: RE | Admit: 2015-12-04 | Discharge: 2015-12-04 | Disposition: A | Discharge status: Home / Self Care | Payer: Medicaid Other | Source: Ambulatory Visit | Attending: Physical Medicine & Rehabilitation | Admitting: Physical Medicine & Rehabilitation

## 2015-12-04 VITALS — BP 133/72 | HR 98 | Resp 15

## 2015-12-04 DIAGNOSIS — M791 Myalgia: Secondary | ICD-10-CM | POA: Diagnosis not present

## 2015-12-04 DIAGNOSIS — M609 Myositis, unspecified: Secondary | ICD-10-CM

## 2015-12-04 DIAGNOSIS — IMO0001 Reserved for inherently not codable concepts without codable children: Secondary | ICD-10-CM

## 2015-12-04 DIAGNOSIS — Z79899 Other long term (current) drug therapy: Secondary | ICD-10-CM

## 2015-12-04 DIAGNOSIS — M961 Postlaminectomy syndrome, not elsewhere classified: Secondary | ICD-10-CM

## 2015-12-04 DIAGNOSIS — Z5181 Encounter for therapeutic drug level monitoring: Secondary | ICD-10-CM

## 2015-12-04 DIAGNOSIS — G894 Chronic pain syndrome: Secondary | ICD-10-CM

## 2015-12-04 MED ORDER — MORPHINE SULFATE ER 15 MG PO TBCR: 15.0000 mg | EXTENDED_RELEASE_TABLET | Freq: Two times a day (BID) | ORAL | Status: DC

## 2015-12-04 MED ORDER — HYDROCODONE-ACETAMINOPHEN 5-325 MG PO TABS: 1.0000 | ORAL_TABLET | Freq: Four times a day (QID) | ORAL | Status: DC | PRN

## 2015-12-04 NOTE — Progress Notes (Signed)
Subjective:    Patient ID: Robin Morgan, female    DOB: 1956-08-07, 60 y.o.   MRN: [id number]  HPI   Robin Morgan is here in follow up of her chronic pain. Her headaches seem to be worsening. She feels that they are happening continuously. They are worst when shes in the car a lot or if she sits too long. She often wakes up with them in the morning.  The pain originiates in her lower neck and radiates into her occiput as well as down her back.   Her last cervical imaging was from 2012: There is degenerative spondylosis at C4-5, C5-6 and C6-7 with disc space narrowing and marginal osteophytes. There is mild facet degeneration in the mid cervical region, most notable at C4-5. ?cervical laminectomy at C5-6?  Robin Morgan remains on her hydrocodone and ms contin for pain control. These meds in addition to her cymbalta, flexeril, gabapentin, seem to control her back and fibro pain.   Pain Inventory Average Pain 8 Pain Right Now 8 My pain is constant and aching  In the last 24 hours, has pain interfered with the following? General activity 4 Relation with others 5 Enjoyment of life 4 What TIME of day is your pain at its worst? evening, night Sleep (in general) Poor  Pain is worse with: sitting and standing Pain improves with: heat/ice and medication Relief from Meds: 7  Mobility walk without assistance ability to climb steps?  yes do you drive?  yes  Function disabled: date disabled NA  Neuro/Psych bowel control problems spasms  Prior Studies Any changes since last visit?  no  Physicians involved in your care Any changes since last visit?  no   Family History  Problem Relation Age of Onset  . Alzheimer's disease Mother   . Diabetes Father    Social History   Social History  . Marital Status: Divorced    Spouse Name: [name]  . Number of Children: 1  . Years of Education: [name]   Social History Main Topics  . Smoking status: Never Smoker   . Smokeless tobacco: None   . Alcohol Use: No  . Drug Use: No  . Sexual Activity: Not Asked   Other Topics Concern  . None   Social History Narrative   Past Surgical History  Procedure Laterality Date  . Abdominal hysterectomy    . Spine surgery    . Cholecystectomy    . Appendectomy    . Tonsillectomy    . Breast surgery      rt breast cyst  . Rotator cuff repair      right   Past Medical History  Diagnosis Date  . Myalgia   . Calcific tendonitis   . Cervical spondylosis without myelopathy   . Depression   . Pain in joint, upper arm   . Diabetes mellitus   . Hypertension   . Hiatal hernia   . Fibromyalgia   . Hyperlipidemia   . GERD (gastroesophageal reflux disease)   . Esophageal stricture    BP 133/72 mmHg  Pulse 98  Resp 15  Opioid Risk Score:   Fall Risk Score:  `1  Depression screen PHQ 2/9  Depression screen [facility] 2/9 10/08/2015 11/21/2014  Decreased Interest 2 2  Down, Depressed, Hopeless 0 0  PHQ - 2 Score 2 2  Altered sleeping - 2  Tired, decreased energy - 2  Change in appetite - 1  Feeling bad or failure about yourself  - 0  Trouble  concentrating - 1  Moving slowly or fidgety/restless - 0  Suicidal thoughts - 0  PHQ-9 Score - 8     Review of Systems  Constitutional:       Bowel control problems   Gastrointestinal: Positive for diarrhea.  Endocrine:       High blood sugar   Neurological:       Spasms   All other systems reviewed and are negative.      Objective:   Physical Exam  Constitutional: She is oriented to person, place, and time. She appears well-developed and well-nourished. She has gained a little weight.  HENT:  Head: Normocephalic and atraumatic.  Eyes: Conjunctivae and EOM are normal. Pupils are equal, round, and reactive to light. Perhaps a little exopthalmos  Neck: Normal range of motion.  Cardiovascular: Normal rate, regular rhythm and normal heart sounds.  Pulmonary/Chest: Breath sounds normal. No respiratory distress. She has no wheezes.   Abdominal: Soft. Bowel sounds are normal. She exhibits no distension.  Musculoskeletal: Left hemipelvis more balanced but still elevated. Right lumbar paraspinals are tender and taut to palpation and reproduced pain with pressure. Focal trigger points in right upper and mid trap (3 in total). Pain worsens with flexion and rotation bilaterally. Neurological: She is alert and oriented to person, place, and time. She has normal strength and normal reflexes. No sensory deficit.  Psychiatric: She has a normal mood and affect but perhaps a little flat.Marland Kitchen Her behavior is normal. Judgment and thought content normal. Mood is pleasant as always    Assessment & Plan:   ASSESSMENT:  1. Fibromyalgia with myofascial pain. --affecting right shoulder girdle today  2. Rotator cuff syndrome on the right.  3. History of repair was with chronic pain.  4. Cervicalgia. Post-laminectomy syndrome, facet arthropathy symptoms, associated myofascial pain and headaches. Symptoms worsening over the last few months 5. Depression- which has improved  7. Mid/low back pain-DDD---xrays of lumbar spine from 2014 show DDD in the upper lumbar segements. Likely myofascial, associated pain on the right side as well.  8. Decreased bone density  10. Trigger finger left middle    PLAN:  1. After informed consent and preparation of the skin with isopropyl alcohol, I injected the right trap  (3x total) each with 2cc of 1% lidocaine. The patient tolerated well, and no complications were experienced. Post-injection instructions were provided. .  2. Hydrocodone 5/325 one-half to one p.o. B.i.d prn. #60---a second rx was provided for next month.  3. Ordered cervical xrays. Encouraged regular heat and ROM exercises  4. Continue low dose ms contin.  5.. Follow up with me in about 2 months. 25 minutes of face to face patient care time were spent during this visit. All questions were encouraged and answered.

## 2015-12-04 NOTE — Patient Instructions (Signed)
  PLEASE CALL ME WITH ANY PROBLEMS OR QUESTIONS (#[phone]).      

## 2015-12-05 ENCOUNTER — Telehealth: Payer: Self-pay | Admitting: Physical Medicine & Rehabilitation

## 2015-12-05 NOTE — Telephone Encounter (Signed)
Robin Morgan xray reveals the following:   Moderate to severe degenerative disc disease at C5-6 and C6-7. C7-T1 not well characterized due to overlying soft tissues   If her neck is not feeling better after the TPI's I would recommend a course of PT,traction for her neck pain and headaches.   Please follow up with her, and if she's not feeling better, we can proceed with PT

## 2015-12-10 LAB — 6-ACETYLMORPHINE,TOXASSURE ADD
6-ACETYLMORPHINE: NEGATIVE
6-ACETYLMORPHINE: NOT DETECTED ng/mg{creat}

## 2015-12-10 LAB — TOXASSURE SELECT,+ANTIDEPR,UR: PDF: 0

## 2015-12-10 NOTE — Telephone Encounter (Signed)
I spoke with Robin Morgan.  She is some better but is feeling like she is going to get a headache now.  I gave her the recommendation by Dr Naaman Plummer.  She said she has had PT before and thinks they tried traction.  I told her to let us know if she wants to try again. She said she will.

## 2015-12-11 NOTE — Progress Notes (Signed)
Urine drug screen for this encounter is consistent for prescribed medication. Morphine is prescribed.

## 2016-01-07 ENCOUNTER — Encounter: Payer: Self-pay | Admitting: Physical Medicine & Rehabilitation

## 2016-01-07 ENCOUNTER — Encounter: Payer: Medicaid Other | Attending: Physical Medicine and Rehabilitation | Admitting: Physical Medicine & Rehabilitation

## 2016-01-07 VITALS — BP 123/48 | HR 86 | Resp 14

## 2016-01-07 DIAGNOSIS — M609 Myositis, unspecified: Secondary | ICD-10-CM | POA: Insufficient documentation

## 2016-01-07 DIAGNOSIS — M5136 Other intervertebral disc degeneration, lumbar region: Secondary | ICD-10-CM

## 2016-01-07 DIAGNOSIS — M791 Myalgia: Secondary | ICD-10-CM | POA: Diagnosis not present

## 2016-01-07 DIAGNOSIS — M961 Postlaminectomy syndrome, not elsewhere classified: Secondary | ICD-10-CM | POA: Diagnosis not present

## 2016-01-07 DIAGNOSIS — Z5181 Encounter for therapeutic drug level monitoring: Secondary | ICD-10-CM | POA: Diagnosis not present

## 2016-01-07 DIAGNOSIS — IMO0001 Reserved for inherently not codable concepts without codable children: Secondary | ICD-10-CM

## 2016-01-07 DIAGNOSIS — Z79899 Other long term (current) drug therapy: Secondary | ICD-10-CM

## 2016-01-07 MED ORDER — HYDROCODONE-ACETAMINOPHEN 5-325 MG PO TABS: 1.0000 | ORAL_TABLET | Freq: Four times a day (QID) | ORAL | Status: DC | PRN

## 2016-01-07 MED ORDER — MORPHINE SULFATE ER 15 MG PO TBCR: 15.0000 mg | EXTENDED_RELEASE_TABLET | Freq: Two times a day (BID) | ORAL | Status: DC

## 2016-01-07 NOTE — Progress Notes (Signed)
Subjective:    Patient ID: Robin Morgan, female    DOB: 29-Nov-1955, 60 y.o.   MRN: [id number]  HPI   Robin Morgan continues to have pain in her neck, shoulders associated with headaches. I performed TPI's at last visit which only provided temporary relief (hours). The xrays of her cervical spine revealed:  Normal anterior-posterior alignment. Moderate C4-5 degenerative disc disease. Moderate to severe degenerative disc disease at C5-6 and C6-7. C7-T1 not well characterized due to overlying soft tissues. There is mild left C4-5 and moderate left C5-6 foraminal narrowing. There is minimal right C4-5 and moderate right C5-6 foraminal narrowing. There is at least mild right C6-7 foraminal narrowing.  She continues to work on regular posture and stretches. She is reluctant to pursue PT at this point. She remains on her hydrocodone and ms contin as prescribed.     Pain Inventory Average Pain 7 Pain Right Now 7 My pain is constant, aching and cramps, spasms  In the last 24 hours, has pain interfered with the following? General activity 8 Relation with others 8 Enjoyment of life 8 What TIME of day is your pain at its worst? evening, night  Sleep (in general) Poor  Pain is worse with: sitting and standing Pain improves with: heat/ice and medication Relief from Meds: 7  Mobility walk without assistance ability to climb steps?  yes do you drive?  yes Do you have any goals in this area?  no  Function disabled: date disabled .  Neuro/Psych bladder control problems spasms  Prior Studies Any changes since last visit?  no  Physicians involved in your care Any changes since last visit?  no   Family History  Problem Relation Age of Onset  . Alzheimer's disease Mother   . Diabetes Father    Social History   Social History  . Marital Status: Divorced    Spouse Name: [name]  . Number of Children: 1  . Years of Education: [name]   Social History Main Topics  . Smoking  status: Never Smoker   . Smokeless tobacco: None  . Alcohol Use: No  . Drug Use: No  . Sexual Activity: Not Asked   Other Topics Concern  . None   Social History Narrative   Past Surgical History  Procedure Laterality Date  . Abdominal hysterectomy    . Spine surgery    . Cholecystectomy    . Appendectomy    . Tonsillectomy    . Breast surgery      rt breast cyst  . Rotator cuff repair      right   Past Medical History  Diagnosis Date  . Myalgia   . Calcific tendonitis   . Cervical spondylosis without myelopathy   . Depression   . Pain in joint, upper arm   . Diabetes mellitus   . Hypertension   . Hiatal hernia   . Fibromyalgia   . Hyperlipidemia   . GERD (gastroesophageal reflux disease)   . Esophageal stricture    BP 123/48 mmHg  Pulse 86  Resp 14  SpO2 95%  Opioid Risk Score:   Fall Risk Score:  `1  Depression screen PHQ 2/9  Depression screen [facility] 2/9 10/08/2015 11/21/2014  Decreased Interest 2 2  Down, Depressed, Hopeless 0 0  PHQ - 2 Score 2 2  Altered sleeping - 2  Tired, decreased energy - 2  Change in appetite - 1  Feeling bad or failure about yourself  - 0  Trouble concentrating -  1  Moving slowly or fidgety/restless - 0  Suicidal thoughts - 0  PHQ-9 Score - 8     Review of Systems  Respiratory: Positive for shortness of breath.   Gastrointestinal: Positive for diarrhea.  Endocrine:       High/low blood sugar  All other systems reviewed and are negative.      Objective:   Physical Exam HENT:  Head: Normocephalic and atraumatic.  Eyes: Conjunctivae and EOM are normal. Pupils are equal, round, and reactive to light. Perhaps a little exopthalmos  Neck: Normal range of motion.  Cardiovascular: Normal rate, regular rhythm and normal heart sounds.  Pulmonary/Chest: Breath sounds normal. No respiratory distress. She has no wheezes.  Abdominal: Soft. Bowel sounds are normal. She exhibits no distension.  Musculoskeletal:Right lumbar  paraspinals are tender and taut to palpation and reproduced pain with pressure.   Neurological: She is alert and oriented to person, place, and time. She has normal strength and normal reflexes. No sensory deficits seen in either upper or lower exts. Neck is painful to palpation and with flexion more than extension. Both traps remain tight.  Psychiatric: She has a normal mood and affect.Marland Kitchen Her behavior is normal. Judgment and thought content normal. Mood is pleasant as always    Assessment & Plan:   ASSESSMENT:  1. Fibromyalgia with myofascial pain. 2. Rotator cuff syndrome on the right.  3. History of repair was with chronic pain.  4. Cervicalgia. Post-laminectomy syndrome, facet arthropathy symptoms, associated myofascial pain and headaches. Symptoms continue to be worse  over the last few months and plain films are concerning.  5. Depression- which has improved  7. Mid/low back pain-DDD---xrays of lumbar spine from 2014 show DDD in the upper lumbar segements. Likely myofascial, associated pain on the right side as well.  8. Decreased bone density  10. Trigger finger left middle   PLAN:  1. Will pursue an MRI of the cervical spine to assess her lower segments in more detail. 2. Hydrocodone 5/325 one-half to one p.o. B.i.d prn. #60---a second rx was provided for next month.  3. Continue to focus on good posture and ROM exercises  4. Continue low dose ms contin.  5.. Follow up with me in about 57month depending upon results of MRI. 15 minutes of face to face patient care time were spent during this visit. All questions were encouraged and answered.

## 2016-01-07 NOTE — Patient Instructions (Signed)
  PLEASE CALL ME WITH ANY PROBLEMS OR QUESTIONS (#[phone]).      

## 2016-01-15 ENCOUNTER — Other Ambulatory Visit: Payer: Medicaid Other

## 2016-01-18 ENCOUNTER — Other Ambulatory Visit: Payer: Self-pay | Admitting: Physical Medicine & Rehabilitation

## 2016-01-22 ENCOUNTER — Ambulatory Visit
Admission: RE | Admit: 2016-01-22 | Discharge: 2016-01-22 | Disposition: A | Discharge status: Home / Self Care | Payer: Medicaid Other | Source: Ambulatory Visit | Attending: Physical Medicine & Rehabilitation | Admitting: Physical Medicine & Rehabilitation

## 2016-01-22 DIAGNOSIS — M961 Postlaminectomy syndrome, not elsewhere classified: Secondary | ICD-10-CM

## 2016-01-22 DIAGNOSIS — M5136 Other intervertebral disc degeneration, lumbar region: Secondary | ICD-10-CM

## 2016-01-24 ENCOUNTER — Telehealth: Payer: Self-pay | Admitting: Physical Medicine & Rehabilitation

## 2016-01-24 DIAGNOSIS — M5010 Cervical disc disorder with radiculopathy, unspecified cervical region: Secondary | ICD-10-CM

## 2016-01-24 NOTE — Telephone Encounter (Signed)
I reviewed Pat's cervical MRI:  C4-5: Spondylosis with endplate osteophytes and bulging of the disc. Narrowing of the ventral subarachnoid space but no cord compression. Facet degeneration on the right. Mild foraminal narrowing on the right because of osteophytic encroachment.  C5-6: Spondylosis with endplate osteophytes and bulging of the disc. Narrowing of the ventral subarachnoid space no compression of the cord. No facet arthropathy. Foraminal narrowing bilaterally because of osteophytic encroachment.  C6-7: Spondylosis with endplate osteophytes and bulging of the disc. No compressive canal or foraminal narrowing. No facet arthropathy.  I called her to discuss but there was no answer, and her mailbox is full. We could pursue a C7-1 ESI at [place] which may help relieve some of her neck pain. If she would like to discuss further, I would be happy to.    C7-T1: Mild facet degeneration with 1 mm of anterolisthesis. Mild bulging of the disc. No compressive narrowing of the canal for hip.

## 2016-01-27 ENCOUNTER — Institutional Professional Consult (permissible substitution): Payer: Medicaid Other | Admitting: Neurology

## 2016-01-27 NOTE — Telephone Encounter (Signed)
Made pt aware that referral was entered and [facility] will be in touch.

## 2016-01-27 NOTE — Telephone Encounter (Signed)
Spoke with pt. Pt is interesting in receiving the C7-T1 ESI. Pt is aware.

## 2016-01-31 ENCOUNTER — Other Ambulatory Visit: Payer: Self-pay | Admitting: Physical Medicine & Rehabilitation

## 2016-01-31 DIAGNOSIS — M5010 Cervical disc disorder with radiculopathy, unspecified cervical region: Secondary | ICD-10-CM

## 2016-02-03 ENCOUNTER — Other Ambulatory Visit: Payer: Self-pay | Admitting: Physical Medicine & Rehabilitation

## 2016-02-03 NOTE — Telephone Encounter (Signed)
Spoke with Robin Morgan she has been taking her gabapentin as prescribed for her Fibromyalgia. Gabapentin prescription re-filled. She verbalizes understanding.

## 2016-02-06 ENCOUNTER — Telehealth: Payer: Self-pay

## 2016-02-06 DIAGNOSIS — Z79899 Other long term (current) drug therapy: Secondary | ICD-10-CM

## 2016-02-06 DIAGNOSIS — Z5181 Encounter for therapeutic drug level monitoring: Secondary | ICD-10-CM

## 2016-02-06 DIAGNOSIS — IMO0001 Reserved for inherently not codable concepts without codable children: Secondary | ICD-10-CM

## 2016-02-06 NOTE — Telephone Encounter (Signed)
Pt states that she needs a refill on her morphine. On the last office visit note, it was not documented that a second rx was printed. Please advise on refill.

## 2016-02-06 NOTE — Telephone Encounter (Signed)
Can you please refill on behalf of [facility]?

## 2016-02-06 NOTE — Telephone Encounter (Signed)
May have second rx

## 2016-02-07 MED ORDER — MORPHINE SULFATE ER 15 MG PO TBCR: 15.0000 mg | EXTENDED_RELEASE_TABLET | Freq: Two times a day (BID) | ORAL | Status: DC

## 2016-02-07 NOTE — Telephone Encounter (Signed)
According to [facility] [name] picked up her MS Contin prescription on 01/07/2016. She has a follow up appointment on 03/09/16.  MS Contin prescription printed. Placed a call to Ms. Rengifo no answer and her voicemail is full. Will have office staff call her again today.

## 2016-02-07 NOTE — Telephone Encounter (Signed)
Tried calling pt, mailbox is full °

## 2016-02-10 NOTE — Telephone Encounter (Signed)
Pt is aware of rx up front.

## 2016-02-12 ENCOUNTER — Other Ambulatory Visit: Payer: Self-pay | Admitting: Physical Medicine & Rehabilitation

## 2016-02-12 ENCOUNTER — Ambulatory Visit
Admission: RE | Admit: 2016-02-12 | Discharge: 2016-02-12 | Disposition: A | Discharge status: Home / Self Care | Payer: Medicaid Other | Source: Ambulatory Visit | Attending: Physical Medicine & Rehabilitation | Admitting: Physical Medicine & Rehabilitation

## 2016-02-12 DIAGNOSIS — M5010 Cervical disc disorder with radiculopathy, unspecified cervical region: Secondary | ICD-10-CM

## 2016-02-12 DIAGNOSIS — M797 Fibromyalgia: Secondary | ICD-10-CM | POA: Insufficient documentation

## 2016-02-12 MED ORDER — IOPAMIDOL (ISOVUE-M 300) INJECTION 61%
1.0000 mL | Freq: Once | INTRAMUSCULAR | Status: AC | PRN
  Administered 2016-02-12: 1 mL via EPIDURAL

## 2016-02-12 MED ORDER — TRIAMCINOLONE ACETONIDE 40 MG/ML IJ SUSP (RADIOLOGY)
60.0000 mg | Freq: Once | INTRAMUSCULAR | Status: AC
  Administered 2016-02-12: 60 mg via EPIDURAL

## 2016-02-12 NOTE — Discharge Instructions (Signed)

## 2016-02-17 ENCOUNTER — Ambulatory Visit (INDEPENDENT_AMBULATORY_CARE_PROVIDER_SITE_OTHER): Payer: Medicaid Other | Admitting: Neurology

## 2016-02-17 ENCOUNTER — Encounter: Payer: Self-pay | Admitting: Neurology

## 2016-02-17 VITALS — BP 122/60 | HR 92 | Resp 20 | Ht 61.0 in | Wt 143.0 lb

## 2016-02-17 DIAGNOSIS — G4700 Insomnia, unspecified: Secondary | ICD-10-CM | POA: Diagnosis not present

## 2016-02-17 DIAGNOSIS — R0683 Snoring: Secondary | ICD-10-CM

## 2016-02-17 DIAGNOSIS — G4730 Sleep apnea, unspecified: Secondary | ICD-10-CM

## 2016-02-17 NOTE — Progress Notes (Signed)
SLEEP MEDICINE CLINIC   Provider:  Larey Seat, M D  Referring Provider: Leamon Arnt, MD Primary Care Physician:  Leamon Arnt, MD  Chief Complaint  Patient presents with  . New Patient (Initial Visit)    snores at night, never had sleep study    HPI:  Robin Morgan is a 60 y.o. female , seen here as a referral from Dr. Jonni Sanger for A sleep evaluation. Robin Morgan is further followed by Dr. Naaman Plummer as her pain management doctor, suffers from fibromyalgia. She reportedly has rarely a very good day but her sleep evaluation is necessary to distinguish if other organic factors may affect her sleep aside from her painful condition. She has chronic insomnia and has been treated with Ambien, prescribed by Dr. Jonni Sanger. An organic cause for the insomnia was never found. In her past medical history it is calcified as insomnia secondary to medical condition, chronic pain.  Chief complaint according to patient : Her grandson has witnessed her to snore and to stop breathing in her sleep.  Sleep habits are as follows: It'll take her Ambien usually at around 8 PM and some evening she will be asleep within 30 minutes and other evening's 120 minutes later. She usually can sleep through for about 4 hours sometimes she can sleep through the night, 7 or 8 hours. She will have some nights one bathroom break only around 2 AM. She will sleep on one pillow for head support 1 resting beneath her knees and always on her side. She lives alone. Her bedroom is described as cool, quiet and dark. She uses her smart phone alarm to wake up in the morning the first one rings at 7:30 AM the second at 8:30. Usually when she wakes up she feels neither restored or refreshed. She is a very dry mouth which can be medication related and also can be provoked by snoring. Sometimes she wakes up with cephalalgia for cervicalgia.  Sleep medical history and family sleep history: Her sister has obstructive sleep apnea, and a first  cousin maternal side. She underwent tonsillectomy in childhood, no traumatic head or neck injuries, no ENT surgeries otherwise. She was a sleep walker before school age.  Social history: She has no history of shift work or swing shift work. She is on disability. She used to work in a physically active capacity. No history of tobacco use, alcohol use, she does not use caffeine aged beverages either as these affect her fibromyalgia negatively.  Review of Systems: Out of a complete 14 system review, the patient complains of only the following symptoms, and all other reviewed systems are negative. Snoring, apnea witnessed by grandson, 29 years old.   Epworth score 1, Fatigue severity score 46  , depression score 7 / 15   Social History   Social History  . Marital Status: Divorced    Spouse Name: [name]  . Number of Children: 1  . Years of Education: [name]   Occupational History  . Not on file.   Social History Main Topics  . Smoking status: Never Smoker   . Smokeless tobacco: Not on file  . Alcohol Use: No  . Drug Use: No  . Sexual Activity: Not on file   Other Topics Concern  . Not on file   Social History Narrative    Family History  Problem Relation Age of Onset  . Alzheimer's disease Mother   . Diabetes Father   . Heart disease Maternal Grandmother  Past Medical History  Diagnosis Date  . Myalgia   . Calcific tendonitis   . Cervical spondylosis without myelopathy   . Depression   . Pain in joint, upper arm   . Diabetes mellitus   . Hypertension   . Hiatal hernia   . Fibromyalgia   . Hyperlipidemia   . GERD (gastroesophageal reflux disease)   . Esophageal stricture     Past Surgical History  Procedure Laterality Date  . Abdominal hysterectomy    . Spine surgery    . Cholecystectomy    . Appendectomy    . Tonsillectomy    . Breast surgery      rt breast cyst  . Rotator cuff repair      right    Current Outpatient Prescriptions  Medication Sig  Dispense Refill  . amitriptyline (ELAVIL) 25 MG tablet TAKE ONE TABLET (25 MG TOTAL) BY MOUTH AT BEDTIME.  3  . atorvastatin (LIPITOR) 40 MG tablet Take 40 mg by mouth daily.  3  . b complex vitamins tablet Take 1 tablet by mouth daily.    . Calcium Carbonate-Vitamin D (CALCIUM-VITAMIN D) 500-200 MG-UNIT per tablet Take 1 tablet by mouth 2 (two) times daily with a meal.    . co-enzyme Q-10 30 MG capsule Take 30 mg by mouth 3 (three) times daily.    . cyclobenzaprine (FLEXERIL) 10 MG tablet TAKE 1 TABLET (10 MG TOTAL) BY MOUTH EVERY 8 (EIGHT) HOURS AS NEEDED. 60 tablet 0  . diclofenac sodium (VOLTAREN) 1 % GEL Apply 1 application topically 3 (three) times daily. 3 Tube 3  . dicyclomine (BENTYL) 10 MG capsule Take 1 capsule by mouth.  2  . docusate calcium (SURFAK) 240 MG capsule Take 1 capsule by mouth daily.    . DULoxetine (CYMBALTA) 60 MG capsule Take 60 mg by mouth daily.    . fenofibrate 160 MG tablet Take 160 mg by mouth daily.    Marland Kitchen gabapentin (NEURONTIN) 300 MG capsule TAKE 1 CAPSULE BY MOUTH 3 TIMES DAILY. 90 capsule 3  . glipiZIDE (GLUCOTROL XL) 5 MG 24 hr tablet Take 5 mg by mouth every morning.  2  . HYDROcodone-acetaminophen (NORCO/VICODIN) 5-325 MG tablet Take 1 tablet by mouth every 6 (six) hours as needed. 60 tablet 0  . insulin glargine (LANTUS) 100 UNIT/ML injection Inject 52 Units into the skin at bedtime.     Marland Kitchen levothyroxine (SYNTHROID, LEVOTHROID) 50 MCG tablet Take 50 mcg by mouth daily.    Marland Kitchen lisinopril (PRINIVIL,ZESTRIL) 10 MG tablet Take 10 mg by mouth daily.    . magnesium gluconate (MAGONATE) 500 MG tablet Take 500 mg by mouth daily.    . meloxicam (MOBIC) 7.5 MG tablet TAKE 1 TABLET (7.5 MG TOTAL) BY MOUTH DAILY. 30 tablet 4  . metFORMIN (GLUCOPHAGE) 1000 MG tablet Take 1 tablet by mouth 2 (two) times daily.  3  . morphine (MS CONTIN) 15 MG 12 hr tablet Take 1 tablet (15 mg total) by mouth every 12 (twelve) hours. 60 tablet 0  . NEXIUM 20 MG capsule Take 1 capsule by  mouth at bedtime.  1  . zolpidem (AMBIEN) 10 MG tablet Take 1 tablet (10 mg total) by mouth at bedtime as needed. 30 tablet 0   No current facility-administered medications for this visit.    Allergies as of 02/17/2016 - Review Complete 02/17/2016  Allergen Reaction Noted  . Compazine Other (See Comments) 08/25/2011  . Naproxen Itching 08/25/2011    Vitals: BP 122/60 mmHg  Pulse 92  Resp 20  Ht 5\' 1"  (1.549 m)  Wt 143 lb (64.864 kg)  BMI 27.03 kg/m2 Last Weight:  Wt Readings from Last 1 Encounters:  02/17/16 143 lb (64.864 kg)   [id number] mass index is 27.03 kg/(m^2).     Last Height:   Ht Readings from Last 1 Encounters:  02/17/16 5\' 1"  (1.549 m)    Physical exam:  General: The patient is awake, alert and appears not in acute distress. The patient is well groomed. Head: Normocephalic, atraumatic. Neck is supple. Mallampati 4,  neck circumference: 15.5    . Nasal airflow patent . Retrognathia is seen.  Cardiovascular:  Regular rate and rhythm, without  murmurs or carotid bruit, and without distended neck veins. Respiratory: Lungs are clear to auscultation. Skin:  Without evidence of edema, or rash Trunk: BMI is elevated - The patient's posture is hunched.   Neurologic exam : The patient is awake and alert, oriented to place and time.   Memory subjective described as intact. Attention span & concentration ability appears normal.  Speech is fluent,  without dysarthria, dysphonia or aphasia.  Mood and affect are appropriate.  Cranial nerves: Pupils are equal and briskly reactive to light. Funduscopic exam without  evidence of pallor or edema.  Extraocular movements  in vertical and horizontal planes intact and without nystagmus. Visual fields by finger perimetry are intact. Hearing to finger rub intact. Facial sensation intact to fine touch.Facial motor strength is symmetric and tongue and uvula move midline. Shoulder shrug was symmetrical.   Motor exam:  Normal tone,  muscle bulk and symmetric strength in all extremities. Sensory:  Fine touch, pinprick and vibration were normal.   The patient was advised of the nature of the diagnosed sleep disorder , the treatment options and risks for general a health and wellness arising from not treating the condition.  I spent more than 45 minutes of face to face time with the patient. Greater than 50% of time was spent in counseling and coordination of care. We have discussed the diagnosis and differential and I answered the patient's questions.     Assessment:  After physical and neurologic examination, review of laboratory studies,  Personal review of imaging studies, reports of other /same  Imaging studies ,  Results of polysomnography/ neurophysiology testing and pre-existing records as far as provided in visit., my assessment is   1) Robin Morgan very likely has sleep apnea, and this may contribute to some of her daytime fatigue and sleepiness. Fibromyalgia may alter her sleep pattern to but is usually characterized a spontaneous arousals. I would like to obtain a split-night polysomnography for this patient. Her headaches seem to be related to muscle spasms and not to CO2 retention and for this reason I will not order capnography.  Plan:  Treatment plan and additional workup :  SPLIT night PSG, no CO2. Rv after sleep test.    Asencion Partridge Manny Vitolo MD  02/17/2016   CC: Leamon Arnt, Md [address] [place], [place] [address]

## 2016-02-17 NOTE — Patient Instructions (Signed)

## 2016-02-20 ENCOUNTER — Other Ambulatory Visit: Payer: Self-pay | Admitting: Physical Medicine & Rehabilitation

## 2016-02-26 ENCOUNTER — Ambulatory Visit (INDEPENDENT_AMBULATORY_CARE_PROVIDER_SITE_OTHER): Payer: Medicaid Other | Admitting: Neurology

## 2016-02-26 DIAGNOSIS — R0683 Snoring: Secondary | ICD-10-CM

## 2016-02-26 DIAGNOSIS — G4700 Insomnia, unspecified: Secondary | ICD-10-CM

## 2016-02-26 DIAGNOSIS — G4730 Sleep apnea, unspecified: Secondary | ICD-10-CM | POA: Diagnosis not present

## 2016-03-02 ENCOUNTER — Telehealth: Payer: Self-pay | Admitting: Physical Medicine & Rehabilitation

## 2016-03-02 NOTE — Telephone Encounter (Signed)
Is letter appropriate for pt? Please advise.

## 2016-03-02 NOTE — Telephone Encounter (Signed)
She may have a note. That's fine.  thanks

## 2016-03-02 NOTE — Telephone Encounter (Signed)
Patient is needing a note for jury duty about her condition and medications.  Please call patient with any questions [phone].

## 2016-03-03 ENCOUNTER — Encounter: Payer: Self-pay | Admitting: Physical Medicine & Rehabilitation

## 2016-03-09 ENCOUNTER — Encounter: Payer: Medicaid Other | Attending: Physical Medicine and Rehabilitation | Admitting: Physical Medicine & Rehabilitation

## 2016-03-09 ENCOUNTER — Encounter: Payer: Self-pay | Admitting: Physical Medicine & Rehabilitation

## 2016-03-09 VITALS — BP 112/55 | HR 90

## 2016-03-09 DIAGNOSIS — M797 Fibromyalgia: Secondary | ICD-10-CM | POA: Diagnosis not present

## 2016-03-09 DIAGNOSIS — Z79899 Other long term (current) drug therapy: Secondary | ICD-10-CM

## 2016-03-09 DIAGNOSIS — M961 Postlaminectomy syndrome, not elsewhere classified: Secondary | ICD-10-CM

## 2016-03-09 DIAGNOSIS — M791 Myalgia: Secondary | ICD-10-CM | POA: Diagnosis not present

## 2016-03-09 DIAGNOSIS — M609 Myositis, unspecified: Secondary | ICD-10-CM

## 2016-03-09 DIAGNOSIS — M51369 Other intervertebral disc degeneration, lumbar region without mention of lumbar back pain or lower extremity pain: Secondary | ICD-10-CM

## 2016-03-09 DIAGNOSIS — M5136 Other intervertebral disc degeneration, lumbar region: Secondary | ICD-10-CM

## 2016-03-09 DIAGNOSIS — Z5181 Encounter for therapeutic drug level monitoring: Secondary | ICD-10-CM

## 2016-03-09 DIAGNOSIS — IMO0001 Reserved for inherently not codable concepts without codable children: Secondary | ICD-10-CM

## 2016-03-09 MED ORDER — MORPHINE SULFATE ER 15 MG PO TBCR: 15.0000 mg | EXTENDED_RELEASE_TABLET | Freq: Two times a day (BID) | ORAL | 0 refills | Status: DC

## 2016-03-09 MED ORDER — HYDROCODONE-ACETAMINOPHEN 5-325 MG PO TABS: 1.0000 | ORAL_TABLET | Freq: Four times a day (QID) | ORAL | 0 refills | Status: DC | PRN

## 2016-03-09 NOTE — Progress Notes (Signed)
Subjective:    Patient ID: Robin Morgan, female    DOB: 1956-03-31, 60 y.o.   MRN: [id number]  HPI   [name] is back regarding her chronic pain. She had her ESI at right C7-T1 on July 5 with good results.   She has had no further headaches on the right side any longer. Her neck pain is improved as well. She is still having discomfort in the right trap with spasm. . She reports a left sided headache over the weekend. She continues with her neck ROM exercises and strengthening. She uses heat with some results.   SHe is also complaining about increasing low back pain while she is standing and walking. She struggles to vacuum or perform tasks around the house which require prolonged walking and standing. I reviewed her lumbar films from 2014 which show DDD but also a fairly large syndesmophyte at L2-3. She tells me that Dr. Joanna Puff at some point told her she had AS based on lab work which he performed. She states that this was "years ago however."  [name] remains on her medications as prescribed which still give her some relief. She is compliant with our program.      Pain Inventory Average Pain 7 Pain Right Now 7 My pain is stabbing and aching  In the last 24 hours, has pain interfered with the following? General activity 7 Relation with others 6 Enjoyment of life 7 What TIME of day is your pain at its worst? morning, evening Sleep (in general) Poor  Pain is worse with: standing Pain improves with: rest and medication Relief from Meds: 9  Mobility walk without assistance ability to climb steps?  yes do you drive?  yes  Function disabled: date disabled . Do you have any goals in this area?  no  Neuro/Psych spasms  Prior Studies Any changes since last visit?  no  Physicians involved in your care Any changes since last visit?  no   Family History  Problem Relation Age of Onset  . Alzheimer's disease Mother   . Diabetes Father   . Heart disease Maternal Grandmother     Social History   Social History  . Marital status: Divorced    Spouse name: [name]  . Number of children: 1  . Years of education: [name]   Social History Main Topics  . Smoking status: Never Smoker  . Smokeless tobacco: Never Used  . Alcohol use No  . Drug use: No  . Sexual activity: Not Asked   Other Topics Concern  . None   Social History Narrative  . None   Past Surgical History:  Procedure Laterality Date  . ABDOMINAL HYSTERECTOMY    . APPENDECTOMY    . BREAST SURGERY     rt breast cyst  . CHOLECYSTECTOMY    . ROTATOR CUFF REPAIR     right  . SPINE SURGERY    . TONSILLECTOMY     Past Medical History:  Diagnosis Date  . Calcific tendonitis   . Cervical spondylosis without myelopathy   . Depression   . Diabetes mellitus   . Esophageal stricture   . Fibromyalgia   . GERD (gastroesophageal reflux disease)   . Hiatal hernia   . Hyperlipidemia   . Hypertension   . Myalgia   . Pain in joint, upper arm    BP (!) 112/55 (BP Location: Left Arm, Patient Position: Sitting, Cuff Size: Normal)   Pulse 90   SpO2 93%   Opioid Risk  Score:   Fall Risk Score:  `1  Depression screen PHQ 2/9  Depression screen [facility] 2/9 10/08/2015 11/21/2014  Decreased Interest 2 2  Down, Depressed, Hopeless 0 0  PHQ - 2 Score 2 2  Altered sleeping - 2  Tired, decreased energy - 2  Change in appetite - 1  Feeling bad or failure about yourself  - 0  Trouble concentrating - 1  Moving slowly or fidgety/restless - 0  Suicidal thoughts - 0  PHQ-9 Score - 8    Review of Systems  Constitutional: Positive for diaphoresis.  HENT: Negative.   Eyes: Negative.   Respiratory: Positive for shortness of breath.   Cardiovascular: Negative.   Gastrointestinal: Positive for constipation.  Endocrine:       High blood sugar  Genitourinary: Positive for difficulty urinating.  Musculoskeletal:       Spasms  Allergic/Immunologic: Negative.   Neurological: Negative.   Hematological: Negative.    Psychiatric/Behavioral: Negative.        Objective:   Physical Exam  Constitutional: She is oriented to person, place, and time. She appears well-developed and well-nourished. She has gained a little weight.  HENT:  Head: Normocephalic and atraumatic.  Eyes: Conjunctivae and EOM are normal. Pupils are equal, round, and reactive to light. Perhaps a little exopthalmos  Neck: Normal range of motion.  Cardiovascular: Normal rate, regular rhythm and normal heart sounds.  Pulmonary/Chest: Breath sounds normal. No respiratory distress. She has no wheezes.  Abdominal: Soft. Bowel sounds are normal. She exhibits no distension.  Musculoskeletal: continued mid lumbar discomfort. Pain with flexion and extension today. Spasms and focal trigger points in right upper and mid trap (3 in total) upon palpation. Neck rom appears improved in general however . Neurological: She is alert and oriented to person, place, and time. She has normal strength and normal reflexes. No sensory deficit.  Psychiatric: She has a normal mood and affect but perhaps a little flat.Marland Kitchen Her behavior is normal. Judgment and thought content normal. Mood is pleasant as always    Assessment & Plan:   ASSESSMENT:  1. Fibromyalgia with myofascial pain. --affecting right shoulder girdle today  2. Rotator cuff syndrome on the right.  3. History of repair was with chronic pain.  4. Cervicalgia. Post-laminectomy syndrome, facet arthropathy symptoms, associated myofascial pain and headaches. Symptoms improved after translaminar C7-T1 ESI (right). Persistent myofascial pain 5. Depression- which has improved  7. Mid/low back pain--progressing. xrays of lumbar spine from 2014 show DDD in the upper lumbar segements. Anterior sendysmosi at L2-3--- AS? 8. Decreased bone density  10. Trigger finger left middle    PLAN:  1. Continue with HEP. Focusing on ROM/massage/stretching to right trap. Elected not to perform TPI today given lack of  effect previously.  2. Hydrocodone 5/325 one-half to one p.o. B.i.d prn. #60-- rf today 3. Repeat XR of lumbar spine to assess for progression. Provided her some basic information regarding AS. Discussed the importance of lumbar strengthening and ROM.  4. Continue low dose ms contin. Rf today 5.. Follow up with me in about 1 months. 25 minutes of face to face patient care time were spent during this visit. All questions were encouraged and answered.

## 2016-03-09 NOTE — Patient Instructions (Signed)
PLEASE CALL ME WITH ANY PROBLEMS OR QUESTIONS ([phone])  

## 2016-03-10 ENCOUNTER — Ambulatory Visit
Admission: RE | Admit: 2016-03-10 | Discharge: 2016-03-10 | Disposition: A | Discharge status: Home / Self Care | Payer: Medicaid Other | Source: Ambulatory Visit | Attending: Physical Medicine & Rehabilitation | Admitting: Physical Medicine & Rehabilitation

## 2016-03-10 ENCOUNTER — Telehealth: Payer: Self-pay

## 2016-03-10 DIAGNOSIS — G4733 Obstructive sleep apnea (adult) (pediatric): Secondary | ICD-10-CM

## 2016-03-10 DIAGNOSIS — M5136 Other intervertebral disc degeneration, lumbar region: Secondary | ICD-10-CM

## 2016-03-10 NOTE — Telephone Encounter (Signed)
I spoke to pt and advised her that her sleep study revealed REM dependent osa and treatment is advised. PAP therapy is a valid option even though her AHI was low because it will treat her hypoxemia and REM dependent apnea. Alternative therapies such as oral appliance or ENT procedures will treat primarily snoring. Dr. Brett Fairy recommends coming back for a cpap titration and our sleep lab can work with pt to do a pap nap if needed. I advised pt thather study did not reveal significant sleep apnea resulting in significant sleep disruption. There was no fragmentation of sleep, but she did have a delayed sleep onset. Pt should consider a dedicated sleep psychology referral for sleep initiation if this is a clinical concern. Pt is agreeable to a cpap titration study. She knows that our sleep lab will call her to schedule. Pt verbalized understanding of results. Pt had no questions at this time but was encouraged to call back if questions arise. Copy of sleep study faxed to Dr. Jonni Sanger.

## 2016-03-11 ENCOUNTER — Telehealth: Payer: Self-pay | Admitting: Physical Medicine & Rehabilitation

## 2016-03-11 NOTE — Telephone Encounter (Signed)
Please let Robin Morgan know that I have reviewed her xrays. Really not a lot of progression.   Stable pattern of degenerative disease with bulky ventral spurring at L1-2 and L2-3. Mild height loss of discs from L1-L4. Mild L4-5 anterolisthesis is attributed to facet arthropathy. No endplate erosion, fracture deformity, or evidence of focal bone lesion.   Would continue with HEP for now focusing on core core strength and lengthening as well as improving lumbar extension and flexion.

## 2016-03-12 NOTE — Telephone Encounter (Signed)
Notified Laylanie

## 2016-03-17 ENCOUNTER — Ambulatory Visit (INDEPENDENT_AMBULATORY_CARE_PROVIDER_SITE_OTHER): Payer: Medicaid Other | Admitting: Neurology

## 2016-03-17 DIAGNOSIS — G4733 Obstructive sleep apnea (adult) (pediatric): Secondary | ICD-10-CM | POA: Diagnosis not present

## 2016-03-31 ENCOUNTER — Telehealth: Payer: Self-pay

## 2016-03-31 DIAGNOSIS — G4733 Obstructive sleep apnea (adult) (pediatric): Secondary | ICD-10-CM

## 2016-03-31 NOTE — Telephone Encounter (Signed)
I spoke to pt. I advised her that cpap appeared to be effective during her study and that Dr. Brett Fairy recommends starting a cpap. Pt is agreeable and so I advised her that the order for a cpap will be sent to [facility] and they will call her to get her cpap set up. Pt verbalized understanding of results. A follow up appt was made with pt for 11/6 at 3:30. Pt verbalized understanding.  Pt had no questions at this time but was encouraged to call back if questions arise.

## 2016-04-06 ENCOUNTER — Other Ambulatory Visit: Payer: Self-pay | Admitting: *Deleted

## 2016-04-06 MED ORDER — CYCLOBENZAPRINE HCL 10 MG PO TABS: ORAL_TABLET | ORAL | 0 refills | Status: DC

## 2016-04-08 ENCOUNTER — Encounter: Payer: Self-pay | Admitting: Physical Medicine & Rehabilitation

## 2016-04-08 ENCOUNTER — Telehealth: Payer: Self-pay | Admitting: *Deleted

## 2016-04-08 ENCOUNTER — Encounter: Payer: Medicaid Other | Attending: Physical Medicine and Rehabilitation | Admitting: Physical Medicine & Rehabilitation

## 2016-04-08 VITALS — BP 148/78 | HR 94 | Resp 16

## 2016-04-08 DIAGNOSIS — Z5181 Encounter for therapeutic drug level monitoring: Secondary | ICD-10-CM | POA: Diagnosis not present

## 2016-04-08 DIAGNOSIS — M609 Myositis, unspecified: Secondary | ICD-10-CM

## 2016-04-08 DIAGNOSIS — M961 Postlaminectomy syndrome, not elsewhere classified: Secondary | ICD-10-CM | POA: Diagnosis not present

## 2016-04-08 DIAGNOSIS — M4716 Other spondylosis with myelopathy, lumbar region: Secondary | ICD-10-CM | POA: Diagnosis not present

## 2016-04-08 DIAGNOSIS — G894 Chronic pain syndrome: Secondary | ICD-10-CM | POA: Diagnosis not present

## 2016-04-08 DIAGNOSIS — M791 Myalgia: Secondary | ICD-10-CM | POA: Diagnosis not present

## 2016-04-08 DIAGNOSIS — IMO0001 Reserved for inherently not codable concepts without codable children: Secondary | ICD-10-CM

## 2016-04-08 DIAGNOSIS — Z79899 Other long term (current) drug therapy: Secondary | ICD-10-CM

## 2016-04-08 MED ORDER — MORPHINE SULFATE ER 15 MG PO TBCR: 15.0000 mg | EXTENDED_RELEASE_TABLET | Freq: Two times a day (BID) | ORAL | 0 refills | Status: DC

## 2016-04-08 MED ORDER — HYDROCODONE-ACETAMINOPHEN 5-325 MG PO TABS: 1.0000 | ORAL_TABLET | Freq: Four times a day (QID) | ORAL | 0 refills | Status: DC | PRN

## 2016-04-08 MED ORDER — CYCLOBENZAPRINE HCL 10 MG PO TABS: ORAL_TABLET | ORAL | 2 refills | Status: DC

## 2016-04-08 NOTE — Patient Instructions (Signed)
PLEASE CALL ME WITH ANY PROBLEMS OR QUESTIONS ([phone])  

## 2016-04-08 NOTE — Progress Notes (Signed)
Subjective:    Patient ID: Robin Morgan, female    DOB: 1956/02/27, 60 y.o.   MRN: [id number]  HPI   [name] is here in follow up of her chronic pain. She continues to have low back pain with basic activity such as sweeping or cleaning. I reviewed her lumbar xrays today:   Stable pattern of degenerative disease with bulky ventral spurring at L1-2 and L2-3. Mild height loss of discs from L1-L4. Mild L4-5 anterolisthesis is attributed to facet arthropathy. No endplate erosion, fracture deformity, or evidence of focal bone lesion.    Pain Inventory Average Pain 7 Pain Right Now 8 My pain is constant, stabbing and aching  In the last 24 hours, has pain interfered with the following? General activity 6 Relation with others 6 Enjoyment of life 6 What TIME of day is your pain at its worst? night Sleep (in general) Fair  Pain is worse with: bending and standing Pain improves with: heat/ice and medication Relief from Meds: 6  Mobility ability to climb steps?  yes do you drive?  yes  Function disabled: date disabled .  Neuro/Psych spasms  Prior Studies Any changes since last visit?  no  Physicians involved in your care Any changes since last visit?  no   Family History  Problem Relation Age of Onset  . Alzheimer's disease Mother   . Diabetes Father   . Heart disease Maternal Grandmother    Social History   Social History  . Marital status: Divorced    Spouse name: [name]  . Number of children: 1  . Years of education: [name]   Social History Main Topics  . Smoking status: Never Smoker  . Smokeless tobacco: Never Used  . Alcohol use No  . Drug use: No  . Sexual activity: Not Asked   Other Topics Concern  . None   Social History Narrative  . None   Past Surgical History:  Procedure Laterality Date  . ABDOMINAL HYSTERECTOMY    . APPENDECTOMY    . BREAST SURGERY     rt breast cyst  . CHOLECYSTECTOMY    . ROTATOR CUFF REPAIR     right  . SPINE SURGERY     . TONSILLECTOMY     Past Medical History:  Diagnosis Date  . Calcific tendonitis   . Cervical spondylosis without myelopathy   . Depression   . Diabetes mellitus   . Esophageal stricture   . Fibromyalgia   . GERD (gastroesophageal reflux disease)   . Hiatal hernia   . Hyperlipidemia   . Hypertension   . Myalgia   . Pain in joint, upper arm    BP (!) 148/78 (BP Location: Left Arm, Patient Position: Sitting, Cuff Size: Normal)   Pulse 94   Resp 16   SpO2 93%   Opioid Risk Score:   Fall Risk Score:  `1  Depression screen PHQ 2/9  Depression screen [facility] 2/9 04/08/2016 10/08/2015 11/21/2014  Decreased Interest 2 2 2   Down, Depressed, Hopeless 0 0 0  PHQ - 2 Score 2 2 2   Altered sleeping - - 2  Tired, decreased energy - - 2  Change in appetite - - 1  Feeling bad or failure about yourself  - - 0  Trouble concentrating - - 1  Moving slowly or fidgety/restless - - 0  Suicidal thoughts - - 0  PHQ-9 Score - - 8    Review of Systems  All other systems reviewed and are negative.  Objective:   Physical Exam  Constitutional: She is oriented to person, place, and time. She appears well-developed and well-nourished. She has gained a little weight.  HENT:  Head: Normocephalic and atraumatic.  Eyes: Conjunctivae and EOM are normal. Pupils are equal, round, and reactive to light. Perhaps a little exopthalmos  Neck: Normal range of motion.  Cardiovascular: Normal rate, regular rhythm and normal heart sounds.  Pulmonary/Chest: Breath sounds normal. No respiratory distress. She has no wheezes.  Abdominal: Soft. Bowel sounds are normal. She exhibits no distension.  Musculoskeletal: continued mid lumbar discomfort. Pain with flexion and extension today. Spasms and focal trigger points in right upper and mid trap (3 in total) upon palpation. Neck rom appears improved in general however . Neurological: She is alert and oriented to person, place, and time. She has normal strength and  normal reflexes. No sensory deficit.  Psychiatric: She has a normal mood and affect but perhaps a little flat.Marland Kitchen Her behavior is normal. Judgment and thought content normal. Mood is pleasant as always   Assessment & Plan:  ASSESSMENT:  1. Fibromyalgia with myofascial pain. --affecting right shoulder girdle today  2. Rotator cuff syndrome on the right.  3. History of repair was with chronic pain.  4. Cervicalgia. Post-laminectomy syndrome, facet arthropathy symptoms, associated myofascial pain and headaches. Symptoms improved after translaminar C7-T1 ESI (right). Persistent myofascial pain 5. Depression- which has improved  7. Mid/low back pain--  xrays of lumbar spine from 2014 show DDD in the upper lumbar segements. Anterior sendysmosis at L2-3--- AS?  8. Decreased bone density  10. Trigger finger left middle   PLAN:  1. Continue with ROM and activity to tolerance. 2. Hydrocodone 5/325 one-half to one p.o. B.i.d prn. #60-- RF today with second RF 3.Will refer to outpt PT  For lumbar rom/strengthening/HEP 4. Continue low dose ms contin. Rf today with second RF for next moth 5.. Follow up with me in about 2 months. 25 minutes of face to face patient care time were spent during this visit. All questions were encouraged and answered.

## 2016-04-08 NOTE — Telephone Encounter (Signed)
Prior Authorizations faxed to [employer] for patients pain medications, MSContin 15 mg ER and Norco/Vicodin 5-325mg  in lieu of recent medicaid rule changes

## 2016-04-16 ENCOUNTER — Telehealth: Payer: Self-pay | Admitting: Physical Medicine & Rehabilitation

## 2016-04-16 NOTE — Telephone Encounter (Signed)
Spoke with pt. Morphine was denied via  [employer]. PA for morphine has been resubmitted. Pt aware.

## 2016-04-16 NOTE — Telephone Encounter (Signed)
Patient has been without her morphine for a week and a half and needs to know if the prior authorization has been sent on this.  Please call patient.

## 2016-04-18 LAB — 6-ACETYLMORPHINE,TOXASSURE ADD
6-ACETYLMORPHINE: NEGATIVE
6-ACETYLMORPHINE: NOT DETECTED ng/mg{creat}

## 2016-04-18 LAB — TOXASSURE SELECT,+ANTIDEPR,UR

## 2016-04-21 NOTE — Telephone Encounter (Signed)
Pt's Morphine was approved via [place] [employer] for 1 year.

## 2016-04-21 NOTE — Telephone Encounter (Signed)
Patient called to follow up on CPAP, called last week and was advised to give it another week and call back. Please call [phone].

## 2016-04-21 NOTE — Telephone Encounter (Signed)
I spoke to pt. She hasn't heard from [facility] about getting her CPAP yet but she did get notice that her machine was approved by insurance. I advised her that I would reach out to [facility] and they will call her back. Pt verbalized understanding.  I emailed [facility] and advised them that pt wants to discuss her CPAP machine.

## 2016-06-08 ENCOUNTER — Encounter: Payer: Medicaid Other | Admitting: Registered Nurse

## 2016-06-10 ENCOUNTER — Other Ambulatory Visit: Payer: Self-pay | Admitting: *Deleted

## 2016-06-10 ENCOUNTER — Encounter (INDEPENDENT_AMBULATORY_CARE_PROVIDER_SITE_OTHER): Payer: Self-pay

## 2016-06-10 ENCOUNTER — Encounter: Payer: Self-pay | Admitting: Registered Nurse

## 2016-06-10 ENCOUNTER — Encounter: Payer: Medicaid Other | Attending: Physical Medicine and Rehabilitation | Admitting: Registered Nurse

## 2016-06-10 VITALS — BP 126/78 | HR 85

## 2016-06-10 DIAGNOSIS — G894 Chronic pain syndrome: Secondary | ICD-10-CM

## 2016-06-10 DIAGNOSIS — Z5181 Encounter for therapeutic drug level monitoring: Secondary | ICD-10-CM

## 2016-06-10 DIAGNOSIS — M797 Fibromyalgia: Secondary | ICD-10-CM

## 2016-06-10 DIAGNOSIS — M4716 Other spondylosis with myelopathy, lumbar region: Secondary | ICD-10-CM | POA: Diagnosis not present

## 2016-06-10 DIAGNOSIS — M961 Postlaminectomy syndrome, not elsewhere classified: Secondary | ICD-10-CM | POA: Diagnosis not present

## 2016-06-10 DIAGNOSIS — M609 Myositis, unspecified: Secondary | ICD-10-CM | POA: Insufficient documentation

## 2016-06-10 DIAGNOSIS — Z79899 Other long term (current) drug therapy: Secondary | ICD-10-CM

## 2016-06-10 DIAGNOSIS — M791 Myalgia: Secondary | ICD-10-CM | POA: Insufficient documentation

## 2016-06-10 MED ORDER — HYDROCODONE-ACETAMINOPHEN 5-325 MG PO TABS: 1.0000 | ORAL_TABLET | Freq: Four times a day (QID) | ORAL | 0 refills | Status: DC | PRN

## 2016-06-10 NOTE — Progress Notes (Signed)
Subjective:    Patient ID: Robin Morgan, female    DOB: [birth year], 60 y.o.   MRN: [id number]  HPI:  [name] is a 60 year old female who returns for follow up appointment for chronic pain and medication refill. She states her pain is located in her  upper-lower-back. She rates her pain 8. Her current exercise regime is walking and performing stretching exercises.  Pain Inventory Average Pain 8 Pain Right Now 8 My pain is constant and aching  In the last 24 hours, has pain interfered with the following? General activity 8 Relation with others 8 Enjoyment of life 8 What TIME of day is your pain at its worst? daytime, night Sleep (in general) Fair  Pain is worse with: worse when in bed Pain improves with: n/a Relief from Meds: 8  Mobility ability to climb steps?  yes do you drive?  yes  Function disabled: date disabled n/a  Neuro/Psych numbness spasms  Prior Studies Any changes since last visit?  no  Physicians involved in your care Any changes since last visit?  no   Family History  Problem Relation Age of Onset  . Alzheimer's disease Mother   . Diabetes Father   . Heart disease Maternal Grandmother    Social History   Social History  . Marital status: Divorced    Spouse name: [name]  . Number of children: 1  . Years of education: [name]   Social History Main Topics  . Smoking status: Never Smoker  . Smokeless tobacco: Never Used  . Alcohol use No  . Drug use: No  . Sexual activity: Not Asked   Other Topics Concern  . None   Social History Narrative  . None   Past Surgical History:  Procedure Laterality Date  . ABDOMINAL HYSTERECTOMY    . APPENDECTOMY    . BREAST SURGERY     rt breast cyst  . CHOLECYSTECTOMY    . ROTATOR CUFF REPAIR     right  . SPINE SURGERY    . TONSILLECTOMY     Past Medical History:  Diagnosis Date  . Calcific tendonitis   . Cervical spondylosis without myelopathy   . Depression   . Diabetes mellitus     . Esophageal stricture   . Fibromyalgia   . GERD (gastroesophageal reflux disease)   . Hiatal hernia   . Hyperlipidemia   . Hypertension   . Myalgia   . Pain in joint, upper arm    BP 126/78   Pulse 85   SpO2 97%   Opioid Risk Score:   Fall Risk Score:  `1  Depression screen PHQ 2/9  Depression screen [facility] 2/9 04/08/2016 10/08/2015 11/21/2014  Decreased Interest 2 2 2   Down, Depressed, Hopeless 0 0 0  PHQ - 2 Score 2 2 2   Altered sleeping - - 2  Tired, decreased energy - - 2  Change in appetite - - 1  Feeling bad or failure about yourself  - - 0  Trouble concentrating - - 1  Moving slowly or fidgety/restless - - 0  Suicidal thoughts - - 0  PHQ-9 Score - - 8     Review of Systems  Endocrine:       High blood sugar Low blood sugar  Neurological: Positive for numbness.       Spasms  All other systems reviewed and are negative.      Objective:   Physical Exam  Constitutional: She is oriented to  person, place, and time. She appears well-developed and well-nourished.  HENT:  Head: Normocephalic and atraumatic.  Neck: Normal range of motion. Neck supple.  Cardiovascular: Normal rate and regular rhythm.   Pulmonary/Chest: Effort normal and breath sounds normal.  Musculoskeletal:  Normal Muscle Bulk and Muscle Testing Reveals: Upper Extremities: Full ROM and Muscle Strength 5/5 Thoracic Paraspinal Tenderness: T-1- T-3 Mainly Right Side Lower Extremities: Full ROM and Muscle Strength 5/5 Arises from Table with ease Narrow Based Gait   Neurological: She is alert and oriented to person, place, and time.  Skin: Skin is warm and dry.  Psychiatric: She has a normal mood and affect.  Nursing note and vitals reviewed.         Assessment & Plan:  1. Fibromyalgia with myofascial pain: Continue exercise and heat therapy.  2. Rotator cuff syndrome on the right: Continue with stretching exercises and heat therapy.  3. Cervicalgia. Post-laminectomy syndrome, facet  arthropathy: Continue Voltaren gel. 4. Depression: Continue Cymbalta 5. Mid/low back pain: Continue Current Medication and stretching and heat therapy.  6 OA of right hand: No complaints voiced. Continue using Voltaren gel and heat therapy  7. Migraines: Continue Hydrocodone 5/325 mg one tablet every 6 hours as needed #60. Second script given for the following month  15 minutes of face to face patient care time was spent during this visit. All questions were encouraged and answered.   F/U in 2 months

## 2016-06-15 ENCOUNTER — Encounter: Payer: Self-pay | Admitting: Neurology

## 2016-06-15 ENCOUNTER — Ambulatory Visit (INDEPENDENT_AMBULATORY_CARE_PROVIDER_SITE_OTHER): Payer: Medicaid Other | Admitting: Neurology

## 2016-06-15 ENCOUNTER — Telehealth: Payer: Self-pay | Admitting: Physical Medicine & Rehabilitation

## 2016-06-15 VITALS — BP 118/82 | HR 88 | Resp 20 | Ht 61.0 in | Wt 147.0 lb

## 2016-06-15 DIAGNOSIS — G4734 Idiopathic sleep related nonobstructive alveolar hypoventilation: Secondary | ICD-10-CM

## 2016-06-15 DIAGNOSIS — Z9989 Dependence on other enabling machines and devices: Secondary | ICD-10-CM | POA: Diagnosis not present

## 2016-06-15 DIAGNOSIS — G4733 Obstructive sleep apnea (adult) (pediatric): Secondary | ICD-10-CM | POA: Diagnosis not present

## 2016-06-15 NOTE — Telephone Encounter (Signed)
Return Ms. Horenstein call, she states she has been having increase intensity of low back not being controlled with the hydrocodone. She asked if she could resume her MS Contin.  She was encouraged to use  The Voltaren gel, heat and ice therapy.  Also instructed to use Hydrocodone TID and call office on Monday 06/22/2016. She verbalizes understanding.   Her last Morphine script was picked up on  03/09/2016. She verbalizes understanding.

## 2016-06-15 NOTE — Telephone Encounter (Signed)
Patient would like for Zella Ball to call her, she is needing something for back pain--they did speak about going back on morphine.  Please call her at [phone].

## 2016-06-15 NOTE — Progress Notes (Signed)
ONO on CPAP order sent to [facility]. Received a receipt of confirmation.

## 2016-06-15 NOTE — Patient Instructions (Signed)
Hypoxemia °Hypoxemia occurs when your blood does not contain enough oxygen. The body cannot work well when it does not have enough oxygen because every part of your body needs oxygen. Oxygen travels to all parts of the body through your blood. Hypoxemia can develop suddenly or can come on slowly. °CAUSES °Some common causes of hypoxemia include: °· Long-term (chronic) lung diseases, such as chronic obstructive pulmonary disease (COPD) or interstitial lung disease.  °· Disorders that affect breathing at night, such as sleep apnea. °· Fluid buildup in your lungs (pulmonary edema).  °· Lung infection (pneumonia). °· Lung or throat cancer. °· Abnormal blood flow that bypasses the lungs (shunt). °· Certain diseases that affect nerves or muscles. °· A collapsed lung (pneumothorax). °· A blood clot in the lungs (pulmonary embolus). °· Certain types of heart disease. °· Slow or shallow breathing (hypoventilation).  °· Certain medicines. °· High altitudes. °· Toxic chemicals and gases. °SIGNS AND SYMPTOMS °Not everyone who has hypoxemia will develop symptoms. If the hypoxemia developed quickly, you will likely have symptoms such as shortness of breath. If the hypoxemia came on slowly over months or years, you may not notice any symptoms. Symptoms can include: °· Shortness of breath (dyspnea). °· Bluish color of the skin, lips, or nail beds. °· Breathing that is fast, noisy, or shallow. °· A fast heartbeat. °· Feeling tired or sleepy. °· Being confused or feeling anxious. °DIAGNOSIS °To determine if you have hypoxemia, your health care provider may perform: °· A physical exam. °· Blood tests. °· A pulse oximetry. A sensor will be put on your finger, toe, or earlobe to measure the percent of oxygen in your blood. °TREATMENT °You will likely be treated with oxygen therapy. Depending on the cause of your hypoxemia, you may need oxygen for a short time (weeks or months), or you may need it indefinitely. Your health care provider  may also recommend other therapies to treat the underlying cause of your hypoxemia. °HOME CARE INSTRUCTIONS °· Only take over-the-counter or prescription medicines as directed by your health care provider. °· Follow oxygen safety measures if you are on oxygen therapy. These may include: °¨ Always having a backup supply of oxygen. °¨ Not allowing anyone to smoke around oxygen. °¨ Handling the oxygen tanks carefully and as instructed. °· If you smoke, quit. Stay away from people who smoke. °· Follow up with your health care provider as directed. °SEEK MEDICAL CARE IF: °· You have any concerns about your oxygen therapy. °· You still have trouble breathing. °· You become short of breath when you exercise. °· You are tired when you wake up. °· You have a headache when you wake up. °SEEK IMMEDIATE MEDICAL CARE IF:  °· Your breathing gets worse. °· You have new shortness of breath with normal activity. °· You have a bluish color of the skin, lips, or nail beds. °· You have confusion or cloudy thinking. °· You cough up dark mucus. °· You have chest pain. °· You have a fever. °MAKE SURE YOU: °· Understand these instructions. °· Will watch your condition. °· Will get help right away if you are not doing well or get worse. °  °This information is not intended to replace advice given to you by your health care provider. Make sure you discuss any questions you have with your health care provider. °  °Document Released: 02/09/2011 Document Revised: 08/01/2013 Document Reviewed: 02/23/2013 °Elsevier Interactive Patient Education ©2016 [employer] ° °

## 2016-06-15 NOTE — Progress Notes (Signed)
[facility]   Provider:  Larey Seat, M D  Referring Provider: Leamon Arnt, MD Primary Care Physician:  Leamon Arnt, MD  Chief Complaint  Patient presents with  . Follow-up    having pain, cpap going ok    HPI:  TAMIKE Morgan is a 60 y.o. female , seen here as a referral from Dr. Jonni Sanger for A sleep evaluation. Robin Morgan is further followed by Dr. Naaman Plummer as her pain management doctor, suffers from fibromyalgia. She reportedly has rarely a very good day but her sleep evaluation is necessary to distinguish if other organic factors may affect her sleep aside from her painful condition. She has chronic insomnia and has been treated with Ambien, prescribed by Dr. Jonni Sanger. An organic cause for the insomnia was never found. In her past medical history it is calcified as insomnia secondary to medical condition, chronic pain.  Chief complaint according to patient : Her grandson has witnessed her to snore and to stop breathing in her sleep.  Sleep habits are as follows: It'll take her Ambien usually at around 8 PM and some evening she will be asleep within 30 minutes and other evening's 120 minutes later. She usually can sleep through for about 4 hours sometimes she can sleep through the night, 7 or 8 hours. She will have some nights one bathroom break only around 2 AM. She will sleep on one pillow for head support 1 resting beneath her knees and always on her side. She lives alone. Her bedroom is described as cool, quiet and dark. She uses her smart phone alarm to wake up in the morning the first one rings at 7:30 AM the second at 8:30. Usually when she wakes up she feels neither restored or refreshed. She is a very dry mouth which can be medication related and also can be provoked by snoring. Sometimes she wakes up with cephalalgia for cervicalgia.  Sleep medical history and family sleep history: Her sister has obstructive sleep apnea, and a first cousin maternal side. She  underwent tonsillectomy in childhood, no traumatic head or neck injuries, no ENT surgeries otherwise. She was a sleep walker before school age.  Social history: She has no history of shift work or swing shift work. She is on disability. She used to work in a physically active capacity. No history of tobacco use, alcohol use, she does not use caffeine aged beverages either as these affect her fibromyalgia negatively.  Interval history from 06/15/2016. [name] appeared on 02/26/2016 for scheduled sleep study. The study showed a very mild apnea her AHI was 7.5 but during REM sleep accentuated to 32.2. Most concerning was that she remained hypoxic for 371 minutes , almost throughout the sleep study. She had no periodic limb movements she did have some tachycardia, loud snoring was noted but did not arouse her. Given the hypoxemia I asked her to return for a sleep attended CPAP titration which took place on 03/17/2016. She was titrated to 14 cm water pressure and did best at 10 and 12 cm water pressure. For this reason I ordered an auto titrator for home use. She was also given an air-fit  P 10 in extra small pillow size. I have the pleasure of seeing the compliance report for the CPAP today the patient has been very compliant has used the machine every day of the last 30 days and 93% of the time over 4 hours. 6 hours and 5 minutes nightly use is her average, her  AHI is 1.7 the AutoSet between 7 and 14 cm also documented and 95th percentile pressure of 13.9. This pressure needed higher than I anticipated. She feels more restored refreshed endorsed the Epworth score at 0 points, the fatigue severity at 19 points a great reduction for the form of 46 points and the geriatric depression score at 4 out of 15 points down from 7 points.  We will order an overnight pulseoximetry on CPAP to assure she has no longer hypoxemia.   Review of Systems: Out of a complete 14 system review, the patient complains of only the  following symptoms, and all other reviewed systems are negative. Snoring, apnea witnessed by grandson, 62 years old.   Lost her niece at age 69 last week, due to an ulcer, may have overdosed on Dilaudid. Died one day after an endoscopy. She had 13 surgical procedures en toto .    Social History   Social History  . Marital status: Divorced    Spouse name: [name]  . Number of children: 1  . Years of education: [name]   Occupational History  . Not on file.   Social History Main Topics  . Smoking status: Never Smoker  . Smokeless tobacco: Never Used  . Alcohol use No  . Drug use: No  . Sexual activity: Not on file   Other Topics Concern  . Not on file   Social History Narrative  . No narrative on file    Family History  Problem Relation Age of Onset  . Alzheimer's disease Mother   . Diabetes Father   . Heart disease Maternal Grandmother     Past Medical History:  Diagnosis Date  . Calcific tendonitis   . Cervical spondylosis without myelopathy   . Depression   . Diabetes mellitus   . Esophageal stricture   . Fibromyalgia   . GERD (gastroesophageal reflux disease)   . Hiatal hernia   . Hyperlipidemia   . Hypertension   . Myalgia   . Pain in joint, upper arm     Past Surgical History:  Procedure Laterality Date  . ABDOMINAL HYSTERECTOMY    . APPENDECTOMY    . BREAST SURGERY     rt breast cyst  . CHOLECYSTECTOMY    . ROTATOR CUFF REPAIR     right  . SPINE SURGERY    . TONSILLECTOMY      Current Outpatient Prescriptions  Medication Sig Dispense Refill  . amitriptyline (ELAVIL) 25 MG tablet TAKE ONE TABLET (25 MG TOTAL) BY MOUTH AT BEDTIME.  3  . atorvastatin (LIPITOR) 40 MG tablet Take 40 mg by mouth daily.  3  . b complex vitamins tablet Take 1 tablet by mouth daily.    . Calcium Carbonate-Vitamin D (CALCIUM-VITAMIN D) 500-200 MG-UNIT per tablet Take 1 tablet by mouth 2 (two) times daily with a meal.    . co-enzyme Q-10 30 MG capsule Take 30 mg by mouth 3  (three) times daily.    . cyclobenzaprine (FLEXERIL) 10 MG tablet TAKE 1 TABLET (10 MG TOTAL) BY MOUTH EVERY 8 (EIGHT) HOURS AS NEEDED. 60 tablet 2  . diclofenac sodium (VOLTAREN) 1 % GEL Apply 1 application topically 3 (three) times daily. 3 Tube 3  . dicyclomine (BENTYL) 10 MG capsule Take 1 capsule by mouth.  2  . docusate calcium (SURFAK) 240 MG capsule Take 1 capsule by mouth daily.    . DULoxetine (CYMBALTA) 60 MG capsule Take 60 mg by mouth daily.    Marland Kitchen  fenofibrate 160 MG tablet Take 160 mg by mouth daily.    Marland Kitchen gabapentin (NEURONTIN) 300 MG capsule TAKE 1 CAPSULE BY MOUTH 3 TIMES DAILY. 90 capsule 3  . glipiZIDE (GLUCOTROL XL) 5 MG 24 hr tablet Take 5 mg by mouth every morning.  2  . HYDROcodone-acetaminophen (NORCO/VICODIN) 5-325 MG tablet Take 1 tablet by mouth every 6 (six) hours as needed. 60 tablet 0  . insulin glargine (LANTUS) 100 UNIT/ML injection Inject 52 Units into the skin at bedtime.     Marland Kitchen levothyroxine (SYNTHROID, LEVOTHROID) 50 MCG tablet Take 50 mcg by mouth daily.    Marland Kitchen lisinopril (PRINIVIL,ZESTRIL) 10 MG tablet Take 10 mg by mouth daily.    . magnesium gluconate (MAGONATE) 500 MG tablet Take 500 mg by mouth daily.    . meloxicam (MOBIC) 7.5 MG tablet TAKE 1 TABLET (7.5 MG TOTAL) BY MOUTH DAILY. 30 tablet 4  . metFORMIN (GLUCOPHAGE) 1000 MG tablet Take 1 tablet by mouth 2 (two) times daily.  3  . NEXIUM 20 MG capsule Take 1 capsule by mouth at bedtime.  1  . zolpidem (AMBIEN) 10 MG tablet Take 1 tablet (10 mg total) by mouth at bedtime as needed. 30 tablet 0   No current facility-administered medications for this visit.     Allergies as of 06/15/2016 - Review Complete 06/15/2016  Allergen Reaction Noted  . Compazine Other (See Comments) 08/25/2011  . Naproxen Itching 08/25/2011    Vitals: BP 118/82   Pulse 88   Resp 20   Ht 5\' 1"  (1.549 m)   Wt 147 lb (66.7 kg)   BMI 27.78 kg/m  Last Weight:  Wt Readings from Last 1 Encounters:  06/15/16 147 lb (66.7 kg)    [id number] mass index is 27.78 kg/m.     Last Height:   Ht Readings from Last 1 Encounters:  06/15/16 5\' 1"  (1.549 m)    Physical exam:  General: The patient is awake, alert and appears not in acute distress. The patient is well groomed. Head: Normocephalic, atraumatic. Neck is supple. Mallampati 4,  neck circumference: 15.5. Nasal airflow patent. Retrognathia is seen.  Cardiovascular:  Regular rate and rhythm, without  murmurs or carotid bruit, and without distended neck veins. Respiratory: Lungs are clear to auscultation. Skin:  Without evidence of edema, or rash Trunk: BMI is elevated ,  Neurologic exam : Mood and affect are appropriate.  Cranial nerves: Pupils are equal and briskly reactive to light. Extraocular movements  in vertical and horizontal planes intact and without nystagmus. Visual fields by finger perimetry are intact.  Facial motor strength is symmetric and tongue and uvula move midline. Shoulder shrug was symmetrical.   The patient was advised of the nature of the diagnosed sleep disorder , the treatment options and risks for general a health and wellness arising from not treating the condition.  I spent more than 25 minutes of face to face time with the patient. Greater than 50% of time was spent in counseling and coordination of care. We have discussed the diagnosis and differential and I answered the patient's questions.     Assessment:  After physical and neurologic examination, review of laboratory studies,  Personal review of imaging studies, reports of other /same  Imaging studies ,  Results of polysomnography/ neurophysiology testing and pre-existing records as far as provided in visit., my assessment is   1)  Mild OSA with strong REM accentuation.  2) Hypoxemia of sleep, severe, prolonged.   Plan:  Treatment  plan and additional workup :  I like for Mrs. Kepley to continue with the CPAP using it at the current setting. I will order an overnight pulse oximetry   to be used while on CPAP , to be sure that the CPAP therapy actually helped her  hypoxemia RV in 30 days with Np, after that time yearly for CPAP> follow up.   Asencion Partridge Jeet Shough MD  06/15/2016   CC: Leamon Arnt, Md [address] [place],  [address]

## 2016-06-16 ENCOUNTER — Telehealth: Payer: Self-pay | Admitting: Physical Medicine & Rehabilitation

## 2016-06-16 MED ORDER — GABAPENTIN 300 MG PO CAPS: 300.0000 mg | ORAL_CAPSULE | Freq: Three times a day (TID) | ORAL | 3 refills | Status: DC

## 2016-06-16 NOTE — Telephone Encounter (Signed)
Patient needs a refill on Gabapentin.  Please call patient when this is done.

## 2016-06-30 ENCOUNTER — Telehealth: Payer: Self-pay

## 2016-06-30 NOTE — Telephone Encounter (Signed)
I spoke to pt. I advised her that per Dr. Brett Fairy, her ONO results were normal and there is no need at this time for O2. Pt verbalized understanding of results. Pt had no questions at this time but was encouraged to call back if questions arise.

## 2016-07-13 ENCOUNTER — Ambulatory Visit (INDEPENDENT_AMBULATORY_CARE_PROVIDER_SITE_OTHER): Payer: Medicaid Other | Admitting: Adult Health

## 2016-07-13 ENCOUNTER — Encounter: Payer: Self-pay | Admitting: Adult Health

## 2016-07-13 VITALS — BP 114/65 | HR 91 | Ht 61.0 in | Wt 145.0 lb

## 2016-07-13 DIAGNOSIS — G4733 Obstructive sleep apnea (adult) (pediatric): Secondary | ICD-10-CM | POA: Diagnosis not present

## 2016-07-13 DIAGNOSIS — Z9989 Dependence on other enabling machines and devices: Secondary | ICD-10-CM

## 2016-07-13 NOTE — Progress Notes (Signed)
I agree with the assessment and plan as directed by NP .The patient is known to me .   Brentt Fread, MD  

## 2016-07-13 NOTE — Patient Instructions (Signed)
CPAP download is excellent  Mask refitted If your symptoms worsen or you develop new symptoms please let us know.

## 2016-07-13 NOTE — Progress Notes (Signed)
PATIENT: Robin Morgan DOB: [birth year]  REASON FOR VISIT: follow up-CPAP HISTORY FROM: patient  HISTORY OF PRESENT ILLNESS: Robin Morgan is a 61 year old female with a history of obstructive sleep apnea for CPAP. She returns today for an evaluation. She is currently using the nasal pillows. Her only concern with the CPAP is that the nasal pillows are irritating her nose. Her download indicates that she uses her machine 30 out of 30 days for compliance of 100%. She uses her machine greater than 4 hours 26 out of 30 days for compliance of 87%. Her residual AHI is 2.1 on a minimum pressure of 7 cm of water and maximum pressure 14 cm water. She does not have a significant leak. The patient's Epworth sleepiness score is 1 and fatigue severity score is 26. She feels that the CPAP is working well for her. However she would like to try a new mask to make it more comfortable to use. She returns today for an evaluation.  HISTORY Per Dr. Edwena Felty notes: Robin Morgan is a 60 y.o. female , seen here as a referral from Dr. Jonni Sanger for A sleep evaluation. Robin Morgan is further followed by Dr. Naaman Plummer as her pain management doctor, suffers from fibromyalgia. She reportedly has rarely a very good day but her sleep evaluation is necessary to distinguish if other organic factors may affect her sleep aside from her painful condition. She has chronic insomnia and has been treated with Ambien, prescribed by Dr. Jonni Sanger. An organic cause for the insomnia was never found. In her past medical history it is calcified as insomnia secondary to medical condition, chronic pain.  Chief complaint according to patient : Her grandson has witnessed her to snore and to stop breathing in her sleep.  Sleep habits are as follows: It'll take her Ambien usually at around 8 PM and some evening she will be asleep within 30 minutes and other evening's 120 minutes later. She usually can sleep through for about 4 hours sometimes she can  sleep through the night, 7 or 8 hours. She will have some nights one bathroom break only around 2 AM. She will sleep on one pillow for head support 1 resting beneath her knees and always on her side. She lives alone. Her bedroom is described as cool, quiet and dark. She uses her smart phone alarm to wake up in the morning the first one rings at 7:30 AM the second at 8:30. Usually when she wakes up she feels neither restored or refreshed. She is a very dry mouth which can be medication related and also can be provoked by snoring. Sometimes she wakes up with cephalalgia for cervicalgia.   Interval history from 06/15/2016. Robin Morgan appeared on 02/26/2016 for scheduled sleep study. The study showed a very mild apnea her AHI was 7.5 but during REM sleep accentuated to 32.2. Most concerning was that she remained hypoxic for 371 minutes , almost throughout the sleep study. She had no periodic limb movements she did have some tachycardia, loud snoring was noted but did not arouse her. Given the hypoxemia I asked her to return for a sleep attended CPAP titration which took place on 03/17/2016. She was titrated to 14 cm water pressure and did best at 10 and 12 cm water pressure. For this reason I ordered an auto titrator for home use. She was also given an air-fit  P 10 in extra small pillow size. I have the pleasure of seeing the compliance report for the  CPAP today the patient has been very compliant has used the machine every day of the last 30 days and 93% of the time over 4 hours. 6 hours and 5 minutes nightly use is her average, her AHI is 1.7 the AutoSet between 7 and 14 cm also documented and 95th percentile pressure of 13.9. This pressure needed higher than I anticipated. She feels more restored refreshed endorsed the Epworth score at 0 points, the fatigue severity at 19 points a great reduction for the form of 46 points and the geriatric depression score at 4 out of 15 points down from 7 points.  We will order  an overnight pulseoximetry on CPAP to assure she has no longer hypoxemia.   REVIEW OF SYSTEMS: Out of a complete 14 system review of symptoms, the patient complains only of the following symptoms, and all other reviewed systems are negative.  Choking, chest tightness, ringing in ears, trouble swallowing, constipation, diarrhea, numbness  ALLERGIES: Allergies  Allergen Reactions  . Compazine Other (See Comments)    Seizure-like reaction  . Naproxen Itching    HOME MEDICATIONS: Outpatient Medications Prior to Visit  Medication Sig Dispense Refill  . amitriptyline (ELAVIL) 25 MG tablet TAKE ONE TABLET (25 MG TOTAL) BY MOUTH AT BEDTIME.  3  . atorvastatin (LIPITOR) 40 MG tablet Take 40 mg by mouth daily.  3  . b complex vitamins tablet Take 1 tablet by mouth daily.    . Calcium Carbonate-Vitamin D (CALCIUM-VITAMIN D) 500-200 MG-UNIT per tablet Take 1 tablet by mouth 2 (two) times daily with a meal.    . co-enzyme Q-10 30 MG capsule Take 30 mg by mouth 3 (three) times daily.    . cyclobenzaprine (FLEXERIL) 10 MG tablet TAKE 1 TABLET (10 MG TOTAL) BY MOUTH EVERY 8 (EIGHT) HOURS AS NEEDED. 60 tablet 2  . diclofenac sodium (VOLTAREN) 1 % GEL Apply 1 application topically 3 (three) times daily. 3 Tube 3  . dicyclomine (BENTYL) 10 MG capsule Take 1 capsule by mouth.  2  . docusate calcium (SURFAK) 240 MG capsule Take 1 capsule by mouth daily.    . DULoxetine (CYMBALTA) 60 MG capsule Take 60 mg by mouth daily.    . fenofibrate 160 MG tablet Take 160 mg by mouth daily.    Marland Kitchen gabapentin (NEURONTIN) 300 MG capsule Take 1 capsule (300 mg total) by mouth 3 (three) times daily. 90 capsule 3  . glipiZIDE (GLUCOTROL XL) 5 MG 24 hr tablet Take 5 mg by mouth every morning.  2  . HYDROcodone-acetaminophen (NORCO/VICODIN) 5-325 MG tablet Take 1 tablet by mouth every 6 (six) hours as needed. 60 tablet 0  . insulin glargine (LANTUS) 100 UNIT/ML injection Inject 52 Units into the skin at bedtime.     Marland Kitchen  levothyroxine (SYNTHROID, LEVOTHROID) 50 MCG tablet Take 50 mcg by mouth daily.    Marland Kitchen lisinopril (PRINIVIL,ZESTRIL) 10 MG tablet Take 10 mg by mouth daily.    . magnesium gluconate (MAGONATE) 500 MG tablet Take 500 mg by mouth daily.    . meloxicam (MOBIC) 7.5 MG tablet TAKE 1 TABLET (7.5 MG TOTAL) BY MOUTH DAILY. 30 tablet 4  . metFORMIN (GLUCOPHAGE) 1000 MG tablet Take 1 tablet by mouth 2 (two) times daily.  3  . NEXIUM 20 MG capsule Take 1 capsule by mouth at bedtime.  1  . zolpidem (AMBIEN) 10 MG tablet Take 1 tablet (10 mg total) by mouth at bedtime as needed. 30 tablet 0   No facility-administered medications  prior to visit.     PAST MEDICAL HISTORY: Past Medical History:  Diagnosis Date  . Calcific tendonitis   . Cervical spondylosis without myelopathy   . Depression   . Diabetes mellitus   . Esophageal stricture   . Fibromyalgia   . GERD (gastroesophageal reflux disease)   . Hiatal hernia   . Hyperlipidemia   . Hypertension   . Myalgia   . Pain in joint, upper arm     PAST SURGICAL HISTORY: Past Surgical History:  Procedure Laterality Date  . ABDOMINAL HYSTERECTOMY    . APPENDECTOMY    . BREAST SURGERY     rt breast cyst  . CHOLECYSTECTOMY    . ROTATOR CUFF REPAIR     right  . SPINE SURGERY    . TONSILLECTOMY      FAMILY HISTORY: Family History  Problem Relation Age of Onset  . Alzheimer's disease Mother   . Diabetes Father   . Heart disease Maternal Grandmother     SOCIAL HISTORY: Social History   Social History  . Marital status: Divorced    Spouse name: [name]  . Number of children: 1  . Years of education: [name]   Occupational History  . Not on file.   Social History Main Topics  . Smoking status: Never Smoker  . Smokeless tobacco: Never Used  . Alcohol use No  . Drug use: No  . Sexual activity: Not on file   Other Topics Concern  . Not on file   Social History Narrative  . No narrative on file      PHYSICAL EXAM  Vitals:    07/13/16 1046  BP: 114/65  Pulse: 91  Weight: 145 lb (65.8 kg)  Height: 5\' 1"  (1.549 m)   Body mass index is 27.4 kg/m.  Generalized: Well developed, in no acute distress  Neck: Conference 15-1/2 inches  Neurological examination  Mentation: Alert oriented to time, place, history taking. Follows all commands speech and language fluent Cranial nerve II-XII: Pupils were equal round reactive to light. Extraocular movements were full, visual field were full on confrontational test. Facial sensation and strength were normal. Uvula tongue midline. Head turning and shoulder shrug  were normal and symmetric. Mallampati 3+ Motor: The motor testing reveals 5 over 5 strength of all 4 extremities. Good symmetric motor tone is noted throughout.  Sensory: Sensory testing is intact to soft touch on all 4 extremities. No evidence of extinction is noted.  Coordination: Cerebellar testing reveals good finger-nose-finger and heel-to-shin bilaterally.  Gait and station: Gait is normal.  Reflexes: Deep tendon reflexes are symmetric and normal bilaterally.   DIAGNOSTIC DATA (LABS, IMAGING, TESTING) - I reviewed patient records, labs, notes, testing and imaging myself where available.      ASSESSMENT AND PLAN 60 y.o. year old female  has a past medical history of Calcific tendonitis; Cervical spondylosis without myelopathy; Depression; Diabetes mellitus; Esophageal stricture; Fibromyalgia; GERD (gastroesophageal reflux disease); Hiatal hernia; Hyperlipidemia; Hypertension; Myalgia; and Pain in joint, upper arm. here with:  1. Obstructive sleep apnea on CPAP  Patient's download shows excellent compliance. We will have the patient mask refitted today. Patient advised that if her symptoms worsen or she develops any new symptoms she she'll let us know. She will follow-up in one year or sooner if needed.  A new prescription for dreamwear size small mask was sent in for the patient. She was also sent home will  size small nasal pillows to see if that was beneficial.  Ward Givens, MSN, NP-C 07/13/2016, 11:12 AM [facility] [address], [place], [place] [address] [phone]

## 2016-07-14 ENCOUNTER — Encounter: Payer: Self-pay | Admitting: Physical Medicine & Rehabilitation

## 2016-07-14 ENCOUNTER — Encounter: Payer: Medicaid Other | Attending: Physical Medicine and Rehabilitation | Admitting: Physical Medicine & Rehabilitation

## 2016-07-14 VITALS — BP 120/71 | HR 78 | Resp 14

## 2016-07-14 DIAGNOSIS — M609 Myositis, unspecified: Secondary | ICD-10-CM | POA: Insufficient documentation

## 2016-07-14 DIAGNOSIS — M961 Postlaminectomy syndrome, not elsewhere classified: Secondary | ICD-10-CM

## 2016-07-14 DIAGNOSIS — M7918 Myalgia, other site: Secondary | ICD-10-CM

## 2016-07-14 DIAGNOSIS — M791 Myalgia: Secondary | ICD-10-CM | POA: Insufficient documentation

## 2016-07-14 DIAGNOSIS — M797 Fibromyalgia: Secondary | ICD-10-CM | POA: Diagnosis not present

## 2016-07-14 MED ORDER — HYDROCODONE-ACETAMINOPHEN 5-325 MG PO TABS: 1.0000 | ORAL_TABLET | Freq: Four times a day (QID) | ORAL | 0 refills | Status: DC | PRN

## 2016-07-14 NOTE — Progress Notes (Signed)
Subjective:    Patient ID: Robin Morgan, female    DOB: 08-21-1955, 60 y.o.   MRN: [id number]  HPI   Robin Morgan is here in follow up of her chronic pain. She has had pain and tingling starting in her left shoulder and radiating down to her left hand for the last two weeks. It bothers her more when she's lifting her arm overhead.    Foramen magnum and C1-2 are widely patent. Ordinary osteoarthritis of the C1-2 articulation but no encroachment upon the neural spaces.  C2-3:  No disc pathology.  Minimal facet degeneration.  No stenosis.  C3-4: Bilateral uncovertebral prominence and bulging of the disc. Mild narrowing of the ventral subarachnoid space but no compression of the cord. Mild facet degeneration on the left. Mild foraminal narrowing the left there  C4-5: Spondylosis with endplate osteophytes and bulging of the disc. Narrowing of the ventral subarachnoid space but no cord compression. Facet degeneration on the right. Mild foraminal narrowing on the right because of osteophytic encroachment.  C5-6: Spondylosis with endplate osteophytes and bulging of the disc. Narrowing of the ventral subarachnoid space no compression of the cord. No facet arthropathy. Foraminal narrowing bilaterally because of osteophytic encroachment.  C6-7: Spondylosis with endplate osteophytes and bulging of the disc. No compressive canal or foraminal narrowing. No facet arthropathy.  C7-T1: Mild facet degeneration with 1 mm of anterolisthesis. Mild bulging of the disc. No compressive narrowing of the canal for hip.   Her back continues to give her problems but it's manageable. She has been also having tingling in her left mor than right foot and wonders if it's related to her DM.   She continues with the hydrocodone for breakthrgouh pain. She's using 2 per day most of the time.     Pain Inventory Average Pain 7 Pain Right Now 7 My pain is constant and tingling  In the last 24 hours,  has pain interfered with the following? General activity 8 Relation with others 6 Enjoyment of life 7 What TIME of day is your pain at its worst? morning, night Sleep (in general) Fair  Pain is worse with: bending and sitting Pain improves with: medication Relief from Meds: n/a  Mobility ability to climb steps?  yes do you drive?  yes  Function disabled: date disabled n/a  Neuro/Psych numbness tingling spasms depression  Prior Studies Any changes since last visit?  no  Physicians involved in your care Any changes since last visit?  no   Family History  Problem Relation Age of Onset  . Alzheimer's disease Mother   . Diabetes Father   . Heart disease Maternal Grandmother    Social History   Social History  . Marital status: Divorced    Spouse name: [name]  . Number of children: 1  . Years of education: [name]   Social History Main Topics  . Smoking status: Never Smoker  . Smokeless tobacco: Never Used  . Alcohol use No  . Drug use: No  . Sexual activity: Not on file   Other Topics Concern  . Not on file   Social History Narrative  . No narrative on file   Past Surgical History:  Procedure Laterality Date  . ABDOMINAL HYSTERECTOMY    . APPENDECTOMY    . BREAST SURGERY     rt breast cyst  . CHOLECYSTECTOMY    . ROTATOR CUFF REPAIR     right  . SPINE SURGERY    . TONSILLECTOMY  Past Medical History:  Diagnosis Date  . Calcific tendonitis   . Cervical spondylosis without myelopathy   . Depression   . Diabetes mellitus   . Esophageal stricture   . Fibromyalgia   . GERD (gastroesophageal reflux disease)   . Hiatal hernia   . Hyperlipidemia   . Hypertension   . Myalgia   . Pain in joint, upper arm    There were no vitals taken for this visit.  Opioid Risk Score:   Fall Risk Score:  `1  Depression screen PHQ 2/9  Depression screen [facility] 2/9 04/08/2016 10/08/2015 11/21/2014  Decreased Interest 2 2 2   Down, Depressed, Hopeless 0 0 0  PHQ - 2  Score 2 2 2   Altered sleeping - - 2  Tired, decreased energy - - 2  Change in appetite - - 1  Feeling bad or failure about yourself  - - 0  Trouble concentrating - - 1  Moving slowly or fidgety/restless - - 0  Suicidal thoughts - - 0  PHQ-9 Score - - 8    Review of Systems  Constitutional: Negative.   HENT: Negative.   Eyes: Negative.   Respiratory: Positive for apnea.   Cardiovascular: Negative.   Gastrointestinal: Negative.   Endocrine:       High blood sugar Low blood sugar  Genitourinary: Negative.   Musculoskeletal: Negative.   Skin: Negative.   Allergic/Immunologic: Negative.   Neurological: Negative.   Hematological: Negative.   Psychiatric/Behavioral: Negative.   All other systems reviewed and are negative.      Objective:   Physical Exam  Constitutional: She is oriented to person, place, and time. She appears well-developed and well-nourished. She has gained a little weight.  HENT:  Head: Normocephalic and atraumatic.  Eyes: PERRL  Neck: fair cervical ROM.  Cardiovascular: RRR Pulmonary/Chest: Breath sounds normal. No respiratory distress. She has no wheezes.  Abdominal: Soft. Bowel sounds are normal. She exhibits no distension.  Musculoskeletal: left shoulder pain with IR/ER. TP at left infraspinatus. ---rotation of shoulder triggered pain in shoulder and arm. Neurological: She is alert and oriented to person, place, and time.  No sensory deficit. DTR's are 2+ in both ue's. Motor exam and sensory exam normal. No sensory findings. Tinel's negative/Phalen's negative bilaterally Psychiatric: She has a normal mood and affect but perhaps a little flat.Marland Kitchen Her behavior is normal. Judgment and thought content normal. Mood is pleasant   Assessment & Plan:  ASSESSMENT:  1. Fibromyalgia with myofascial pain. -left scapula/infraspinatus today 2. Rotator cuff syndrome on the right.  3. History of repair was with chronic pain.  4. Cervicalgia. Post-laminectomy  syndrome, facet arthropathy symptoms, associated myofascial pain and headaches. Symptoms improved after translaminar C7-T1 ESI (right). Persistent myofascial pain. See no signs of radiculopathy on exam.  5. Depression- which has improved  7. Mid/low back pain--  xrays of lumbar spine from 2014 show DDD in the upper lumbar segements. Anterior sendysmosis at L2-3--- AS?  8. Decreased bone density  10. Trigger finger left middle   PLAN:  1. Discussed left shoulder and scapular ROM. Can work on massage at home also. Encouraged her that I see no neurological signs on exam. Her arm symptom are likely referred myofascial pain. There could be a mild radiculitis. 2. Hydrocodone 5/325 one-half to one p.o. B.i.d prn. #60--  RF today 3. After informed consent and preparation of the skin with isopropyl alcohol, I injected the left infraspinatus with 2cc of 1% lidocaine. The patient tolerated well, and no  complications were experienced. Post-injection instructions were provided. 4. Follow up with me in about 6 weeks. 15 minutes of face to face patient care time were spent during this visit. All questions were encouraged and answered.

## 2016-07-14 NOTE — Patient Instructions (Addendum)
WORK ON YOUR SHOULDER AND NECK RANGE OF MOTION DAILY   PLEASE CALL ME WITH ANY PROBLEMS OR QUESTIONS [id number])   HAPPY HOLIDAYS!!!!                    *                * *             *   *   *         *  *   *  *  *     *  *  *  *  *  *  * *  *  *  *  *  *  *  *  *  * *               *  *               *  *               *  *

## 2016-07-20 ENCOUNTER — Other Ambulatory Visit: Payer: Self-pay | Admitting: Physical Medicine & Rehabilitation

## 2016-08-18 ENCOUNTER — Encounter: Payer: Medicaid Other | Attending: Physical Medicine and Rehabilitation | Admitting: Registered Nurse

## 2016-08-18 ENCOUNTER — Encounter: Payer: Self-pay | Admitting: Registered Nurse

## 2016-08-18 VITALS — BP 133/87 | HR 83

## 2016-08-18 DIAGNOSIS — M545 Low back pain, unspecified: Secondary | ICD-10-CM

## 2016-08-18 DIAGNOSIS — M797 Fibromyalgia: Secondary | ICD-10-CM | POA: Diagnosis not present

## 2016-08-18 DIAGNOSIS — M609 Myositis, unspecified: Secondary | ICD-10-CM | POA: Insufficient documentation

## 2016-08-18 DIAGNOSIS — M4716 Other spondylosis with myelopathy, lumbar region: Secondary | ICD-10-CM

## 2016-08-18 DIAGNOSIS — Z5181 Encounter for therapeutic drug level monitoring: Secondary | ICD-10-CM

## 2016-08-18 DIAGNOSIS — M961 Postlaminectomy syndrome, not elsewhere classified: Secondary | ICD-10-CM

## 2016-08-18 DIAGNOSIS — F3289 Other specified depressive episodes: Secondary | ICD-10-CM

## 2016-08-18 DIAGNOSIS — M7918 Myalgia, other site: Secondary | ICD-10-CM

## 2016-08-18 DIAGNOSIS — Z79899 Other long term (current) drug therapy: Secondary | ICD-10-CM

## 2016-08-18 DIAGNOSIS — M791 Myalgia: Secondary | ICD-10-CM

## 2016-08-18 DIAGNOSIS — G894 Chronic pain syndrome: Secondary | ICD-10-CM

## 2016-08-18 MED ORDER — HYDROCODONE-ACETAMINOPHEN 5-325 MG PO TABS: 1.0000 | ORAL_TABLET | Freq: Three times a day (TID) | ORAL | 0 refills | Status: DC | PRN

## 2016-08-18 NOTE — Addendum Note (Signed)
Addended by: Marland Mcalpine B on: 08/18/2016 11:17 AM   Modules accepted: Orders

## 2016-08-18 NOTE — Progress Notes (Signed)
Subjective:    Patient ID: Robin Morgan, female    DOB: 02-16-1956, 61 y.o.   MRN: [id number]  HPI: [name] is a 61 year old female who returns for follow up appointment for chronic pain and medication refill. She states her pain is located in her left arm radiating into left hand and mid-back. Also states her mid-back pain has increased in intensity the last few months, we will increase her hydrocodone tablets this month, she verbalizes understanding. She rates her pain 8. Her current exercise regime is walking and performing stretching exercises.  Pain Inventory Average Pain 9 Pain Right Now 8 My pain is constant and tingling  In the last 24 hours, has pain interfered with the following? General activity 8 Relation with others 7 Enjoyment of life 8 What TIME of day is your pain at its worst? morning Sleep (in general) Poor  Pain is worse with: sitting and standing Pain improves with: medication Relief from Meds: 7  Mobility walk without assistance ability to climb steps?  yes do you drive?  yes  Function disabled: date disabled .  Neuro/Psych numbness spasms  Prior Studies Any changes since last visit?  no  Physicians involved in your care Any changes since last visit?  no   Family History  Problem Relation Age of Onset  . Alzheimer's disease Mother   . Diabetes Father   . Heart disease Maternal Grandmother    Social History   Social History  . Marital status: Divorced    Spouse name: [name]  . Number of children: 1  . Years of education: [name]   Social History Main Topics  . Smoking status: Never Smoker  . Smokeless tobacco: Never Used  . Alcohol use No  . Drug use: No  . Sexual activity: Not on file   Other Topics Concern  . Not on file   Social History Narrative  . No narrative on file   Past Surgical History:  Procedure Laterality Date  . ABDOMINAL HYSTERECTOMY    . APPENDECTOMY    . BREAST SURGERY     rt breast cyst  .  CHOLECYSTECTOMY    . ROTATOR CUFF REPAIR     right  . SPINE SURGERY    . TONSILLECTOMY     Past Medical History:  Diagnosis Date  . Calcific tendonitis   . Cervical spondylosis without myelopathy   . Depression   . Diabetes mellitus   . Esophageal stricture   . Fibromyalgia   . GERD (gastroesophageal reflux disease)   . Hiatal hernia   . Hyperlipidemia   . Hypertension   . Myalgia   . Pain in joint, upper arm    There were no vitals taken for this visit.  Opioid Risk Score:   Fall Risk Score:  `1  Depression screen PHQ 2/9  Depression screen [facility] 2/9 04/08/2016 10/08/2015 11/21/2014  Decreased Interest 2 2 2   Down, Depressed, Hopeless 0 0 0  PHQ - 2 Score 2 2 2   Altered sleeping - - 2  Tired, decreased energy - - 2  Change in appetite - - 1  Feeling bad or failure about yourself  - - 0  Trouble concentrating - - 1  Moving slowly or fidgety/restless - - 0  Suicidal thoughts - - 0  PHQ-9 Score - - 8   Review of Systems  Constitutional: Negative.   HENT: Negative.   Eyes: Negative.   Respiratory: Negative.   Cardiovascular: Negative.  Gastrointestinal: Negative.   Endocrine: Negative.   Genitourinary: Negative.   Musculoskeletal:       Spasms  Skin: Negative.   Allergic/Immunologic: Negative.   Neurological: Positive for numbness.  Hematological: Negative.   Psychiatric/Behavioral: Negative.   All other systems reviewed and are negative.      Objective:   Physical Exam  Constitutional: She is oriented to person, place, and time. She appears well-developed and well-nourished.  HENT:  Head: Normocephalic and atraumatic.  Neck: Normal range of motion. Neck supple.  Cardiovascular: Normal rate and regular rhythm.   Pulmonary/Chest: Effort normal and breath sounds normal.  Musculoskeletal:  Normal Muscle Bulk and Muscle Testing Reveals: Upper Extremities: Full ROM and Muscle Strength 5/5 Thoracic Paraspinal Tenderness: T-1-T-3 T-10-T-12 Lower Extremities:  Full ROM and Muscle Strength 5/5 Arises from Table with ease Narrow Based Gait  Neurological: She is alert and oriented to person, place, and time.  Skin: Skin is warm and dry.  Psychiatric: She has a normal mood and affect.  Nursing note and vitals reviewed.         Assessment & Plan:  1. Fibromyalgia with myofascial pain: Continue exercise and heat therapy.  2. Rotator cuff syndrome on the right: Continue with stretching exercises and heat therapy.  3. Cervicalgia. Post-laminectomy syndrome, facet arthropathy: Continue Voltaren gel. 4. Depression: Continue Cymbalta 5. Mid/low back pain: Continue Current Medication and stretching and heat therapy.  6 OA of right hand: No complaints voiced. Continue using Voltaren gel and heat therapy  7. Migraines: Continue Hydrocodone 5/325 mg one tablet every 8 hours as needed. Increase #80.   20 minutes of face to face patient care time was spent during this visit. All questions were encouraged and answered.   F/U in 1 month

## 2016-08-24 LAB — TOXASSURE SELECT,+ANTIDEPR,UR

## 2016-08-27 ENCOUNTER — Telehealth: Payer: Self-pay | Admitting: Registered Nurse

## 2016-08-27 NOTE — Telephone Encounter (Signed)
On January 18,2018 NCCSR was reviewed: No conflict was seen on the [place] with Multiple Pres[employer]. Ms. Hoglen a signed Narcotic Contract with our office. If there were any discrepancies this would have beenreported to her Physcian.

## 2016-08-31 ENCOUNTER — Encounter: Payer: Self-pay | Admitting: Physical Medicine & Rehabilitation

## 2016-08-31 ENCOUNTER — Encounter (HOSPITAL_BASED_OUTPATIENT_CLINIC_OR_DEPARTMENT_OTHER): Payer: Medicaid Other | Admitting: Physical Medicine & Rehabilitation

## 2016-08-31 VITALS — BP 121/74 | HR 96 | Resp 14

## 2016-08-31 DIAGNOSIS — M7918 Myalgia, other site: Secondary | ICD-10-CM

## 2016-08-31 DIAGNOSIS — M4716 Other spondylosis with myelopathy, lumbar region: Secondary | ICD-10-CM | POA: Diagnosis not present

## 2016-08-31 DIAGNOSIS — M961 Postlaminectomy syndrome, not elsewhere classified: Secondary | ICD-10-CM | POA: Diagnosis not present

## 2016-08-31 DIAGNOSIS — M797 Fibromyalgia: Secondary | ICD-10-CM

## 2016-08-31 DIAGNOSIS — M791 Myalgia: Secondary | ICD-10-CM

## 2016-08-31 NOTE — Patient Instructions (Signed)
PLEASE FEEL FREE TO CALL OUR OFFICE WITH ANY PROBLEMS OR QUESTIONS ([phone])      

## 2016-08-31 NOTE — Progress Notes (Signed)
Subjective:    Patient ID: Robin Morgan, female    DOB: 1956-05-28, 61 y.o.   MRN: [id number]  HPI   Robin Morgan is here today regarding new onset back pain which began last week. The pain came on without obvious cause. The pain radiates down both legs, right more than left, but doesn't cross the knee. She denies frank numbness or weakness in either leg. The legs are both sensitive to touch. It hurts more if she lays on her right side. She hasn't found anything which really relieves the pain including her hydrocodone.     Pain Inventory Average Pain 8 Pain Right Now 8 My pain is constant and aching  In the last 24 hours, has pain interfered with the following? General activity 9 Relation with others 7 Enjoyment of life 7 What TIME of day is your pain at its worst? all Sleep (in general) Fair  Pain is worse with: walking and standing Pain improves with: medication Relief from Meds: 8  Mobility ability to climb steps?  yes do you drive?  yes  Function disabled: date disabled .  Neuro/Psych numbness  Prior Studies Any changes since last visit?  no  Physicians involved in your care Any changes since last visit?  no   Family History  Problem Relation Age of Onset  . Alzheimer's disease Mother   . Diabetes Father   . Heart disease Maternal Grandmother    Social History   Social History  . Marital status: Divorced    Spouse name: [name]  . Number of children: 1  . Years of education: [name]   Social History Main Topics  . Smoking status: Never Smoker  . Smokeless tobacco: Never Used  . Alcohol use No  . Drug use: No  . Sexual activity: Not Asked   Other Topics Concern  . None   Social History Narrative  . None   Past Surgical History:  Procedure Laterality Date  . ABDOMINAL HYSTERECTOMY    . APPENDECTOMY    . BREAST SURGERY     rt breast cyst  . CHOLECYSTECTOMY    . ROTATOR CUFF REPAIR     right  . SPINE SURGERY    . TONSILLECTOMY     Past  Medical History:  Diagnosis Date  . Calcific tendonitis   . Cervical spondylosis without myelopathy   . Depression   . Diabetes mellitus   . Esophageal stricture   . Fibromyalgia   . GERD (gastroesophageal reflux disease)   . Hiatal hernia   . Hyperlipidemia   . Hypertension   . Myalgia   . Pain in joint, upper arm    BP 121/74   Pulse 96   Resp 14   SpO2 97%   Opioid Risk Score:   Fall Risk Score:  `1  Depression screen PHQ 2/9  Depression screen [facility] 2/9 04/08/2016 10/08/2015 11/21/2014  Decreased Interest 2 2 2   Down, Depressed, Hopeless 0 0 0  PHQ - 2 Score 2 2 2   Altered sleeping - - 2  Tired, decreased energy - - 2  Change in appetite - - 1  Feeling bad or failure about yourself  - - 0  Trouble concentrating - - 1  Moving slowly or fidgety/restless - - 0  Suicidal thoughts - - 0  PHQ-9 Score - - 8    Review of Systems  Constitutional: Negative.   HENT: Negative.   Eyes: Negative.   Respiratory: Negative.   Cardiovascular: Negative.  Gastrointestinal: Negative.   Endocrine: Negative.   Genitourinary: Negative.   Musculoskeletal: Positive for arthralgias, back pain and myalgias.  Skin: Negative.   Allergic/Immunologic: Negative.   Neurological: Positive for numbness.  Hematological: Negative.   Psychiatric/Behavioral: Negative.   All other systems reviewed and are negative.      Objective:   Physical Exam  Constitutional: She is oriented to person, place, and time. She appears well-developed and well-nourished. She has gained a little weight.  HENT:  Head: Normocephalic and atraumatic.  Eyes: PERRL  Neck: fair cervical ROM.  Cardiovascular: RRR Pulmonary/Chest: Breath sounds normal. No respiratory distress. She has no wheezes.  Abdominal: Soft. Bowel sounds are normal. She exhibits no distension.  Musculoskeletal: Upper lumbar paraspinals taut, right more than left in the upper lumbar levels. Mild kyphosis/head forward position. Flexion and  extension did cause some pain. Area TTP as well.  No sensory deficit. DTR's are 2+ in both ue's. Motor exam and sensory exam normal. No sensory findings. Tinel's negative/Phalen's equivocal LLE.  Psychiatric: She has a normal mood and affect but perhaps a little flat.Marland Kitchen Her behavior is normal. Judgment and thought content normal. Mood is pleasant   Assessment & Plan:  ASSESSMENT:  1. Fibromyalgia with myofascial pain. -left scapula/infraspinatus today 2. Rotator cuff syndrome on the right.  3. History of repair was with chronic pain.  4. Cervicalgia. Post-laminectomy syndrome, facet arthropathy symptoms, associated myofascial pain and headaches. Symptoms improved after translaminar C7-T1 ESI (right). Persistent myofascial pain. See no signs of radiculopathy on exam.  5. Depression- which has improved  7. Mid/low back pain-- xrays of lumbar spine from 2014 show DDD in the upper lumbar segements. Anterior sendysmosisat L2-3--- AS?   -recurrent pain, likely myofascial component too.  8. Decreased bone density  10. Trigger finger left middle   PLAN:  1. May need to follow up films as her upper lumbar spine may ultimately be the source of her increased pain  2. Hydrocodone 5/325 one-half to one p.o. B.i.d prn. #60 3. After informed consent and preparation of the skin with isopropyl alcohol, I injected the right (2) and left lumbar paraspinals(1),  (3 in total) with 2cc of 1% lidocaine. The patient tolerated well, and no complications were experienced. Post-injection instructions were provided. 4. Follow up with me in about a month as scheduled. 15 minutes of face to face patient care time were spent during this visit. All questions were encouraged and answered.

## 2016-09-01 NOTE — Progress Notes (Signed)
Urine drug screen for this encounter is consistent for prescribed medication 

## 2016-09-07 ENCOUNTER — Telehealth: Payer: Self-pay | Admitting: Physical Medicine & Rehabilitation

## 2016-09-07 DIAGNOSIS — M4716 Other spondylosis with myelopathy, lumbar region: Secondary | ICD-10-CM

## 2016-09-07 NOTE — Telephone Encounter (Signed)
Patient called office and states that the injections last week did not help - you had mentioned an Xray - wanted to advise you if she needs to have it scheduled.

## 2016-09-07 NOTE — Telephone Encounter (Signed)
I have ordered an MRI of her lumbar spine. [facility]

## 2016-09-08 NOTE — Telephone Encounter (Signed)
[name] notified and [facility] has already contacted her.

## 2016-09-11 ENCOUNTER — Other Ambulatory Visit: Payer: Medicaid Other

## 2016-09-22 ENCOUNTER — Encounter: Payer: Medicaid Other | Admitting: Registered Nurse

## 2016-09-22 ENCOUNTER — Encounter: Payer: Self-pay | Admitting: Physical Medicine & Rehabilitation

## 2016-09-22 ENCOUNTER — Encounter: Payer: Medicaid Other | Attending: Physical Medicine and Rehabilitation | Admitting: Physical Medicine & Rehabilitation

## 2016-09-22 VITALS — BP 120/72 | HR 86 | Resp 14

## 2016-09-22 DIAGNOSIS — M5416 Radiculopathy, lumbar region: Secondary | ICD-10-CM

## 2016-09-22 DIAGNOSIS — M609 Myositis, unspecified: Secondary | ICD-10-CM | POA: Diagnosis present

## 2016-09-22 DIAGNOSIS — M791 Myalgia: Secondary | ICD-10-CM | POA: Diagnosis not present

## 2016-09-22 DIAGNOSIS — M961 Postlaminectomy syndrome, not elsewhere classified: Secondary | ICD-10-CM | POA: Diagnosis present

## 2016-09-22 DIAGNOSIS — M4716 Other spondylosis with myelopathy, lumbar region: Secondary | ICD-10-CM

## 2016-09-22 DIAGNOSIS — M1990 Unspecified osteoarthritis, unspecified site: Secondary | ICD-10-CM

## 2016-09-22 DIAGNOSIS — M7918 Myalgia, other site: Secondary | ICD-10-CM

## 2016-09-22 DIAGNOSIS — M1380 Other specified arthritis, unspecified site: Secondary | ICD-10-CM | POA: Insufficient documentation

## 2016-09-22 MED ORDER — HYDROCODONE-ACETAMINOPHEN 5-325 MG PO TABS: 1.0000 | ORAL_TABLET | Freq: Four times a day (QID) | ORAL | 0 refills | Status: DC | PRN

## 2016-09-22 NOTE — Patient Instructions (Signed)
PLEASE FEEL FREE TO CALL OUR OFFICE WITH ANY PROBLEMS OR QUESTIONS ([phone])      

## 2016-09-22 NOTE — Progress Notes (Signed)
Subjective:    Patient ID: Robin Morgan, female    DOB: [birth year], 61 y.o.   MRN: [id number]  HPI   Robin Morgan is back regarding her persistent back pain. It continues in her low to mid back with radiation to her right leg to her calf. She is unable to sleep. None of her pain medications are helping. Her right low back and hip tend to bother her a lot when she bears weight on her right leg. She is also concerned about some burning pain in her hands particulalry along the thumb and index fingers.   Her hydrocodone was increased to #80 at last visit to help with some of her pain. She is a little confused as to when to take the medication given the q8hr prn schedule (ie, it's too early to take again at bed time).   I ordered an MRI of her lumbar spine on 1/29 but it has not been set up yet. Not sure why.    Pain Inventory Average Pain 9 Pain Right Now 8 My pain is constant, stabbing and aching  In the last 24 hours, has pain interfered with the following? General activity 10 Relation with others 9 Enjoyment of life 10 What TIME of day is your pain at its worst? all Sleep (in general) Poor  Pain is worse with: walking and standing Pain improves with: medication Relief from Meds: 6  Mobility walk without assistance do you drive?  yes  Function disabled: date disabled . Do you have any goals in this area?  no  Neuro/Psych spasms  Prior Studies Any changes since last visit?  no  Physicians involved in your care Any changes since last visit?  no   Family History  Problem Relation Age of Onset  . Alzheimer's disease Mother   . Diabetes Father   . Heart disease Maternal Grandmother    Social History   Social History  . Marital status: Divorced    Spouse name: [name]  . Number of children: 1  . Years of education: [name]   Social History Main Topics  . Smoking status: Never Smoker  . Smokeless tobacco: Never Used  . Alcohol use No  . Drug use: No  . Sexual  activity: Not Asked   Other Topics Concern  . None   Social History Narrative  . None   Past Surgical History:  Procedure Laterality Date  . ABDOMINAL HYSTERECTOMY    . APPENDECTOMY    . BREAST SURGERY     rt breast cyst  . CHOLECYSTECTOMY    . ROTATOR CUFF REPAIR     right  . SPINE SURGERY    . TONSILLECTOMY     Past Medical History:  Diagnosis Date  . Calcific tendonitis   . Cervical spondylosis without myelopathy   . Depression   . Diabetes mellitus   . Esophageal stricture   . Fibromyalgia   . GERD (gastroesophageal reflux disease)   . Hiatal hernia   . Hyperlipidemia   . Hypertension   . Myalgia   . Pain in joint, upper arm    BP 120/72   Pulse 86   Resp 14   SpO2 94%   Opioid Risk Score:   Fall Risk Score:  `1  Depression screen PHQ 2/9  Depression screen [facility] 2/9 04/08/2016 10/08/2015 11/21/2014  Decreased Interest 2 2 2   Down, Depressed, Hopeless 0 0 0  PHQ - 2 Score 2 2 2   Altered sleeping - - 2  Tired, decreased energy - - 2  Change in appetite - - 1  Feeling bad or failure about yourself  - - 0  Trouble concentrating - - 1  Moving slowly or fidgety/restless - - 0  Suicidal thoughts - - 0  PHQ-9 Score - - 8    Review of Systems  Constitutional: Negative.   HENT: Negative.   Eyes: Negative.   Respiratory: Negative.   Cardiovascular: Negative.   Gastrointestinal: Positive for nausea.  Endocrine: Negative.   Genitourinary: Negative.   Musculoskeletal: Positive for back pain.       Spasms  Skin: Negative.   Allergic/Immunologic: Negative.   Neurological: Negative.   Hematological: Negative.   Psychiatric/Behavioral: Negative.   All other systems reviewed and are negative.      Objective:   Physical Exam  Constitutional: She is oriented to person, place, and time. She appears well-developed and well-nourished. She has gained a little weight.  HENT:  Head: Normocephalic and atraumatic.  Eyes: PERRL Neck: fair cervical ROM.    Cardiovascular: RRR Pulmonary/Chest: CTA B.  Abdominal: Soft. Bowel sounds are normal. She exhibits no distension.  Musculoskeletal: Upper lumbar paraspinals taut, right more than left in the upper lumbar levels. Mild kyphosis/head forward position. Flexion and extension continue to cause pain. Limited with all planes of lumbar movement. . Lumbar spine and buttocks TTP as well. Gluteal muscles are quite tight. Difficulty with SLR and FABER due to glute resistance. SLR testing did produce pain down the right leg. Fingers with chronic OA changes. No joint warmth or effusions are noted.  Neurology:No sensory deficit. DTR's are 2+ in both ue's. 1+ in the LE's although she has difficulties relaxing.  Motor exam and sensory exam normal. No sensory findings. Psychiatric: She is flat  Assessment & Plan:  ASSESSMENT:  1. Fibromyalgia with myofascial pain. - 2. Rotator cuff syndrome on the right.  3. History of repair was with chronic pain.  4. Cervicalgia. Post-laminectomy syndrome, facet arthropathy symptoms, associated myofascial pain and headaches. Symptoms improved after translaminar C7-T1 ESI (right). Persistent myofascial pain. See no signs of radiculopathy on exam.  5. Depression- which has improved  7. Mid/low back pain-- xrays of lumbar spine from 2014 show DDD in the upper lumbar segements. Anterior sendysmosisat L2-3--- AS?              -concerned by worsening back pain and generalized pain. Although some of the diffuse pain could be related to FMS , given the previous xray findings, other systemic, inflammatory disease should be considered.   -have ordered MRI to assess lumbar spine.  -will order testing to rule out inflammatory arthritis, AS, SLE 8. Decreased bone density  10. Trigger finger left middle   PLAN:   1. Will order testing to rule out inflammatory arthritis, AS, SLE 2. Hydrocodone 5/325 one-half to one p.o. TID prn #80 3. Will order testing to rule out inflammatory  arthritis, AS, SLE 4. Follow up with me in about a month as scheduled. 25 minutes of face to face patient care time were spent during this visit. All questions were encouraged and answered. Greater than 50% of time during this encounter was spent counseling patient/family in regard to pain source,work up etc. .

## 2016-09-24 ENCOUNTER — Ambulatory Visit
Admission: RE | Admit: 2016-09-24 | Discharge: 2016-09-24 | Disposition: A | Discharge status: Home / Self Care | Payer: Medicaid Other | Source: Ambulatory Visit | Attending: Physical Medicine & Rehabilitation | Admitting: Physical Medicine & Rehabilitation

## 2016-09-24 DIAGNOSIS — M4716 Other spondylosis with myelopathy, lumbar region: Secondary | ICD-10-CM

## 2016-09-25 ENCOUNTER — Telehealth: Payer: Self-pay | Admitting: Physical Medicine & Rehabilitation

## 2016-09-25 NOTE — Telephone Encounter (Signed)
Called Robin Morgan. Reviewed results of lumbar MRI and available labs. Will follow up further with her at next OV. Gave isntructions for stretches/HEP for now.

## 2016-09-29 LAB — COMPREHENSIVE METABOLIC PANEL
ALBUMIN: 4.7 g/dL (ref 3.6–4.8)
ALK PHOS: 54 IU/L (ref 39–117)
ALT: 36 IU/L — ABNORMAL HIGH (ref 0–32)
AST: 36 IU/L (ref 0–40)
Albumin/Globulin Ratio: 1.9 (ref 1.2–2.2)
BUN / CREAT RATIO: 19 (ref 12–28)
BUN: 18 mg/dL (ref 8–27)
Bilirubin Total: 0.4 mg/dL (ref 0.0–1.2)
CALCIUM: 10.2 mg/dL (ref 8.7–10.3)
CO2: 24 mmol/L (ref 18–29)
CREATININE: 0.97 mg/dL (ref 0.57–1.00)
Chloride: 99 mmol/L (ref 96–106)
GFR calc Af Amer: 72 mL/min/{1.73_m2} (ref >59)
GFR, EST NON AFRICAN AMERICAN: 62 mL/min/{1.73_m2} (ref >59)
GLOBULIN, TOTAL: 2.5 g/dL (ref 1.5–4.5)
Glucose: 93 mg/dL (ref 65–99)
Potassium: 4.4 mmol/L (ref 3.5–5.2)
Sodium: 140 mmol/L (ref 134–144)
Total Protein: 7.2 g/dL (ref 6.0–8.5)

## 2016-09-29 LAB — CBC
HEMATOCRIT: 41.2 % (ref 34.0–46.6)
HEMOGLOBIN: 13.3 g/dL (ref 11.1–15.9)
MCH: 27.8 pg (ref 26.6–33.0)
MCHC: 32.3 g/dL (ref 31.5–35.7)
MCV: 86 fL (ref 79–97)
Platelets: 419 10*3/uL — ABNORMAL HIGH (ref 150–379)
RBC: 4.78 x10E6/uL (ref 3.77–5.28)
RDW: 14.1 % (ref 12.3–15.4)
WBC: 9.1 10*3/uL (ref 3.4–10.8)

## 2016-09-29 LAB — SEDIMENTATION RATE: SED RATE: 7 mm/h (ref 0–40)

## 2016-09-29 LAB — RHEUMATOID FACTOR: Rhuematoid fact SerPl-aCnc: <10 IU/mL (ref 0.0–13.9)

## 2016-09-29 LAB — MAGNESIUM: MAGNESIUM: 1.4 mg/dL — AB (ref 1.6–2.3)

## 2016-09-29 LAB — HLA-B27 ANTIGEN: HLA B27: POSITIVE

## 2016-09-29 LAB — ANA: Anti Nuclear Antibody(ANA): NEGATIVE

## 2016-10-19 ENCOUNTER — Telehealth: Payer: Self-pay | Admitting: Physical Medicine & Rehabilitation

## 2016-10-19 NOTE — Telephone Encounter (Signed)
Patient needs a prior approval from medicaid on her hydrocodone.

## 2016-10-19 NOTE — Telephone Encounter (Signed)
Prior auth for hydrocodone 5/325 # 80 initiated with [place] [employer]

## 2016-10-20 ENCOUNTER — Encounter: Payer: Medicaid Other | Admitting: Registered Nurse

## 2016-10-20 NOTE — Telephone Encounter (Addendum)
Hydrocodone Prior Approval #: [id number]  Confirmation #:[id number]  10/19/16-04/17/17  [name] notified.

## 2016-10-22 ENCOUNTER — Other Ambulatory Visit: Payer: Self-pay | Admitting: Physical Medicine & Rehabilitation

## 2016-10-23 NOTE — Telephone Encounter (Signed)
Patient requested a refill of this medication, no mention to continue this medication in last few notes, please advise

## 2016-11-04 ENCOUNTER — Encounter: Payer: Self-pay | Admitting: Registered Nurse

## 2016-11-04 ENCOUNTER — Encounter: Payer: Medicaid Other | Attending: Physical Medicine and Rehabilitation | Admitting: Registered Nurse

## 2016-11-04 VITALS — BP 117/72 | HR 99

## 2016-11-04 DIAGNOSIS — M545 Low back pain, unspecified: Secondary | ICD-10-CM

## 2016-11-04 DIAGNOSIS — G894 Chronic pain syndrome: Secondary | ICD-10-CM

## 2016-11-04 DIAGNOSIS — Z5181 Encounter for therapeutic drug level monitoring: Secondary | ICD-10-CM

## 2016-11-04 DIAGNOSIS — Z79899 Other long term (current) drug therapy: Secondary | ICD-10-CM

## 2016-11-04 DIAGNOSIS — F3289 Other specified depressive episodes: Secondary | ICD-10-CM

## 2016-11-04 DIAGNOSIS — M797 Fibromyalgia: Secondary | ICD-10-CM

## 2016-11-04 DIAGNOSIS — M791 Myalgia: Secondary | ICD-10-CM | POA: Diagnosis present

## 2016-11-04 DIAGNOSIS — M961 Postlaminectomy syndrome, not elsewhere classified: Secondary | ICD-10-CM | POA: Diagnosis present

## 2016-11-04 DIAGNOSIS — M7918 Myalgia, other site: Secondary | ICD-10-CM

## 2016-11-04 DIAGNOSIS — M609 Myositis, unspecified: Secondary | ICD-10-CM | POA: Insufficient documentation

## 2016-11-04 MED ORDER — HYDROCODONE-ACETAMINOPHEN 5-325 MG PO TABS: 1.0000 | ORAL_TABLET | Freq: Four times a day (QID) | ORAL | 0 refills | Status: DC | PRN

## 2016-11-04 MED ORDER — GABAPENTIN 300 MG PO CAPS: 300.0000 mg | ORAL_CAPSULE | Freq: Three times a day (TID) | ORAL | 1 refills | Status: DC

## 2016-11-04 NOTE — Progress Notes (Signed)
Subjective:    Patient ID: Robin Morgan, female    DOB: [birth year], 61 y.o.   MRN: [id number]  HPI: [name] is a 61year old female who returns for follow up appointmentfor chronic pain and medication refill. She statesher pain is located in her lower back radiating into her right hip and right lower extremity. Also states her PCP increased her gabapentin she's taking one capsule in the morning, lunch and two capsule at HS  She rates her pain 8. Her current exercise regime is walking and performing stretching exercises. Also states she discussed being depressed with PCP and her Cymbalta was increased to 90 mg. We discussed counseling at this time she refuses, denies suicidal thoughts. She will thing about counseling and if she decides a referral will be placed for Dr. Sima Matas. She verbalizes understanding. Also encouraged ti increase outside activity and HEP, she verbalizes understanding.    Pain Inventory Average Pain 8 Pain Right Now 8 My pain is aching  In the last 24 hours, has pain interfered with the following? General activity 10 Relation with others 10 Enjoyment of life 10 What TIME of day is your pain at its worst? all Sleep (in general) Poor  Pain is worse with: walking and standing Pain improves with: rest and medication Relief from Meds: 8  Mobility walk without assistance ability to climb steps?  yes do you drive?  yes  Function disabled: date disabled .  Neuro/Psych depression  Prior Studies Any changes since last visit?  no  Physicians involved in your care Any changes since last visit?  no   Family History  Problem Relation Age of Onset  . Alzheimer's disease Mother   . Diabetes Father   . Heart disease Maternal Grandmother    Social History   Social History  . Marital status: Divorced    Spouse name: [name]  . Number of children: 1  . Years of education: [name]   Social History Main Topics  . Smoking status: Never Smoker  .  Smokeless tobacco: Never Used  . Alcohol use No  . Drug use: No  . Sexual activity: Not Asked   Other Topics Concern  . None   Social History Narrative  . None   Past Surgical History:  Procedure Laterality Date  . ABDOMINAL HYSTERECTOMY    . APPENDECTOMY    . BREAST SURGERY     rt breast cyst  . CHOLECYSTECTOMY    . ROTATOR CUFF REPAIR     right  . SPINE SURGERY    . TONSILLECTOMY     Past Medical History:  Diagnosis Date  . Calcific tendonitis   . Cervical spondylosis without myelopathy   . Depression   . Diabetes mellitus   . Esophageal stricture   . Fibromyalgia   . GERD (gastroesophageal reflux disease)   . Hiatal hernia   . Hyperlipidemia   . Hypertension   . Myalgia   . Pain in joint, upper arm    BP 117/72 (BP Location: Right Arm, Patient Position: Sitting, Cuff Size: Normal)   Pulse 99   SpO2 95%   Opioid Risk Score:   Fall Risk Score:  `1  Depression screen PHQ 2/9  Depression screen [facility] 2/9 04/08/2016 10/08/2015 11/21/2014  Decreased Interest 2 2 2   Down, Depressed, Hopeless 0 0 0  PHQ - 2 Score 2 2 2   Altered sleeping - - 2  Tired, decreased energy - - 2  Change in appetite - -  1  Feeling bad or failure about yourself  - - 0  Trouble concentrating - - 1  Moving slowly or fidgety/restless - - 0  Suicidal thoughts - - 0  PHQ-9 Score - - 8    Review of Systems  Constitutional: Negative.   HENT: Negative.   Eyes: Negative.   Respiratory: Negative.   Cardiovascular: Negative.   Gastrointestinal: Negative.   Endocrine: Negative.   Genitourinary: Negative.   Musculoskeletal: Positive for arthralgias, back pain and myalgias.  Skin: Negative.   Allergic/Immunologic: Negative.   Neurological: Negative.   Hematological: Negative.   Psychiatric/Behavioral: Positive for dysphoric mood.  All other systems reviewed and are negative.      Objective:   Physical Exam  Constitutional: She is oriented to person, place, and time. She appears  well-developed and well-nourished.  HENT:  Head: Normocephalic and atraumatic.  Neck: Normal range of motion. Neck supple.  Cardiovascular: Normal rate and regular rhythm.   Pulmonary/Chest: Effort normal and breath sounds normal.  Musculoskeletal:  Normal Muscle Bulk and Muscle Testing Reveals: Upper Extremities: Full ROM and Muscle Strength 5/5 Back without spinal Tenderness Right Greater Trochanter tenderness Lower Extremities: Full ROM and Muscle Strength 5/5 Arises from Table with ease Narrow Based gait  Neurological: She is alert and oriented to person, place, and time.  Skin: Skin is warm and dry.  Psychiatric: She has a normal mood and affect.  Nursing note and vitals reviewed.         Assessment & Plan:  1. Fibromyalgia with myofascial pain: Continue exercise and heat therapy. 11/04/2016 2. Rotator cuff syndrome on the right: Continue with stretching exercises and heat therapy. 11/04/2016 3. Cervicalgia. Post-laminectomy syndrome, facet arthropathy: Continue Voltaren gel. 11/04/2016 4. Depression: Continue Cymbalta. PCP increase dose to 90 mg daily. 11/04/2016 5. Mid/low back pain: No complaints today. Continue Current Medication and stretching and heat therapy. 11/04/2016 6 OA of right hand: No complaints voiced. Continue using Voltaren gel and heat therapy. 11/04/2016 7. Migraines: Continue Hydrocodone 5/325 mg one tablet every 8 hours as needed.  #80. 11/04/2016  F/U in 1 month  20 minutes of face to face patient care time was spent during this visit. All questions were encouraged and answered.

## 2016-11-17 ENCOUNTER — Other Ambulatory Visit: Payer: Self-pay | Admitting: Family Medicine

## 2016-11-17 DIAGNOSIS — Z1231 Encounter for screening mammogram for malignant neoplasm of breast: Secondary | ICD-10-CM

## 2016-12-07 ENCOUNTER — Encounter: Payer: Medicaid Other | Attending: Physical Medicine and Rehabilitation | Admitting: Registered Nurse

## 2016-12-07 ENCOUNTER — Encounter: Payer: Self-pay | Admitting: Registered Nurse

## 2016-12-07 ENCOUNTER — Ambulatory Visit
Admission: RE | Admit: 2016-12-07 | Discharge: 2016-12-07 | Disposition: A | Discharge status: Home / Self Care | Payer: Medicaid Other | Source: Ambulatory Visit | Attending: Family Medicine | Admitting: Family Medicine

## 2016-12-07 VITALS — BP 117/73 | HR 90

## 2016-12-07 DIAGNOSIS — M5416 Radiculopathy, lumbar region: Secondary | ICD-10-CM | POA: Diagnosis not present

## 2016-12-07 DIAGNOSIS — M961 Postlaminectomy syndrome, not elsewhere classified: Secondary | ICD-10-CM | POA: Diagnosis present

## 2016-12-07 DIAGNOSIS — M545 Low back pain, unspecified: Secondary | ICD-10-CM

## 2016-12-07 DIAGNOSIS — Z79899 Other long term (current) drug therapy: Secondary | ICD-10-CM | POA: Diagnosis not present

## 2016-12-07 DIAGNOSIS — M791 Myalgia: Secondary | ICD-10-CM | POA: Insufficient documentation

## 2016-12-07 DIAGNOSIS — G894 Chronic pain syndrome: Secondary | ICD-10-CM | POA: Diagnosis not present

## 2016-12-07 DIAGNOSIS — Z1231 Encounter for screening mammogram for malignant neoplasm of breast: Secondary | ICD-10-CM

## 2016-12-07 DIAGNOSIS — M609 Myositis, unspecified: Secondary | ICD-10-CM | POA: Diagnosis present

## 2016-12-07 DIAGNOSIS — Z5181 Encounter for therapeutic drug level monitoring: Secondary | ICD-10-CM | POA: Diagnosis not present

## 2016-12-07 DIAGNOSIS — M25531 Pain in right wrist: Secondary | ICD-10-CM

## 2016-12-07 DIAGNOSIS — F3289 Other specified depressive episodes: Secondary | ICD-10-CM

## 2016-12-07 DIAGNOSIS — M797 Fibromyalgia: Secondary | ICD-10-CM | POA: Diagnosis not present

## 2016-12-07 DIAGNOSIS — M7918 Myalgia, other site: Secondary | ICD-10-CM

## 2016-12-07 MED ORDER — HYDROCODONE-ACETAMINOPHEN 5-325 MG PO TABS: 1.0000 | ORAL_TABLET | Freq: Four times a day (QID) | ORAL | 0 refills | Status: DC | PRN

## 2016-12-07 NOTE — Progress Notes (Signed)
Subjective:    Patient ID: Robin Morgan, female    DOB: [birth year], 61 y.o.   MRN: [id number]  HPI: Robin Morgan is a 61year old female who returns for follow up appointmentfor chronic pain and medication refill. She statesher pain is located in her right wrist she's wearing wist splint, lower back radiating into her right hip, right buttock and right lower extremity. She rates her pain 8. Her current exercise regime is walking and performing stretching exercises.  Last UDS was on 08/18/2016, it was consistent.   Pain Inventory Average Pain 9 Pain Right Now 8 My pain is aching  In the last 24 hours, has pain interfered with the following? General activity 4 Relation with others 4 Enjoyment of life 5 What TIME of day is your pain at its worst? evening Sleep (in general) Fair  Pain is worse with: . Pain improves with: . Relief from Meds: 5  Mobility ability to climb steps?  yes do you drive?  yes  Function disabled: date disabled .  Neuro/Psych trouble walking spasms  Prior Studies Any changes since last visit?  no  Physicians involved in your care Any changes since last visit?  no   Family History  Problem Relation Age of Onset  . Alzheimer's disease Mother   . Diabetes Father   . Heart disease Maternal Grandmother    Social History   Social History  . Marital status: Divorced    Spouse name: [name]  . Number of children: 1  . Years of education: [name]   Social History Main Topics  . Smoking status: Never Smoker  . Smokeless tobacco: Never Used  . Alcohol use No  . Drug use: No  . Sexual activity: Not on file   Other Topics Concern  . Not on file   Social History Narrative  . No narrative on file   Past Surgical History:  Procedure Laterality Date  . ABDOMINAL HYSTERECTOMY    . APPENDECTOMY    . BREAST SURGERY     rt breast cyst  . CHOLECYSTECTOMY    . ROTATOR CUFF REPAIR     right  . SPINE SURGERY    . TONSILLECTOMY      Past Medical History:  Diagnosis Date  . Calcific tendonitis   . Cervical spondylosis without myelopathy   . Depression   . Diabetes mellitus   . Esophageal stricture   . Fibromyalgia   . GERD (gastroesophageal reflux disease)   . Hiatal hernia   . Hyperlipidemia   . Hypertension   . Myalgia   . Pain in joint, upper arm    There were no vitals taken for this visit.  Opioid Risk Score:   Fall Risk Score:  `1  Depression screen PHQ 2/9  Depression screen [facility] 2/9 04/08/2016 10/08/2015 11/21/2014  Decreased Interest 2 2 2   Down, Depressed, Hopeless 0 0 0  PHQ - 2 Score 2 2 2   Altered sleeping - - 2  Tired, decreased energy - - 2  Change in appetite - - 1  Feeling bad or failure about yourself  - - 0  Trouble concentrating - - 1  Moving slowly or fidgety/restless - - 0  Suicidal thoughts - - 0  PHQ-9 Score - - 8    Review of Systems  Constitutional: Positive for unexpected weight change.  HENT: Negative.   Eyes: Negative.   Respiratory: Negative.   Cardiovascular: Negative.   Gastrointestinal: Negative.   Endocrine: Negative.  Genitourinary: Negative.   Musculoskeletal: Negative.   Skin: Negative.   Allergic/Immunologic: Negative.   Neurological: Negative.   Hematological: Negative.   Psychiatric/Behavioral: Negative.   All other systems reviewed and are negative.      Objective:   Physical Exam  Constitutional: She is oriented to person, place, and time. She appears well-developed and well-nourished.  HENT:  Head: Normocephalic and atraumatic.  Neck: Normal range of motion. Neck supple.  Cardiovascular: Normal rate and regular rhythm.   Pulmonary/Chest: Effort normal and breath sounds normal.  Musculoskeletal:  Normal Muscle Bulk and Muscle Testing Reveals: Upper Extremities: Right: Full ROM and Muscle Strength 3/5 Left: Full ROM and Muscle Strength 5/5 Thoracic Paraspinal Tenderness: T-7-T-9 Mainly Right Side Sacral Tenderness Noted Right Greater  Trochanter Tenderness Lower Extremities: Full ROM and Muscle Strength 5/5 Right Lower Extremity Flexion Produces Pain into Patella Arises from Table with ease Narrow Based Gait  Neurological: She is alert and oriented to person, place, and time.  Skin: Skin is warm and dry.  Psychiatric: She has a normal mood and affect.  Nursing note and vitals reviewed.         Assessment & Plan:  1. Fibromyalgia with myofascial pain: Continue exercise and heat therapy. 12/07/2016 2. Rotator cuff syndrome on the right: Continue with stretching exercises and heat therapy. 12/07/2016 3. Cervicalgia. Post-laminectomy syndrome, facet arthropathy: Continue Voltaren gel. 12/07/2016 4. Depression: Continue Cymbalta. PCP increase dose to 90 mg daily. 12/07/2016 5. Mid/low back pain: No complaints today. Continue Current Medication and stretching and heat therapy. 12/07/2016 6. Lumbar Radiculopathy: Continue Gabapentin 7 OA of right hand right wrist pain: No complaints voiced. Continue using Voltaren gel and heat therapy. 12/07/2016 8. Migraines: Continue Hydrocodone 5/325 mg one tablet every 8hours as needed. #80. 12/07/2016  F/U in 7month  20 minutes of face to face patient care time was spent during this visit. All questions were encouraged and answered.

## 2016-12-12 LAB — TOXASSURE SELECT,+ANTIDEPR,UR

## 2016-12-14 ENCOUNTER — Other Ambulatory Visit: Payer: Self-pay | Admitting: Physical Medicine & Rehabilitation

## 2016-12-15 ENCOUNTER — Telehealth: Payer: Self-pay | Admitting: *Deleted

## 2016-12-15 NOTE — Telephone Encounter (Signed)
U[place] drug screen for this encounter is consistent for prescribed medication.  Amitriptyline has not bee prescribed since 01/2016.

## 2017-01-05 ENCOUNTER — Telehealth: Payer: Self-pay | Admitting: Registered Nurse

## 2017-01-05 NOTE — Telephone Encounter (Signed)
On 01/05/2017 the  Robin Morgan was reviewed no conflict was seen on the [place] with multiple prescribers. Robin Morgan has a signed narcotic contract with our office. If there were any discrepancies this would have been reported to her physician.

## 2017-01-06 ENCOUNTER — Encounter: Payer: Medicaid Other | Attending: Physical Medicine and Rehabilitation | Admitting: Registered Nurse

## 2017-01-06 ENCOUNTER — Encounter: Payer: Self-pay | Admitting: Registered Nurse

## 2017-01-06 VITALS — BP 111/53 | HR 92 | Resp 14

## 2017-01-06 DIAGNOSIS — M797 Fibromyalgia: Secondary | ICD-10-CM | POA: Diagnosis not present

## 2017-01-06 DIAGNOSIS — G894 Chronic pain syndrome: Secondary | ICD-10-CM

## 2017-01-06 DIAGNOSIS — M961 Postlaminectomy syndrome, not elsewhere classified: Secondary | ICD-10-CM

## 2017-01-06 DIAGNOSIS — E1142 Type 2 diabetes mellitus with diabetic polyneuropathy: Secondary | ICD-10-CM | POA: Insufficient documentation

## 2017-01-06 DIAGNOSIS — M609 Myositis, unspecified: Secondary | ICD-10-CM | POA: Diagnosis present

## 2017-01-06 DIAGNOSIS — M791 Myalgia: Secondary | ICD-10-CM | POA: Insufficient documentation

## 2017-01-06 DIAGNOSIS — M4716 Other spondylosis with myelopathy, lumbar region: Secondary | ICD-10-CM | POA: Diagnosis not present

## 2017-01-06 DIAGNOSIS — M25551 Pain in right hip: Secondary | ICD-10-CM | POA: Diagnosis not present

## 2017-01-06 DIAGNOSIS — F3289 Other specified depressive episodes: Secondary | ICD-10-CM | POA: Diagnosis not present

## 2017-01-06 DIAGNOSIS — M25531 Pain in right wrist: Secondary | ICD-10-CM

## 2017-01-06 DIAGNOSIS — M7918 Myalgia, other site: Secondary | ICD-10-CM

## 2017-01-06 MED ORDER — HYDROCODONE-ACETAMINOPHEN 5-325 MG PO TABS: 1.0000 | ORAL_TABLET | Freq: Four times a day (QID) | ORAL | 0 refills | Status: DC | PRN

## 2017-01-06 NOTE — Progress Notes (Signed)
Subjective:    Patient ID: Robin Morgan, female    DOB: [birth year], 61 y.o.   MRN: [id number]  HPI: Robin Morgan is a 61year old female who returns for follow up appointmentfor chronic pain and medication refill. She statesher pain is located in her right wrist radiating into her right arm, she received a steroid injection today, by her PCP today, she's wearing spica splint, also has  right hip pain radiating into her right lower extremity. She rates her pain 9. Her current exercise regime is walking and performing stretching exercises.  Ms. Howeth may have cervical distribution from C-7 into her left hand  ( Middle and index finger), will speak with Dr. Naaman Plummer, she verbalizes understanding.   Last UDS was on 12/07/2016, it was consistent.  Pain Inventory Average Pain 8 Pain Right Now 9 My pain is stabbing and aching  In the last 24 hours, has pain interfered with the following? General activity 8 Relation with others 7 Enjoyment of life 8 What TIME of day is your pain at its worst? morning, evening, night Sleep (in general) Good  Pain is worse with: walking and standing Pain improves with: medication Relief from Meds: 8  Mobility ability to climb steps?  yes do you drive?  yes  Function disabled: date disabled .  Neuro/Psych numbness spasms depression  Prior Studies Any changes since last visit?  no  Physicians involved in your care Any changes since last visit?  no   Family History  Problem Relation Age of Onset  . Alzheimer's disease Mother   . Diabetes Father   . Heart disease Maternal Grandmother   . Breast cancer Paternal Grandmother 5   Social History   Social History  . Marital status: Divorced    Spouse name: [name]  . Number of children: 1  . Years of education: [name]   Social History Main Topics  . Smoking status: Never Smoker  . Smokeless tobacco: Never Used  . Alcohol use No  . Drug use: No  . Sexual activity: Not Asked    Other Topics Concern  . None   Social History Narrative  . None   Past Surgical History:  Procedure Laterality Date  . ABDOMINAL HYSTERECTOMY    . APPENDECTOMY    . BREAST EXCISIONAL BIOPSY Right 1990's  . BREAST SURGERY     rt breast cyst  . CHOLECYSTECTOMY    . ROTATOR CUFF REPAIR     right  . SPINE SURGERY    . TONSILLECTOMY     Past Medical History:  Diagnosis Date  . Calcific tendonitis   . Cervical spondylosis without myelopathy   . Depression   . Diabetes mellitus   . Esophageal stricture   . Fibromyalgia   . GERD (gastroesophageal reflux disease)   . Hiatal hernia   . Hyperlipidemia   . Hypertension   . Myalgia   . Pain in joint, upper arm    BP (!) 111/53 (BP Location: Left Arm, Patient Position: Sitting, Cuff Size: Normal)   Pulse 92   Resp 14   SpO2 91%   Opioid Risk Score:   Fall Risk Score:  `1  Depression screen PHQ 2/9  Depression screen [facility] 2/9 04/08/2016 10/08/2015 11/21/2014  Decreased Interest 2 2 2   Down, Depressed, Hopeless 0 0 0  PHQ - 2 Score 2 2 2   Altered sleeping - - 2  Tired, decreased energy - - 2  Change in appetite - - 1  Feeling bad or failure about yourself  - - 0  Trouble concentrating - - 1  Moving slowly or fidgety/restless - - 0  Suicidal thoughts - - 0  PHQ-9 Score - - 8    Review of Systems  Constitutional: Negative.   HENT: Negative.   Eyes: Negative.   Respiratory: Positive for apnea and shortness of breath.   Cardiovascular: Negative.   Gastrointestinal: Negative.   Endocrine:       High and low blood sugar  Genitourinary: Negative.   Musculoskeletal: Positive for arthralgias, back pain and myalgias.       Spasms  Skin: Negative.   Allergic/Immunologic: Negative.   Neurological: Positive for numbness.  Hematological: Negative.   Psychiatric/Behavioral: Positive for dysphoric mood.       Objective:   Physical Exam  Constitutional: She is oriented to person, place, and time. She appears  well-developed and well-nourished.  HENT:  Head: Normocephalic and atraumatic.  Neck: Normal range of motion. Neck supple.  Cardiovascular: Normal rate and regular rhythm.   Pulmonary/Chest: Effort normal and breath sounds normal.  Musculoskeletal:  Normal Muscle Bulk and Muscle Testing Reveals: Upper Extremities: Full ROM and Muscle Strength on Right 4/5 and Left 5/5 Thoracic Paraspinal Tenderness: T-7-T-9 Lower Extremities: Full ROM and Muscle Strength 5/5 Arises from Table with ease Narrow Based Gait   Neurological: She is alert and oriented to person, place, and time.  Skin: Skin is warm and dry.  Psychiatric: She has a normal mood and affect.  Nursing note and vitals reviewed.         Assessment & Plan:  1. Fibromyalgia with myofascial pain: Continue exercise and heat therapy. 01/06/2017 2. Rotator cuff syndrome on the right: Continue with stretching exercises and heat therapy. 01/06/2017 3. Cervicalgia. Post-laminectomy syndrome, facet arthropathy: Continue Voltaren gel. 01/06/2017 4. Depression: Continue Cymbalta. PCP increase dose to 90 mg daily. 01/06/2017 5. Mid/low back pain: No complaints today. Continue Current Medication and stretching and heat therapy. 12/07/2016 6. Lumbar Radiculopathy: Continue Gabapentin 7 OA of right hand right wrist pain: No complaints voiced. Continue using Voltaren gel and heat therapy. 01/06/2017 8. Migraines: Continue Hydrocodone 5/325 mg one tablet every 8hours as needed. #80. 01/06/2017  20 minutes of face to face patient care time was spent during this visit. All questions were encouraged and answered.   F/U in 56month

## 2017-01-20 ENCOUNTER — Telehealth: Payer: Self-pay | Admitting: Physical Medicine & Rehabilitation

## 2017-01-20 NOTE — Telephone Encounter (Signed)
Patient called and states medicaid needs to be auth for EMG?  Left message we are not aware

## 2017-02-02 ENCOUNTER — Encounter: Payer: Self-pay | Admitting: Registered Nurse

## 2017-02-02 ENCOUNTER — Encounter: Payer: Medicaid Other | Attending: Physical Medicine and Rehabilitation | Admitting: Registered Nurse

## 2017-02-02 VITALS — BP 105/48 | HR 94

## 2017-02-02 DIAGNOSIS — M4716 Other spondylosis with myelopathy, lumbar region: Secondary | ICD-10-CM

## 2017-02-02 DIAGNOSIS — M961 Postlaminectomy syndrome, not elsewhere classified: Secondary | ICD-10-CM | POA: Diagnosis present

## 2017-02-02 DIAGNOSIS — M7918 Myalgia, other site: Secondary | ICD-10-CM

## 2017-02-02 DIAGNOSIS — M791 Myalgia: Secondary | ICD-10-CM | POA: Diagnosis present

## 2017-02-02 DIAGNOSIS — F3289 Other specified depressive episodes: Secondary | ICD-10-CM | POA: Diagnosis not present

## 2017-02-02 DIAGNOSIS — M797 Fibromyalgia: Secondary | ICD-10-CM | POA: Diagnosis not present

## 2017-02-02 DIAGNOSIS — M609 Myositis, unspecified: Secondary | ICD-10-CM | POA: Diagnosis present

## 2017-02-02 DIAGNOSIS — G894 Chronic pain syndrome: Secondary | ICD-10-CM

## 2017-02-02 DIAGNOSIS — M5010 Cervical disc disorder with radiculopathy, unspecified cervical region: Secondary | ICD-10-CM

## 2017-02-02 DIAGNOSIS — Z79899 Other long term (current) drug therapy: Secondary | ICD-10-CM

## 2017-02-02 DIAGNOSIS — Z5181 Encounter for therapeutic drug level monitoring: Secondary | ICD-10-CM

## 2017-02-02 MED ORDER — HYDROCODONE-ACETAMINOPHEN 7.5-325 MG PO TABS: 1.0000 | ORAL_TABLET | Freq: Three times a day (TID) | ORAL | 0 refills | Status: DC | PRN

## 2017-02-02 NOTE — Progress Notes (Signed)
Subjective:    Patient ID: Robin Morgan, female    DOB: 1956/02/02, 61 y.o.   MRN: [id number]  HPI: Robin Morgan is a 61year old female who returns for follow up appointmentfor chronic pain and medication refill. She statesher pain is located in her neck radiating into her right shoulder and mid-lower back pain. Robin Morgan states her pain has intensifies in her neck and mid-lower back over the last few months, she denies falling. She rates her pain 7. Her current exercise regime is walking.  Last UDS was performed on 12/07/2016, it was consistent.  Pain Inventory Average Pain 8 Pain Right Now 7 My pain is aching  In the last 24 hours, has pain interfered with the following? General activity 7 Relation with others 5 Enjoyment of life 5 What TIME of day is your pain at its worst? night Sleep (in general) Fair  Pain is worse with: sitting Pain improves with: medication Relief from Meds: 8  Mobility ability to climb steps?  yes do you drive?  yes  Function disabled: date disabled .  Neuro/Psych numbness spasms  Prior Studies Any changes since last visit?  no  Physicians involved in your care Any changes since last visit?  no   Family History  Problem Relation Age of Onset  . Alzheimer's disease Mother   . Diabetes Father   . Heart disease Maternal Grandmother   . Breast cancer Paternal Grandmother 24   Social History   Social History  . Marital status: Divorced    Spouse name: [name]  . Number of children: 1  . Years of education: [name]   Social History Main Topics  . Smoking status: Never Smoker  . Smokeless tobacco: Never Used  . Alcohol use No  . Drug use: No  . Sexual activity: Not on file   Other Topics Concern  . Not on file   Social History Narrative  . No narrative on file   Past Surgical History:  Procedure Laterality Date  . ABDOMINAL HYSTERECTOMY    . APPENDECTOMY    . BREAST EXCISIONAL BIOPSY Right 1990's  . BREAST SURGERY      rt breast cyst  . CHOLECYSTECTOMY    . ROTATOR CUFF REPAIR     right  . SPINE SURGERY    . TONSILLECTOMY     Past Medical History:  Diagnosis Date  . Calcific tendonitis   . Cervical spondylosis without myelopathy   . Depression   . Diabetes mellitus   . Esophageal stricture   . Fibromyalgia   . GERD (gastroesophageal reflux disease)   . Hiatal hernia   . Hyperlipidemia   . Hypertension   . Myalgia   . Pain in joint, upper arm    There were no vitals taken for this visit.  Opioid Risk Score:   Fall Risk Score:  `1  Depression screen PHQ 2/9  Depression screen [facility] 2/9 04/08/2016 10/08/2015 11/21/2014  Decreased Interest 2 2 2   Down, Depressed, Hopeless 0 0 0  PHQ - 2 Score 2 2 2   Altered sleeping - - 2  Tired, decreased energy - - 2  Change in appetite - - 1  Feeling bad or failure about yourself  - - 0  Trouble concentrating - - 1  Moving slowly or fidgety/restless - - 0  Suicidal thoughts - - 0  PHQ-9 Score - - 8     Review of Systems  Constitutional: Positive for unexpected weight change.  HENT:  Negative.   Eyes: Negative.   Respiratory: Positive for apnea and shortness of breath.   Cardiovascular: Negative.   Gastrointestinal: Negative.   Endocrine: Negative.   Genitourinary: Negative.   Musculoskeletal: Negative.   Skin: Negative.   Allergic/Immunologic: Negative.   Neurological: Negative.   Hematological: Negative.   Psychiatric/Behavioral: Negative.   All other systems reviewed and are negative.      Objective:   Physical Exam  Constitutional: She is oriented to person, place, and time. She appears well-developed and well-nourished.  HENT:  Head: Normocephalic and atraumatic.  Neck: Normal range of motion. Neck supple.  Cervical Paraspinal Tenderness: C-5-C-6  Cardiovascular: Normal rate and regular rhythm.   Pulmonary/Chest: Effort normal and breath sounds normal.  Musculoskeletal:  Normal Muscle Bulk and Muscle Testing Reveals: Upper  Extremities: Full ROM and Muscle Strength 4/5 Right AC Joint Tenderness Lumbar Paraspinal Tenderness: L-3-L-5 Lower Extremities: Full ROM and Muscle Strength 5/5 Arises from Table with ease Narrow Based Gait  Neurological: She is alert and oriented to person, place, and time.  Skin: Skin is warm and dry.  Psychiatric: She has a normal mood and affect.  Nursing note and vitals reviewed.         Assessment & Plan:  1. Fibromyalgia with myofascial pain: Continue exercise and heat therapy. 02/02/2017 2. Rotator cuff syndrome on the right: Continue with stretching exercises and heat therapy. 02/02/2017 3. Cervicalgia. Post-laminectomy syndrome, facet arthropathy: Cervical Radiculopathy: Schedule EMG with Dr. Naaman Plummer on 03/08/2017. Continue Voltaren gel. 02/02/2017 4. Depression: Continue Cymbalta. PCP increase dose to 90 mg daily. 02/02/2017 5. Mid/low back pain: Continue Current Medication and stretching and heat therapy. Increased Hydrocodone to 7.5 mg this month, instructed to call office on Monday to evaluate medication regimen she verbalizes understanding. We will re-evaluate next visit. 02/02/2017 6. Lumbar Radiculopathy: Continue Gabapentin. 02/02/2017 7OA of right hand right wrist pain: No complaints voiced. Continue using Voltaren gel and heat therapy. 02/02/2017 8. Migraines: Continue Hydrocodone 5/325.  02/02/2017  20 minutes of face to face patient care time was spent during this visit. All questions were encouraged and answered.   F/U in 65month

## 2017-02-04 ENCOUNTER — Other Ambulatory Visit: Payer: Self-pay | Admitting: Physical Medicine & Rehabilitation

## 2017-02-11 ENCOUNTER — Other Ambulatory Visit: Payer: Self-pay | Admitting: *Deleted

## 2017-02-11 MED ORDER — MELOXICAM 7.5 MG PO TABS: ORAL_TABLET | ORAL | 4 refills | Status: DC

## 2017-02-12 ENCOUNTER — Other Ambulatory Visit: Payer: Self-pay | Admitting: *Deleted

## 2017-03-08 ENCOUNTER — Encounter: Payer: Self-pay | Admitting: Physical Medicine & Rehabilitation

## 2017-03-08 ENCOUNTER — Encounter: Payer: Medicaid Other | Attending: Physical Medicine and Rehabilitation | Admitting: Physical Medicine & Rehabilitation

## 2017-03-08 DIAGNOSIS — M961 Postlaminectomy syndrome, not elsewhere classified: Secondary | ICD-10-CM | POA: Insufficient documentation

## 2017-03-08 DIAGNOSIS — G5602 Carpal tunnel syndrome, left upper limb: Secondary | ICD-10-CM

## 2017-03-08 DIAGNOSIS — M609 Myositis, unspecified: Secondary | ICD-10-CM | POA: Insufficient documentation

## 2017-03-08 DIAGNOSIS — M791 Myalgia: Secondary | ICD-10-CM | POA: Insufficient documentation

## 2017-03-08 NOTE — Progress Notes (Signed)
Subjective:    Patient ID: Robin Morgan, female    DOB: 1956/02/09, 61 y.o.   MRN: [id number]  HPI   Here for NCS/EMG LUE---symptoms are most consistent with CTS  Pain Inventory Average Pain 8 Pain Right Now 7 My pain is constant, tingling and aching  In the last 24 hours, has pain interfered with the following? General activity 7 Relation with others 8 Enjoyment of life 7 What TIME of day is your pain at its worst? evening Sleep (in general) Fair  Pain is worse with: standing Pain improves with: heat/ice and medication Relief from Meds: 8  Mobility ability to climb steps?  yes do you drive?  yes  Function disabled: date disabled .  Neuro/Psych bladder control problems bowel control problems numbness spasms  Prior Studies Any changes since last visit?  no  Physicians involved in your care Any changes since last visit?  no   Family History  Problem Relation Age of Onset  . Alzheimer's disease Mother   . Diabetes Father   . Heart disease Maternal Grandmother   . Breast cancer Paternal Grandmother 31   Social History   Social History  . Marital status: Divorced    Spouse name: [name]  . Number of children: 1  . Years of education: [name]   Social History Main Topics  . Smoking status: Never Smoker  . Smokeless tobacco: Never Used  . Alcohol use No  . Drug use: No  . Sexual activity: Not Asked   Other Topics Concern  . None   Social History Narrative  . None   Past Surgical History:  Procedure Laterality Date  . ABDOMINAL HYSTERECTOMY    . APPENDECTOMY    . BREAST EXCISIONAL BIOPSY Right 1990's  . BREAST SURGERY     rt breast cyst  . CHOLECYSTECTOMY    . ROTATOR CUFF REPAIR     right  . SPINE SURGERY    . TONSILLECTOMY     Past Medical History:  Diagnosis Date  . Calcific tendonitis   . Cervical spondylosis without myelopathy   . Depression   . Diabetes mellitus   . Esophageal stricture   . Fibromyalgia   . GERD  (gastroesophageal reflux disease)   . Hiatal hernia   . Hyperlipidemia   . Hypertension   . Myalgia   . Pain in joint, upper arm    BP 126/76   Pulse 91   SpO2 96%   Opioid Risk Score:   Fall Risk Score:  `1  Depression screen PHQ 2/9  Depression screen [facility] 2/9 04/08/2016 10/08/2015 11/21/2014  Decreased Interest 2 2 2   Down, Depressed, Hopeless 0 0 0  PHQ - 2 Score 2 2 2   Altered sleeping - - 2  Tired, decreased energy - - 2  Change in appetite - - 1  Feeling bad or failure about yourself  - - 0  Trouble concentrating - - 1  Moving slowly or fidgety/restless - - 0  Suicidal thoughts - - 0  PHQ-9 Score - - 8     Review of Systems  Constitutional: Positive for diaphoresis and unexpected weight change.  HENT: Negative.   Eyes: Negative.   Respiratory: Positive for cough.   Cardiovascular: Negative.   Gastrointestinal: Positive for abdominal pain, constipation and nausea.  Endocrine: Negative.   Genitourinary: Negative.   Musculoskeletal: Negative.   Skin: Negative.   Allergic/Immunologic: Negative.   Neurological: Negative.   Hematological: Negative.   Psychiatric/Behavioral: Negative.  All other systems reviewed and are negative.      Objective:   Physical Exam        Assessment & Plan:  PROCEDURE REPORT UPPER EXTREMITY EMG/NCS  (LEFT) COMPLAINT: Left hand tingling/numbness   Nerve Conduction Study: A  left upper extremity nerve conduction screen was performed. All skin was prepped with isopropyl alcohol. The median, ulnar, and radial sensory nerves were stimulated antidromically using the middle finger, 5th finger, and the second metacarpal respectively as sites for the active and reference electrodes. Testing revealed slowed sensory distal peak latency at palm (3.62ms) and absent SNAP at the wrist. Other tested nerves were normal. .   Median and ulnar motor responses were measured orthodromically using the APB and ADM as the site for placement of the  active and reference electrodes. The nerves were stimulated both at the wrist.  The median nerve was also stimulated in the antecubital fossa. The ulnar nerve was stimulated above and below the ulnar groove. Testing revealed delayed CMAP (6.64ms)  at left wrist with slowed conduction velocity of 18m/s in the left median nerve across the arm to the wrist. Ulnar potentials were all normal.                     .  Conclusion:  1. Moderate to severe CTS Left Wrist 2. Surgical referral was made as pt has failed conservative care 3. No prescriptions otherwise needed today 4. Follow up in one month    Meredith Staggers, MD, [place]

## 2017-03-08 NOTE — Patient Instructions (Signed)
PLEASE FEEL FREE TO CALL OUR OFFICE WITH ANY PROBLEMS OR QUESTIONS ([phone])      

## 2017-03-09 ENCOUNTER — Telehealth: Payer: Self-pay | Admitting: Physical Medicine & Rehabilitation

## 2017-03-09 DIAGNOSIS — G5602 Carpal tunnel syndrome, left upper limb: Secondary | ICD-10-CM

## 2017-03-09 NOTE — Telephone Encounter (Signed)
Ok, no problem!

## 2017-03-09 NOTE — Telephone Encounter (Signed)
Ptn Daughter t/o states Mom has medicaid and need referral changed to [facility] or [facility] - instead of [facility] ctr please

## 2017-04-08 ENCOUNTER — Ambulatory Visit
Admission: RE | Admit: 2017-04-08 | Discharge: 2017-04-08 | Disposition: A | Discharge status: Home / Self Care | Payer: Medicaid Other | Source: Ambulatory Visit | Attending: Registered Nurse | Admitting: Registered Nurse

## 2017-04-08 ENCOUNTER — Encounter: Payer: Medicaid Other | Attending: Physical Medicine and Rehabilitation | Admitting: Registered Nurse

## 2017-04-08 ENCOUNTER — Encounter: Payer: Self-pay | Admitting: Registered Nurse

## 2017-04-08 ENCOUNTER — Telehealth: Payer: Self-pay | Admitting: Registered Nurse

## 2017-04-08 VITALS — BP 107/57 | HR 91 | Resp 14

## 2017-04-08 DIAGNOSIS — M961 Postlaminectomy syndrome, not elsewhere classified: Secondary | ICD-10-CM | POA: Diagnosis present

## 2017-04-08 DIAGNOSIS — M546 Pain in thoracic spine: Secondary | ICD-10-CM

## 2017-04-08 DIAGNOSIS — M4716 Other spondylosis with myelopathy, lumbar region: Secondary | ICD-10-CM

## 2017-04-08 DIAGNOSIS — M609 Myositis, unspecified: Secondary | ICD-10-CM | POA: Diagnosis present

## 2017-04-08 DIAGNOSIS — G894 Chronic pain syndrome: Secondary | ICD-10-CM | POA: Diagnosis not present

## 2017-04-08 DIAGNOSIS — M79641 Pain in right hand: Secondary | ICD-10-CM

## 2017-04-08 DIAGNOSIS — M7918 Myalgia, other site: Secondary | ICD-10-CM

## 2017-04-08 DIAGNOSIS — G5602 Carpal tunnel syndrome, left upper limb: Secondary | ICD-10-CM

## 2017-04-08 DIAGNOSIS — M791 Myalgia: Secondary | ICD-10-CM

## 2017-04-08 DIAGNOSIS — M25561 Pain in right knee: Secondary | ICD-10-CM

## 2017-04-08 DIAGNOSIS — Z5181 Encounter for therapeutic drug level monitoring: Secondary | ICD-10-CM

## 2017-04-08 DIAGNOSIS — Z79899 Other long term (current) drug therapy: Secondary | ICD-10-CM

## 2017-04-08 DIAGNOSIS — G8929 Other chronic pain: Secondary | ICD-10-CM

## 2017-04-08 MED ORDER — HYDROCODONE-ACETAMINOPHEN 7.5-325 MG PO TABS: 1.0000 | ORAL_TABLET | Freq: Three times a day (TID) | ORAL | 0 refills | Status: DC | PRN

## 2017-04-08 NOTE — Telephone Encounter (Signed)
On 04/08/2017 the  [place] was reviewed no conflict was seen on the [place] with multiple prescribers. Ms. Bradt has a signed narcotic contract with our office. If there were any discrepancies this would have been reported to her physician.

## 2017-04-08 NOTE — Progress Notes (Signed)
Subjective:    Patient ID: Robin Morgan, female    DOB: March 28, 1956, 61 y.o.   MRN: [id number]  HPI: [name] is a 61year old female who returns for follow up appointmentfor chronic pain and medication refill. She statesher pain is located in her neck radiating into her right shoulder and mid-lower back pain. Robin Morgan states her pain has intensifies in her neck and mid-lower back over the last few months, she denies falling. She rates her pain 7. Her current exercise regime is walking.  Last UDS was performed on 12/07/2016, it was consistent.    Pain Inventory Average Pain 8 Pain Right Now 7 My pain is stabbing and aching  In the last 24 hours, has pain interfered with the following? General activity 7 Relation with others 7 Enjoyment of life 0 What TIME of day is your pain at its worst? morning,night Sleep (in general) Fair  Pain is worse with: sitting and standing Pain improves with: medication Relief from Meds: 9  Mobility walk without assistance ability to climb steps?  yes do you drive?  yes  Function disabled: date disabled .  Neuro/Psych bowel control problems numbness spasms  Prior Studies Any changes since last visit?  no  Physicians involved in your care Any changes since last visit?  no   Family History  Problem Relation Age of Onset  . Alzheimer's disease Mother   . Diabetes Father   . Heart disease Maternal Grandmother   . Breast cancer Paternal Grandmother 21   Social History   Social History  . Marital status: Divorced    Spouse name: [name]  . Number of children: 1  . Years of education: [name]   Social History Main Topics  . Smoking status: Never Smoker  . Smokeless tobacco: Never Used  . Alcohol use No  . Drug use: No  . Sexual activity: Not Asked   Other Topics Concern  . None   Social History Narrative  . None   Past Surgical History:  Procedure Laterality Date  . ABDOMINAL HYSTERECTOMY    . APPENDECTOMY     . BREAST EXCISIONAL BIOPSY Right 1990's  . BREAST SURGERY     rt breast cyst  . CHOLECYSTECTOMY    . ROTATOR CUFF REPAIR     right  . SPINE SURGERY    . TONSILLECTOMY     Past Medical History:  Diagnosis Date  . Calcific tendonitis   . Cervical spondylosis without myelopathy   . Depression   . Diabetes mellitus   . Esophageal stricture   . Fibromyalgia   . GERD (gastroesophageal reflux disease)   . Hiatal hernia   . Hyperlipidemia   . Hypertension   . Myalgia   . Pain in joint, upper arm    BP (!) 107/57 (BP Location: Left Arm, Patient Position: Sitting, Cuff Size: Normal)   Pulse 91   Resp 14   SpO2 93%   Opioid Risk Score:   Fall Risk Score:  `1  Depression screen PHQ 2/9  Depression screen [facility] 2/9 04/08/2016 10/08/2015 11/21/2014  Decreased Interest 2 2 2   Down, Depressed, Hopeless 0 0 0  PHQ - 2 Score 2 2 2   Altered sleeping - - 2  Tired, decreased energy - - 2  Change in appetite - - 1  Feeling bad or failure about yourself  - - 0  Trouble concentrating - - 1  Moving slowly or fidgety/restless - - 0  Suicidal thoughts - -  0  PHQ-9 Score - - 8    Review of Systems  Constitutional: Positive for unexpected weight change.  HENT: Negative.   Eyes: Negative.   Respiratory: Negative.   Cardiovascular: Negative.   Gastrointestinal: Positive for abdominal pain, diarrhea and nausea.  Endocrine: Negative.        High blood sugar  Genitourinary: Negative.   Musculoskeletal: Positive for arthralgias and back pain.       Spasms   Allergic/Immunologic: Negative.   Neurological: Negative.   Hematological: Negative.   Psychiatric/Behavioral: Negative.        Objective:   Physical Exam        Assessment & Plan:  1. Fibromyalgia with myofascial pain: Continue exercise and heat therapy. 04/08/2017 2. Rotator cuff syndrome on the right: Continue with stretching exercises and heat therapy. 08//30/2018 3. Cervicalgia. Post-laminectomy syndrome, facet  arthropathy: Cervical Radiculopathy: Continue Gabapentin. 04/09/19  S/ P  EMG with Dr. Naaman Plummer on 03/08/2017: Diagnosed with Moderate to Severe CTS of Left wrist: 04/08/2017 4. Depression: Continue Cymbalta. PCP increase dose to 90 mg daily. 04/08/2017 5. Mid/low back pain/ Lumbar Spondylosis: Continue Current Medication and stretching and heat therapy. Refilled: Hydrocodone 7.5/325 mg one tablet every 8 hours as needed for moderate pain #80. 6. Lumbar Radiculopathy: Continue Gabapentin. 04/08/2017 7OA of right hand/ Increase Right hand Pain: RX: X-ray:  Continue using Voltaren gel and heat therapy. 04/08/2017 8. Migraines: Continue Hydrocodone 5/325.  04/08/2017 9. Left CTS:S/P EMG on 03/08/2017:  Has a scheduled appointment with Dr. Apolonio Schneiders on 04/19/2017 10. Right Knee Pain: RX: X-ray: 04/08/2017  20 minutes of face to face patient care time was spent during this visit. All questions were encouraged and answered.    F/U in 39month

## 2017-04-09 ENCOUNTER — Telehealth: Payer: Self-pay | Admitting: Registered Nurse

## 2017-04-09 ENCOUNTER — Telehealth: Payer: Self-pay

## 2017-04-09 DIAGNOSIS — F329 Major depressive disorder, single episode, unspecified: Secondary | ICD-10-CM

## 2017-04-09 DIAGNOSIS — M158 Other polyosteoarthritis: Secondary | ICD-10-CM

## 2017-04-09 DIAGNOSIS — F32A Depression, unspecified: Secondary | ICD-10-CM

## 2017-04-09 DIAGNOSIS — M961 Postlaminectomy syndrome, not elsewhere classified: Secondary | ICD-10-CM

## 2017-04-09 MED ORDER — DICLOFENAC SODIUM 1 % TD GEL: 2.0000 g | Freq: Four times a day (QID) | TRANSDERMAL | 3 refills | Status: DC

## 2017-04-09 NOTE — Telephone Encounter (Signed)
Medication sent to wrong location, reordered and sent to right one

## 2017-04-09 NOTE — Telephone Encounter (Signed)
Reviewed [name] X-ray's: She verbalizes understanding: [name] states she never had an allergic reaction to Voltaren Gel   Voltaren Gel refilled, she verbalizes understanding.

## 2017-04-29 ENCOUNTER — Encounter: Payer: Self-pay | Admitting: Registered Nurse

## 2017-04-29 ENCOUNTER — Encounter: Payer: Medicaid Other | Attending: Physical Medicine and Rehabilitation | Admitting: Registered Nurse

## 2017-04-29 VITALS — BP 110/54 | HR 85 | Resp 14

## 2017-04-29 DIAGNOSIS — M791 Myalgia: Secondary | ICD-10-CM

## 2017-04-29 DIAGNOSIS — M7918 Myalgia, other site: Secondary | ICD-10-CM

## 2017-04-29 DIAGNOSIS — M158 Other polyosteoarthritis: Secondary | ICD-10-CM | POA: Diagnosis not present

## 2017-04-29 DIAGNOSIS — G8929 Other chronic pain: Secondary | ICD-10-CM

## 2017-04-29 DIAGNOSIS — G5602 Carpal tunnel syndrome, left upper limb: Secondary | ICD-10-CM | POA: Diagnosis not present

## 2017-04-29 DIAGNOSIS — Z79899 Other long term (current) drug therapy: Secondary | ICD-10-CM

## 2017-04-29 DIAGNOSIS — M546 Pain in thoracic spine: Secondary | ICD-10-CM | POA: Diagnosis not present

## 2017-04-29 DIAGNOSIS — M4716 Other spondylosis with myelopathy, lumbar region: Secondary | ICD-10-CM

## 2017-04-29 DIAGNOSIS — M961 Postlaminectomy syndrome, not elsewhere classified: Secondary | ICD-10-CM | POA: Insufficient documentation

## 2017-04-29 DIAGNOSIS — G894 Chronic pain syndrome: Secondary | ICD-10-CM

## 2017-04-29 DIAGNOSIS — M5010 Cervical disc disorder with radiculopathy, unspecified cervical region: Secondary | ICD-10-CM | POA: Diagnosis not present

## 2017-04-29 DIAGNOSIS — M609 Myositis, unspecified: Secondary | ICD-10-CM | POA: Insufficient documentation

## 2017-04-29 DIAGNOSIS — M797 Fibromyalgia: Secondary | ICD-10-CM | POA: Diagnosis not present

## 2017-04-29 DIAGNOSIS — Z5181 Encounter for therapeutic drug level monitoring: Secondary | ICD-10-CM

## 2017-04-29 MED ORDER — HYDROCODONE-ACETAMINOPHEN 7.5-325 MG PO TABS: 1.0000 | ORAL_TABLET | Freq: Three times a day (TID) | ORAL | 0 refills | Status: DC | PRN

## 2017-04-29 NOTE — Progress Notes (Signed)
Subjective:    Patient ID: Robin Morgan, female    DOB: [birth year], 61 y.o.   MRN: [id number]  HPI: [name] is a 61year old female who returns for follow up appointmentfor chronic pain and medication refill. She statesher pain is located in her left hand with numbness and tingling, also neck pain  radiating into her right shoulder and mid-lower back pain. She rates her pain 8. Her current exercise regime is walking and performing stretching exercises. Ms. Karis states she has increased her activity.   Last UDS was performed on 12/07/2016, it was consistent.  Pain Inventory Average Pain 8 Pain Right Now 8 My pain is constant and aching  In the last 24 hours, has pain interfered with the following? General activity 7 Relation with others 6 Enjoyment of life 7 What TIME of day is your pain at its worst? evening,night Sleep (in general) Fair  Pain is worse with: bending and standing Pain improves with: heat/ice and medication Relief from Meds: 8  Mobility walk without assistance ability to climb steps?  yes do you drive?  yes  Function disabled: date disabled .  Neuro/Psych numbness spasms  Prior Studies Any changes since last visit?  no  Physicians involved in your care Any changes since last visit?  no   Family History  Problem Relation Age of Onset  . Alzheimer's disease Mother   . Diabetes Father   . Heart disease Maternal Grandmother   . Breast cancer Paternal Grandmother 65   Social History   Social History  . Marital status: Divorced    Spouse name: [name]  . Number of children: 1  . Years of education: [name]   Social History Main Topics  . Smoking status: Never Smoker  . Smokeless tobacco: Never Used  . Alcohol use No  . Drug use: No  . Sexual activity: Not Asked   Other Topics Concern  . None   Social History Narrative  . None   Past Surgical History:  Procedure Laterality Date  . ABDOMINAL HYSTERECTOMY    .  APPENDECTOMY    . BREAST EXCISIONAL BIOPSY Right 1990's  . BREAST SURGERY     rt breast cyst  . CHOLECYSTECTOMY    . ROTATOR CUFF REPAIR     right  . SPINE SURGERY    . TONSILLECTOMY     Past Medical History:  Diagnosis Date  . Calcific tendonitis   . Cervical spondylosis without myelopathy   . Depression   . Diabetes mellitus   . Esophageal stricture   . Fibromyalgia   . GERD (gastroesophageal reflux disease)   . Hiatal hernia   . Hyperlipidemia   . Hypertension   . Myalgia   . Pain in joint, upper arm    BP (!) 110/54 (BP Location: Right Arm, Patient Position: Sitting, Cuff Size: Normal)   Pulse 85   Resp 14   SpO2 93%   Opioid Risk Score:   Fall Risk Score:  `1  Depression screen PHQ 2/9  Depression screen [facility] 2/9 04/08/2016 10/08/2015 11/21/2014  Decreased Interest 2 2 2   Down, Depressed, Hopeless 0 0 0  PHQ - 2 Score 2 2 2   Altered sleeping - - 2  Tired, decreased energy - - 2  Change in appetite - - 1  Feeling bad or failure about yourself  - - 0  Trouble concentrating - - 1  Moving slowly or fidgety/restless - - 0  Suicidal thoughts - - 0  PHQ-9 Score - - 8    Review of Systems  Constitutional: Positive for unexpected weight change.  HENT: Negative.   Eyes: Negative.   Respiratory: Negative.   Cardiovascular: Negative.   Gastrointestinal: Negative.   Endocrine: Negative.        High blood sugar  Genitourinary: Negative.   Musculoskeletal: Positive for arthralgias and back pain.       Spasms   Skin: Negative.   Allergic/Immunologic: Negative.   Neurological: Positive for numbness.  Hematological: Negative.   Psychiatric/Behavioral: Negative.        Objective:   Physical Exam  Constitutional: She is oriented to person, place, and time. She appears well-developed and well-nourished.  HENT:  Head: Normocephalic and atraumatic.  Neck: Normal range of motion. Neck supple.  Cervical Paraspinal Tenderness: C-5-C-6 Mainly Right Side    Cardiovascular: Normal rate and regular rhythm.   Pulmonary/Chest: Effort normal and breath sounds normal.  Musculoskeletal:  Normal Muscle Bulk and Muscle Testing Reveals: Upper Extremities: Full ROM and Muscle Strength on the Right 5/5 and Left 4/5 Right AC Joint Tenderness Thoracic Paraspinal Tenderness: T-5-T-7 Lumbar Paraspinal Tenderness: L-4-L-5 Lower Extremities: Full ROM and Muscle Strength 5/5 Arises from chair with ease Narrow Based Gait  Neurological: She is alert and oriented to person, place, and time.  Skin: Skin is warm and dry.  Psychiatric: She has a normal mood and affect.  Nursing note and vitals reviewed.         Assessment & Plan:  1. Fibromyalgia with myofascial pain: Continue exercise and heat therapy. 04/29/2017 2. Rotator cuff syndrome on the right: Continue with stretching exercises and heat therapy. 09//20/2018 3. Cervicalgia. Post-laminectomy syndrome, facet arthropathy: Cervical Radiculopathy: Continue Gabapentin. 04/29/2017  S/ P  EMG with Dr. Naaman Plummer on 03/08/2017: Diagnosed with Moderate to Severe CTS of Left wrist: 04/29/2017 4. Depression: Continue Cymbalta. PCP increase dose to 90 mg daily. 04/29/2017 5. Mid/low back pain/ Lumbar Spondylosis: Continue Current Medication and stretching and heat therapy. Refilled: Hydrocodone 7.5/325 mg one tablet every 8 hours as needed for moderate pain #80. 04/29/2017 6. Lumbar Radiculopathy: Continue Gabapentin. 04/29/2017 7OA of right hand:  Continue using Voltaren gel and heat therapy. 04/29/2017 8. Migraines: No complaints today. Continue to Monitor  04/29/2017 9. Left CTS:S/P EMG on 03/08/2017: Awaiting surgery date with Dr. Apolonio Schneiders  For Carpal Tunnel Release.  04/29/2017  20 minutes of face to face patient care time was spent during this visit. All questions were encouraged and answered.   F/U in 48month

## 2017-05-05 ENCOUNTER — Encounter: Payer: Medicaid Other | Admitting: Registered Nurse

## 2017-05-17 ENCOUNTER — Telehealth: Payer: Self-pay

## 2017-05-17 NOTE — Telephone Encounter (Signed)
Submitted prior authorization to [facility] 05/17/2017, patient notified

## 2017-05-17 NOTE — Telephone Encounter (Signed)
She is needing a prior authorization of her hydrocodone.  Bruce has the PA form from pharmacy.

## 2017-05-17 NOTE — Telephone Encounter (Signed)
Patient called stating that it is time for her refill on hydrocodone and that it is on hold at [facility]. Patient said that [facility] told her that she would need to call medicaid to get it filled and for [place] to call her back.

## 2017-06-03 ENCOUNTER — Encounter: Payer: Medicaid Other | Attending: Physical Medicine and Rehabilitation | Admitting: Registered Nurse

## 2017-06-03 ENCOUNTER — Encounter: Payer: Self-pay | Admitting: Registered Nurse

## 2017-06-03 VITALS — BP 132/69 | HR 81

## 2017-06-03 DIAGNOSIS — M609 Myositis, unspecified: Secondary | ICD-10-CM | POA: Insufficient documentation

## 2017-06-03 DIAGNOSIS — M4716 Other spondylosis with myelopathy, lumbar region: Secondary | ICD-10-CM | POA: Diagnosis not present

## 2017-06-03 DIAGNOSIS — Z79899 Other long term (current) drug therapy: Secondary | ICD-10-CM | POA: Diagnosis not present

## 2017-06-03 DIAGNOSIS — M7918 Myalgia, other site: Secondary | ICD-10-CM | POA: Diagnosis not present

## 2017-06-03 DIAGNOSIS — F329 Major depressive disorder, single episode, unspecified: Secondary | ICD-10-CM

## 2017-06-03 DIAGNOSIS — M961 Postlaminectomy syndrome, not elsewhere classified: Secondary | ICD-10-CM | POA: Diagnosis present

## 2017-06-03 DIAGNOSIS — G894 Chronic pain syndrome: Secondary | ICD-10-CM | POA: Diagnosis not present

## 2017-06-03 DIAGNOSIS — Z5181 Encounter for therapeutic drug level monitoring: Secondary | ICD-10-CM | POA: Diagnosis not present

## 2017-06-03 DIAGNOSIS — G5602 Carpal tunnel syndrome, left upper limb: Secondary | ICD-10-CM | POA: Diagnosis not present

## 2017-06-03 DIAGNOSIS — M5010 Cervical disc disorder with radiculopathy, unspecified cervical region: Secondary | ICD-10-CM | POA: Diagnosis not present

## 2017-06-03 DIAGNOSIS — M546 Pain in thoracic spine: Secondary | ICD-10-CM

## 2017-06-03 DIAGNOSIS — G8929 Other chronic pain: Secondary | ICD-10-CM

## 2017-06-03 DIAGNOSIS — M797 Fibromyalgia: Secondary | ICD-10-CM | POA: Diagnosis not present

## 2017-06-03 MED ORDER — HYDROCODONE-ACETAMINOPHEN 7.5-325 MG PO TABS: 1.0000 | ORAL_TABLET | Freq: Three times a day (TID) | ORAL | 0 refills | Status: DC | PRN

## 2017-06-03 MED ORDER — MELOXICAM 7.5 MG PO TABS: ORAL_TABLET | ORAL | 4 refills | Status: DC

## 2017-06-03 NOTE — Progress Notes (Signed)
Subjective:    Patient ID: Robin Morgan, female    DOB: 1956-03-18, 61 y.o.   MRN: [id number]  HPI: Robin Morgan is a 61year old female who returns for follow up appointmentfor chronic pain and medication refill. She statesher pain is located in her neck, left hand with numbness and tingling and mid-lower back pain. She rates her pain 9. Her current exercise regime is walking and performing stretching exercises.  Ms. Marse Morphine equivalent is 22.22 MME  Last UDS was performed on 12/07/2016, it was consistent. Oral Swab performed today.   Pain Inventory Average Pain 7 Pain Right Now 9 My pain is constant and aching  In the last 24 hours, has pain interfered with the following? General activity 7 Relation with others 7 Enjoyment of life 8 What TIME of day is your pain at its worst? morning evening and night,night Sleep (in general) Fair  Pain is worse with: bending and standing Pain improves with: heat/ice and medication Relief from Meds: 7  Mobility walk without assistance ability to climb steps?  yes do you drive?  yes  Function disabled: date disabled .  Neuro/Psych bladder control problems numbness spasms  Prior Studies Any changes since last visit?  no  Physicians involved in your care Any changes since last visit?  no   Family History  Problem Relation Age of Onset  . Alzheimer's disease Mother   . Diabetes Father   . Heart disease Maternal Grandmother   . Breast cancer Paternal Grandmother 68   Social History   Social History  . Marital status: Divorced    Spouse name: [name]  . Number of children: 1  . Years of education: [name]   Social History Main Topics  . Smoking status: Never Smoker  . Smokeless tobacco: Never Used  . Alcohol use No  . Drug use: No  . Sexual activity: Not Asked   Other Topics Concern  . None   Social History Narrative  . None   Past Surgical History:  Procedure Laterality Date  . ABDOMINAL  HYSTERECTOMY    . APPENDECTOMY    . BREAST EXCISIONAL BIOPSY Right 1990's  . BREAST SURGERY     rt breast cyst  . CHOLECYSTECTOMY    . ROTATOR CUFF REPAIR     right  . SPINE SURGERY    . TONSILLECTOMY     Past Medical History:  Diagnosis Date  . Calcific tendonitis   . Cervical spondylosis without myelopathy   . Depression   . Diabetes mellitus   . Esophageal stricture   . Fibromyalgia   . GERD (gastroesophageal reflux disease)   . Hiatal hernia   . Hyperlipidemia   . Hypertension   . Myalgia   . Pain in joint, upper arm    BP 132/69   Pulse 81   SpO2 96%   Opioid Risk Score:  1 Fall Risk Score:  `1  Depression screen PHQ 2/9  Depression screen [facility] 2/9 06/03/2017 04/08/2016 10/08/2015 11/21/2014  Decreased Interest 1 2 2 2   Down, Depressed, Hopeless 1 0 0 0  PHQ - 2 Score 2 2 2 2   Altered sleeping - - - 2  Tired, decreased energy - - - 2  Change in appetite - - - 1  Feeling bad or failure about yourself  - - - 0  Trouble concentrating - - - 1  Moving slowly or fidgety/restless - - - 0  Suicidal thoughts - - - 0  PHQ-9  Score - - - 8    Review of Systems  Constitutional: Positive for unexpected weight change.  HENT: Negative.   Eyes: Negative.   Respiratory: Positive for apnea and cough.   Cardiovascular: Negative.   Gastrointestinal: Negative.   Endocrine: Negative.        High blood sugar  Genitourinary: Negative.   Musculoskeletal: Positive for arthralgias and back pain.       Spasms   Skin: Negative.   Allergic/Immunologic: Negative.   Neurological: Positive for numbness.  Hematological: Negative.   Psychiatric/Behavioral: Negative.        Objective:   Physical Exam  Constitutional: She is oriented to person, place, and time. She appears well-developed and well-nourished.  HENT:  Head: Normocephalic and atraumatic.  Neck: Normal range of motion. Neck supple.  Cervical Paraspinal Tenderness: C-5-C-6   Cardiovascular: Normal rate and regular  rhythm.   Pulmonary/Chest: Effort normal and breath sounds normal.  Musculoskeletal:  Normal Muscle Bulk and Muscle Testing Reveals: Upper Extremities: Full ROM and Muscle Strength on the Right 5/5 and Left 4/5 Thoracic Paraspinal Tenderness: T-7-T-9 Lumbar Paraspinal Tenderness: L-3-L-5 Lower Extremities: Full ROM and Muscle Strength 5/5 Arises from chair with ease Narrow Based Gait  Neurological: She is alert and oriented to person, place, and time.  Skin: Skin is warm and dry.  Psychiatric: She has a normal mood and affect.  Nursing note and vitals reviewed.         Assessment & Plan:  1. Fibromyalgia with myofascial pain: Continue exercise and heat therapy. 06/03/2017 2. Rotator cuff syndrome on the right: Continue with stretching exercises and heat therapy. 10//25/2018 3. Cervicalgia. Post-laminectomy syndrome, facet arthropathy: Cervical Radiculopathy: Continue Gabapentin. 06/03/2017  S/ P  EMG with Dr. Naaman Plummer on 03/08/2017: Diagnosed with Moderate to Severe CTS of Left wrist: 06/03/2017 4. Depression: Continue Cymbalta. PCP increase dose to 90 mg daily. 06/03/2017 5. Mid/low back pain/ Lumbar Spondylosis: Continue Current Medication and stretching and heat therapy. Refilled: Hydrocodone 7.5/325 mg one tablet every 8 hours as needed for moderate pain #80. 06/03/2017 6. Lumbar Radiculopathy: Continue Gabapentin. 06/03/2017 7OA of right hand:  Continue using Voltaren gel and heat therapy. 06/03/2017 8. Migraines: No complaints today. Continue to Monitor  06/03/2017 9. Left CTS:S/P EMG on 03/08/2017: Surgery date with Dr. Apolonio Schneiders  For Carpal Tunnel Release on 06/14/2017. 06/03/2017.   20 minutes of face to face patient care time was spent during this visit. All questions were encouraged and answered.   F/U in 36month

## 2017-06-10 LAB — DRUG TOX MONITOR 1 W/CONF, ORAL FLD
Amphetamines: NEGATIVE ng/mL (ref <10)
BARBITURATES: NEGATIVE ng/mL (ref <10)
BENZODIAZEPINES: NEGATIVE ng/mL (ref <0.50)
Buprenorphine: NEGATIVE ng/mL (ref <0.10)
Cocaine: NEGATIVE ng/mL (ref <5.0)
Codeine: NEGATIVE ng/mL (ref <2.5)
Dihydrocodeine: NEGATIVE ng/mL (ref <2.5)
Fentanyl: NEGATIVE ng/mL (ref <0.10)
HYDROCODONE: 14.1 ng/mL — AB (ref <2.5)
HYDROMORPHONE: NEGATIVE ng/mL (ref <2.5)
Heroin Metabolite: NEGATIVE ng/mL (ref <1.0)
MARIJUANA: NEGATIVE ng/mL (ref <2.5)
MDMA: NEGATIVE ng/mL (ref <10)
MEPROBAMATE: NEGATIVE ng/mL (ref <2.5)
METHADONE: NEGATIVE ng/mL (ref <5.0)
Morphine: NEGATIVE ng/mL (ref <2.5)
NICOTINE METABOLITE: NEGATIVE ng/mL (ref <5.0)
Norhydrocodone: NEGATIVE ng/mL (ref <2.5)
Noroxycodone: NEGATIVE ng/mL (ref <2.5)
Opiates: POSITIVE ng/mL — AB (ref <2.5)
Oxycodone: NEGATIVE ng/mL (ref <2.5)
Oxymorphone: NEGATIVE ng/mL (ref <2.5)
PHENCYCLIDINE: NEGATIVE ng/mL (ref <10)
TAPENTADOL: NEGATIVE ng/mL (ref <5.0)
TRAMADOL: NEGATIVE ng/mL (ref <5.0)
ZOLPIDEM: NEGATIVE ng/mL (ref <5.0)

## 2017-06-10 LAB — DRUG TOX ALC METAB W/CON, ORAL FLD: Alcohol Metabolite: NEGATIVE ng/mL (ref <25)

## 2017-06-14 ENCOUNTER — Telehealth: Payer: Self-pay | Admitting: *Deleted

## 2017-06-14 NOTE — Telephone Encounter (Signed)
Robin Morgan had her hand surgery today and she wanted to report that the doctor gave her hydrocodone 5/325 bid x 7 days. ( she already filled it but had not taken it out of the bag) I explained it could not be taken back to the pharmacy.  It is a lower dose than what she has but if she wants to take it she cannot take the rx from [place]. She will being it with her when she comes to her next appt.

## 2017-06-21 ENCOUNTER — Telehealth: Payer: Self-pay | Admitting: *Deleted

## 2017-06-21 NOTE — Telephone Encounter (Signed)
Oral swab drug screen was consistent for prescribed medications.  ?

## 2017-07-05 ENCOUNTER — Encounter: Payer: Self-pay | Admitting: Registered Nurse

## 2017-07-05 ENCOUNTER — Encounter: Payer: Medicaid Other | Attending: Physical Medicine and Rehabilitation | Admitting: Registered Nurse

## 2017-07-05 ENCOUNTER — Telehealth: Payer: Self-pay | Admitting: Registered Nurse

## 2017-07-05 VITALS — BP 119/63 | HR 89

## 2017-07-05 DIAGNOSIS — M797 Fibromyalgia: Secondary | ICD-10-CM

## 2017-07-05 DIAGNOSIS — Z5181 Encounter for therapeutic drug level monitoring: Secondary | ICD-10-CM

## 2017-07-05 DIAGNOSIS — M609 Myositis, unspecified: Secondary | ICD-10-CM | POA: Insufficient documentation

## 2017-07-05 DIAGNOSIS — G8929 Other chronic pain: Secondary | ICD-10-CM

## 2017-07-05 DIAGNOSIS — M5010 Cervical disc disorder with radiculopathy, unspecified cervical region: Secondary | ICD-10-CM

## 2017-07-05 DIAGNOSIS — G5602 Carpal tunnel syndrome, left upper limb: Secondary | ICD-10-CM | POA: Diagnosis not present

## 2017-07-05 DIAGNOSIS — M961 Postlaminectomy syndrome, not elsewhere classified: Secondary | ICD-10-CM

## 2017-07-05 DIAGNOSIS — Z79899 Other long term (current) drug therapy: Secondary | ICD-10-CM | POA: Diagnosis not present

## 2017-07-05 DIAGNOSIS — M542 Cervicalgia: Secondary | ICD-10-CM

## 2017-07-05 DIAGNOSIS — F329 Major depressive disorder, single episode, unspecified: Secondary | ICD-10-CM | POA: Diagnosis not present

## 2017-07-05 DIAGNOSIS — M546 Pain in thoracic spine: Secondary | ICD-10-CM

## 2017-07-05 DIAGNOSIS — M7918 Myalgia, other site: Secondary | ICD-10-CM

## 2017-07-05 NOTE — Telephone Encounter (Signed)
On 07/05/2017 the [place] was reviewed no conflict was seen on the [place] with multiple prescribers. Robin Morgan  has a signed narcotic contract with our office. If there were any discrepancies this would have been reported to her physician.

## 2017-07-05 NOTE — Progress Notes (Signed)
Subjective:    Patient ID: Robin Morgan, female    DOB: November 21, 1955, 61 y.o.   MRN: [id number]  HPI: Robin Morgan is a 61year old female who returns for follow up appointmentfor chronic pain and medication refill. She statesher pain is located in her neck radiating into her right shoulder and upper-mid back pain. She rates her pain 8. Her current exercise regime is walking and performing stretching exercises.  Robin Morgan had Left Carpal Tunnel Release Surgery by Dr. Apolonio Schneiders on 06/14/2017. Dr. Caralyn Guile prescribed Hydrocodone 5 mg tablets dispense 14 tablets, Robin Morgan brought the prescription with her. She was given permission to use this prescription then resume her Hydrocodone as ordered, she verbalizes understanding.  Robin Morgan Morphine equivalent is 22.22 MME  Last UDS was performed on 10/252018, it was consistent.   Pain Inventory Average Pain 7 Pain Right Now 8 My pain is constant and aching  In the last 24 hours, has pain interfered with the following? General activity 7 Relation with others 7 Enjoyment of life 7 What TIME of day is your pain at its worst? morning evening and night,night Sleep (in general) Fair  Pain is worse with: bending and standing Pain improves with: heat/ice and medication Relief from Meds: 7  Mobility walk without assistance ability to climb steps?  yes do you drive?  yes  Function disabled: date disabled .  Neuro/Psych bladder control problems numbness spasms  Prior Studies x-rays  Physicians involved in your care Orthopedist dr Apolonio Schneiders   Family History  Problem Relation Age of Onset  . Alzheimer's disease Mother   . Diabetes Father   . Heart disease Maternal Grandmother   . Breast cancer Paternal Grandmother 76   Social History   Socioeconomic History  . Marital status: Divorced    Spouse name: None  . Number of children: 1  . Years of education: None  . Highest education level: None  Social Needs  .  Financial resource strain: None  . Food insecurity - worry: None  . Food insecurity - inability: None  . Transportation needs - medical: None  . Transportation needs - non-medical: None  Occupational History  . None  Tobacco Use  . Smoking status: Never Smoker  . Smokeless tobacco: Never Used  Substance and Sexual Activity  . Alcohol use: No  . Drug use: No  . Sexual activity: None  Other Topics Concern  . None  Social History Narrative  . None   Past Surgical History:  Procedure Laterality Date  . ABDOMINAL HYSTERECTOMY    . APPENDECTOMY    . BREAST EXCISIONAL BIOPSY Right 1990's  . BREAST SURGERY     rt breast cyst  . CHOLECYSTECTOMY    . ROTATOR CUFF REPAIR     right  . SPINE SURGERY    . TONSILLECTOMY     Past Medical History:  Diagnosis Date  . Calcific tendonitis   . Cervical spondylosis without myelopathy   . Depression   . Diabetes mellitus   . Esophageal stricture   . Fibromyalgia   . GERD (gastroesophageal reflux disease)   . Hiatal hernia   . Hyperlipidemia   . Hypertension   . Myalgia   . Pain in joint, upper arm    Pulse 89   SpO2 96%   Opioid Risk Score:  1 Fall Risk Score:  `1  Depression screen PHQ 2/9  Depression screen [facility] 2/9 06/03/2017 04/08/2016 10/08/2015 11/21/2014  Decreased Interest 1 2 2  2  Down, Depressed, Hopeless 1 0 0 0  PHQ - 2 Score 2 2 2 2   Altered sleeping - - - 2  Tired, decreased energy - - - 2  Change in appetite - - - 1  Feeling bad or failure about yourself  - - - 0  Trouble concentrating - - - 1  Moving slowly or fidgety/restless - - - 0  Suicidal thoughts - - - 0  PHQ-9 Score - - - 8    Review of Systems  Constitutional: Positive for unexpected weight change.  HENT: Negative.   Eyes: Negative.   Respiratory: Positive for apnea and cough.   Cardiovascular: Negative.   Gastrointestinal: Negative.   Endocrine: Negative.        High blood sugar  Genitourinary: Negative.   Musculoskeletal: Positive for  arthralgias and back pain.       Spasms   Skin: Negative.   Allergic/Immunologic: Negative.   Neurological: Positive for numbness and headaches.  Hematological: Negative.   Psychiatric/Behavioral: Negative.        Objective:   Physical Exam  Constitutional: She is oriented to person, place, and time. She appears well-developed and well-nourished.  HENT:  Head: Normocephalic and atraumatic.  Neck: Normal range of motion. Neck supple.  Cervical Paraspinal Tenderness: C-5-C-6 Mainly Right Side  Cardiovascular: Normal rate and regular rhythm.  Pulmonary/Chest: Effort normal and breath sounds normal.  Musculoskeletal:  Normal Muscle Bulk and Muscle Testing Reveals: Upper Extremities: Full ROM and Muscle Strength on the Right 5/5 and Left 4/5 Right AC Joint Tenderness Thoracic Paraspinal Tenderness: T-7-T-9 Lower Extremities: Full ROM and Muscle Strength 5/5 Arises from chair with ease Narrow Based Gait  Neurological: She is alert and oriented to person, place, and time.  Skin: Skin is warm and dry.  Psychiatric: She has a normal mood and affect.  Nursing note and vitals reviewed.         Assessment & Plan:  1. Fibromyalgia with myofascial pain: Continue exercise and heat therapy. 07/05/2017. Continue Flexeril. 2. Rotator cuff syndrome on the right: Continue with stretching exercises and heat therapy. 11//26/2018 3. Cervicalgia. Post-laminectomy syndrome, facet arthropathy: Cervical Radiculopathy: Continue Gabapentin. 07/05/2017  S/ P  EMG with Dr. Naaman Plummer on 03/08/2017: Diagnosed with Moderate to Severe CTS of Left wrist: 07/05/2017 4. Depression: Continue Cymbalta. PCP increase dose to 90 mg daily. 07/05/2017 5. Mid/low back pain/ Lumbar Spondylosis: Continue Current Medication and stretching and heat therapy. 07/05/2017 Refilled: Hydrocodone 7.5/325 mg one tablet every 8 hours as needed for moderate pain #80. 06/03/2017 6. Lumbar Radiculopathy: Continue Gabapentin.  07/05/2017 7OA of right hand:  Continue using Voltaren gel and heat therapy. 07/05/2017 8. Migraines: No complaints today. Continue to Monitor  07/05/2017 9. Left CTS:S/P EMG on 03/08/2017: S/P Carpal Tunnel Release on 06/14/2017 via . Dr. Apolonio Schneiders. 07/05/2017.   20 minutes of face to face patient care time was spent during this visit. All questions were encouraged and answered.   F/U in 20month

## 2017-07-14 ENCOUNTER — Encounter: Payer: Self-pay | Admitting: Adult Health

## 2017-07-14 ENCOUNTER — Ambulatory Visit: Payer: Medicaid Other | Admitting: Adult Health

## 2017-07-14 VITALS — BP 115/61 | HR 99 | Ht 61.0 in | Wt 153.2 lb

## 2017-07-14 DIAGNOSIS — Z9989 Dependence on other enabling machines and devices: Secondary | ICD-10-CM | POA: Diagnosis not present

## 2017-07-14 DIAGNOSIS — G4733 Obstructive sleep apnea (adult) (pediatric): Secondary | ICD-10-CM

## 2017-07-14 NOTE — Patient Instructions (Signed)
Your Plan:  Continue using CPAP nightly If your symptoms worsen or you develop new symptoms please let us know.   Thank you for coming to see us at [facility]. I hope we have been able to provide you high quality care today.  You may receive a patient satisfaction survey over the next few weeks. We would appreciate your feedback and comments so that we may continue to improve ourselves and the health of our patients.  

## 2017-07-14 NOTE — Progress Notes (Addendum)
PATIENT: Robin Morgan DOB: 12/05/1955  REASON FOR VISIT: follow up HISTORY FROM: patient  HISTORY OF PRESENT ILLNESS: Today 07/14/17 Robin Morgan is a 61 year old female with a history of obstructive sleep apnea on CPAP.  She returns today for a compliance download.  Her download indicates that she use her machine 29 out of 30 days for compliance of 97%.  She used her machine greater than 4 hours 27 out of 30 days for compliance of 90%.  On average she uses her machine 5 hours and 56 minutes.  Her residual AHI is 1.7 on a minimum pressure of 7 cm of water and maximum pressure of 14 cm of water with EPR of 3.  She does not have a significant leak.  She reports that the CPAP continues to work well for her.  Her Epworth sleepiness score is 2.  She returns today for follow-up.  HISTORY July 13, 2016: Robin Morgan is a 61 year old female with a history of obstructive sleep apnea for CPAP. She returns today for an evaluation. She is currently using the nasal pillows. Her only concern with the CPAP is that the nasal pillows are irritating her nose. Her download indicates that she uses her machine 30 out of 30 days for compliance of 100%. She uses her machine greater than 4 hours 26 out of 30 days for compliance of 87%. Her residual AHI is 2.1 on a minimum pressure of 7 cm of water and maximum pressure 14 cm water. She does not have a significant leak. The patient's Epworth sleepiness score is 1 and fatigue severity score is 26. She feels that the CPAP is working well for her. However she would like to try a new mask to make it more comfortable to use. She returns today for an evaluation.  REVIEW OF SYSTEMS: Out of a complete 14 system review of symptoms, the patient complains only of the following symptoms, and all other reviewed systems are negative.  ALLERGIES: Allergies  Allergen Reactions  . Compazine Other (See Comments)    Seizure-like reaction  . Naproxen Itching    HOME  MEDICATIONS: Outpatient Medications Prior to Visit  Medication Sig Dispense Refill  . atorvastatin (LIPITOR) 40 MG tablet Take 40 mg by mouth daily.  3  . b complex vitamins tablet Take 1 tablet by mouth daily.    . Calcium Carbonate-Vitamin D (CALCIUM-VITAMIN D) 500-200 MG-UNIT per tablet Take 1 tablet by mouth 2 (two) times daily with a meal.    . co-enzyme Q-10 30 MG capsule Take 30 mg by mouth 3 (three) times daily.    . cyclobenzaprine (FLEXERIL) 10 MG tablet TAKE 1 TABLET (10 MG TOTAL) BY MOUTH EVERY 8 (EIGHT) HOURS AS NEEDED. 60 tablet 2  . diclofenac sodium (VOLTAREN) 1 % GEL Apply 2 g topically 4 (four) times daily. 3 Tube 3  . docusate calcium (SURFAK) 240 MG capsule Take 1 capsule by mouth daily.    . DULoxetine (CYMBALTA) 30 MG capsule take one 30 mg capsule with 60 mg capsule for total of 90 mg daily.  3  . DULoxetine (CYMBALTA) 60 MG capsule Take 60 mg by mouth daily. Take 60 mg capsule in addition to the 30 mg capsule for a total of 90 mg daily    . fenofibrate 160 MG tablet Take 160 mg by mouth daily.    Marland Kitchen gabapentin (NEURONTIN) 300 MG capsule Take by mouth. Take 2 capsules in the morning, 2 capsules at lunch and 2 at bedtime    .  glipiZIDE (GLUCOTROL XL) 5 MG 24 hr tablet Take 5 mg by mouth every morning.  2  . HYDROcodone-acetaminophen (NORCO) 7.5-325 MG tablet Take 1 tablet by mouth every 8 (eight) hours as needed for moderate pain. 80 tablet 0  . insulin glargine (LANTUS) 100 UNIT/ML injection Inject 52 Units into the skin at bedtime.     Marland Kitchen levothyroxine (SYNTHROID, LEVOTHROID) 50 MCG tablet Take 50 mcg by mouth daily.    Marland Kitchen lisinopril (PRINIVIL,ZESTRIL) 10 MG tablet Take 10 mg by mouth daily.    . meloxicam (MOBIC) 7.5 MG tablet TAKE 1 TABLET (7.5 MG TOTAL) BY MOUTH DAILY. 30 tablet 4  . metFORMIN (GLUCOPHAGE) 1000 MG tablet Take 1 tablet by mouth 2 (two) times daily.  3  . NEXIUM 20 MG capsule Take 1 capsule by mouth at bedtime.  1   No facility-administered medications  prior to visit.     PAST MEDICAL HISTORY: Past Medical History:  Diagnosis Date  . Calcific tendonitis   . Cervical spondylosis without myelopathy   . Depression   . Diabetes mellitus   . Esophageal stricture   . Fibromyalgia   . GERD (gastroesophageal reflux disease)   . Hiatal hernia   . Hyperlipidemia   . Hypertension   . Myalgia   . Pain in joint, upper arm     PAST SURGICAL HISTORY: Past Surgical History:  Procedure Laterality Date  . ABDOMINAL HYSTERECTOMY    . APPENDECTOMY    . BREAST EXCISIONAL BIOPSY Right 1990's  . BREAST SURGERY     rt breast cyst  . CHOLECYSTECTOMY    . ROTATOR CUFF REPAIR     right  . SPINE SURGERY    . TONSILLECTOMY      FAMILY HISTORY: Family History  Problem Relation Age of Onset  . Alzheimer's disease Mother   . Diabetes Father   . Heart disease Maternal Grandmother   . Breast cancer Paternal Grandmother 27    SOCIAL HISTORY: Social History   Socioeconomic History  . Marital status: Divorced    Spouse name: Not on file  . Number of children: 1  . Years of education: Not on file  . Highest education level: Not on file  Social Needs  . Financial resource strain: Not on file  . Food insecurity - worry: Not on file  . Food insecurity - inability: Not on file  . Transportation needs - medical: Not on file  . Transportation needs - non-medical: Not on file  Occupational History  . Not on file  Tobacco Use  . Smoking status: Never Smoker  . Smokeless tobacco: Never Used  Substance and Sexual Activity  . Alcohol use: No  . Drug use: No  . Sexual activity: Not on file  Other Topics Concern  . Not on file  Social History Narrative  . Not on file      PHYSICAL EXAM  Vitals:   07/14/17 1033  BP: 115/61  Pulse: 99  Weight: 153 lb 3.2 oz (69.5 kg)  Height: 5\' 1"  (1.549 m)   Body mass index is 28.95 kg/m.  Generalized: Well developed, in no acute distress   Neurological examination  Mentation: Alert oriented  to time, place, history taking. Follows all commands speech and language fluent Cranial nerve II-XII: Pupils were equal round reactive to light. Extraocular movements were full, visual field were full on confrontational test. Facial sensation and strength were normal. Uvula tongue midline. Head turning and shoulder shrug  were normal and symmetric. Motor:  The motor testing reveals 5 over 5 strength of all 4 extremities. Good symmetric motor tone is noted throughout.  Sensory: Sensory testing is intact to soft touch on all 4 extremities. No evidence of extinction is noted.  Coordination: Cerebellar testing reveals good finger-nose-finger and heel-to-shin bilaterally.  Gait and station: Gait is normal. Tandem gait is normal. Romberg is negative. No drift is seen.  Reflexes: Deep tendon reflexes are symmetric and normal bilaterally.   DIAGNOSTIC DATA (LABS, IMAGING, TESTING) - I reviewed patient records, labs, notes, testing and imaging myself where available.  Lab Results  Component Value Date   WBC 9.1 09/22/2016   HGB 13.3 09/22/2016   HCT 41.2 09/22/2016   MCV 86 09/22/2016   PLT 419 (H) 09/22/2016      Component Value Date/Time   NA 140 09/22/2016 1013   K 4.4 09/22/2016 1013   CL 99 09/22/2016 1013   CO2 24 09/22/2016 1013   GLUCOSE 93 09/22/2016 1013   BUN 18 09/22/2016 1013   CREATININE 0.97 09/22/2016 1013   CALCIUM 10.2 09/22/2016 1013   PROT 7.2 09/22/2016 1013   ALBUMIN 4.7 09/22/2016 1013   AST 36 09/22/2016 1013   ALT 36 (H) 09/22/2016 1013   ALKPHOS 54 09/22/2016 1013   BILITOT 0.4 09/22/2016 1013   GFRNONAA 62 09/22/2016 1013   GFRAA 72 09/22/2016 1013      ASSESSMENT AND PLAN 61 y.o. year old female  has a past medical history of Calcific tendonitis, Cervical spondylosis without myelopathy, Depression, Diabetes mellitus, Esophageal stricture, Fibromyalgia, GERD (gastroesophageal reflux disease), Hiatal hernia, Hyperlipidemia, Hypertension, Myalgia, and Pain in  joint, upper arm. here with :  1.  Obstructive sleep apnea on CPAP  Overall the patient is doing well.  She is encouraged to continue using the CPAP nightly and for greater than 4 hours each night.  Her download shows that she has excellent compliance and good treatment of her apnea.  She is advised that if her symptoms worsen or she develops new symptoms she should let us know. She will follow-up in 1 year or sooner if needed.  I spent 15 minutes with the patient. 50% of this time was spent reviewing her CPAP download  Ward Givens, MSN, NP-C 07/14/2017, 11:03 AM [facility] [address] [place], [place] [address] [phone]  I reviewed the above note and documentation by the Nurse Practitioner and agree with the history, physical exam, assessment and plan as outlined above. I was immediately available for face-to-face consultation.  Star Age, MD, PhD [facility])

## 2017-07-29 ENCOUNTER — Encounter: Payer: Medicaid Other | Attending: Physical Medicine and Rehabilitation | Admitting: Registered Nurse

## 2017-07-29 ENCOUNTER — Encounter: Payer: Self-pay | Admitting: Registered Nurse

## 2017-07-29 ENCOUNTER — Other Ambulatory Visit: Payer: Self-pay

## 2017-07-29 VITALS — BP 116/73 | HR 80

## 2017-07-29 DIAGNOSIS — G5602 Carpal tunnel syndrome, left upper limb: Secondary | ICD-10-CM

## 2017-07-29 DIAGNOSIS — Z5181 Encounter for therapeutic drug level monitoring: Secondary | ICD-10-CM

## 2017-07-29 DIAGNOSIS — M5010 Cervical disc disorder with radiculopathy, unspecified cervical region: Secondary | ICD-10-CM

## 2017-07-29 DIAGNOSIS — G8929 Other chronic pain: Secondary | ICD-10-CM

## 2017-07-29 DIAGNOSIS — Z79899 Other long term (current) drug therapy: Secondary | ICD-10-CM | POA: Diagnosis not present

## 2017-07-29 DIAGNOSIS — M961 Postlaminectomy syndrome, not elsewhere classified: Secondary | ICD-10-CM | POA: Diagnosis present

## 2017-07-29 DIAGNOSIS — F329 Major depressive disorder, single episode, unspecified: Secondary | ICD-10-CM

## 2017-07-29 DIAGNOSIS — G894 Chronic pain syndrome: Secondary | ICD-10-CM

## 2017-07-29 DIAGNOSIS — M797 Fibromyalgia: Secondary | ICD-10-CM | POA: Diagnosis not present

## 2017-07-29 DIAGNOSIS — M609 Myositis, unspecified: Secondary | ICD-10-CM | POA: Insufficient documentation

## 2017-07-29 DIAGNOSIS — M546 Pain in thoracic spine: Secondary | ICD-10-CM

## 2017-07-29 DIAGNOSIS — M7918 Myalgia, other site: Secondary | ICD-10-CM

## 2017-07-29 MED ORDER — HYDROCODONE-ACETAMINOPHEN 7.5-325 MG PO TABS: 1.0000 | ORAL_TABLET | Freq: Three times a day (TID) | ORAL | 0 refills | Status: DC | PRN

## 2017-07-29 NOTE — Progress Notes (Signed)
Subjective:    Patient ID: Robin Morgan, female    DOB: 11/09/1955, 61 y.o.   MRN: [id number]  HPI: [name] is a 61year old female who returns for follow up appointmentfor chronic pain and medication refill. She statesher pain is located in her mid back pain. She rates her pain 7. Her current exercise regime is walking and performing stretching exercises.   [name] had Left Carpal Tunnel Release Surgery by Dr. Apolonio Schneiders on 06/14/2017.  [name] Morphine equivalent is 23.08 MME  Last UDS was performed on 10/252018, it was consistent.   Pain Inventory Average Pain 8 Pain Right Now 7 My pain is constant  In the last 24 hours, has pain interfered with the following? General activity 6 Relation with others 6 Enjoyment of life 5 What TIME of day is your pain at its worst? daytime evening and night,night Sleep (in general) Fair  Pain is worse with: sitting and standing Pain improves with: heat/ice and medication Relief from Meds: 7  Mobility walk without assistance ability to climb steps?  yes do you drive?  yes  Function disabled: date disabled .  Neuro/Psych bladder control problems spasms depression  Prior Studies Any changes since last visit?  no  Physicians involved in your care Orthopedist dr Apolonio Schneiders   Family History  Problem Relation Age of Onset  . Alzheimer's disease Mother   . Diabetes Father   . Heart disease Maternal Grandmother   . Breast cancer Paternal Grandmother 63   Social History   Socioeconomic History  . Marital status: Divorced    Spouse name: Not on file  . Number of children: 1  . Years of education: Not on file  . Highest education level: Not on file  Social Needs  . Financial resource strain: Not on file  . Food insecurity - worry: Not on file  . Food insecurity - inability: Not on file  . Transportation needs - medical: Not on file  . Transportation needs - non-medical: Not on file  Occupational History    . Not on file  Tobacco Use  . Smoking status: Never Smoker  . Smokeless tobacco: Never Used  Substance and Sexual Activity  . Alcohol use: No  . Drug use: No  . Sexual activity: Not on file  Other Topics Concern  . Not on file  Social History Narrative  . Not on file   Past Surgical History:  Procedure Laterality Date  . ABDOMINAL HYSTERECTOMY    . APPENDECTOMY    . BREAST EXCISIONAL BIOPSY Right 1990's  . BREAST SURGERY     rt breast cyst  . CHOLECYSTECTOMY    . ROTATOR CUFF REPAIR     right  . SPINE SURGERY    . TONSILLECTOMY     Past Medical History:  Diagnosis Date  . Calcific tendonitis   . Cervical spondylosis without myelopathy   . Depression   . Diabetes mellitus   . Esophageal stricture   . Fibromyalgia   . GERD (gastroesophageal reflux disease)   . Hiatal hernia   . Hyperlipidemia   . Hypertension   . Myalgia   . Pain in joint, upper arm    BP 116/73   Pulse 80   SpO2 93%   Opioid Risk Score:  1 Fall Risk Score:  `1  Depression screen PHQ 2/9  Depression screen [facility] 2/9 07/29/2017 06/03/2017 04/08/2016 10/08/2015 11/21/2014  Decreased Interest 3 1 2 2 2   Down, Depressed, Hopeless 3  1 0 0 0  PHQ - 2 Score 6 2 2 2 2   Altered sleeping - - - - 2  Tired, decreased energy - - - - 2  Change in appetite - - - - 1  Feeling bad or failure about yourself  - - - - 0  Trouble concentrating - - - - 1  Moving slowly or fidgety/restless - - - - 0  Suicidal thoughts - - - - 0  PHQ-9 Score - - - - 8    Review of Systems  Constitutional: Positive for unexpected weight change.  HENT: Negative.   Eyes: Negative.   Respiratory: Positive for apnea and cough.   Cardiovascular: Negative.   Gastrointestinal: Negative.   Endocrine: Negative.        High blood sugar  Especially since having surgery  Genitourinary: Negative.   Musculoskeletal: Positive for arthralgias and back pain.       Spasms   Skin: Negative.   Allergic/Immunologic: Negative.    Neurological: Positive for numbness and headaches.  Hematological: Negative.   Psychiatric/Behavioral: Negative.   All other systems reviewed and are negative.      Objective:   Physical Exam  Constitutional: She is oriented to person, place, and time. She appears well-developed and well-nourished.  HENT:  Head: Normocephalic and atraumatic.  Neck: Normal range of motion. Neck supple.  Cardiovascular: Normal rate and regular rhythm.  Pulmonary/Chest: Effort normal and breath sounds normal.  Musculoskeletal:  Normal Muscle Bulk and Muscle Testing Reveals: Upper Extremities: Full ROM and Muscle Strength on the Right 5/5 and Left 4/5 Right AC Joint Tenderness Thoracic Paraspinal Tenderness: T-1-T-3 T-7-T-9 Lower Extremities: Full ROM and Muscle Strength 5/5 Arises from chair with ease Narrow Based Gait  Neurological: She is alert and oriented to person, place, and time.  Skin: Skin is warm and dry.  Psychiatric: She has a normal mood and affect.  Nursing note and vitals reviewed.         Assessment & Plan:  1. Fibromyalgia with myofascial pain: Continue exercise and heat therapy. 07/29/2017. Continue Flexeril. 2. Rotator cuff syndrome on the right: Continue with stretching exercises and heat therapy. 12//20/2018 3. Cervicalgia. Post-laminectomy syndrome, facet arthropathy: Cervical Radiculopathy: Continue Gabapentin. 07/29/2017  S/ P  EMG with Dr. Naaman Plummer on 03/08/2017: Diagnosed with Moderate to Severe CTS of Left wrist: 07/29/2017 4. Depression: Continue Cymbalta. PCP increase dose to 90 mg daily. 07/29/2017 5. Mid/low back pain/ Lumbar Spondylosis: Continue Current Medication and stretching and heat therapy. 07/29/2017 Refilled: Hydrocodone 7.5/325 mg one tablet every 8 hours as needed for moderate pain #80. 07/29/2017 6. Lumbar Radiculopathy: Continue Gabapentin. 07/29/2017 7OA of right hand:  Continue using Voltaren gel and heat therapy. 07/29/2017 8. Migraines: No  complaints today. Continue to Monitor  07/29/2017 9. Left CTS:S/P EMG on 03/08/2017: S/P Carpal Tunnel Release on 06/14/2017 via . Dr. Apolonio Schneiders. 07/29/2017.   20 minutes of face to face patient care time was spent during this visit. All questions were encouraged and answered.   F/U in 55month

## 2017-08-26 DIAGNOSIS — K219 Gastro-esophageal reflux disease without esophagitis: Secondary | ICD-10-CM | POA: Insufficient documentation

## 2017-08-26 DIAGNOSIS — R1310 Dysphagia, unspecified: Secondary | ICD-10-CM | POA: Insufficient documentation

## 2017-08-30 ENCOUNTER — Encounter: Payer: Medicaid Other | Attending: Physical Medicine and Rehabilitation | Admitting: Registered Nurse

## 2017-08-30 ENCOUNTER — Encounter: Payer: Self-pay | Admitting: Registered Nurse

## 2017-08-30 VITALS — BP 124/67 | HR 98

## 2017-08-30 DIAGNOSIS — G894 Chronic pain syndrome: Secondary | ICD-10-CM | POA: Diagnosis not present

## 2017-08-30 DIAGNOSIS — Z79899 Other long term (current) drug therapy: Secondary | ICD-10-CM | POA: Diagnosis not present

## 2017-08-30 DIAGNOSIS — Z5181 Encounter for therapeutic drug level monitoring: Secondary | ICD-10-CM | POA: Diagnosis not present

## 2017-08-30 DIAGNOSIS — M546 Pain in thoracic spine: Secondary | ICD-10-CM

## 2017-08-30 DIAGNOSIS — F329 Major depressive disorder, single episode, unspecified: Secondary | ICD-10-CM

## 2017-08-30 DIAGNOSIS — M609 Myositis, unspecified: Secondary | ICD-10-CM | POA: Insufficient documentation

## 2017-08-30 DIAGNOSIS — M797 Fibromyalgia: Secondary | ICD-10-CM

## 2017-08-30 DIAGNOSIS — M961 Postlaminectomy syndrome, not elsewhere classified: Secondary | ICD-10-CM

## 2017-08-30 DIAGNOSIS — G8929 Other chronic pain: Secondary | ICD-10-CM

## 2017-08-30 DIAGNOSIS — M5010 Cervical disc disorder with radiculopathy, unspecified cervical region: Secondary | ICD-10-CM

## 2017-08-30 DIAGNOSIS — M7918 Myalgia, other site: Secondary | ICD-10-CM

## 2017-08-30 MED ORDER — HYDROCODONE-ACETAMINOPHEN 7.5-325 MG PO TABS: 1.0000 | ORAL_TABLET | Freq: Three times a day (TID) | ORAL | 0 refills | Status: DC | PRN

## 2017-08-30 NOTE — Progress Notes (Signed)
Subjective:    Patient ID: Robin Morgan, female    DOB: 06/26/1956, 62 y.o.   MRN: [id number]  HPI: Robin Morgan is a 62year old female who returns for follow up appointmentfor chronic pain and medication refill. She states her pain is located in her neck radiating into her bilateral shoulders R>L and mid-back pain. She rates her pain 7. Her current exercise regime is walking and performing stretching exercises.   Robin Morgan had Left Carpal Tunnel Release Surgery by Dr. Apolonio Schneiders on 06/14/2017.  Robin Morgan Morphine equivalent is 20.77 MME  Last UDS was performed on 10/252018, it was consistent.   Pain Inventory Average Pain 7 Pain Right Now 7 My pain is constant  In the last 24 hours, has pain interfered with the following? General activity 7 Relation with others 7 Enjoyment of life 7 What TIME of day is your pain at its worst? daytime evening and night,night Sleep (in general) Fair  Pain is worse with: sitting and standing Pain improves with: heat/ice and medication Relief from Meds: 7  Mobility walk without assistance ability to climb steps?  yes do you drive?  yes  Function disabled: date disabled .  Neuro/Psych bladder control problems spasms depression  Prior Studies Any changes since last visit?  no  Physicians involved in your care Orthopedist dr Apolonio Schneiders   Family History  Problem Relation Age of Onset  . Alzheimer's disease Mother   . Diabetes Father   . Heart disease Maternal Grandmother   . Breast cancer Paternal Grandmother 98   Social History   Socioeconomic History  . Marital status: Divorced    Spouse name: None  . Number of children: 1  . Years of education: None  . Highest education level: None  Social Needs  . Financial resource strain: None  . Food insecurity - worry: None  . Food insecurity - inability: None  . Transportation needs - medical: None  . Transportation needs - non-medical: None  Occupational History  .  None  Tobacco Use  . Smoking status: Never Smoker  . Smokeless tobacco: Never Used  Substance and Sexual Activity  . Alcohol use: No  . Drug use: No  . Sexual activity: None  Other Topics Concern  . None  Social History Narrative  . None   Past Surgical History:  Procedure Laterality Date  . ABDOMINAL HYSTERECTOMY    . APPENDECTOMY    . BREAST EXCISIONAL BIOPSY Right 1990's  . BREAST SURGERY     rt breast cyst  . CHOLECYSTECTOMY    . ROTATOR CUFF REPAIR     right  . SPINE SURGERY    . TONSILLECTOMY     Past Medical History:  Diagnosis Date  . Calcific tendonitis   . Cervical spondylosis without myelopathy   . Depression   . Diabetes mellitus   . Esophageal stricture   . Fibromyalgia   . GERD (gastroesophageal reflux disease)   . Hiatal hernia   . Hyperlipidemia   . Hypertension   . Myalgia   . Pain in joint, upper arm    BP 124/67   Pulse 98   SpO2 95%   Opioid Risk Score:  1 Fall Risk Score:  `1  Depression screen PHQ 2/9  Depression screen Robin Morgan [facility] 2/9 07/29/2017 06/03/2017 04/08/2016 10/08/2015 11/21/2014  Decreased Interest 3 1 2 2 2   Down, Depressed, Hopeless 3 1 0 0 0  PHQ - 2 Score 6 2 2 2 2   Altered sleeping - - - -  2  Tired, decreased energy - - - - 2  Change in appetite - - - - 1  Feeling bad or failure about yourself  - - - - 0  Trouble concentrating - - - - 1  Moving slowly or fidgety/restless - - - - 0  Suicidal thoughts - - - - 0  PHQ-9 Score - - - - 8    Review of Systems  Constitutional: Positive for unexpected weight change.  HENT: Negative.   Eyes: Negative.   Respiratory: Positive for apnea and cough.   Cardiovascular: Negative.   Gastrointestinal: Negative.   Endocrine: Negative.        High blood sugar  Especially since having surgery  Genitourinary: Negative.   Musculoskeletal: Positive for arthralgias and back pain.       Spasms   Skin: Negative.   Allergic/Immunologic: Negative.   Neurological: Positive for numbness and  headaches.  Hematological: Negative.   Psychiatric/Behavioral: Negative.   All other systems reviewed and are negative.      Objective:   Physical Exam  Constitutional: She is oriented to person, place, and time. She appears well-developed and well-nourished.  HENT:  Head: Normocephalic and atraumatic.  Neck: Normal range of motion. Neck supple.  Cardiovascular: Normal rate and regular rhythm.  Pulmonary/Chest: Effort normal and breath sounds normal.  Musculoskeletal:  Normal Muscle Bulk and Muscle Testing Reveals: Upper Extremities: Full ROM and Muscle Strength 5/5 Thoracic Hypersensitivity: T-1-T-7 Lower Extremities: Full ROM and Muscle Strength 5/5 Arises from chair with ease Narrow Based Gait  Neurological: She is alert and oriented to person, place, and time.  Skin: Skin is warm and dry.  Psychiatric: She has a normal mood and affect.  Nursing note and vitals reviewed.         Assessment & Plan:  1. Fibromyalgia with myofascial pain: Continue exercise and heat therapy. 08/30/2017. Continue Flexeril. 2. Rotator cuff syndrome on the right: Continue with stretching exercises and heat therapy. 08/30/2017 3. Cervicalgia. Post-laminectomy syndrome, facet arthropathy: Cervical Radiculopathy: Continue Gabapentin. 08/30/2017  S/ P  EMG with Dr. Naaman Plummer on 03/08/2017: Diagnosed with Moderate to Severe CTS of Left wrist: 08/30/2017 4. Depression: Continue Cymbalta. PCP increase dose to 90 mg daily. 08/30/2017 5. Mid/low back pain/ Lumbar Spondylosis: Continue Current Medication and stretching and heat therapy. 08/30/2017 Refilled: Hydrocodone 7.5/325 mg one tablet every 8 hours as needed for moderate pain #80. 08/30/2017 6. Lumbar Radiculopathy: Continue Gabapentin. 08/30/2017 7OA of right hand:  Continue using Voltaren gel and heat therapy. 08/30/2017 8. Migraines: No complaints today. Continue to Monitor  08/30/2017 9. Left CTS:S/P EMG on 03/08/2017: S/P Carpal Tunnel Release  on 06/14/2017 via . Dr. Apolonio Schneiders. 08/30/2017 10. Myofascial Muscle Pain: Continue Flexeril. 08/30/2017  20 minutes of face to face patient care time was spent during this visit. All questions were encouraged and answered.   F/U in 56month

## 2017-09-03 LAB — DRUG TOX MONITOR 1 W/CONF, ORAL FLD
Amphetamines: NEGATIVE ng/mL (ref <10)
BENZODIAZEPINES: NEGATIVE ng/mL (ref <0.50)
BUPRENORPHINE: NEGATIVE ng/mL (ref <0.10)
Barbiturates: NEGATIVE ng/mL (ref <10)
CODEINE: NEGATIVE ng/mL (ref <2.5)
Cocaine: NEGATIVE ng/mL (ref <5.0)
DIHYDROCODEINE: NEGATIVE ng/mL (ref <2.5)
Fentanyl: NEGATIVE ng/mL (ref <0.10)
HYDROMORPHONE: NEGATIVE ng/mL (ref <2.5)
Heroin Metabolite: NEGATIVE ng/mL (ref <1.0)
Hydrocodone: 25.4 ng/mL — ABNORMAL HIGH (ref <2.5)
MARIJUANA: NEGATIVE ng/mL (ref <2.5)
MDMA: NEGATIVE ng/mL (ref <10)
METHADONE: NEGATIVE ng/mL (ref <5.0)
MORPHINE: NEGATIVE ng/mL (ref <2.5)
Meprobamate: NEGATIVE ng/mL (ref <2.5)
NORHYDROCODONE: NEGATIVE ng/mL (ref <2.5)
Nicotine Metabolite: NEGATIVE ng/mL (ref <5.0)
Noroxycodone: NEGATIVE ng/mL (ref <2.5)
OXYCODONE: NEGATIVE ng/mL (ref <2.5)
Opiates: POSITIVE ng/mL — AB (ref <2.5)
Oxymorphone: NEGATIVE ng/mL (ref <2.5)
Phencyclidine: NEGATIVE ng/mL (ref <10)
TAPENTADOL: NEGATIVE ng/mL (ref <5.0)
TRAMADOL: NEGATIVE ng/mL (ref <5.0)
ZOLPIDEM: POSITIVE ng/mL — AB (ref <5.0)
Zolpidem: 5.2 ng/mL — ABNORMAL HIGH (ref <5.0)

## 2017-09-03 LAB — DRUG TOX ALC METAB W/CON, ORAL FLD: ALCOHOL METABOLITE: NEGATIVE ng/mL (ref <25)

## 2017-09-07 ENCOUNTER — Telehealth: Payer: Self-pay | Admitting: *Deleted

## 2017-09-07 NOTE — Telephone Encounter (Signed)
Oral swab drug screen was consistent for prescribed medications.  ?

## 2017-09-29 ENCOUNTER — Encounter: Payer: Self-pay | Admitting: Physical Medicine & Rehabilitation

## 2017-09-29 ENCOUNTER — Encounter: Payer: Medicaid Other | Attending: Physical Medicine and Rehabilitation | Admitting: Physical Medicine & Rehabilitation

## 2017-09-29 VITALS — BP 96/55 | HR 81

## 2017-09-29 DIAGNOSIS — G5602 Carpal tunnel syndrome, left upper limb: Secondary | ICD-10-CM | POA: Diagnosis not present

## 2017-09-29 DIAGNOSIS — M5010 Cervical disc disorder with radiculopathy, unspecified cervical region: Secondary | ICD-10-CM

## 2017-09-29 DIAGNOSIS — M609 Myositis, unspecified: Secondary | ICD-10-CM | POA: Diagnosis not present

## 2017-09-29 DIAGNOSIS — M7918 Myalgia, other site: Secondary | ICD-10-CM | POA: Diagnosis not present

## 2017-09-29 DIAGNOSIS — G894 Chronic pain syndrome: Secondary | ICD-10-CM

## 2017-09-29 DIAGNOSIS — M797 Fibromyalgia: Secondary | ICD-10-CM

## 2017-09-29 DIAGNOSIS — M961 Postlaminectomy syndrome, not elsewhere classified: Secondary | ICD-10-CM | POA: Diagnosis present

## 2017-09-29 MED ORDER — DICLOFENAC SODIUM 50 MG PO TBEC
50.0000 mg | DELAYED_RELEASE_TABLET | Freq: Two times a day (BID) | ORAL | 3 refills | Status: DC
Start: 2017-09-29 — End: 2017-12-31

## 2017-09-29 MED ORDER — GABAPENTIN 300 MG PO CAPS: 300.0000 mg | ORAL_CAPSULE | ORAL | 3 refills | Status: DC

## 2017-09-29 MED ORDER — HYDROCODONE-ACETAMINOPHEN 7.5-325 MG PO TABS: 1.0000 | ORAL_TABLET | Freq: Three times a day (TID) | ORAL | 0 refills | Status: DC | PRN

## 2017-09-29 NOTE — Patient Instructions (Signed)
PLEASE FEEL FREE TO CALL OUR OFFICE WITH ANY PROBLEMS OR QUESTIONS ([phone])   WORK ON MASSAGING YOUR HANDS AND DESENSITIZE YOUR LEFT WRIST.

## 2017-09-29 NOTE — Progress Notes (Signed)
Subjective:    Patient ID: Robin Morgan, female    DOB: 10/10/1955, 62 y.o.   MRN: [id number]  HPI   Robin Morgan is here in follow up of her chronic pain. She had CTS release in November by Dr. Apolonio Schneiders. She reports improvement in tingling and numbness in her left hand but she has noticed some ongoing popping/pain in her thumb. It seems to be worse with movement and often it's quite stiff in the AM.  She also has complained about pain along her medial right shoulder blade. She feels that it also radiates to her neck and worsens with activity.  She is seeing ENT this spring as she has concerns that it might be also related to a "lump" in her neck.   She remains on gabapentin and hydrocodone for her generalized pain in addition to cymbalta and mobic/voltaren gel.    Pain Inventory Average Pain 8 Pain Right Now 8 My pain is constant  In the last 24 hours, has pain interfered with the following? General activity 8 Relation with others 8 Enjoyment of life 8 What TIME of day is your pain at its worst? daytime Sleep (in general) Fair  Pain is worse with: sitting Pain improves with: n/a Relief from Meds: 0  Mobility ability to climb steps?  yes do you drive?  yes  Function disabled: date disabled n/a  Neuro/Psych bladder control problems spasms  Prior Studies Any changes since last visit?  no  Physicians involved in your care Any changes since last visit?  no   Family History  Problem Relation Age of Onset  . Alzheimer's disease Mother   . Diabetes Father   . Heart disease Maternal Grandmother   . Breast cancer Paternal Grandmother 36   Social History   Socioeconomic History  . Marital status: Divorced    Spouse name: Not on file  . Number of children: 1  . Years of education: Not on file  . Highest education level: Not on file  Social Needs  . Financial resource strain: Not on file  . Food insecurity - worry: Not on file  . Food insecurity - inability: Not on  file  . Transportation needs - medical: Not on file  . Transportation needs - non-medical: Not on file  Occupational History  . Not on file  Tobacco Use  . Smoking status: Never Smoker  . Smokeless tobacco: Never Used  Substance and Sexual Activity  . Alcohol use: No  . Drug use: No  . Sexual activity: Not on file  Other Topics Concern  . Not on file  Social History Narrative  . Not on file   Past Surgical History:  Procedure Laterality Date  . ABDOMINAL HYSTERECTOMY    . APPENDECTOMY    . BREAST EXCISIONAL BIOPSY Right 1990's  . BREAST SURGERY     rt breast cyst  . CHOLECYSTECTOMY    . ROTATOR CUFF REPAIR     right  . SPINE SURGERY    . TONSILLECTOMY     Past Medical History:  Diagnosis Date  . Calcific tendonitis   . Cervical spondylosis without myelopathy   . Depression   . Diabetes mellitus   . Esophageal stricture   . Fibromyalgia   . GERD (gastroesophageal reflux disease)   . Hiatal hernia   . Hyperlipidemia   . Hypertension   . Myalgia   . Pain in joint, upper arm    There were no vitals taken for this visit.  Opioid  Risk Score:   Fall Risk Score:  `1  Depression screen PHQ 2/9  Depression screen [facility] 2/9 07/29/2017 06/03/2017 04/08/2016 10/08/2015 11/21/2014  Decreased Interest 3 1 2 2 2   Down, Depressed, Hopeless 3 1 0 0 0  PHQ - 2 Score 6 2 2 2 2   Altered sleeping - - - - 2  Tired, decreased energy - - - - 2  Change in appetite - - - - 1  Feeling bad or failure about yourself  - - - - 0  Trouble concentrating - - - - 1  Moving slowly or fidgety/restless - - - - 0  Suicidal thoughts - - - - 0  PHQ-9 Score - - - - 8     Review of Systems  Constitutional: Positive for unexpected weight change.  Respiratory: Positive for apnea and cough.   Gastrointestinal: Positive for constipation.  Endocrine:       High blood sugar and Low blood sugar   Genitourinary:       Bladder control problems  Neurological:       Spasms  All other systems  reviewed and are negative.      Objective:   Physical Exam  Constitutional: She is oriented to person, place, and time. She appears well-developed and well-nourished. Remains a little overweight  HENT:  Head: Normocephalic and atraumatic.  Eyes: PERRL Neck: fair cervical ROM.  Cardiovascular: reg rate Pulmonary/Chest: normal effort.  Abdominal: Soft. Bowel sounds are normal. She exhibits no distension.  Musculoskeletal: U generalized lumbar paraspinal tenderness.  Range of motion is generally stable.  She continues to have a mild kyphotic posture as well.  Notable trigger point along the right middle rhomboid which reproduce pain with palpation.  Patient has tenderness along the left medial thumb just lateral to her carpal tunnel surgery site.  Pain with resisted flexion as well as extension.  Finkelstein's test equivocal to positive on the left and positive on the right today.    Neurology:No sensory deficit. DTR's are 2+ in both ue's. 1+ in the LE's although she has difficulties relaxing.   Motor and sensory exam are stable. Marland Kitchen Psychiatric: She is pleasant and appropriate  Assessment & Plan:  ASSESSMENT:  1. Fibromyalgia with myofascial pain. - 2. Rotator cuff syndrome on the right.  3. History of repair was with chronic pain.  4. Cervicalgia. Post-laminectomy syndrome, facet arthropathy symptoms, associated myofascial pain and headaches. Symptoms improved after translaminar C7-T1 ESI (right). Persistent myofascial pain. See no signs of radiculopathy on exam.  5. Depression- which has improved  7. Mid/low back pain-- xrays of lumbar spine from 2014 show DDD in the upper lumbar segements. Anterior sendysmosisat L2-3--- AS?  -Myofascial component to thoracic pain 8. Decreased bone density  10. Trigger finger left middle   PLAN:   1. After informed consent and preparation of the skin with isopropyl alcohol, I injected the right middle rhomboids with 2cc of 1%  lidocaine. The patient tolerated well, and no complications were experienced. Post-injection instructions were provided.  2. Hydrocodone 5/325 one-half to one p.o. TID prn #80 3.   Recommend an extensive massage and desensitization to the left thumb and hand region near her carpal tunnel surgery site.  Should include massage as well as hot and cold therapy and texture therapy as well. 4.  DC Mobic and begin trial of diclofenac oral 50 mg every 12 hours with food.  Increase nighttime gabapentin to 900 mg.  She will continue 600 twice daily. 5.  Consider right wrist injection.  Patient to discuss further with orthopedic surgery regarding the left wrist although she should expect to need further time to recover given the severity of her median neuropathy and given her chronic pain/fibromyalgia syndrome   Follow up with me in about a month as scheduled.   15 minutes of face to face patient care time were spent during this visit. All questions were encouraged and answered.

## 2017-10-07 DIAGNOSIS — M65312 Trigger thumb, left thumb: Secondary | ICD-10-CM | POA: Insufficient documentation

## 2017-10-07 DIAGNOSIS — M659 Synovitis and tenosynovitis, unspecified: Secondary | ICD-10-CM | POA: Insufficient documentation

## 2017-10-08 ENCOUNTER — Other Ambulatory Visit: Payer: Self-pay | Admitting: Physician Assistant

## 2017-10-08 DIAGNOSIS — R1310 Dysphagia, unspecified: Secondary | ICD-10-CM

## 2017-10-08 DIAGNOSIS — K219 Gastro-esophageal reflux disease without esophagitis: Secondary | ICD-10-CM

## 2017-10-08 DIAGNOSIS — M542 Cervicalgia: Secondary | ICD-10-CM

## 2017-10-13 ENCOUNTER — Other Ambulatory Visit: Payer: Self-pay | Admitting: Family Medicine

## 2017-10-13 DIAGNOSIS — M810 Age-related osteoporosis without current pathological fracture: Secondary | ICD-10-CM

## 2017-10-18 ENCOUNTER — Other Ambulatory Visit: Payer: Medicaid Other

## 2017-10-26 ENCOUNTER — Other Ambulatory Visit: Payer: Medicaid Other

## 2017-10-26 ENCOUNTER — Ambulatory Visit
Admission: RE | Admit: 2017-10-26 | Discharge: 2017-10-26 | Disposition: A | Discharge status: Home / Self Care | Payer: Medicaid Other | Source: Ambulatory Visit | Attending: Physician Assistant | Admitting: Physician Assistant

## 2017-10-26 DIAGNOSIS — R1310 Dysphagia, unspecified: Secondary | ICD-10-CM

## 2017-10-26 DIAGNOSIS — K219 Gastro-esophageal reflux disease without esophagitis: Secondary | ICD-10-CM

## 2017-10-26 DIAGNOSIS — M542 Cervicalgia: Secondary | ICD-10-CM

## 2017-10-27 ENCOUNTER — Encounter: Payer: Self-pay | Admitting: Registered Nurse

## 2017-10-27 ENCOUNTER — Other Ambulatory Visit: Payer: Self-pay

## 2017-10-27 ENCOUNTER — Encounter: Payer: Medicaid Other | Attending: Physical Medicine and Rehabilitation | Admitting: Registered Nurse

## 2017-10-27 VITALS — BP 124/61 | HR 88

## 2017-10-27 DIAGNOSIS — M546 Pain in thoracic spine: Secondary | ICD-10-CM

## 2017-10-27 DIAGNOSIS — G8929 Other chronic pain: Secondary | ICD-10-CM | POA: Diagnosis not present

## 2017-10-27 DIAGNOSIS — M542 Cervicalgia: Secondary | ICD-10-CM

## 2017-10-27 DIAGNOSIS — M797 Fibromyalgia: Secondary | ICD-10-CM | POA: Diagnosis not present

## 2017-10-27 DIAGNOSIS — G894 Chronic pain syndrome: Secondary | ICD-10-CM

## 2017-10-27 DIAGNOSIS — M7918 Myalgia, other site: Secondary | ICD-10-CM

## 2017-10-27 DIAGNOSIS — M609 Myositis, unspecified: Secondary | ICD-10-CM | POA: Insufficient documentation

## 2017-10-27 DIAGNOSIS — Z5181 Encounter for therapeutic drug level monitoring: Secondary | ICD-10-CM

## 2017-10-27 DIAGNOSIS — Z79899 Other long term (current) drug therapy: Secondary | ICD-10-CM | POA: Diagnosis not present

## 2017-10-27 DIAGNOSIS — G5602 Carpal tunnel syndrome, left upper limb: Secondary | ICD-10-CM

## 2017-10-27 DIAGNOSIS — M5010 Cervical disc disorder with radiculopathy, unspecified cervical region: Secondary | ICD-10-CM

## 2017-10-27 DIAGNOSIS — M961 Postlaminectomy syndrome, not elsewhere classified: Secondary | ICD-10-CM | POA: Diagnosis present

## 2017-10-27 MED ORDER — HYDROCODONE-ACETAMINOPHEN 7.5-325 MG PO TABS: 1.0000 | ORAL_TABLET | Freq: Three times a day (TID) | ORAL | 0 refills | Status: DC | PRN

## 2017-10-27 MED ORDER — CYCLOBENZAPRINE HCL 10 MG PO TABS: ORAL_TABLET | ORAL | 2 refills | Status: DC

## 2017-10-27 NOTE — Progress Notes (Signed)
Subjective:    Patient ID: Robin Morgan, female    DOB: [birth year], 62 y.o.   MRN: [id number]  HPI: [name] is a 62year old female who returns for follow up appointmentfor chronic pain and medication refill. She states her pain is located in her neck radiating into her right shoulder and upper-mid back. She rates her pain 7. Her current exercise regime is walking and performing stretching exercises.   [name] had Left Carpal Tunnel Release Surgery by Dr. Apolonio Schneiders on 06/14/2017.  [name] Morphine equivalent is 24.62 MME  Last Oral Swab was performed on 08/30/2017, it was consistent.   Pain Inventory Average Pain 7 Pain Right Now 7 My pain is constant  In the last 24 hours, has pain interfered with the following? General activity 7 Relation with others 7 Enjoyment of life 7 What TIME of day is your pain at its worst? daytime evening and night,night Sleep (in general) Fair  Pain is worse with: sitting and standing Pain improves with: heat/ice and medication Relief from Meds: 7  Mobility walk without assistance ability to climb steps?  yes do you drive?  yes  Function disabled: date disabled .  Neuro/Psych bladder control problems spasms depression  Prior Studies Any changes since last visit?  no  Physicians involved in your care Orthopedist dr Apolonio Schneiders   Family History  Problem Relation Age of Onset  . Alzheimer's disease Mother   . Diabetes Father   . Heart disease Maternal Grandmother   . Breast cancer Paternal Grandmother 23   Social History   Socioeconomic History  . Marital status: Divorced    Spouse name: Not on file  . Number of children: 1  . Years of education: Not on file  . Highest education level: Not on file  Social Needs  . Financial resource strain: Not on file  . Food insecurity - worry: Not on file  . Food insecurity - inability: Not on file  . Transportation needs - medical: Not on file  . Transportation needs -  non-medical: Not on file  Occupational History  . Not on file  Tobacco Use  . Smoking status: Never Smoker  . Smokeless tobacco: Never Used  Substance and Sexual Activity  . Alcohol use: No  . Drug use: No  . Sexual activity: Not on file  Other Topics Concern  . Not on file  Social History Narrative  . Not on file   Past Surgical History:  Procedure Laterality Date  . ABDOMINAL HYSTERECTOMY    . APPENDECTOMY    . BREAST EXCISIONAL BIOPSY Right 1990's  . BREAST SURGERY     rt breast cyst  . CHOLECYSTECTOMY    . ROTATOR CUFF REPAIR     right  . SPINE SURGERY    . TONSILLECTOMY     Past Medical History:  Diagnosis Date  . Calcific tendonitis   . Cervical spondylosis without myelopathy   . Depression   . Diabetes mellitus   . Esophageal stricture   . Fibromyalgia   . GERD (gastroesophageal reflux disease)   . Hiatal hernia   . Hyperlipidemia   . Hypertension   . Myalgia   . Pain in joint, upper arm    There were no vitals taken for this visit.  Opioid Risk Score:  1 Fall Risk Score:  `1  Depression screen PHQ 2/9  Depression screen [facility] 2/9 10/27/2017 07/29/2017 06/03/2017 04/08/2016 10/08/2015 11/21/2014  Decreased Interest 3 3 1 2  2  2  Down, Depressed, Hopeless 3 3 1  0 0 0  PHQ - 2 Score 6 6 2 2 2 2   Altered sleeping - - - - - 2  Tired, decreased energy - - - - - 2  Change in appetite - - - - - 1  Feeling bad or failure about yourself  - - - - - 0  Trouble concentrating - - - - - 1  Moving slowly or fidgety/restless - - - - - 0  Suicidal thoughts - - - - - 0  PHQ-9 Score - - - - - 8    Review of Systems  Constitutional: Positive for unexpected weight change.  HENT: Negative.   Eyes: Negative.   Respiratory: Positive for apnea and cough.   Cardiovascular: Negative.   Gastrointestinal: Negative.   Endocrine: Negative.        High blood sugar  Especially since having surgery  Genitourinary: Negative.   Musculoskeletal: Positive for arthralgias and back  pain.       Spasms   Skin: Negative.   Allergic/Immunologic: Negative.   Neurological: Positive for numbness and headaches.  Hematological: Negative.   Psychiatric/Behavioral: Negative.   All other systems reviewed and are negative.      Objective:   Physical Exam  Constitutional: She is oriented to person, place, and time. She appears well-developed and well-nourished.  HENT:  Head: Normocephalic and atraumatic.  Neck: Normal range of motion. Neck supple.  Cervical Paraspinal Tenderness: C-5-C-6 Mainly Right Side  Cardiovascular: Normal rate and regular rhythm.  Pulmonary/Chest: Effort normal and breath sounds normal.  Musculoskeletal:  Normal Muscle Bulk and Muscle Testing Reveals: Upper Extremities: Full ROM and Muscle Strength 5/5 Right AC Joint Tenderness Thoracic Paraspinal Tenderness: T-7-T-9 Lower Extremities: Full ROM and Muscle Strength 5/5 Arises from chair with ease Narrow Based Gait  Neurological: She is alert and oriented to person, place, and time.  Skin: Skin is warm and dry.  Psychiatric: She has a normal mood and affect.  Nursing note and vitals reviewed.         Assessment & Plan:  1. Fibromyalgia with myofascial pain: Continue exercise and heat therapy. 10/27/2017. Continue Flexeril. 2. Rotator cuff syndrome on the right: Continue with stretching exercises and heat therapy. 10/27/2017 3. Cervicalgia. Post-laminectomy syndrome, facet arthropathy: Cervical Radiculopathy: Continue Gabapentin. 10/27/2017  S/ P  EMG with Dr. Naaman Plummer on 03/08/2017: Diagnosed with Moderate to Severe CTS of Left wrist: 10/27/2017 4. Depression: Continue Cymbalta. PCP increase dose to 90 mg daily. 10/27/2017 5. Mid/low back pain/ Lumbar Spondylosis: Continue Current Medication and stretching and heat therapy. 10/27/2017 Refilled: Hydrocodone 7.5/325 mg one tablet every 8 hours as needed for moderate pain #80. 10/27/2017 6. Lumbar Radiculopathy: No complaints today. Continue  Gabapentin. 10/27/2017 7OA of right hand:  Continue using Voltaren gel and heat therapy. 10/27/2017 8. Migraines: No complaints today. Continue to Monitor  10/27/2017 9. Left CTS:S/P EMG on 03/08/2017: S/P Carpal Tunnel Release on 06/14/2017 via . Dr. Apolonio Schneiders. 10/27/2017  20 minutes of face to face patient care time was spent during this visit. All questions were encouraged and answered.   F/U in 60month

## 2017-11-03 ENCOUNTER — Ambulatory Visit
Admission: RE | Admit: 2017-11-03 | Discharge: 2017-11-03 | Disposition: A | Discharge status: Home / Self Care | Payer: Medicaid Other | Source: Ambulatory Visit | Attending: Family Medicine | Admitting: Family Medicine

## 2017-11-03 DIAGNOSIS — M810 Age-related osteoporosis without current pathological fracture: Secondary | ICD-10-CM

## 2017-11-05 ENCOUNTER — Ambulatory Visit
Admission: RE | Admit: 2017-11-05 | Discharge: 2017-11-05 | Disposition: A | Discharge status: Home / Self Care | Payer: Medicaid Other | Source: Ambulatory Visit | Attending: Physician Assistant | Admitting: Physician Assistant

## 2017-11-05 DIAGNOSIS — R1310 Dysphagia, unspecified: Secondary | ICD-10-CM

## 2017-11-05 DIAGNOSIS — K219 Gastro-esophageal reflux disease without esophagitis: Secondary | ICD-10-CM

## 2017-11-05 DIAGNOSIS — M542 Cervicalgia: Secondary | ICD-10-CM

## 2017-11-05 MED ORDER — IOPAMIDOL (ISOVUE-300) INJECTION 61%
75.0000 mL | Freq: Once | INTRAVENOUS | Status: AC | PRN
  Administered 2017-11-05: 75 mL via INTRAVENOUS

## 2017-11-09 ENCOUNTER — Encounter: Payer: Self-pay | Admitting: Family Medicine

## 2017-11-09 ENCOUNTER — Other Ambulatory Visit: Payer: Self-pay

## 2017-11-09 ENCOUNTER — Ambulatory Visit: Payer: Medicaid Other | Admitting: Family Medicine

## 2017-11-09 VITALS — BP 126/76 | HR 96 | Temp 98.3°F | Resp 15 | Ht 61.25 in | Wt 149.6 lb

## 2017-11-09 DIAGNOSIS — Z794 Long term (current) use of insulin: Secondary | ICD-10-CM | POA: Diagnosis not present

## 2017-11-09 DIAGNOSIS — M8580 Other specified disorders of bone density and structure, unspecified site: Secondary | ICD-10-CM | POA: Diagnosis not present

## 2017-11-09 DIAGNOSIS — M797 Fibromyalgia: Secondary | ICD-10-CM | POA: Diagnosis not present

## 2017-11-09 DIAGNOSIS — G894 Chronic pain syndrome: Secondary | ICD-10-CM

## 2017-11-09 DIAGNOSIS — K222 Esophageal obstruction: Secondary | ICD-10-CM | POA: Diagnosis not present

## 2017-11-09 DIAGNOSIS — E1140 Type 2 diabetes mellitus with diabetic neuropathy, unspecified: Secondary | ICD-10-CM | POA: Diagnosis not present

## 2017-11-09 DIAGNOSIS — K219 Gastro-esophageal reflux disease without esophagitis: Secondary | ICD-10-CM | POA: Diagnosis not present

## 2017-11-09 DIAGNOSIS — M5136 Other intervertebral disc degeneration, lumbar region: Secondary | ICD-10-CM | POA: Diagnosis not present

## 2017-11-09 DIAGNOSIS — I10 Essential (primary) hypertension: Secondary | ICD-10-CM

## 2017-11-09 DIAGNOSIS — F1190 Opioid use, unspecified, uncomplicated: Secondary | ICD-10-CM

## 2017-11-09 DIAGNOSIS — E782 Mixed hyperlipidemia: Secondary | ICD-10-CM | POA: Diagnosis not present

## 2017-11-09 NOTE — Patient Instructions (Signed)
It was so good seeing you again! Thank you for establishing with my new practice and allowing me to continue caring for you. It means a lot to me.   Please schedule a follow up appointment with me in 3 months to recheck diabetes.   Your diabetes is controlled.    Diabetes Mellitus and Nutrition When you have diabetes (diabetes mellitus), it is very important to have healthy eating habits because your blood sugar (glucose) levels are greatly affected by what you eat and drink. Eating healthy foods in the appropriate amounts, at about the same times every day, can help you:  Control your blood glucose.  Lower your risk of heart disease.  Improve your blood pressure.  Reach or maintain a healthy weight.  Every person with diabetes is different, and each person has different needs for a meal plan. Your health care provider may recommend that you work with a diet and nutrition specialist (dietitian) to make a meal plan that is best for you. Your meal plan may vary depending on factors such as:  The calories you need.  The medicines you take.  Your weight.  Your blood glucose, blood pressure, and cholesterol levels.  Your activity level.  Other health conditions you have, such as heart or kidney disease.  How do carbohydrates affect me? Carbohydrates affect your blood glucose level more than any other type of food. Eating carbohydrates naturally increases the amount of glucose in your blood. Carbohydrate counting is a method for keeping track of how many carbohydrates you eat. Counting carbohydrates is important to keep your blood glucose at a healthy level, especially if you use insulin or take certain oral diabetes medicines. It is important to know how many carbohydrates you can safely have in each meal. This is different for every person. Your dietitian can help you calculate how many carbohydrates you should have at each meal and for snack. Foods that contain carbohydrates  include:  Bread, cereal, rice, pasta, and crackers.  Potatoes and corn.  Peas, beans, and lentils.  Milk and yogurt.  Fruit and juice.  Desserts, such as cakes, cookies, ice cream, and candy.  How does alcohol affect me? Alcohol can cause a sudden decrease in blood glucose (hypoglycemia), especially if you use insulin or take certain oral diabetes medicines. Hypoglycemia can be a life-threatening condition. Symptoms of hypoglycemia (sleepiness, dizziness, and confusion) are similar to symptoms of having too much alcohol. If your health care provider says that alcohol is safe for you, follow these guidelines:  Limit alcohol intake to no more than 1 drink per day for nonpregnant women and 2 drinks per day for men. One drink equals 12 oz of beer, 5 oz of wine, or 1 oz of hard liquor.  Do not drink on an empty stomach.  Keep yourself hydrated with water, diet soda, or unsweetened iced tea.  Keep in mind that regular soda, juice, and other mixers may contain a lot of sugar and must be counted as carbohydrates.  What are tips for following this plan? Reading food labels  Start by checking the serving size on the label. The amount of calories, carbohydrates, fats, and other nutrients listed on the label are based on one serving of the food. Many foods contain more than one serving per package.  Check the total grams (g) of carbohydrates in one serving. You can calculate the number of servings of carbohydrates in one serving by dividing the total carbohydrates by 15. For example, if a food has  30 g of total carbohydrates, it would be equal to 2 servings of carbohydrates.  Check the number of grams (g) of saturated and trans fats in one serving. Choose foods that have low or no amount of these fats.  Check the number of milligrams (mg) of sodium in one serving. Most people should limit total sodium intake to less than 2,300 mg per day.  Always check the nutrition information of foods  labeled as "low-fat" or "nonfat". These foods may be higher in added sugar or refined carbohydrates and should be avoided.  Talk to your dietitian to identify your daily goals for nutrients listed on the label. Shopping  Avoid buying canned, premade, or processed foods. These foods tend to be high in fat, sodium, and added sugar.  Shop around the outside edge of the grocery store. This includes fresh fruits and vegetables, bulk grains, fresh meats, and fresh dairy. Cooking  Use low-heat cooking methods, such as baking, instead of high-heat cooking methods like deep frying.  Cook using healthy oils, such as olive, canola, or sunflower oil.  Avoid cooking with butter, cream, or high-fat meats. Meal planning  Eat meals and snacks regularly, preferably at the same times every day. Avoid going long periods of time without eating.  Eat foods high in fiber, such as fresh fruits, vegetables, beans, and whole grains. Talk to your dietitian about how many servings of carbohydrates you can eat at each meal.  Eat 4-6 ounces of lean protein each day, such as lean meat, chicken, fish, eggs, or tofu. 1 ounce is equal to 1 ounce of meat, chicken, or fish, 1 egg, or 1/4 cup of tofu.  Eat some foods each day that contain healthy fats, such as avocado, nuts, seeds, and fish. Lifestyle   Check your blood glucose regularly.  Exercise at least 30 minutes 5 or more days each week, or as told by your health care provider.  Take medicines as told by your health care provider.  Do not use any products that contain nicotine or tobacco, such as cigarettes and e-cigarettes. If you need help quitting, ask your health care provider.  Work with a Social worker or diabetes educator to identify strategies to manage stress and any emotional and social challenges. What are some questions to ask my health care provider?  Do I need to meet with a diabetes educator?  Do I need to meet with a dietitian?  What number  can I call if I have questions?  When are the best times to check my blood glucose? Where to find more information:  American Diabetes Association: diabetes.org/food-and-fitness/food  Academy of Nutrition and Dietetics: PokerClues.dk  [employer] of Diabetes and Digestive and Kidney Diseases (NIH): ContactWire.be Summary  A healthy meal plan will help you control your blood glucose and maintain a healthy lifestyle.  Working with a diet and nutrition specialist (dietitian) can help you make a meal plan that is best for you.  Keep in mind that carbohydrates and alcohol have immediate effects on your blood glucose levels. It is important to count carbohydrates and to use alcohol carefully. This information is not intended to replace advice given to you by your health care provider. Make sure you discuss any questions you have with your health care provider. Document Released: 04/23/2005 Document Revised: 08/31/2016 Document Reviewed: 08/31/2016 Elsevier Interactive Patient Education  [employer].

## 2017-11-09 NOTE — Progress Notes (Signed)
Subjective  CC:  Chief Complaint  Patient presents with  . Establish Care    Patient transfer from [place], Has eye appt next week, due for vaccines and foot exam    HPI: Robin Morgan is a 62 y.o. female who presents to the office today for follow up of diabetes and problems listed above in the chief complaint.   Diabetes follow up: Her diabetic control is reported as Unchanged.  She is a former patient of mine at my last office.  Last diabetic follow-up was October 04, 2017.  A1c at that time was controlled at 6.7.  Glipizide was stopped due to nocturnal hypoglycemia and Lantus was changed to the morning.  Fastings are now controlled around 100.  She did have hyperglycemia after steroid injection for her wrist problem.  She is eating well.  Avoid sugars and carbohydrates. She denies exertional CP or SOB or symptomatic hypoglycemia. She denies foot sores or paresthesias, she is on high-dose gabapentin for her neuropathy.  Her eye exam is due next month.  She does not have retinopathy and she sees Dr. Herbert Deaner.  Her recent microalbuminuria testing was negative.  Her lipid levels were checked in November and are well controlled with an LDL in the 80s on a statin.  Hypertension: No chest pain or shortness of breath.  Has been well controlled.  Recent renal function and electrolytes were normal.  Hand pain: Seeing hand surgeon for unresolved carpal tunnel syndrome, status post surgery, tenosynovitis of right wrist for surgery soon.  Failed multiple steroid injections.   Fibromyalgia and chronic pain syndrome on chronic narcotics managed by pain management, physical medicine and rehabilitation.  She feels that this is well controlled.  Health maintenance: Last physical was in November.  I reviewed that note and lab work.  Mammogram is due and will be scheduled for next month.  Reviewed her recent bone density testing follow-up for osteopenia.  Immunization History  Administered Date(s)  Administered  . Influenza Split 06/04/2009, 05/02/2012  . Influenza, Quadrivalent, Recombinant, Inj, Pf 05/12/2016, 07/07/2017  . Influenza, Seasonal, Injecte, Preservative Fre 06/06/2014, 04/24/2015  . Influenza-Unspecified 09/03/2011, 04/19/2013  . Pneumococcal Polysaccharide-23 08/29/2009  . Tdap 05/11/2012    Diabetes Related Lab Review: No results found for: HGBA1C No results found for: [facility] Lab Results  Component Value Date   CREATININE 0.97 09/22/2016   BUN 18 09/22/2016   NA 140 09/22/2016   K 4.4 09/22/2016   CL 99 09/22/2016   CO2 24 09/22/2016   I have reviewed the PMH, Fam and Soc history. Patient Active Problem List   Diagnosis Date Noted  . Diabetic peripheral neuropathy associated with type 2 diabetes mellitus ([place]) 01/06/2017    Priority: High  . OSA (obstructive sleep apnea) 03/10/2016    Priority: High  . Controlled diabetes mellitus with diabetic neuropathy, with long-term current use of insulin ([place]) 10/11/2014    Priority: High  . HTN (hypertension) 10/11/2014    Priority: High  . Hyperlipidemia 10/11/2014    Priority: High  . DDD (degenerative disc disease), lumbar 01/15/2014    Priority: High  . Hypothyroidism, acquired 06/12/2013    Priority: High  . Cervical post-laminectomy syndrome 05/10/2013    Priority: High  . Chronic pain associated with significant psychosocial dysfunction 01/03/2010    Priority: High  . Esophageal stricture     Priority: Medium  . Tenosynovitis, wrist 10/07/2017    Priority: Medium  . Trigger thumb of left hand 10/07/2017  Priority: Medium  . Laryngopharyngeal reflux (LPR) 08/26/2017    Priority: Medium  . Dysphagia 08/26/2017    Priority: Medium  . Left carpal tunnel syndrome 03/08/2017    Priority: Medium  . Fibromyalgia 02/12/2016    Priority: Medium  . Osteopenia 12/01/2012    Priority: Medium  . Chronic diarrhea 07/04/2012    Priority: Medium    Social History: Patient  reports  that she has never smoked. She has never used smokeless tobacco. She reports that she does not drink alcohol or use drugs.  Review of Systems: Ophthalmic: negative for eye pain, loss of vision or double vision Cardiovascular: negative for chest pain Respiratory: negative for SOB or persistent cough Gastrointestinal: negative for abdominal pain Genitourinary: negative for dysuria or gross hematuria MSK: negative for foot lesions Neurologic: negative for weakness or gait disturbance  Objective  Vitals: BP 126/76   Pulse 96   Temp 98.3 F (36.8 C) (Oral)   Resp 15   Ht 5' 1.25" (1.556 m)   Wt 149 lb 9.6 oz (67.9 kg)   SpO2 94%   BMI 28.04 kg/m  General: well appearing, no acute distress  Psych:  Alert and oriented, normal mood and affect HEENT:  Normocephalic, atraumatic, moist mucous membranes, supple neck  Cardiovascular:  Nl S1 and S2, RRR without murmur, gallop or rub. no edema Respiratory:  Good breath sounds bilaterally, CTAB with normal effort, no rales Skin:  Warm, no rashes Neurologic:   Mental status is normal. normal gait Foot exam: no erythema, pallor, or cyanosis visible nl proprioception and sensation to monofilament testing bilaterally, +2 distal pulses bilaterally    Assessment  1. Controlled type 2 diabetes mellitus with diabetic neuropathy, with long-term current use of insulin ([place])   2. Essential hypertension   3. Mixed hyperlipidemia   4. Chronic pain associated with significant psychosocial dysfunction   5. DDD (degenerative disc disease), lumbar   6. Fibromyalgia   7. Laryngopharyngeal reflux (LPR)   8. Chronic narcotic use   9. Esophageal stricture   10. Osteopenia, unspecified location      Plan   Diabetes is currently well controlled. Continue current meds. Eye exam due next month. Labs are current and stable. Continue bp meds and lipid meds. All are at goal.   chornic pain per Dr. Naaman Plummer.  Osteopenia - ca and vit d. Recheck 2 years.    Diabetic education: ongoing education regarding chronic disease management for diabetes was given today. We continue to reinforce the ABC's of diabetic management: A1c (<7 or 8 dependent upon patient), tight blood pressure control, and cholesterol management with goal LDL < 100 minimally. We discuss diet strategies, exercise recommendations, medication options and possible side effects. At each visit, we review recommended immunizations and preventive care recommendations for diabetics and stress that good diabetic control can prevent other problems. See below for this patient's data.  Follow up: Return for follow up of diabetes and hypertension..   Commons side effects, risks, benefits, and alternatives for medications and treatment plan prescribed today were discussed, and the patient expressed understanding of the given instructions. Patient is instructed to call or message via MyChart if he/she has any questions or concerns regarding our treatment plan. No barriers to understanding were identified. We discussed Red Flag symptoms and signs in detail. Patient expressed understanding regarding what to do in case of urgent or emergency type symptoms.   Medication list was reconciled, printed and provided to the patient in AVS. Patient instructions and summary information  was reviewed with the patient as documented in the AVS. This note was prepared with assistance of Dragon voice recognition software. Occasional wrong-word or sound-a-like substitutions may have occurred due to the inherent limitations of voice recognition software  No orders of the defined types were placed in this encounter.  No orders of the defined types were placed in this encounter.

## 2017-11-19 ENCOUNTER — Telehealth: Payer: Self-pay

## 2017-11-19 NOTE — Telephone Encounter (Signed)
Pt called stating she is having Hand surgery on the 17th and she wants to know if she should get pain medication from Dr. Apolonio Schneiders or take what she is prescribed by this office.

## 2017-11-19 NOTE — Telephone Encounter (Signed)
She should take what's prescribed by [place], and if that's insufficient, she should give [place] a call.

## 2017-11-21 ENCOUNTER — Other Ambulatory Visit: Payer: Self-pay | Admitting: Registered Nurse

## 2017-11-22 NOTE — Telephone Encounter (Signed)
Notified Robin Morgan.  She asked about taking extra tylenol for migraine.  I advised her not to go over 3000 mg in 24 hour period and to remember she gets about 1000mg  with her regular hydrocodone tablets in a day.

## 2017-11-29 ENCOUNTER — Other Ambulatory Visit: Payer: Self-pay | Admitting: Registered Nurse

## 2017-12-02 ENCOUNTER — Telehealth: Payer: Self-pay

## 2017-12-02 NOTE — Telephone Encounter (Signed)
Recieved a voicemail today from this patient stating her pharmacy was only giving her a 7 day supply of medication for her hydrocodone meds.  Called pharmacy and they stated that the patient is needing a prior auth for this medicine  Prior auth started 12-02-17

## 2017-12-03 NOTE — Telephone Encounter (Signed)
Checked status of prior authorization today 12-03-2017, PA has been approved, notified pharmacy and patient.

## 2017-12-06 ENCOUNTER — Ambulatory Visit: Payer: Medicaid Other | Admitting: Registered Nurse

## 2017-12-07 ENCOUNTER — Encounter: Payer: Self-pay | Admitting: Registered Nurse

## 2017-12-07 ENCOUNTER — Encounter: Payer: Medicaid Other | Attending: Physical Medicine and Rehabilitation | Admitting: Registered Nurse

## 2017-12-07 VITALS — BP 111/71 | HR 98 | Resp 14 | Ht 62.0 in | Wt 149.0 lb

## 2017-12-07 DIAGNOSIS — M542 Cervicalgia: Secondary | ICD-10-CM | POA: Diagnosis not present

## 2017-12-07 DIAGNOSIS — F3289 Other specified depressive episodes: Secondary | ICD-10-CM

## 2017-12-07 DIAGNOSIS — G894 Chronic pain syndrome: Secondary | ICD-10-CM

## 2017-12-07 DIAGNOSIS — Z79899 Other long term (current) drug therapy: Secondary | ICD-10-CM

## 2017-12-07 DIAGNOSIS — Z5181 Encounter for therapeutic drug level monitoring: Secondary | ICD-10-CM

## 2017-12-07 DIAGNOSIS — M7918 Myalgia, other site: Secondary | ICD-10-CM | POA: Diagnosis not present

## 2017-12-07 DIAGNOSIS — M609 Myositis, unspecified: Secondary | ICD-10-CM | POA: Diagnosis not present

## 2017-12-07 DIAGNOSIS — M961 Postlaminectomy syndrome, not elsewhere classified: Secondary | ICD-10-CM | POA: Diagnosis not present

## 2017-12-07 DIAGNOSIS — M797 Fibromyalgia: Secondary | ICD-10-CM | POA: Diagnosis not present

## 2017-12-07 DIAGNOSIS — G5602 Carpal tunnel syndrome, left upper limb: Secondary | ICD-10-CM

## 2017-12-07 DIAGNOSIS — G8929 Other chronic pain: Secondary | ICD-10-CM

## 2017-12-07 DIAGNOSIS — M546 Pain in thoracic spine: Secondary | ICD-10-CM | POA: Diagnosis not present

## 2017-12-07 NOTE — Progress Notes (Signed)
Subjective:    Patient ID: Robin Morgan, female    DOB: [birth year], 63 y.o.   MRN: [id number]  HPI: [name] is a 62 year old female who is here for follow up appointment for chronic pain and medication refill. She states her pain is located in her neck, left wrist and mid-back. She rates her pain 9. Hercurrent exercise regime is walking and performing stretching exercises .   Robin Morgan reports she had surgery on her Left wrist by Dr. Apolonio Schneiders at surgical center. Care-everywhere reviewed no notes noted. Robin Morgan can't recall the surgery.   Robin Morgan Morphine Equivalent is 23.08 MME.   Last Oral Swab was Performed on 08/30/2017, it was consistent.   Pain Inventory Average Pain 9 Pain Right Now 9 My pain is constant and sharp  In the last 24 hours, has pain interfered with the following? General activity 9 Relation with others 7 Enjoyment of life 7 What TIME of day is your pain at its worst? morning, night Sleep (in general) Poor  Pain is worse with: sitting and standing Pain improves with: medication Relief from Meds: 8  Mobility walk without assistance ability to climb steps?  yes do you drive?  yes  Function disabled: date disabled .  Neuro/Psych bladder control problems spasms  Prior Studies Any changes since last visit?  no  Physicians involved in your care Any changes since last visit?  no   Family History  Problem Relation Age of Onset  . Alzheimer's disease Mother   . Diabetes Father   . Heart disease Maternal Grandmother   . Breast cancer Paternal Grandmother 35   Social History   Socioeconomic History  . Marital status: Divorced    Spouse name: Not on file  . Number of children: 1  . Years of education: Not on file  . Highest education level: Not on file  Occupational History  . Occupation: disabled  Social Needs  . Financial resource strain: Not on file  . Food insecurity:    Worry: Not on file    Inability: Not on file  .  Transportation needs:    Medical: Not on file    Non-medical: Not on file  Tobacco Use  . Smoking status: Never Smoker  . Smokeless tobacco: Never Used  Substance and Sexual Activity  . Alcohol use: No  . Drug use: No  . Sexual activity: Not Currently  Lifestyle  . Physical activity:    Days per week: Not on file    Minutes per session: Not on file  . Stress: Not on file  Relationships  . Social connections:    Talks on phone: Not on file    Gets together: Not on file    Attends religious service: Not on file    Active member of club or organization: Not on file    Attends meetings of clubs or organizations: Not on file    Relationship status: Not on file  Other Topics Concern  . Not on file  Social History Narrative  . Not on file   Past Surgical History:  Procedure Laterality Date  . ABDOMINAL HYSTERECTOMY     1986  . APPENDECTOMY     1983  . BREAST EXCISIONAL BIOPSY Right 1990's  . BREAST SURGERY     rt breast cyst done in the 90's  . CHOLECYSTECTOMY    . GALLBLADDER SURGERY     1983  . ROTATOR CUFF REPAIR  right  . SPINE SURGERY    . TONSILLECTOMY     1973   Past Medical History:  Diagnosis Date  . Calcific tendonitis   . Cervical spondylosis without myelopathy   . Depression   . Diabetes mellitus   . Esophageal stricture   . Fibromyalgia   . GERD (gastroesophageal reflux disease)   . Hiatal hernia   . Hyperlipidemia   . Hypertension    BP 111/71 (BP Location: Right Arm, Patient Position: Sitting, Cuff Size: Normal)   Pulse 98   Resp 14   Ht 5\' 2"  (1.575 m)   Wt 149 lb (67.6 kg)   SpO2 90%   BMI 27.25 kg/m   Opioid Risk Score:   Fall Risk Score:  `1  Depression screen PHQ 2/9  Depression screen [facility] 2/9 11/09/2017 10/27/2017 07/29/2017 06/03/2017 04/08/2016 10/08/2015 11/21/2014  Decreased Interest 0 3 3 1 2 2 2   Down, Depressed, Hopeless 0 3 3 1  0 0 0  PHQ - 2 Score 0 6 6 2 2 2 2   Altered sleeping 0 - - - - - 2  Tired, decreased energy 0  - - - - - 2  Change in appetite 0 - - - - - 1  Feeling bad or failure about yourself  0 - - - - - 0  Trouble concentrating 0 - - - - - 1  Moving slowly or fidgety/restless 0 - - - - - 0  Suicidal thoughts 0 - - - - - 0  PHQ-9 Score 0 - - - - - 8     Review of Systems  Constitutional: Positive for unexpected weight change.  HENT: Negative.   Eyes: Negative.   Respiratory: Negative.   Cardiovascular: Negative.   Gastrointestinal: Negative.   Endocrine:       High/low blood sugar  Genitourinary: Negative.   Musculoskeletal: Positive for arthralgias.       Spasms   Neurological: Negative.   Hematological: Negative.   Psychiatric/Behavioral: Negative.        Objective:   Physical Exam  Constitutional: She is oriented to person, place, and time. She appears well-developed and well-nourished.  HENT:  Head: Normocephalic and atraumatic.  Neck: Normal range of motion. Neck supple.  Cardiovascular: Normal rate and normal heart sounds.  Musculoskeletal:  Normal Muscle Bulk and Muscle Testing Reveals: Upper Extremities: Full ROM and Muscle Strength on Right 4/5 and Left 3/5 Right Wrist Dressing intact Thoracic Paraspinal Tenderness: T-7-T-9 Lower Extremities: Full ROM and Muscle Strength 5/5 Arises from chair with ease Narrow Based gait   Neurological: She is alert and oriented to person, place, and time.  Skin: Skin is warm and dry.  Nursing note and vitals reviewed.         Assessment & Plan:  1. Fibromyalgia with myofascial pain: Continue exercise and heat therapy. 12/03/2017. Continue current medication regimen with Flexeril. 2. Rotator cuff syndrome on the right: Continue with stretching exercises and heat therapy. 12/03/2017 3. Cervicalgia. Post-laminectomy syndrome, facet arthropathy: Cervical Radiculopathy: Continue current medication regimen with Gabapentin. 12/03/2017  S/ P  EMG with Dr. Naaman Plummer on 03/08/2017: Diagnosed with Moderate to Severe CTS of Left  wrist: 12/03/2017 4. Depression: Continue current medication regimen with Cymbalta. PCP increase dose to 90 mg daily. 12/03/2017 5. Mid/low back pain/ Lumbar Spondylosis: Continue Current Medication and stretching and heat therapy. 12/03/2017 Refilled: Hydrocodone 7.5/325 mg one tablet every 8 hours as needed for moderate pain #80. 10/27/2017 6. Lumbar Radiculopathy: No complaints today. Continue  current medication regimen with Gabapentin. 12/03/2017 7OA of right hand:  Continue using Voltaren gel/ May substitute with Voltaren Tablet, she realizes she can't use both and verbalizes understanding. Continue with  heat therapy. 12/03/2017 8. Migraines: No complaints today. Continue to Monitor 12/03/2017 9. Left CTS:S/P EMG on 03/08/2017: S/P Carpal Tunnel Release on 06/14/2017 via . Dr. Apolonio Schneiders. 12/03/2017  20 minutes of face to face patient care time was spent during this visit. All questions were encouraged and answered.   F/U in 75month

## 2017-12-12 ENCOUNTER — Other Ambulatory Visit: Payer: Self-pay | Admitting: Registered Nurse

## 2017-12-17 ENCOUNTER — Other Ambulatory Visit: Payer: Self-pay | Admitting: Family Medicine

## 2017-12-17 DIAGNOSIS — Z1231 Encounter for screening mammogram for malignant neoplasm of breast: Secondary | ICD-10-CM

## 2017-12-27 ENCOUNTER — Encounter: Payer: Medicaid Other | Attending: Physical Medicine and Rehabilitation | Admitting: Registered Nurse

## 2017-12-27 ENCOUNTER — Encounter: Payer: Self-pay | Admitting: Registered Nurse

## 2017-12-27 VITALS — BP 115/59 | HR 101 | Resp 14 | Ht 62.0 in | Wt 146.0 lb

## 2017-12-27 DIAGNOSIS — G5602 Carpal tunnel syndrome, left upper limb: Secondary | ICD-10-CM

## 2017-12-27 DIAGNOSIS — M609 Myositis, unspecified: Secondary | ICD-10-CM | POA: Insufficient documentation

## 2017-12-27 DIAGNOSIS — M546 Pain in thoracic spine: Secondary | ICD-10-CM | POA: Diagnosis not present

## 2017-12-27 DIAGNOSIS — G8929 Other chronic pain: Secondary | ICD-10-CM | POA: Diagnosis not present

## 2017-12-27 DIAGNOSIS — F3289 Other specified depressive episodes: Secondary | ICD-10-CM | POA: Diagnosis not present

## 2017-12-27 DIAGNOSIS — M961 Postlaminectomy syndrome, not elsewhere classified: Secondary | ICD-10-CM | POA: Diagnosis not present

## 2017-12-27 DIAGNOSIS — M797 Fibromyalgia: Secondary | ICD-10-CM | POA: Diagnosis not present

## 2017-12-27 DIAGNOSIS — G894 Chronic pain syndrome: Secondary | ICD-10-CM | POA: Diagnosis not present

## 2017-12-27 DIAGNOSIS — Z79899 Other long term (current) drug therapy: Secondary | ICD-10-CM

## 2017-12-27 DIAGNOSIS — M7918 Myalgia, other site: Secondary | ICD-10-CM | POA: Diagnosis not present

## 2017-12-27 DIAGNOSIS — Z5181 Encounter for therapeutic drug level monitoring: Secondary | ICD-10-CM | POA: Diagnosis not present

## 2017-12-27 MED ORDER — HYDROCODONE-ACETAMINOPHEN 7.5-325 MG PO TABS: 1.0000 | ORAL_TABLET | Freq: Three times a day (TID) | ORAL | 0 refills | Status: DC | PRN

## 2017-12-27 NOTE — Progress Notes (Signed)
Subjective:    Patient ID: Robin Morgan, female    DOB: [birth year], 62 y.o.   MRN: [id number]  HPI: Robin Morgan is a 62 year old female who returns for follow up appointment for chronic pain and medication refill. She states her pain is located in her upper-back. She rates her pain 7. Her current exercise regime is walking and performing stretching exercises.   Robin Morgan Morphine Equivalent is 19.23 MME.  Last Oral Swab was Performed on 08/30/2017, it was consistent.   Pain Inventory Average Pain 8 Pain Right Now 7 My pain is constant, burning, stabbing and aching  In the last 24 hours, has pain interfered with the following? General activity 9 Relation with others 9 Enjoyment of life 9 What TIME of day is your pain at its worst? morning, evening, night  Sleep (in general) Poor  Pain is worse with: sitting and standing Pain improves with: medication Relief from Meds: 8  Mobility walk without assistance ability to climb steps?  yes do you drive?  yes  Function disabled: date disabled .  Neuro/Psych bladder control problems bowel control problems weakness spasms  Prior Studies Any changes since last visit?  no  Physicians involved in your care Any changes since last visit?  no   Family History  Problem Relation Age of Onset  . Alzheimer's disease Mother   . Diabetes Father   . Heart disease Maternal Grandmother   . Breast cancer Paternal Grandmother 35   Social History   Socioeconomic History  . Marital status: Divorced    Spouse name: Not on file  . Number of children: 1  . Years of education: Not on file  . Highest education level: Not on file  Occupational History  . Occupation: disabled  Social Needs  . Financial resource strain: Not on file  . Food insecurity:    Worry: Not on file    Inability: Not on file  . Transportation needs:    Medical: Not on file    Non-medical: Not on file  Tobacco Use  . Smoking status: Never Smoker   . Smokeless tobacco: Never Used  Substance and Sexual Activity  . Alcohol use: No  . Drug use: No  . Sexual activity: Not Currently  Lifestyle  . Physical activity:    Days per week: Not on file    Minutes per session: Not on file  . Stress: Not on file  Relationships  . Social connections:    Talks on phone: Not on file    Gets together: Not on file    Attends religious service: Not on file    Active member of club or organization: Not on file    Attends meetings of clubs or organizations: Not on file    Relationship status: Not on file  Other Topics Concern  . Not on file  Social History Narrative  . Not on file   Past Surgical History:  Procedure Laterality Date  . ABDOMINAL HYSTERECTOMY     1986  . APPENDECTOMY     1983  . BREAST EXCISIONAL BIOPSY Right 1990's  . BREAST SURGERY     rt breast cyst done in the 90's  . CHOLECYSTECTOMY    . GALLBLADDER SURGERY     1983  . ROTATOR CUFF REPAIR     right  . SPINE SURGERY    . TONSILLECTOMY     1973   Past Medical History:  Diagnosis Date  . Calcific tendonitis   .  Cervical spondylosis without myelopathy   . Depression   . Diabetes mellitus   . Esophageal stricture   . Fibromyalgia   . GERD (gastroesophageal reflux disease)   . Hiatal hernia   . Hyperlipidemia   . Hypertension    BP (!) 115/59 (BP Location: Left Arm, Patient Position: Sitting, Cuff Size: Normal)   Pulse (!) 101   Resp 14   Ht 5\' 2"  (1.575 m)   Wt 146 lb (66.2 kg)   SpO2 97%   BMI 26.70 kg/m   Opioid Risk Score:   Fall Risk Score:  `1  Depression screen PHQ 2/9  Depression screen [facility] 2/9 12/07/2017 11/09/2017 10/27/2017 07/29/2017 06/03/2017 04/08/2016 10/08/2015  Decreased Interest 0 0 3 3 1 2 2   Down, Depressed, Hopeless 0 0 3 3 1  0 0  PHQ - 2 Score 0 0 6 6 2 2 2   Altered sleeping - 0 - - - - -  Tired, decreased energy - 0 - - - - -  Change in appetite - 0 - - - - -  Feeling bad or failure about yourself  - 0 - - - - -  Trouble  concentrating - 0 - - - - -  Moving slowly or fidgety/restless - 0 - - - - -  Suicidal thoughts - 0 - - - - -  PHQ-9 Score - 0 - - - - -    Review of Systems  Constitutional: Positive for unexpected weight change.  Eyes: Negative.   Respiratory: Positive for apnea.   Cardiovascular: Negative.   Gastrointestinal: Positive for diarrhea and nausea.  Endocrine: Negative.   Genitourinary: Positive for difficulty urinating.       High/low blood sugar  Musculoskeletal: Positive for arthralgias, neck pain and neck stiffness.       Spasms   Allergic/Immunologic: Negative.   Neurological: Positive for weakness.  Hematological: Negative.   Psychiatric/Behavioral: Negative.   All other systems reviewed and are negative.      Objective:   Physical Exam  Constitutional: She is oriented to person, place, and time. She appears well-developed and well-nourished.  HENT:  Head: Normocephalic and atraumatic.  Neck: Normal range of motion. Neck supple.  Cardiovascular: Normal rate.  Pulmonary/Chest: Effort normal.  Musculoskeletal:  Normal Muscle Bulk and Muscle Testing Reveals:  Upper Extremities: Full ROM and Muscle Strength 5/5 Thoracic Paraspinal Tenderness: T-7-T-9 Lower Extremities: Full ROM and Muscle Strength 5/5 Arises from chair with ease Narrow Based Gait   Neurological: She is alert and oriented to person, place, and time.  Skin: Skin is warm and dry.  Psychiatric: She has a normal mood and affect.  Nursing note and vitals reviewed.         Assessment & Plan:  1. Fibromyalgia with myofascial pain: Continue exercise and heat therapy. 12/27/2017. Continue current medication regimen with Flexeril. 2. Rotator cuff syndrome on the right: Continue with stretching exercises and heat therapy. 12/27/2017 3. Cervicalgia. Post-laminectomy syndrome, facet arthropathy: Cervical Radiculopathy: Continue current medication regimen with Gabapentin. 12/27/2017 S/ P EMG with Dr. Naaman Plummer  on 03/08/2017: Diagnosed with Moderate to Severe CTS of Left wrist:12/27/2017 4. Depression: Continue current medication regimen with Cymbalta. PCP increase dose to 90 mg daily. 12/27/2017 5. Mid/low back pain/ Lumbar Spondylosis: Continue Current Medication and stretching and heat therapy. 12/27/2017 Refilled: Hydrocodone 7.5/325 mg one tablet every 8 hours as needed for moderate pain #80. 12/27/2017 6. Lumbar Radiculopathy:No complaints today.Continue current medication regimen with Gabapentin. 12/27/2017 7OA of right hand: Continue  using Voltaren gel/ May substitute with Voltaren Tablet, she realizes she can't use both and verbalizes understanding. Continue with  heat therapy. 12/27/2017 8. Migraines: No complaints today. Continue to Monitor 12/27/2017 9. Left CTS:S/P EMG on 03/08/2017: S/P Carpal Tunnel Release on 06/14/2017 via . Dr. Apolonio Schneiders. 12/27/2017 10. Muscle Spasm: Continue Flexeril. Continue to Monitor. 12/27/2017  20 minutes of face to face patient care time was spent during this visit. All questions were encouraged and answered.   F/U in 7month

## 2017-12-31 ENCOUNTER — Encounter: Payer: Self-pay | Admitting: Family Medicine

## 2017-12-31 ENCOUNTER — Ambulatory Visit: Payer: Medicaid Other | Admitting: Family Medicine

## 2017-12-31 ENCOUNTER — Other Ambulatory Visit: Payer: Self-pay

## 2017-12-31 VITALS — BP 110/64 | HR 95 | Temp 98.0°F | Ht 62.0 in | Wt 145.6 lb

## 2017-12-31 DIAGNOSIS — K529 Noninfective gastroenteritis and colitis, unspecified: Secondary | ICD-10-CM | POA: Diagnosis not present

## 2017-12-31 LAB — COMPREHENSIVE METABOLIC PANEL
ALT: 20 U/L (ref 0–35)
AST: 21 U/L (ref 0–37)
Albumin: 4.6 g/dL (ref 3.5–5.2)
Alkaline Phosphatase: 59 U/L (ref 39–117)
BUN: 13 mg/dL (ref 6–23)
CALCIUM: 9.9 mg/dL (ref 8.4–10.5)
CO2: 25 meq/L (ref 19–32)
Chloride: 105 mEq/L (ref 96–112)
Creatinine, Ser: 0.83 mg/dL (ref 0.40–1.20)
GFR: 73.49 mL/min (ref >60.00)
GLUCOSE: 121 mg/dL — AB (ref 70–99)
Potassium: 3.7 mEq/L (ref 3.5–5.1)
SODIUM: 141 meq/L (ref 135–145)
Total Bilirubin: 0.4 mg/dL (ref 0.2–1.2)
Total Protein: 7 g/dL (ref 6.0–8.3)

## 2017-12-31 LAB — CBC WITH DIFFERENTIAL/PLATELET
Basophils Absolute: 0 10*3/uL (ref 0.0–0.1)
Basophils Relative: 0.3 % (ref 0.0–3.0)
Eosinophils Absolute: 0.2 10*3/uL (ref 0.0–0.7)
Eosinophils Relative: 1.9 % (ref 0.0–5.0)
HCT: 40.6 % (ref 36.0–46.0)
Hemoglobin: 13 g/dL (ref 12.0–15.0)
LYMPHS PCT: 32.4 % (ref 12.0–46.0)
Lymphs Abs: 3.1 10*3/uL (ref 0.7–4.0)
MCHC: 32.1 g/dL (ref 30.0–36.0)
MCV: 84.4 fl (ref 78.0–100.0)
Monocytes Absolute: 0.7 10*3/uL (ref 0.1–1.0)
Monocytes Relative: 7.1 % (ref 3.0–12.0)
NEUTROS PCT: 58.3 % (ref 43.0–77.0)
Neutro Abs: 5.6 10*3/uL (ref 1.4–7.7)
PLATELETS: 383 10*3/uL (ref 150.0–400.0)
RBC: 4.81 Mil/uL (ref 3.87–5.11)
RDW: 14.9 % (ref 11.5–15.5)
WBC: 9.6 10*3/uL (ref 4.0–10.5)

## 2017-12-31 LAB — LIPASE: Lipase: 41 U/L (ref 11.0–59.0)

## 2017-12-31 MED ORDER — COLESTIPOL HCL 5 G PO GRAN: 5.0000 g | GRANULES | Freq: Two times a day (BID) | ORAL | 1 refills | Status: DC

## 2017-12-31 NOTE — Progress Notes (Signed)
Subjective  CC:  Chief Complaint  Patient presents with  . Diarrhea    x 2 weeks, seems to happen after she takes her mediciations   . Nausea    HPI: Robin Morgan is a 62 y.o. female who presents to the office today to address the problems listed above in the chief complaint.  Has h/o chronic diarrhea w/ negative GI eval treated with lomotil. Last flare about a year ago. Reports 2 week h/o n w/o vomiting and loose stools 4-5x/day for about two weeks. Thought related to gabapentin (for unclear reasons) so weaned but no change in sxs. On metformin for years. No other med changes. On chronic narcotics. Some nausea and heartburn: on nexium and prn zantac. H/o esophageal stricture. No f/c/s. No melena. No mucoid stool. Took lomotil today and only has gone to [facility] once so far.   Assessment  1. Chronic diarrhea      Plan   diarrhea:  Likely flare: rec holding metformin, using lomotil as needed, restarting gabapenin and then adding colestid if not improving. IF sxs persist, call GI for eval again. May need upper GI eval again due to heartburn sxs and some dysphagia.  Check labs.  Follow up: Return for as scheduled.   Orders Placed This Encounter  Procedures  . CBC with Differential/Platelet  . Comprehensive metabolic panel  . Lipase   Meds ordered this encounter  Medications  . colestipol (COLESTID) 5 g granules    Sig: Take 5 g by mouth 2 (two) times daily.    Dispense:  500 g    Refill:  1      I reviewed the patients updated PMH, FH, and SocHx.    Patient Active Problem List   Diagnosis Date Noted  . Diabetic peripheral neuropathy associated with type 2 diabetes mellitus ([place]) 01/06/2017    Priority: High  . OSA (obstructive sleep apnea) 03/10/2016    Priority: High  . Controlled diabetes mellitus with diabetic neuropathy, with long-term current use of insulin ([place]) 10/11/2014    Priority: High  . HTN (hypertension) 10/11/2014    Priority: High  . Hyperlipidemia  10/11/2014    Priority: High  . DDD (degenerative disc disease), lumbar 01/15/2014    Priority: High  . Hypothyroidism, acquired 06/12/2013    Priority: High  . Cervical post-laminectomy syndrome 05/10/2013    Priority: High  . Chronic pain associated with significant psychosocial dysfunction 01/03/2010    Priority: High  . Esophageal stricture     Priority: Medium  . Tenosynovitis, wrist 10/07/2017    Priority: Medium  . Trigger thumb of left hand 10/07/2017    Priority: Medium  . Laryngopharyngeal reflux (LPR) 08/26/2017    Priority: Medium  . Dysphagia 08/26/2017    Priority: Medium  . Left carpal tunnel syndrome 03/08/2017    Priority: Medium  . Fibromyalgia 02/12/2016    Priority: Medium  . Osteopenia 12/01/2012    Priority: Medium  . Chronic diarrhea 07/04/2012    Priority: Medium   Current Meds  Medication Sig  . atorvastatin (LIPITOR) 40 MG tablet Take 40 mg by mouth daily.  Marland Kitchen b complex vitamins tablet Take 1 tablet by mouth daily.  . Calcium Carbonate-Vitamin D (CALCIUM-VITAMIN D) 500-200 MG-UNIT per tablet Take 1 tablet by mouth 2 (two) times daily with a meal.  . co-enzyme Q-10 30 MG capsule Take 30 mg by mouth 3 (three) times daily.  . cyclobenzaprine (FLEXERIL) 10 MG tablet TAKE 1 TABLET (10 MG TOTAL)  BY MOUTH EVERY 8 (EIGHT) HOURS AS NEEDED.  Marland Kitchen diclofenac sodium (VOLTAREN) 1 % GEL Apply 2 g topically 4 (four) times daily.  . diphenoxylate-atropine (LOMOTIL) 2.5-0.025 MG tablet Take by mouth 4 (four) times daily as needed for diarrhea or loose stools.  . DULoxetine (CYMBALTA) 30 MG capsule take one 30 mg capsule with 60 mg capsule for total of 90 mg daily.  Marland Kitchen esomeprazole (NEXIUM) 40 MG capsule Take 40 mg by mouth 2 (two) times daily before a meal. Morning and evening  . fenofibrate 160 MG tablet Take 160 mg by mouth daily.  Marland Kitchen gabapentin (NEURONTIN) 300 MG capsule Take 1 capsule (300 mg total) by mouth as directed. Take 2 capsules in the morning, 2 capsules at  lunch and 3 at bedtime  . HYDROcodone-acetaminophen (NORCO) 7.5-325 MG tablet Take 1 tablet by mouth every 8 (eight) hours as needed for moderate pain.  Marland Kitchen insulin glargine (LANTUS) 100 UNIT/ML injection Inject 60 Units into the skin daily.   Marland Kitchen levothyroxine (SYNTHROID, LEVOTHROID) 88 MCG tablet Take 88 mcg by mouth daily before breakfast.  . lisinopril (PRINIVIL,ZESTRIL) 10 MG tablet Take 10 mg by mouth daily.  . magnesium gluconate (MAGONATE) 500 MG tablet Take 500 mg by mouth daily.  . metFORMIN (GLUCOPHAGE) 1000 MG tablet Take 1 tablet by mouth 2 (two) times daily.  . Omega-3 Fatty Acids (FISH OIL) 1000 MG CAPS Take 2 capsules by mouth daily.  . ranitidine (ZANTAC) 300 MG tablet Take 300 mg by mouth at bedtime.  . triamcinolone cream (KENALOG) 0.1 % Apply 1 application topically 2 (two) times daily. As needed  . zolpidem (AMBIEN) 10 MG tablet Take 10 mg by mouth at bedtime as needed. for sleep  . [DISCONTINUED] diclofenac (VOLTAREN) 50 MG EC tablet Take 1 tablet (50 mg total) by mouth 2 (two) times daily with a meal.  . [DISCONTINUED] meloxicam (MOBIC) 7.5 MG tablet TAKE 1 TABLET BY MOUTH EVERY DAY    Allergies: Patient is allergic to compazine; prochlorperazine; and naproxen. Family History: Patient family history includes Alzheimer's disease in her mother; Breast cancer (age of onset: 69) in her paternal grandmother; Diabetes in her father; Heart disease in her maternal grandmother. Social History:  Patient  reports that she has never smoked. She has never used smokeless tobacco. She reports that she does not drink alcohol or use drugs.  Review of Systems: Constitutional: Negative for fever malaise or anorexia Cardiovascular: negative for chest pain Respiratory: negative for SOB or persistent cough Gastrointestinal: negative for abdominal pain  Objective  Vitals: BP 110/64   Pulse 95   Temp 98 F (36.7 C)   Ht 5\' 2"  (1.575 m)   Wt 145 lb 9.6 oz (66 kg)   BMI 26.63 kg/m    General: no acute distress , A&Ox3 HEENT: PEERL, conjunctiva normal, Oropharynx moist,neck is supple Cardiovascular:  RRR without murmur or gallop.  Respiratory:  Good breath sounds bilaterally, CTAB with normal respiratory effort Gastrointestinal: soft, flat abdomen, mildly hyperactive bowel sounds, no palpable masses, no hepatosplenomegaly, no appreciated hernias Skin:  Warm, no rashes     Commons side effects, risks, benefits, and alternatives for medications and treatment plan prescribed today were discussed, and the patient expressed understanding of the given instructions. Patient is instructed to call or message via MyChart if he/she has any questions or concerns regarding our treatment plan. No barriers to understanding were identified. We discussed Red Flag symptoms and signs in detail. Patient expressed understanding regarding what to do in case  of urgent or emergency type symptoms.   Medication list was reconciled, printed and provided to the patient in AVS. Patient instructions and summary information was reviewed with the patient as documented in the AVS. This note was prepared with assistance of Dragon voice recognition software. Occasional wrong-word or sound-a-like substitutions may have occurred due to the inherent limitations of voice recognition software

## 2017-12-31 NOTE — Patient Instructions (Addendum)
follow up with me as scheduled.  Hold Metformin for 1 week.   Use Lomotil as needed  Restart Gabapentin  Add Colestid if needed  Call Dr. Erlinda Hong office if you're not getting any better.

## 2018-01-06 ENCOUNTER — Other Ambulatory Visit: Payer: Self-pay | Admitting: Family Medicine

## 2018-01-06 NOTE — Telephone Encounter (Signed)
Copied from [place] [id number]. Topic: Quick Communication - Rx Refill/Question >> Jan 06, 2018  3:22 PM Cleaster Corin, [place] wrote: Medication: insulin glargine (LANTUS) 100 UNIT/ML injection [[id number]]  fenofibrate 160 MG tablet [[id number]]   Has the patient contacted their pharmacy? yes (Agent: If no, request that the patient contact the pharmacy for the refill.) (Agent: If yes, when and what did the pharmacy advise?)  Preferred Pharmacy (with phone number or street name): [id number] [place], [place] - [address] [address] Phone: [phone] Fax: [phone]  Pt. States that med. Was denied and she has already contacted the pharmacy with Dr. Jonni Sanger new location. Pt. Is completely out of fenofibrate 160 MG tablet and has been out for 2 weeks And only has one lancets left.  Agent: Please be advised that RX refills may take up to 3 business days. We ask that you follow-up with your pharmacy.

## 2018-01-07 ENCOUNTER — Other Ambulatory Visit: Payer: Self-pay | Admitting: Family Medicine

## 2018-01-07 MED ORDER — INSULIN GLARGINE 100 UNIT/ML ~~LOC~~ SOLN: 60.0000 [IU] | Freq: Every day | SUBCUTANEOUS | 1 refills | Status: DC

## 2018-01-07 NOTE — Telephone Encounter (Signed)
Refill appropriate.   Submitted to pharmacy.

## 2018-01-07 NOTE — Telephone Encounter (Signed)
Lantus  Refill, listed as historical med Fenofibrate refill, listed as historical med   Last OV: 12/31/17 PCP: Dr. Jonni Sanger [facility]    [address]

## 2018-01-11 ENCOUNTER — Ambulatory Visit
Admission: RE | Admit: 2018-01-11 | Discharge: 2018-01-11 | Disposition: A | Discharge status: Home / Self Care | Payer: Medicaid Other | Source: Ambulatory Visit | Attending: Family Medicine | Admitting: Family Medicine

## 2018-01-11 ENCOUNTER — Other Ambulatory Visit: Payer: Self-pay | Admitting: *Deleted

## 2018-01-11 DIAGNOSIS — Z1231 Encounter for screening mammogram for malignant neoplasm of breast: Secondary | ICD-10-CM

## 2018-01-11 MED ORDER — LANTUS SOLOSTAR 100 UNIT/ML ~~LOC~~ SOPN: 60.0000 [IU] | PEN_INJECTOR | Freq: Every day | SUBCUTANEOUS | 11 refills | Status: DC

## 2018-01-13 ENCOUNTER — Encounter: Payer: Self-pay | Admitting: Family Medicine

## 2018-01-13 DIAGNOSIS — K529 Noninfective gastroenteritis and colitis, unspecified: Secondary | ICD-10-CM

## 2018-01-14 NOTE — Telephone Encounter (Signed)
Please see mychart note. I ordered colestid at her last ov but she hasn't received it; can you please investigate.   And set up referral to [facility], Dr. Paulita Fujita, urgent: diarrhea x 4 weeks. Has been seen there before.   Thanks!

## 2018-01-18 NOTE — Telephone Encounter (Signed)
Pt stopped by the office to check status on this to see if a prior auth has been done so pt could get Rx Colestid, if Rx needs to be changed she is ok with that. pt would like a call back, please advise.

## 2018-01-18 NOTE — Telephone Encounter (Signed)
Prior Authorization completed. Call Ref # [id number]

## 2018-01-23 ENCOUNTER — Other Ambulatory Visit: Payer: Self-pay | Admitting: Physical Medicine & Rehabilitation

## 2018-01-23 DIAGNOSIS — G5602 Carpal tunnel syndrome, left upper limb: Secondary | ICD-10-CM

## 2018-01-23 DIAGNOSIS — M797 Fibromyalgia: Secondary | ICD-10-CM

## 2018-01-25 ENCOUNTER — Telehealth: Payer: Self-pay | Admitting: Family Medicine

## 2018-01-25 NOTE — Telephone Encounter (Signed)
Copied from [place] [id number]. Topic: Quick Communication - See Telephone Encounter >> Jan 25, 2018  2:05 PM Robin Morgan, Helene Kelp D wrote: CRM for notification. See Telephone encounter for: 01/25/18. Patient called and would like a call back from [place] regarding her medication colestipol (COLESTID) 5 g granules. She said that her insurance won't pay for it and wants to talk to her to see what other med she can take to replace it. Please call patient back, thanks.

## 2018-01-25 NOTE — Telephone Encounter (Signed)
Spoke with Patient and she states that her insurance denied her prior auth for Colestid. Patient wants to know if there is another medication she can take in place of Colestid.   Please advise.   Doloris Hall,  LPN

## 2018-01-25 NOTE — Telephone Encounter (Signed)
Patient informed and Patient has appt with Dr. Paulita Fujita on 02/02/2018.

## 2018-01-25 NOTE — Telephone Encounter (Signed)
Recommend lomotil. Please f/u on referral to [facility]: she needs to be seen ASAP. Referral was placed 2 weeks ago. Thanks.

## 2018-01-31 ENCOUNTER — Encounter: Payer: Self-pay | Admitting: Physical Medicine & Rehabilitation

## 2018-01-31 ENCOUNTER — Encounter: Payer: Medicaid Other | Attending: Physical Medicine and Rehabilitation | Admitting: Physical Medicine & Rehabilitation

## 2018-01-31 VITALS — BP 99/62 | HR 85 | Ht 61.0 in | Wt 146.0 lb

## 2018-01-31 DIAGNOSIS — M75102 Unspecified rotator cuff tear or rupture of left shoulder, not specified as traumatic: Secondary | ICD-10-CM | POA: Diagnosis not present

## 2018-01-31 DIAGNOSIS — M75101 Unspecified rotator cuff tear or rupture of right shoulder, not specified as traumatic: Secondary | ICD-10-CM

## 2018-01-31 DIAGNOSIS — G5602 Carpal tunnel syndrome, left upper limb: Secondary | ICD-10-CM | POA: Diagnosis not present

## 2018-01-31 DIAGNOSIS — M609 Myositis, unspecified: Secondary | ICD-10-CM | POA: Diagnosis present

## 2018-01-31 DIAGNOSIS — Z5181 Encounter for therapeutic drug level monitoring: Secondary | ICD-10-CM

## 2018-01-31 DIAGNOSIS — Z79899 Other long term (current) drug therapy: Secondary | ICD-10-CM

## 2018-01-31 DIAGNOSIS — M797 Fibromyalgia: Secondary | ICD-10-CM | POA: Diagnosis not present

## 2018-01-31 DIAGNOSIS — M961 Postlaminectomy syndrome, not elsewhere classified: Secondary | ICD-10-CM | POA: Diagnosis present

## 2018-01-31 MED ORDER — DICLOFENAC SODIUM 50 MG PO TBEC: 50.0000 mg | DELAYED_RELEASE_TABLET | Freq: Two times a day (BID) | ORAL | 3 refills | Status: DC

## 2018-01-31 MED ORDER — HYDROCODONE-ACETAMINOPHEN 7.5-325 MG PO TABS: 1.0000 | ORAL_TABLET | Freq: Three times a day (TID) | ORAL | 0 refills | Status: DC | PRN

## 2018-01-31 NOTE — Patient Instructions (Signed)
PLEASE FEEL FREE TO CALL OUR OFFICE WITH ANY PROBLEMS OR QUESTIONS ([phone])      

## 2018-01-31 NOTE — Progress Notes (Signed)
Subjective:    Patient ID: Robin Morgan, female    DOB: [birth year], 62 y.o.   MRN: [id number]  HPI   Robin Morgan is here in follow up of her chronic pain.  She is had increased bilateral shoulder pain over the last few months.  She has a hard time performing activities in front or overhead.  She struggles lifting groceries into her car.  Pain runs from her shoulders into lateral arm and wrists.  She has had carpal tunnel surgery performed by orthopedic surgery with improvement in her hand symptoms.  She is also having tightness along her lower neck and shoulder blades which inhibits movement also.  Other areas of concern include her mid back with radiating pain laterally to the abdomen.  Low back is tender as well at times.  She remains on hydrocodone for pain control..  She is also using gabapentin and Cymbalta as well as Voltaren gel.  Pain Inventory Average Pain 9 Pain Right Now 8 My pain is constant and aching  In the last 24 hours, has pain interfered with the following? General activity 8 Relation with others 8 Enjoyment of life 8 What TIME of day is your pain at its worst? all Sleep (in general) Fair  Pain is worse with: sitting and standing Pain improves with: medication Relief from Meds: 8  Mobility ability to climb steps?  yes do you drive?  yes  Function disabled: date disabled .  Neuro/Psych bladder control problems bowel control problems spasms confusion  Prior Studies Any changes since last visit?  no  Physicians involved in your care Any changes since last visit?  no   Family History  Problem Relation Age of Onset  . Alzheimer's disease Mother   . Diabetes Father   . Heart disease Maternal Grandmother   . Breast cancer Paternal Grandmother 63   Social History   Socioeconomic History  . Marital status: Divorced    Spouse name: Not on file  . Number of children: 1  . Years of education: Not on file  . Highest education level: Not on file    Occupational History  . Occupation: disabled  Social Needs  . Financial resource strain: Not on file  . Food insecurity:    Worry: Not on file    Inability: Not on file  . Transportation needs:    Medical: Not on file    Non-medical: Not on file  Tobacco Use  . Smoking status: Never Smoker  . Smokeless tobacco: Never Used  Substance and Sexual Activity  . Alcohol use: No  . Drug use: No  . Sexual activity: Not Currently  Lifestyle  . Physical activity:    Days per week: Not on file    Minutes per session: Not on file  . Stress: Not on file  Relationships  . Social connections:    Talks on phone: Not on file    Gets together: Not on file    Attends religious service: Not on file    Active member of club or organization: Not on file    Attends meetings of clubs or organizations: Not on file    Relationship status: Not on file  Other Topics Concern  . Not on file  Social History Narrative  . Not on file   Past Surgical History:  Procedure Laterality Date  . ABDOMINAL HYSTERECTOMY     1986  . APPENDECTOMY     1983  . BREAST EXCISIONAL BIOPSY Right 1990's  . BREAST  SURGERY     rt breast cyst done in the 90's  . CHOLECYSTECTOMY    . GALLBLADDER SURGERY     1983  . ROTATOR CUFF REPAIR     right  . SPINE SURGERY    . TONSILLECTOMY     1973   Past Medical History:  Diagnosis Date  . Calcific tendonitis   . Cervical spondylosis without myelopathy   . Depression   . Diabetes mellitus   . Esophageal stricture   . Fibromyalgia   . GERD (gastroesophageal reflux disease)   . Hiatal hernia   . Hyperlipidemia   . Hypertension    There were no vitals taken for this visit.  Opioid Risk Score:   Fall Risk Score:  `1  Depression screen PHQ 2/9  Depression screen [facility] 2/9 12/07/2017 11/09/2017 10/27/2017 07/29/2017 06/03/2017 04/08/2016 10/08/2015  Decreased Interest 0 0 3 3 1 2 2   Down, Depressed, Hopeless 0 0 3 3 1  0 0  PHQ - 2 Score 0 0 6 6 2 2 2   Altered sleeping  - 0 - - - - -  Tired, decreased energy - 0 - - - - -  Change in appetite - 0 - - - - -  Feeling bad or failure about yourself  - 0 - - - - -  Trouble concentrating - 0 - - - - -  Moving slowly or fidgety/restless - 0 - - - - -  Suicidal thoughts - 0 - - - - -  PHQ-9 Score - 0 - - - - -     Review of Systems  Constitutional: Positive for diaphoresis and unexpected weight change.  HENT: Negative.   Eyes: Negative.   Respiratory: Positive for apnea.   Cardiovascular: Negative.   Gastrointestinal: Positive for diarrhea and nausea.  Endocrine: Negative.   Genitourinary: Negative.   Musculoskeletal: Positive for arthralgias, back pain and myalgias.  Skin: Negative.   Allergic/Immunologic: Negative.   Neurological: Negative.   Hematological: Negative.   Psychiatric/Behavioral: Negative.   All other systems reviewed and are negative.      Objective:   Physical Exam  General: No acute distress HEENT: EOMI, oral membranes moist Cards: reg rate  Chest: normal effort Abdomen: Soft, NT, ND Skin: dry, intact Extremities: no edema.  Musculoskeletal:   Patient with ongoing bilateral shoulder pain prickly in the deltoid subacromial area.  And impingement maneuvers are positive bilaterally.  Patient had tenderness with resisted abduction as well.  Scapular muscles were tight and scapular range of motion was reduced.  Sitting posture was fair but somewhat head forward.  Low back with generalized tenderness to palpation.  Fair range of motion Neurological: She is alert and oriented to person, place, and time.  Skin: Skin is warm and dry.  Psychiatric: Patient pleasant and appropriate as always      Assessment & Plan:  1. Fibromyalgia with myofascial pain: Continue exercise and heat therapy.  Maintain physical activity to tolerance.  2. Rotator cuff syndrome on the right>left: provided rotator cuff exercises, and scapular ROM exercises today.  Exercises were reviewed with patient.   Consider formal physical therapy and/or injections depending upon progress 3. Cervicalgia. Post-laminectomy syndrome, facet arthropathy: Cervical Radiculopathy: Continue Gabapentin.  4. Depression: Continue Cymbalta. PCP increase dose to 90 mg daily. 5. Mid/low back pain/ Lumbar Spondylosis: Continue Current Medication and stretching and heat therapy.  Maintain home exercise program and range of motion to tolerance Refilled: Hydrocodone 7.5/325 mg one tablet every 8 hours as  needed for moderate pain #80. We will continue the controlled substance monitoring program, this consists of regular clinic visits, examinations, routine drug screening, pill counts as well as use of [place] Controlled Substance Reporting System. [place] was reviewed today.   6. Lumbar Radiculopathy: Maintain posture and appropriate body mechanics as well as home exercise program 7OA of right hand:  Continue using Voltaren gel and heat therapy.  Also ordered oral Voltaren 50 mg twice daily with food.  This may be helpful for her shoulder symptoms as well. 8. Migraines: No complaints today. Continue to Monitor  9. Left CTS:S/P EMG on 03/08/2017: S/P Carpal Tunnel Release on 06/14/2017 via . Dr. Apolonio Schneiders.    20 minutes of face to face patient care time was spent during this visit. All questions were encouraged and answered.

## 2018-02-04 LAB — TOXASSURE SELECT,+ANTIDEPR,UR

## 2018-02-07 ENCOUNTER — Telehealth: Payer: Self-pay | Admitting: *Deleted

## 2018-02-07 NOTE — Telephone Encounter (Signed)
Urine drug screen for this encounter is consistent for prescribed medication 

## 2018-02-08 ENCOUNTER — Other Ambulatory Visit: Payer: Self-pay

## 2018-02-08 ENCOUNTER — Encounter: Payer: Self-pay | Admitting: Family Medicine

## 2018-02-08 ENCOUNTER — Ambulatory Visit: Payer: Medicaid Other | Admitting: Family Medicine

## 2018-02-08 VITALS — BP 108/64 | HR 91 | Temp 98.3°F | Ht 61.0 in | Wt 147.0 lb

## 2018-02-08 DIAGNOSIS — E1140 Type 2 diabetes mellitus with diabetic neuropathy, unspecified: Secondary | ICD-10-CM

## 2018-02-08 DIAGNOSIS — M797 Fibromyalgia: Secondary | ICD-10-CM

## 2018-02-08 DIAGNOSIS — I10 Essential (primary) hypertension: Secondary | ICD-10-CM | POA: Diagnosis not present

## 2018-02-08 DIAGNOSIS — Z794 Long term (current) use of insulin: Secondary | ICD-10-CM | POA: Diagnosis not present

## 2018-02-08 LAB — POCT GLYCOSYLATED HEMOGLOBIN (HGB A1C): HEMOGLOBIN A1C: 6.7 % — AB (ref 4.0–5.6)

## 2018-02-08 NOTE — Progress Notes (Signed)
Subjective  CC:  Chief Complaint  Patient presents with  . Diabetes    f/u from 11/09/2017, doing well     HPI: Robin Morgan is a 62 y.o. female who presents to the office today for follow up of diabetes and problems listed above in the chief complaint.  Here for follow-up of diabetes, hypertension, chronic back pain due to fibromyalgia and DDD.  Diabetes follow up: Her diabetic control is reported as Unchanged.  She is doing well with her diabetes.  Tolerating her medications without side effects.  Shanda Bumps has not made any difference in her chronic diarrhea that is being evaluated by GI.  She is using IBguard she has helped decreased her bowel movements.  She is set up for an EGD due to dysphagia. She denies exertional CP or SOB or symptomatic hypoglycemia. She denies foot sores or paresthesias.  Patient is due for her diabetic eye exam.  She had to reschedule.  Hypertension: Good control.  On ACE inhibitor.  Fibromyalgia: Complains of chronic upper back pain managed by Dr. Tessa Lerner.  On chronic pain medications.  She does have a feeling this probably contributes to her upper back pain.  She is on Neurontin, Cymbalta, Flexeril for pain management.  Assessment  1. Controlled type 2 diabetes mellitus with diabetic neuropathy, with long-term current use of insulin ([place])   2. Essential hypertension   3. Fibromyalgia      Plan   Diabetes is currently very well controlled.  Continue current diabetes medicines  Hypertension follow-up:This medical condition is well controlled. There are no signs of complications, medication side effects, or red flags. Patient is instructed to continue the current treatment plan without change in therapies or medications.   Fibromyalgia and upper back pain: Counseling done.  Consider breast reduction surgery in future patient's insurance changes to qualify for payment.  Health maintenance: Due for complete physical with lab work in November.  Follow  up: CPE in November. Orders Placed This Encounter  Procedures  . POCT glycosylated hemoglobin (Hb A1C)   No orders of the defined types were placed in this encounter.     Immunization History  Administered Date(s) Administered  . Influenza Split 06/04/2009, 05/02/2012  . Influenza, Quadrivalent, Recombinant, Inj, Pf 05/12/2016, 07/07/2017  . Influenza, Seasonal, Injecte, Preservative Fre 06/06/2014, 04/24/2015  . Influenza-Unspecified 09/03/2011, 04/19/2013  . Pneumococcal Polysaccharide-23 08/29/2009  . Tdap 05/11/2012     Diabetes Related Lab Review: Lab Results  Component Value Date   HGBA1C 6.7 (A) 02/08/2018    No results found for: Derl Barrow Lab Results  Component Value Date   CREATININE 0.83 12/31/2017   BUN 13 12/31/2017   NA 141 12/31/2017   K 3.7 12/31/2017   CL 105 12/31/2017   CO2 25 12/31/2017   No results found for: CHOL No results found for: HDL No results found for: [place] No results found for: TRIG No results found for: CHOLHDL No results found for: LDLDIRECT The ASCVD Risk score Mikey Bussing DC Jr., et al., 2013) failed to calculate for the following reasons:   Cannot find a previous HDL lab I have reviewed the [place], Fam and Soc history. Patient Active Problem List   Diagnosis Date Noted  . Diabetic peripheral neuropathy associated with type 2 diabetes mellitus ([place]) 01/06/2017    Priority: High  . OSA (obstructive sleep apnea) 03/10/2016    Priority: High  . Controlled diabetes mellitus with diabetic neuropathy, with long-term current use of insulin ([place]) 10/11/2014  Priority: High  . HTN (hypertension) 10/11/2014    Priority: High  . Hyperlipidemia 10/11/2014    Priority: High  . DDD (degenerative disc disease), lumbar 01/15/2014    Priority: High  . Hypothyroidism, acquired 06/12/2013    Priority: High  . Cervical post-laminectomy syndrome 05/10/2013    Priority: High  . Chronic pain associated with significant psychosocial  dysfunction 01/03/2010    Priority: High    Overview:  Chronic Pain Syndrome, Dr. Earl Lites  Overview:  Overview:  Overview:  Chronic Pain Syndrome, Dr. Earl Lites   . Esophageal stricture     Priority: Medium  . Tenosynovitis, wrist 10/07/2017    Priority: Medium  . Trigger thumb of left hand 10/07/2017    Priority: Medium  . Laryngopharyngeal reflux (LPR) 08/26/2017    Priority: Medium  . Dysphagia 08/26/2017    Priority: Medium  . Left carpal tunnel syndrome 03/08/2017    Priority: Medium  . Fibromyalgia 02/12/2016    Priority: Medium  . Osteopenia 12/01/2012    Priority: Medium    10/2017: osteopenia: T = -1.9; stable. Recheck 2 years T = -1.2 at hip, -1.8 at spine, 4/ 2014   . Chronic diarrhea 07/04/2012    Priority: Medium    Overview:  ? Related to gallbladder surgery. Has been evaluated by Dr. Amedeo Plenty GI. Lomotil as needed.     Social History: Patient  reports that she has never smoked. She has never used smokeless tobacco. She reports that she does not drink alcohol or use drugs.  Review of Systems: Ophthalmic: negative for eye pain, loss of vision or double vision Cardiovascular: negative for chest pain Respiratory: negative for SOB or persistent cough Gastrointestinal: negative for abdominal pain Genitourinary: negative for dysuria or gross hematuria MSK: negative for foot lesions Neurologic: negative for weakness or gait disturbance  Objective  Vitals: BP 108/64   Pulse 91   Temp 98.3 F (36.8 C)   Ht 5\' 1"  (1.549 m)   Wt 147 lb (66.7 kg)   SpO2 98%   BMI 27.78 kg/m  General: well appearing, no acute distress  Psych:  Alert and oriented, normal mood and affect HEENT:  Normocephalic, atraumatic, moist mucous membranes, supple neck  Cardiovascular:  Nl S1 and S2, RRR without murmur, gallop or rub. no edema Respiratory:  Good breath sounds bilaterally, CTAB with normal effort, no rales Gastrointestinal: normal BS, soft, nontender Skin:  Warm,  no rashes Neurologic:   Mental status is normal. normal gait Back: Multiple trigger points are positive in the upper and lower back. Foot exam: no erythema, pallor, or cyanosis visible nl proprioception and sensation to monofilament testing bilaterally, +2 distal pulses bilaterally    Diabetic education: ongoing education regarding chronic disease management for diabetes was given today. We continue to reinforce the ABC's of diabetic management: A1c (<7 or 8 dependent upon patient), tight blood pressure control, and cholesterol management with goal LDL < 100 minimally. We discuss diet strategies, exercise recommendations, medication options and possible side effects. At each visit, we review recommended immunizations and preventive care recommendations for diabetics and stress that good diabetic control can prevent other problems. See below for this patient's data.    Commons side effects, risks, benefits, and alternatives for medications and treatment plan prescribed today were discussed, and the patient expressed understanding of the given instructions. Patient is instructed to call or message via MyChart if he/she has any questions or concerns regarding our treatment plan. No barriers to understanding were identified. We discussed Red  Flag symptoms and signs in detail. Patient expressed understanding regarding what to do in case of urgent or emergency type symptoms.   Medication list was reconciled, printed and provided to the patient in AVS. Patient instructions and summary information was reviewed with the patient as documented in the AVS. This note was prepared with assistance of Dragon voice recognition software. Occasional wrong-word or sound-a-like substitutions may have occurred due to the inherent limitations of voice recognition software

## 2018-02-08 NOTE — Patient Instructions (Signed)
Please return in November for your annual complete physical; please come fasting.   If you have any questions or concerns, please don't hesitate to send me a message via MyChart or call the office at [phone]. Thank you for visiting with us today! It's our pleasure caring for you.   

## 2018-02-16 ENCOUNTER — Encounter: Payer: Self-pay | Admitting: Family Medicine

## 2018-02-17 ENCOUNTER — Other Ambulatory Visit: Payer: Self-pay | Admitting: Emergency Medicine

## 2018-02-18 MED ORDER — LEVOTHYROXINE SODIUM 88 MCG PO TABS: 88.0000 ug | ORAL_TABLET | Freq: Every day | ORAL | 3 refills | Status: DC

## 2018-02-18 NOTE — Telephone Encounter (Signed)
Received and reviewed medication refill request.  Request is appropriate and was approved.  Please see medication orders for refill.  TSH 05/2017 [place] NGMA: 2.38

## 2018-02-25 ENCOUNTER — Encounter: Payer: Self-pay | Admitting: Family Medicine

## 2018-02-25 ENCOUNTER — Other Ambulatory Visit: Payer: Self-pay | Admitting: Emergency Medicine

## 2018-02-25 MED ORDER — DIPHENOXYLATE-ATROPINE 2.5-0.025 MG PO TABS: 1.0000 | ORAL_TABLET | Freq: Four times a day (QID) | ORAL | 0 refills | Status: DC | PRN

## 2018-03-03 ENCOUNTER — Encounter: Payer: Self-pay | Admitting: Family Medicine

## 2018-03-03 MED ORDER — ZOLPIDEM TARTRATE 10 MG PO TABS: 10.0000 mg | ORAL_TABLET | Freq: Every evening | ORAL | 5 refills | Status: DC | PRN

## 2018-03-03 NOTE — Telephone Encounter (Signed)
Last OV: 02/08/2018 Last Fill: 11/09/2017

## 2018-03-03 NOTE — Telephone Encounter (Signed)
Please call to clarify: who has been prescribing the cymbalta? I think it may be from PM&R? Let me know if I have been; can clarify from pharmacy or pt. I have refilled the ambien.

## 2018-03-03 NOTE — Telephone Encounter (Signed)
Patient clarified she states that on the Prescription bottle it was refilled by you.

## 2018-03-04 MED ORDER — DULOXETINE HCL 30 MG PO CPEP: 90.0000 mg | ORAL_CAPSULE | Freq: Every day | ORAL | 5 refills | Status: DC

## 2018-03-07 ENCOUNTER — Encounter: Payer: Self-pay | Admitting: Physical Medicine & Rehabilitation

## 2018-03-07 ENCOUNTER — Other Ambulatory Visit: Payer: Self-pay

## 2018-03-07 ENCOUNTER — Encounter: Payer: Medicaid Other | Attending: Physical Medicine and Rehabilitation | Admitting: Physical Medicine & Rehabilitation

## 2018-03-07 DIAGNOSIS — G5602 Carpal tunnel syndrome, left upper limb: Secondary | ICD-10-CM | POA: Diagnosis not present

## 2018-03-07 DIAGNOSIS — M609 Myositis, unspecified: Secondary | ICD-10-CM | POA: Diagnosis not present

## 2018-03-07 DIAGNOSIS — M75101 Unspecified rotator cuff tear or rupture of right shoulder, not specified as traumatic: Secondary | ICD-10-CM | POA: Diagnosis not present

## 2018-03-07 DIAGNOSIS — M961 Postlaminectomy syndrome, not elsewhere classified: Secondary | ICD-10-CM | POA: Insufficient documentation

## 2018-03-07 DIAGNOSIS — M797 Fibromyalgia: Secondary | ICD-10-CM

## 2018-03-07 DIAGNOSIS — M7918 Myalgia, other site: Secondary | ICD-10-CM

## 2018-03-07 DIAGNOSIS — M75102 Unspecified rotator cuff tear or rupture of left shoulder, not specified as traumatic: Secondary | ICD-10-CM

## 2018-03-07 MED ORDER — HYDROCODONE-ACETAMINOPHEN 7.5-325 MG PO TABS: 1.0000 | ORAL_TABLET | Freq: Three times a day (TID) | ORAL | 0 refills | Status: DC | PRN

## 2018-03-07 NOTE — Patient Instructions (Signed)
PLEASE FEEL FREE TO CALL OUR OFFICE WITH ANY PROBLEMS OR QUESTIONS ([phone])      

## 2018-03-07 NOTE — Progress Notes (Signed)
Subjective:    Patient ID: Robin Morgan, female    DOB: 1955/10/30, 62 y.o.   MRN: [id number]  HPI   Robin Morgan is here in follow-up of her chronic pain.  She came in complaining of increased shoulder pain at her last visit.  I provided her extensive shoulder range of motion and exercises at that time.She states that her back and shoulder pain really hasn't changed despite doing the provided exercises. She finds that very little relieves the pain at this point.   She remains on hydrocodone 7.5 for pain relief q8 prn, in addition to flexeril and gabapentin, cymbalta. Lorrin Mais helps her sleep.       Pain Inventory Average Pain 9 Pain Right Now 8 My pain is constant and sharp  In the last 24 hours, has pain interfered with the following? General activity 9 Relation with others 9 Enjoyment of life 9 What TIME of day is your pain at its worst? daytime evening night Sleep (in general) Fair  Pain is worse with: walking, sitting and standing Pain improves with: medication Relief from Meds: 3  Mobility ability to climb steps?  yes do you drive?  yes  Function disabled: date disabled n/a  Neuro/Psych spasms  Prior Studies Any changes since last visit?  no  Physicians involved in your care Any changes since last visit?  no   Family History  Problem Relation Age of Onset  . Alzheimer's disease Mother   . Diabetes Father   . Heart disease Maternal Grandmother   . Breast cancer Paternal Grandmother 80   Social History   Socioeconomic History  . Marital status: Divorced    Spouse name: Not on file  . Number of children: 1  . Years of education: Not on file  . Highest education level: Not on file  Occupational History  . Occupation: disabled  Social Needs  . Financial resource strain: Not on file  . Food insecurity:    Worry: Not on file    Inability: Not on file  . Transportation needs:    Medical: Not on file    Non-medical: Not on file  Tobacco Use  . Smoking  status: Never Smoker  . Smokeless tobacco: Never Used  Substance and Sexual Activity  . Alcohol use: No  . Drug use: No  . Sexual activity: Not Currently  Lifestyle  . Physical activity:    Days per week: Not on file    Minutes per session: Not on file  . Stress: Not on file  Relationships  . Social connections:    Talks on phone: Not on file    Gets together: Not on file    Attends religious service: Not on file    Active member of club or organization: Not on file    Attends meetings of clubs or organizations: Not on file    Relationship status: Not on file  Other Topics Concern  . Not on file  Social History Narrative  . Not on file   Past Surgical History:  Procedure Laterality Date  . ABDOMINAL HYSTERECTOMY     1986  . APPENDECTOMY     1983  . BREAST EXCISIONAL BIOPSY Right 1990's  . BREAST SURGERY     rt breast cyst done in the 90's  . CHOLECYSTECTOMY    . GALLBLADDER SURGERY     1983  . ROTATOR CUFF REPAIR     right  . SPINE SURGERY    . TONSILLECTOMY  9   Past Medical History:  Diagnosis Date  . Calcific tendonitis   . Cervical spondylosis without myelopathy   . Depression   . Diabetes mellitus   . Esophageal stricture   . Fibromyalgia   . GERD (gastroesophageal reflux disease)   . Hiatal hernia   . Hyperlipidemia   . Hypertension    BP 118/69   Pulse 92   Ht 5\' 1"  (1.549 m)   Wt 147 lb 3.2 oz (66.8 kg)   SpO2 94%   BMI 27.81 kg/m    Opioid Risk Score:   Fall Risk Score:  `1  Depression screen PHQ 2/9  Depression screen [facility] 2/9 03/07/2018 02/08/2018 12/07/2017 11/09/2017 10/27/2017 07/29/2017 06/03/2017  Decreased Interest 1 0 0 0 3 3 1   Down, Depressed, Hopeless 1 0 0 0 3 3 1   PHQ - 2 Score 2 0 0 0 6 6 2   Altered sleeping - 0 - 0 - - -  Tired, decreased energy - 0 - 0 - - -  Change in appetite - 0 - 0 - - -  Feeling bad or failure about yourself  - 0 - 0 - - -  Trouble concentrating - 0 - 0 - - -  Moving slowly or fidgety/restless -  0 - 0 - - -  Suicidal thoughts - 0 - 0 - - -  PHQ-9 Score - 0 - 0 - - -  Difficult doing work/chores - Not difficult at all - - - - -    Review of Systems  Constitutional: Positive for unexpected weight change.  HENT: Negative.   Eyes: Negative.   Respiratory: Positive for apnea and shortness of breath. Negative for wheezing.   Gastrointestinal: Positive for nausea.  Endocrine: Negative.   Genitourinary: Negative.   Musculoskeletal: Negative.   Skin: Negative.   Allergic/Immunologic: Negative.   Neurological: Negative.   Hematological: Negative.   Psychiatric/Behavioral: Negative.   All other systems reviewed and are negative.      Objective:   Physical Exam  General: No acute distress HEENT: EOMI, oral membranes moist Cards: reg rate  Chest: normal effort Abdomen: Soft, NT, ND Skin: dry, intact Extremities: no edema Musculoskeletal: Bilateral RTC signs. Right rhomboids with taut bands, upper trap also. Generalized bilateral trap tightness. Head forward postrue.     Low back with generalized tenderness to palpation.  Fair range of motion Neurological: She isalertand oriented to person, place, and time.  Skin: Skin iswarmand dry.  Psychiatric:  pleasant   Assessment & Plan:  1. Fibromyalgia with myofascial pain: Continue exercise and heat therapy.  Maintain physical activity to tolerance.  2. Rotator cuff syndrome on the right>left:  continue rotator cuff exercises, and scapular ROM exercises as she's been doing.     3. Cervicalgia. Post-laminectomy syndrome, facet arthropathy: Cervical Radiculopathy: Continue Gabapentin.  4. Depression: Continue Cymbalta. PCP increase dose to 90 mg daily. 5. Mid/low back pain/ Lumbar Spondylosis: right neck and shoulder girdle pain appears largely myofascial.   -After informed consent and preparation of the skin with isopropyl alcohol, I injected the right upper trap and rhomboid (2) each with 2cc of 1% lidocaine. The patient  tolerated well, and no complications were experienced. Post-injection instructions were provided.   -consider trial of formal PT  -consider further imaging of cervical spine  - Refilled: Hydrocodone 7.5/325 mg one tablet every 8 hours as needed for moderate pain #80. We will continue the controlled substance monitoring program, this consists of regular clinic visits, examinations,  routine drug screening, pill counts as well as use of [place] Controlled Substance Reporting System. [place] was [place] today.   6. Lumbar Radiculopathy: Maintain posture and appropriate body mechanics as well as home exercise program 7OA of right hand:   Voltaren gel and heat therapy.    oral Voltaren 50 mg twice daily with food.   . 8. Migraines: No complaints today. Continue to Monitor  9. Left CTS:S/P EMG on 03/08/2017: S/P Carpal Tunnel Release on 06/14/2017 via . Dr. Apolonio Schneiders.    20 minutes of face to face patient care time was spent during this visit. All questions were encouraged and answered.

## 2018-03-17 ENCOUNTER — Other Ambulatory Visit: Payer: Self-pay | Admitting: Emergency Medicine

## 2018-03-17 MED ORDER — FENOFIBRATE 160 MG PO TABS: 160.0000 mg | ORAL_TABLET | Freq: Every day | ORAL | 3 refills | Status: DC

## 2018-03-17 MED ORDER — ATORVASTATIN CALCIUM 40 MG PO TABS: 40.0000 mg | ORAL_TABLET | Freq: Every day | ORAL | 3 refills | Status: DC

## 2018-03-17 MED ORDER — MAGNESIUM GLUCONATE 500 MG PO TABS: 500.0000 mg | ORAL_TABLET | Freq: Every day | ORAL | 3 refills | Status: DC

## 2018-03-17 MED ORDER — DIPHENOXYLATE-ATROPINE 2.5-0.025 MG PO TABS: 1.0000 | ORAL_TABLET | Freq: Four times a day (QID) | ORAL | 0 refills | Status: DC | PRN

## 2018-03-17 MED ORDER — TRIAMCINOLONE ACETONIDE 0.1 % EX CREA: 1.0000 "application " | TOPICAL_CREAM | Freq: Two times a day (BID) | CUTANEOUS | 1 refills | Status: DC

## 2018-03-17 MED ORDER — ESOMEPRAZOLE MAGNESIUM 40 MG PO CPDR: 40.0000 mg | DELAYED_RELEASE_CAPSULE | Freq: Two times a day (BID) | ORAL | 3 refills | Status: DC

## 2018-03-17 MED ORDER — LISINOPRIL 10 MG PO TABS: 10.0000 mg | ORAL_TABLET | Freq: Every day | ORAL | 3 refills | Status: DC

## 2018-03-17 NOTE — Telephone Encounter (Signed)
Prescriptions sent to Requested Pharmacy.

## 2018-04-06 ENCOUNTER — Encounter: Payer: Medicaid Other | Attending: Physical Medicine and Rehabilitation | Admitting: Physical Medicine & Rehabilitation

## 2018-04-06 ENCOUNTER — Encounter: Payer: Self-pay | Admitting: Physical Medicine & Rehabilitation

## 2018-04-06 DIAGNOSIS — M609 Myositis, unspecified: Secondary | ICD-10-CM | POA: Diagnosis present

## 2018-04-06 DIAGNOSIS — G5602 Carpal tunnel syndrome, left upper limb: Secondary | ICD-10-CM | POA: Diagnosis not present

## 2018-04-06 DIAGNOSIS — M961 Postlaminectomy syndrome, not elsewhere classified: Secondary | ICD-10-CM | POA: Diagnosis present

## 2018-04-06 DIAGNOSIS — M75101 Unspecified rotator cuff tear or rupture of right shoulder, not specified as traumatic: Secondary | ICD-10-CM | POA: Diagnosis not present

## 2018-04-06 DIAGNOSIS — M75102 Unspecified rotator cuff tear or rupture of left shoulder, not specified as traumatic: Secondary | ICD-10-CM | POA: Diagnosis not present

## 2018-04-06 DIAGNOSIS — M797 Fibromyalgia: Secondary | ICD-10-CM | POA: Diagnosis not present

## 2018-04-06 MED ORDER — HYDROCODONE-ACETAMINOPHEN 7.5-325 MG PO TABS: 1.0000 | ORAL_TABLET | Freq: Three times a day (TID) | ORAL | 0 refills | Status: DC | PRN

## 2018-04-06 NOTE — Patient Instructions (Signed)
YOU NEED TO KEEP UP WITH A DAILY STRETCHING REGIMEN.   REMEMBER YOUR POSTURE

## 2018-04-06 NOTE — Progress Notes (Signed)
Subjective:    Patient ID: Robin Morgan, female    DOB: 09-02-1955, 62 y.o.   MRN: [id number]  HPI   [name] is here in follow up of her chronic pain. She had some temporary relief with the TPI's.  She continues to have pain in her neck with radiation anteriorly as well as inferior to her shoulders and upper back.  She continues on hydrocodone for pain control which provides some relief.  Gabapentin also helps with her generalized pain.  She has concerns due to weight gain and increased appetite with the gabapentin but understands that it really is helping with her pain.  She states that she does her stretches fairly regular for her shoulders and back.  She is not doing as much for her neck however.  She has complained of some pain in the anterior neck along the right side.  She has seen a ENT in the past who found no abnormalities in her oral pharyngeal area.  I did review her most recent MRI from 2017 which shows numerous areas of spondylosis and osteophytes.    Pain Inventory Average Pain 9 Pain Right Now 8 My pain is constant and sharp  In the last 24 hours, has pain interfered with the following? General activity 10 Relation with others 9 Enjoyment of life 9 What TIME of day is your pain at its worst? night Sleep (in general) Fair  Pain is worse with: sitting and standing Pain improves with: medication Relief from Meds: na  Mobility walk without assistance ability to climb steps?  yes do you drive?  yes  Function disabled: date disabled .  Neuro/Psych bladder control problems bowel control problems spasms  Prior Studies Any changes since last visit?  no  Physicians involved in your care Any changes since last visit?  no   Family History  Problem Relation Age of Onset  . Alzheimer's disease Mother   . Diabetes Father   . Heart disease Maternal Grandmother   . Breast cancer Paternal Grandmother 47   Social History   Socioeconomic History  . Marital status:  Divorced    Spouse name: Not on file  . Number of children: 1  . Years of education: Not on file  . Highest education level: Not on file  Occupational History  . Occupation: disabled  Social Needs  . Financial resource strain: Not on file  . Food insecurity:    Worry: Not on file    Inability: Not on file  . Transportation needs:    Medical: Not on file    Non-medical: Not on file  Tobacco Use  . Smoking status: Never Smoker  . Smokeless tobacco: Never Used  Substance and Sexual Activity  . Alcohol use: No  . Drug use: No  . Sexual activity: Not Currently  Lifestyle  . Physical activity:    Days per week: Not on file    Minutes per session: Not on file  . Stress: Not on file  Relationships  . Social connections:    Talks on phone: Not on file    Gets together: Not on file    Attends religious service: Not on file    Active member of club or organization: Not on file    Attends meetings of clubs or organizations: Not on file    Relationship status: Not on file  Other Topics Concern  . Not on file  Social History Narrative  . Not on file   Past Surgical History:  Procedure Laterality Date  . ABDOMINAL HYSTERECTOMY     1986  . APPENDECTOMY     1983  . BREAST EXCISIONAL BIOPSY Right 1990's  . BREAST SURGERY     rt breast cyst done in the 90's  . CHOLECYSTECTOMY    . GALLBLADDER SURGERY     1983  . ROTATOR CUFF REPAIR     right  . SPINE SURGERY    . TONSILLECTOMY     1973   Past Medical History:  Diagnosis Date  . Calcific tendonitis   . Cervical spondylosis without myelopathy   . Depression   . Diabetes mellitus   . Esophageal stricture   . Fibromyalgia   . GERD (gastroesophageal reflux disease)   . Hiatal hernia   . Hyperlipidemia   . Hypertension    There were no vitals taken for this visit.  Opioid Risk Score:   Fall Risk Score:  `1  Depression screen PHQ 2/9  Depression screen [facility] 2/9 03/07/2018 02/08/2018 12/07/2017 11/09/2017 10/27/2017  07/29/2017 06/03/2017  Decreased Interest 1 0 0 0 3 3 1   Down, Depressed, Hopeless 1 0 0 0 3 3 1   PHQ - 2 Score 2 0 0 0 6 6 2   Altered sleeping - 0 - 0 - - -  Tired, decreased energy - 0 - 0 - - -  Change in appetite - 0 - 0 - - -  Feeling bad or failure about yourself  - 0 - 0 - - -  Trouble concentrating - 0 - 0 - - -  Moving slowly or fidgety/restless - 0 - 0 - - -  Suicidal thoughts - 0 - 0 - - -  PHQ-9 Score - 0 - 0 - - -  Difficult doing work/chores - Not difficult at all - - - - -     Review of Systems  Constitutional: Positive for unexpected weight change.  Eyes: Negative.   Respiratory: Negative.   Cardiovascular: Negative.   Gastrointestinal: Positive for diarrhea.  Endocrine: Negative.   Genitourinary: Positive for difficulty urinating.  Musculoskeletal: Positive for arthralgias, back pain, myalgias and neck pain.  Skin: Negative.   Allergic/Immunologic: Negative.   Neurological: Negative.   Hematological: Negative.   Psychiatric/Behavioral: Negative.   All other systems reviewed and are negative.      Objective:   Physical Exam General: No acute distress, weight stable HEENT: EOMI, oral membranes moist Cards: reg rate  Chest: normal effort Abdomen: Soft, NT, ND Skin: dry, intact Extremities: no edema Musculoskeletal: Head forward posture. Traps and neck tight.   Low back with generalized tenderness to palpation. Fair range of motion Neurological: She isalertand oriented to person, place, and time.  Skin: Skin iswarmand dry.  Psychiatric: pleasant   Assessment & Plan:  1. Fibromyalgia with myofascial pain: Continue exercise and heat therapy.Maintain physical activity to tolerance.  2. Rotator cuff syndrome on the right>left: continue rotator cuff exercises, and scapular ROM exercises as she's been doing.   3. Cervicalgia. Post-laminectomy syndrome, facet arthropathy: Cervical Radiculopathy: Continue Gabapentin.   -discussed weight  gain with her. May need to make better dietary choices as a whole 4. Depression: Continue Cymbalta. PCP increase dose to 90 mg daily. 5. Mid/low back pain/ Lumbar Spondylosis: right neck and shoulder girdle pain appears largely myofascial.            -refer discussed for formal PT, SHE WOULD LIKE TO WORK ON HER OWN FOR NOW           -  Refilled: Hydrocodone 7.5/325 mg one tablet every 8 hours as needed for moderate pain #80.We will continue the controlled substance monitoring program, this consists of regular clinic visits, examinations, routine drug screening, pill counts as well as use of [place] Controlled Substance Reporting System. [place] today.   6. Lumbar Radiculopathy:Maintain posture and appropriate body mechanics as well as home exercise program 7OA of right hand:   Voltaren gel and heat therapy.    -CONTINUE oral Voltaren 50 mg twice daily with food.  . 8. Migraines: stable  9. Left CTS:S/P EMG on 03/08/2017: S/P Carpal Tunnel Release on 06/14/2017 via . Dr. Apolonio Schneiders.    15 minutes of face to face patient care time was spent during this visit. All questions were encouraged and answered.. Follow up in 2 months with NP

## 2018-05-05 ENCOUNTER — Ambulatory Visit: Payer: Medicaid Other | Admitting: Family Medicine

## 2018-05-05 ENCOUNTER — Other Ambulatory Visit: Payer: Self-pay

## 2018-05-05 ENCOUNTER — Encounter: Payer: Self-pay | Admitting: Family Medicine

## 2018-05-05 VITALS — BP 128/80 | HR 95 | Ht 61.0 in | Wt 149.6 lb

## 2018-05-05 DIAGNOSIS — R350 Frequency of micturition: Secondary | ICD-10-CM | POA: Diagnosis not present

## 2018-05-05 DIAGNOSIS — F339 Major depressive disorder, recurrent, unspecified: Secondary | ICD-10-CM

## 2018-05-05 DIAGNOSIS — M797 Fibromyalgia: Secondary | ICD-10-CM | POA: Diagnosis not present

## 2018-05-05 DIAGNOSIS — G894 Chronic pain syndrome: Secondary | ICD-10-CM | POA: Diagnosis not present

## 2018-05-05 DIAGNOSIS — Z79891 Long term (current) use of opiate analgesic: Secondary | ICD-10-CM

## 2018-05-05 DIAGNOSIS — Z23 Encounter for immunization: Secondary | ICD-10-CM | POA: Diagnosis not present

## 2018-05-05 LAB — POCT URINALYSIS DIPSTICK
GLUCOSE UA: NEGATIVE
Ketones, UA: NEGATIVE
LEUKOCYTES UA: NEGATIVE
Nitrite, UA: NEGATIVE
PH UA: 5 (ref 5.0–8.0)
Protein, UA: NEGATIVE
RBC UA: NEGATIVE
UROBILINOGEN UA: 0.2 U/dL

## 2018-05-05 NOTE — Progress Notes (Signed)
Subjective   CC:  Chief Complaint  Patient presents with  . Urinary Urgency    Burning and urge, pressure in abdomen, Odor smell in urine, requesting flu shot today     HPI: Robin Morgan is a 62 y.o. female who presents to the office today to address the problems listed above in the chief complaint.  Patient reports urinary urgency without incontinence ongoing over the last several months.  She denies dysuria, no vaginal discharge or prolapse symptoms.  No rash.  She has sensation of increased urinary pressure.  She denies fevers flank pain nausea vomiting or gross hematuria.  Symptoms have been present for several hours to days.  She denies history of interstitial cystitis.  She denies vaginal symptoms including vaginal discharge or pelvic pain.   Chronic pain syndrome managed by pain management on twice daily narcotics, Cymbalta, gabapentin.  She reports her back pain persist.  Has trouble sleeping because of pain.  Sleep deprived.  Mood thus is worse.  Feels less motivated.  Staying in her pajamas for several days at a time.  Not as involved as she typically has been in her housework etc.  She denies suicidality.  Review of systems is positive for concerns regarding memory.  Mother died from Alzheimer's disease.  She worries about this.  Assessment  1. Frequent urination   2. Depression, recurrent ([place])   3. Chronic pain syndrome   4. Fibromyalgia   5. Chronic prescription opiate use   6. Need for influenza vaccination      Plan   Possible overactive bladder: Negative dipstick today.  Culture to be certain.  Counseling done.  Hydrate and monitor.  If symptoms worsen, consider prescription medications  Worsening depression: Multifactorial nature due to sleep deprivation, chronic pain syndrome and worry over memory.  Continue current medications see next   Chronic pain and opioid dependence: Pain seems to be worsening.  Consider opioid hypersensitivity syndrome.  Recommend  having the conversation with her pain management doctor, perhaps weaning opioids slowly to see if her pain is improved.  Continue gabapentin.  Discussed memory impairment on chronic opioids.  Recommend follow-up appointment to assess cognitive function.  Influenza vaccination given today  Follow up: Return for as scheduled.  Orders Placed This Encounter  Procedures  . Urine Culture  . Flu Vaccine QUAD 36+ mos IM  . POCT urinalysis dipstick   No orders of the defined types were placed in this encounter.     I reviewed the patients updated PMH, FH, and SocHx.    Patient Active Problem List   Diagnosis Date Noted  . Diabetic peripheral neuropathy associated with type 2 diabetes mellitus ([place]) 01/06/2017    Priority: High  . OSA (obstructive sleep apnea) 03/10/2016    Priority: High  . Controlled diabetes mellitus with diabetic neuropathy, with long-term current use of insulin ([place]) 10/11/2014    Priority: High  . HTN (hypertension) 10/11/2014    Priority: High  . Hyperlipidemia 10/11/2014    Priority: High  . DDD (degenerative disc disease), lumbar 01/15/2014    Priority: High  . Hypothyroidism, acquired 06/12/2013    Priority: High  . Cervical post-laminectomy syndrome 05/10/2013    Priority: High  . Chronic pain associated with significant psychosocial dysfunction 01/03/2010    Priority: High  . Esophageal stricture     Priority: Medium  . Tenosynovitis, wrist 10/07/2017    Priority: Medium  . Trigger thumb of left hand 10/07/2017    Priority: Medium  .  Laryngopharyngeal reflux (LPR) 08/26/2017    Priority: Medium  . Dysphagia 08/26/2017    Priority: Medium  . Left carpal tunnel syndrome 03/08/2017    Priority: Medium  . Fibromyalgia 02/12/2016    Priority: Medium  . Osteopenia 12/01/2012    Priority: Medium  . Chronic diarrhea 07/04/2012    Priority: Medium   Current Meds  Medication Sig  . atorvastatin (LIPITOR) 40 MG tablet Take 1 tablet (40 mg total) by  mouth daily.  Marland Kitchen b complex vitamins tablet Take 1 tablet by mouth daily.  . Calcium Carbonate-Vitamin D (CALCIUM-VITAMIN D) 500-200 MG-UNIT per tablet Take 1 tablet by mouth 2 (two) times daily with a meal.  . co-enzyme Q-10 30 MG capsule Take 30 mg by mouth 3 (three) times daily.  . cyclobenzaprine (FLEXERIL) 10 MG tablet TAKE 1 TABLET (10 MG TOTAL) BY MOUTH EVERY 8 (EIGHT) HOURS AS NEEDED.  Marland Kitchen diclofenac sodium (VOLTAREN) 1 % GEL Apply 2 g topically 4 (four) times daily.  . diphenoxylate-atropine (LOMOTIL) 2.5-0.025 MG tablet Take 1 tablet by mouth 4 (four) times daily as needed for diarrhea or loose stools.  . DULoxetine (CYMBALTA) 30 MG capsule Take 3 capsules (90 mg total) by mouth daily.  Marland Kitchen esomeprazole (NEXIUM) 40 MG capsule Take 1 capsule (40 mg total) by mouth 2 (two) times daily before a meal. Morning and evening  . fenofibrate 160 MG tablet Take 1 tablet (160 mg total) by mouth daily.  Marland Kitchen gabapentin (NEURONTIN) 300 MG capsule TAKE 1 CAP BY MOUTH AS DIRECTED.TAKE 2 CAPSULES IN THE MORNING, 2 CAPSULES AT LUNCH AND 3 AT BEDTIME  . HYDROcodone-acetaminophen (NORCO) 7.5-325 MG tablet Take 1 tablet by mouth every 8 (eight) hours as needed for moderate pain.  Marland Kitchen insulin glargine (LANTUS) 100 UNIT/ML injection Inject 0.6 mLs (60 Units total) into the skin daily.  Marland Kitchen LANTUS SOLOSTAR 100 UNIT/ML Solostar Pen Inject 60 Units into the skin daily at 10 pm.  . levothyroxine (SYNTHROID, LEVOTHROID) 88 MCG tablet Take 1 tablet (88 mcg total) by mouth daily before breakfast.  . lisinopril (PRINIVIL,ZESTRIL) 10 MG tablet Take 1 tablet (10 mg total) by mouth daily.  . magnesium gluconate (MAGONATE) 500 MG tablet Take 1 tablet (500 mg total) by mouth daily.  . metFORMIN (GLUCOPHAGE) 1000 MG tablet Take 1 tablet by mouth 2 (two) times daily.  . Omega-3 Fatty Acids (FISH OIL) 1000 MG CAPS Take 2 capsules by mouth daily.  . ranitidine (ZANTAC) 300 MG tablet Take 300 mg by mouth at bedtime.  . triamcinolone cream  (KENALOG) 0.1 % Apply 1 application topically 2 (two) times daily. As needed  . zolpidem (AMBIEN) 10 MG tablet Take 1 tablet (10 mg total) by mouth at bedtime as needed. for sleep    Review of Systems: Cardiovascular: negative for chest pain Respiratory: negative for SOB or persistent cough Gastrointestinal: negative for abdominal pain Constitutional: Negative for fever malaise or anorexia  Objective  Vitals: BP 128/80   Pulse 95   Ht 5\' 1"  (1.549 m)   Wt 149 lb 9.6 oz (67.9 kg)   SpO2 99%   BMI 28.27 kg/m  General: no acute distress, appears well, not sedated Psych:  Alert and oriented, normal mood and affect Cardiovascular:  RRR without murmur or gallop. no peripheral edema Respiratory:  Good breath sounds bilaterally, CTAB with normal respiratory effort Gastrointestinal: soft, flat abdomen, normal active bowel sounds, no palpable masses, no hepatosplenomegaly, no appreciated hernias, NO CVAT, mild suprapubic ttp w/o rebound or guarding Skin:  Warm, no rashes Neurologic:   Mental status is normal. normal gait Office Visit on 05/05/2018  Component Date Value Ref Range Status  . Color, UA 05/05/2018 dark yellow   Final  . Clarity, UA 05/05/2018 clear   Final  . Glucose, UA 05/05/2018 Negative  Negative Final  . Bilirubin, UA 05/05/2018 1+   Final  . Ketones, UA 05/05/2018 negative   Final  . Spec Grav, UA 05/05/2018 >=1.030* 1.010 - 1.025 Final  . Blood, UA 05/05/2018 negative   Final  . pH, UA 05/05/2018 5.0  5.0 - 8.0 Final  . Protein, UA 05/05/2018 Negative  Negative Final  . Urobilinogen, UA 05/05/2018 0.2  0.2 or 1.0 E.U./dL Final  . Nitrite, UA 05/05/2018 negative   Final  . Leukocytes, UA 05/05/2018 Negative  Negative Final   Lab Results  Component Value Date   HGBA1C 6.7 (A) 02/08/2018     Commons side effects, risks, benefits, and alternatives for medications and treatment plan prescribed today were discussed, and the patient expressed understanding of the given  instructions. Patient is instructed to call or message via MyChart if he/she has any questions or concerns regarding our treatment plan. No barriers to understanding were identified. We discussed Red Flag symptoms and signs in detail. Patient expressed understanding regarding what to do in case of urgent or emergency type symptoms.   Medication list was reconciled, printed and provided to the patient in AVS. Patient instructions and summary information was reviewed with the patient as documented in the AVS. This note was prepared with assistance of Dragon voice recognition software. Occasional wrong-word or sound-a-like substitutions may have occurred due to the inherent limitations of voice recognition software

## 2018-05-05 NOTE — Patient Instructions (Addendum)
Follow up as scheduled for cpe in November.   Please schedule an appointment for memory testing.   Try decreasing the narcotic dose over the next several weeks to see if pain is improved.   Does cymbalta help your pain?  Continue your gabapentin.

## 2018-05-06 LAB — URINE CULTURE
MICRO NUMBER:: 91159140
Result:: NO GROWTH
SPECIMEN QUALITY:: ADEQUATE

## 2018-05-09 ENCOUNTER — Other Ambulatory Visit: Payer: Self-pay | Admitting: Family Medicine

## 2018-05-09 NOTE — Telephone Encounter (Signed)
Lomotil refill Last Refill:03/17/18 # 30  0refill Last OV: 12/31/17 PCP: Dr. Jonni Sanger Pharmacy:[facility], [address]

## 2018-05-09 NOTE — Telephone Encounter (Signed)
Copied from [place] [id number]. Topic: Quick Communication - Rx Refill/Question >> May 09, 2018  4:12 PM Wynetta Emery, [place] C wrote: Medication: diphenoxylate-atropine (LOMOTIL) 2.5-0.025 MG tablet   Has the patient contacted their pharmacy? Yes  (Agent: If no, request that the patient contact the pharmacy for the refill.) (Agent: If yes, when and what did the pharmacy advise?)  Preferred Pharmacy (with phone number or street name): [place], [place] - [place] [phone] (Phone) [phone] (Fax)    Agent: Please be advised that RX refills may take up to 3 business days. We ask that you follow-up with your pharmacy.

## 2018-05-12 MED ORDER — DIPHENOXYLATE-ATROPINE 2.5-0.025 MG PO TABS: 1.0000 | ORAL_TABLET | Freq: Four times a day (QID) | ORAL | 0 refills | Status: DC | PRN

## 2018-05-26 ENCOUNTER — Encounter: Payer: Self-pay | Admitting: Family Medicine

## 2018-05-26 ENCOUNTER — Ambulatory Visit (INDEPENDENT_AMBULATORY_CARE_PROVIDER_SITE_OTHER): Payer: Medicaid Other | Admitting: Family Medicine

## 2018-05-26 ENCOUNTER — Other Ambulatory Visit: Payer: Self-pay

## 2018-05-26 VITALS — BP 110/70 | HR 96 | Ht 61.0 in | Wt 149.6 lb

## 2018-05-26 DIAGNOSIS — R413 Other amnesia: Secondary | ICD-10-CM

## 2018-05-26 MED ORDER — TRIAMCINOLONE ACETONIDE 0.1 % EX CREA: 1.0000 "application " | TOPICAL_CREAM | Freq: Two times a day (BID) | CUTANEOUS | 1 refills | Status: DC

## 2018-05-26 MED ORDER — DIPHENOXYLATE-ATROPINE 2.5-0.025 MG PO TABS: 1.0000 | ORAL_TABLET | Freq: Four times a day (QID) | ORAL | 1 refills | Status: DC | PRN

## 2018-05-26 NOTE — Progress Notes (Signed)
Subjective  CC:  Chief Complaint  Patient presents with  . Follow-up    memory testing    HPI: Robin Morgan is a 62 y.o. female who presents to the office today to address the problems listed above in the chief complaint.  Robin Morgan is here for recheck.  Last visit she got up concerns regarding her memory.  Her mother died from Alzheimer's disease.  This worries her.  She notes that she cannot remember simple things, her daughter is often telling her "I really told her that".  She denies problems with functional activities, driving, getting lost, speech.  She is on chronic narcotics for multiple years due to chronic pain fibromyalgia.  She is on Ambien for sleep.  She only uses that intermittently.  She is also on gabapentin which help her sleep.  She does have a history of insomnia.  Stressors: Takes care of her father with the help of her 2 sisters.  There is some interpersonal relationship problems that worry her. MMSE - Mini Mental State Exam 05/26/2018  Orientation to time 5  Orientation to Place 5  Registration 3  Attention/ Calculation 5  Recall 2  Language- name 2 objects 2  Language- repeat 1  Language- follow 3 step command 3  Language- read & follow direction 1  Write a sentence 1  Copy design 1  Total score 29    Assessment  1. Memory difficulty      Plan   Memory difficulty: Reassured.  Cognitive screening is perfectly normal.  I suspect some of her memory problems are more related to poor attention and stressors.  Also discussed possibility of medication side effects.  Follow up: CPE in November  No orders of the defined types were placed in this encounter.  Meds ordered this encounter  Medications  . DISCONTD: diphenoxylate-atropine (LOMOTIL) 2.5-0.025 MG tablet    Sig: Take 1 tablet by mouth 4 (four) times daily as needed for diarrhea or loose stools.    Dispense:  60 tablet    Refill:  1  . triamcinolone cream (KENALOG) 0.1 %    Sig: Apply 1  application topically 2 (two) times daily. As needed    Dispense:  30 g    Refill:  1  . diphenoxylate-atropine (LOMOTIL) 2.5-0.025 MG tablet    Sig: Take 1 tablet by mouth 4 (four) times daily as needed for diarrhea or loose stools.    Dispense:  60 tablet    Refill:  1      I reviewed the patients updated PMH, FH, and SocHx.    Patient Active Problem List   Diagnosis Date Noted  . Diabetic peripheral neuropathy associated with type 2 diabetes mellitus ([place]) 01/06/2017    Priority: High  . OSA (obstructive sleep apnea) 03/10/2016    Priority: High  . Controlled diabetes mellitus with diabetic neuropathy, with long-term current use of insulin ([place]) 10/11/2014    Priority: High  . HTN (hypertension) 10/11/2014    Priority: High  . Hyperlipidemia 10/11/2014    Priority: High  . DDD (degenerative disc disease), lumbar 01/15/2014    Priority: High  . Hypothyroidism, acquired 06/12/2013    Priority: High  . Cervical post-laminectomy syndrome 05/10/2013    Priority: High  . Chronic pain associated with significant psychosocial dysfunction 01/03/2010    Priority: High  . Esophageal stricture     Priority: Medium  . Tenosynovitis, wrist 10/07/2017    Priority: Medium  . Trigger thumb of left hand  10/07/2017    Priority: Medium  . Laryngopharyngeal reflux (LPR) 08/26/2017    Priority: Medium  . Dysphagia 08/26/2017    Priority: Medium  . Left carpal tunnel syndrome 03/08/2017    Priority: Medium  . Fibromyalgia 02/12/2016    Priority: Medium  . Osteopenia 12/01/2012    Priority: Medium  . Chronic diarrhea 07/04/2012    Priority: Medium   Current Meds  Medication Sig  . atorvastatin (LIPITOR) 40 MG tablet Take 1 tablet (40 mg total) by mouth daily.  Marland Kitchen b complex vitamins tablet Take 1 tablet by mouth daily.  . Calcium Carbonate-Vitamin D (CALCIUM-VITAMIN D) 500-200 MG-UNIT per tablet Take 1 tablet by mouth 2 (two) times daily with a meal.  . co-enzyme Q-10 30 MG capsule  Take 30 mg by mouth 3 (three) times daily.  . cyclobenzaprine (FLEXERIL) 10 MG tablet TAKE 1 TABLET (10 MG TOTAL) BY MOUTH EVERY 8 (EIGHT) HOURS AS NEEDED.  Marland Kitchen diclofenac sodium (VOLTAREN) 1 % GEL Apply 2 g topically 4 (four) times daily.  . diphenoxylate-atropine (LOMOTIL) 2.5-0.025 MG tablet Take 1 tablet by mouth 4 (four) times daily as needed for diarrhea or loose stools.  . DULoxetine (CYMBALTA) 30 MG capsule Take 3 capsules (90 mg total) by mouth daily.  Marland Kitchen esomeprazole (NEXIUM) 40 MG capsule Take 1 capsule (40 mg total) by mouth 2 (two) times daily before a meal. Morning and evening  . fenofibrate 160 MG tablet Take 1 tablet (160 mg total) by mouth daily.  Marland Kitchen gabapentin (NEURONTIN) 300 MG capsule TAKE 1 CAP BY MOUTH AS DIRECTED.TAKE 2 CAPSULES IN THE MORNING, 2 CAPSULES AT LUNCH AND 3 AT BEDTIME  . HYDROcodone-acetaminophen (NORCO) 7.5-325 MG tablet Take 1 tablet by mouth every 8 (eight) hours as needed for moderate pain.  Marland Kitchen insulin glargine (LANTUS) 100 UNIT/ML injection Inject 0.6 mLs (60 Units total) into the skin daily.  Marland Kitchen LANTUS SOLOSTAR 100 UNIT/ML Solostar Pen Inject 60 Units into the skin daily at 10 pm.  . levothyroxine (SYNTHROID, LEVOTHROID) 88 MCG tablet Take 1 tablet (88 mcg total) by mouth daily before breakfast.  . lisinopril (PRINIVIL,ZESTRIL) 10 MG tablet Take 1 tablet (10 mg total) by mouth daily.  . magnesium gluconate (MAGONATE) 500 MG tablet Take 1 tablet (500 mg total) by mouth daily.  . metFORMIN (GLUCOPHAGE) 1000 MG tablet Take 1 tablet by mouth 2 (two) times daily.  . Omega-3 Fatty Acids (FISH OIL) 1000 MG CAPS Take 2 capsules by mouth daily.  . ranitidine (ZANTAC) 300 MG tablet Take 300 mg by mouth at bedtime.  Marland Kitchen zolpidem (AMBIEN) 10 MG tablet Take 1 tablet (10 mg total) by mouth at bedtime as needed. for sleep  . [DISCONTINUED] diphenoxylate-atropine (LOMOTIL) 2.5-0.025 MG tablet Take 1 tablet by mouth 4 (four) times daily as needed for diarrhea or loose stools.  .  [DISCONTINUED] diphenoxylate-atropine (LOMOTIL) 2.5-0.025 MG tablet Take 1 tablet by mouth 4 (four) times daily as needed for diarrhea or loose stools.    Allergies: Patient is allergic to compazine; prochlorperazine; and naproxen. Family History: Patient family history includes Alzheimer's disease in her mother; Breast cancer (age of onset: 51) in her paternal grandmother; Diabetes in her father; Heart disease in her maternal grandmother. Social History:  Patient  reports that she has never smoked. She has never used smokeless tobacco. She reports that she does not drink alcohol or use drugs.  Review of Systems: Constitutional: Negative for fever malaise or anorexia Cardiovascular: negative for chest pain Respiratory: negative for SOB  or persistent cough Gastrointestinal: negative for abdominal pain  Objective  Vitals: BP 110/70   Pulse 96   Ht 5\' 1"  (1.549 m)   Wt 149 lb 9.6 oz (67.9 kg)   SpO2 95%   BMI 28.27 kg/m  General: no acute distress , A&Ox3    Commons side effects, risks, benefits, and alternatives for medications and treatment plan prescribed today were discussed, and the patient expressed understanding of the given instructions. Patient is instructed to call or message via MyChart if he/she has any questions or concerns regarding our treatment plan. No barriers to understanding were identified. We discussed Red Flag symptoms and signs in detail. Patient expressed understanding regarding what to do in case of urgent or emergency type symptoms.   Medication list was reconciled, printed and provided to the patient in AVS. Patient instructions and summary information was reviewed with the patient as documented in the AVS. This note was prepared with assistance of Dragon voice recognition software. Occasional wrong-word or sound-a-like substitutions may have occurred due to the inherent limitations of voice recognition software

## 2018-05-26 NOTE — Patient Instructions (Signed)
See ya next month.   No one can make you feel anyway.   You control that.

## 2018-06-06 ENCOUNTER — Encounter: Payer: Medicaid Other | Admitting: Registered Nurse

## 2018-06-14 ENCOUNTER — Encounter: Payer: Self-pay | Admitting: Registered Nurse

## 2018-06-14 ENCOUNTER — Encounter: Payer: Medicaid Other | Attending: Physical Medicine and Rehabilitation | Admitting: Registered Nurse

## 2018-06-14 VITALS — BP 114/69 | HR 92 | Resp 14 | Wt 151.0 lb

## 2018-06-14 DIAGNOSIS — M75101 Unspecified rotator cuff tear or rupture of right shoulder, not specified as traumatic: Secondary | ICD-10-CM

## 2018-06-14 DIAGNOSIS — M542 Cervicalgia: Secondary | ICD-10-CM

## 2018-06-14 DIAGNOSIS — G8929 Other chronic pain: Secondary | ICD-10-CM

## 2018-06-14 DIAGNOSIS — M5010 Cervical disc disorder with radiculopathy, unspecified cervical region: Secondary | ICD-10-CM

## 2018-06-14 DIAGNOSIS — Z5181 Encounter for therapeutic drug level monitoring: Secondary | ICD-10-CM

## 2018-06-14 DIAGNOSIS — M4716 Other spondylosis with myelopathy, lumbar region: Secondary | ICD-10-CM | POA: Diagnosis not present

## 2018-06-14 DIAGNOSIS — M75102 Unspecified rotator cuff tear or rupture of left shoulder, not specified as traumatic: Secondary | ICD-10-CM

## 2018-06-14 DIAGNOSIS — M797 Fibromyalgia: Secondary | ICD-10-CM

## 2018-06-14 DIAGNOSIS — M546 Pain in thoracic spine: Secondary | ICD-10-CM

## 2018-06-14 DIAGNOSIS — M7918 Myalgia, other site: Secondary | ICD-10-CM | POA: Diagnosis not present

## 2018-06-14 DIAGNOSIS — M961 Postlaminectomy syndrome, not elsewhere classified: Secondary | ICD-10-CM

## 2018-06-14 DIAGNOSIS — M609 Myositis, unspecified: Secondary | ICD-10-CM | POA: Diagnosis present

## 2018-06-14 DIAGNOSIS — Z79899 Other long term (current) drug therapy: Secondary | ICD-10-CM

## 2018-06-14 DIAGNOSIS — G894 Chronic pain syndrome: Secondary | ICD-10-CM

## 2018-06-14 DIAGNOSIS — F329 Major depressive disorder, single episode, unspecified: Secondary | ICD-10-CM

## 2018-06-14 MED ORDER — HYDROCODONE-ACETAMINOPHEN 7.5-325 MG PO TABS: 1.0000 | ORAL_TABLET | Freq: Three times a day (TID) | ORAL | 0 refills | Status: DC | PRN

## 2018-06-14 NOTE — Progress Notes (Signed)
Subjective:    Patient ID: Robin Morgan, female    DOB: [birth year], 62 y.o.   MRN: [id number]  HPI: [name] is a 62 year old female who returns for follow up appointment for chronic pain and medication refill. She states her pain is located in her neck radiating into er bilateral shoulders and mid- lower back.  Also reports increase frequency and intensity of headaches and lower back pain. Also states when she was on Fentanyl her headaches was controlled and her lower back pain. That was greater than 6 years ago, PMP Aware was reviewed. She has been on Hydrocodone 7.5/325 mg since 02/28/2017. Will discuss with Dr. Naaman Plummer and give her a call she verbalizes understanding. She rates her pain 8. Her current exercise regime is walking.   [name] is 22.22 MME. Last UDS was Performed on 01/31/2018, it was consistent.    Pain Inventory Average Pain 8 Pain Right Now 8 My pain is constant, burning and aching  In the last 24 hours, has pain interfered with the following? General activity 8 Relation with others 9 Enjoyment of life 8 What TIME of day is your pain at its worst? all Sleep (in general) Fair  Pain is worse with: bending and sitting Pain improves with: medication Relief from Meds: 8  Mobility walk without assistance ability to climb steps?  yes do you drive?  yes  Function disabled: date disabled .  Neuro/Psych bladder control problems bowel control problems spasms  Prior Studies Any changes since last visit?  no  Physicians involved in your care Any changes since last visit?  no   Family History  Problem Relation Age of Onset  . Alzheimer's disease Mother   . Diabetes Father   . Heart disease Maternal Grandmother   . Breast cancer Paternal Grandmother 39   Social History   Socioeconomic History  . Marital status: Divorced    Spouse name: Not on file  . Number of children: 1  . Years of education: Not on file  .  Highest education level: Not on file  Occupational History  . Occupation: disabled  Social Needs  . Financial resource strain: Not on file  . Food insecurity:    Worry: Not on file    Inability: Not on file  . Transportation needs:    Medical: Not on file    Non-medical: Not on file  Tobacco Use  . Smoking status: Never Smoker  . Smokeless tobacco: Never Used  Substance and Sexual Activity  . Alcohol use: No  . Drug use: No  . Sexual activity: Not Currently  Lifestyle  . Physical activity:    Days per week: Not on file    Minutes per session: Not on file  . Stress: Not on file  Relationships  . Social connections:    Talks on phone: Not on file    Gets together: Not on file    Attends religious service: Not on file    Active member of club or organization: Not on file    Attends meetings of clubs or organizations: Not on file    Relationship status: Not on file  Other Topics Concern  . Not on file  Social History Narrative  . Not on file   Past Surgical History:  Procedure Laterality Date  . ABDOMINAL HYSTERECTOMY     1986  . APPENDECTOMY     1983  . BREAST EXCISIONAL BIOPSY Right 1990's  . BREAST SURGERY  rt breast cyst done in the 90's  . CHOLECYSTECTOMY    . GALLBLADDER SURGERY     1983  . ROTATOR CUFF REPAIR     right  . SPINE SURGERY    . TONSILLECTOMY     1973   Past Medical History:  Diagnosis Date  . Calcific tendonitis   . Cervical spondylosis without myelopathy   . Depression   . Diabetes mellitus   . Esophageal stricture   . Fibromyalgia   . GERD (gastroesophageal reflux disease)   . Hiatal hernia   . Hyperlipidemia   . Hypertension    BP 114/69   Pulse 92   Resp 14   Wt 151 lb (68.5 kg)   SpO2 96%   BMI 28.53 kg/m   Opioid Risk Score:   Fall Risk Score:  `1  Depression screen PHQ 2/9  Depression screen [facility] 2/9 03/07/2018 02/08/2018 12/07/2017 11/09/2017 10/27/2017 07/29/2017 06/03/2017  Decreased Interest 1 0 0 0 3 3 1   Down,  Depressed, Hopeless 1 0 0 0 3 3 1   PHQ - 2 Score 2 0 0 0 6 6 2   Altered sleeping - 0 - 0 - - -  Tired, decreased energy - 0 - 0 - - -  Change in appetite - 0 - 0 - - -  Feeling bad or failure about yourself  - 0 - 0 - - -  Trouble concentrating - 0 - 0 - - -  Moving slowly or fidgety/restless - 0 - 0 - - -  Suicidal thoughts - 0 - 0 - - -  PHQ-9 Score - 0 - 0 - - -  Difficult doing work/chores - Not difficult at all - - - - -    Review of Systems  Constitutional: Positive for unexpected weight change.  HENT: Negative.   Eyes: Negative.   Respiratory: Positive for apnea.   Cardiovascular: Negative.   Gastrointestinal: Positive for diarrhea.  Endocrine:       High blood sugar Low blood sugar  Genitourinary: Negative.   Musculoskeletal: Positive for arthralgias and back pain.       Spasms   Skin: Negative.   Allergic/Immunologic: Negative.   Neurological: Negative.   Hematological: Negative.   Psychiatric/Behavioral: Negative.   All other systems reviewed and are negative.      Objective:   Physical Exam  Constitutional: She is oriented to person, place, and time. She appears well-developed and well-nourished.  HENT:  Head: Normocephalic and atraumatic.  Neck: Normal range of motion. Neck supple.  Cardiovascular: Normal rate and regular rhythm.  Pulmonary/Chest: Effort normal and breath sounds normal.  Musculoskeletal:  Normal Muscle Bulk and Muscle Testing Reveals: Upper Extremities: Full ROM and Muscle Strength 5/5 Thoracic Paraspinal Tenderness: T-7-T-9 Lower Extremities: Full ROM and Muscle Strength 5/5 Arises from Table with ease Normal Based Gait  Neurological: She is alert and oriented to person, place, and time.  Skin: Skin is warm and dry.  Psychiatric: She has a normal mood and affect. Her behavior is normal.  Nursing note and vitals reviewed.         Assessment & Plan:  1. Fibromyalgia with myofascial pain: Continue exercise and heat therapy.  06/14/2018. Continuecurrent medication regimen withFlexeril. 2. Rotator cuff syndrome on the right: Continue with stretching exercises and heat therapy. 06/14/2018 3. Cervicalgia. Post-laminectomy syndrome, facet arthropathy: Cervical Radiculopathy: Continue current medication regimen withGabapentin. 06/14/2018 S/ P EMG with Dr. Naaman Plummer on 03/08/2017: Diagnosed with Moderate to Severe CTS of Left wrist:06/14/2018  4. Depression: Continuecurrent medication regimen withCymbalta. PCP increase dose to 90 mg daily. 06/14/2018 5. Mid/low back pain/ Lumbar Spondylosis: Continue Current Medication and stretching and heat therapy. 06/14/2018 Refilled: Hydrocodone 7.5/325 mg one tablet every 8 hours as needed for moderate pain #70. 12/27/2017 6. Lumbar Radiculopathy:No complaints today.Continuecurrent medication regimen withGabapentin. 06/14/2018 7OA of right hand: Continue using Voltaren gel/ May substitute with Voltaren Tablet, she realizes she can't use both and verbalizes understanding. Continue withheat therapy. 06/14/2018 8. Migraines: No complaints today. Continue to Monitor 06/14/2018 9. Left CTS:S/P EMG on 03/08/2017: S/P Carpal Tunnel Release on 06/14/2017 via . Dr. Apolonio Schneiders. 06/14/2018 10. Muscle Spasm: Continue Flexeril. Continue to Monitor. 06/14/2018  20 minutes of face to face patient care time was spent during this visit. All questions were encouraged and answered.   F/U in 68month

## 2018-06-15 ENCOUNTER — Telehealth: Payer: Self-pay | Admitting: Registered Nurse

## 2018-06-15 NOTE — Telephone Encounter (Signed)
Spoke with Dr. Naaman Plummer regarding Robin Morgan concerns as it relates to her Migraine and Fentanyl. She will keep a headache journal and call or send message via My-chart with information on 06/27/2018. Then we will discuss various treatment modalities she verbalizes understanding.

## 2018-06-16 ENCOUNTER — Ambulatory Visit (INDEPENDENT_AMBULATORY_CARE_PROVIDER_SITE_OTHER): Payer: Medicaid Other | Admitting: Family Medicine

## 2018-06-16 ENCOUNTER — Other Ambulatory Visit: Payer: Self-pay

## 2018-06-16 ENCOUNTER — Encounter: Payer: Self-pay | Admitting: Family Medicine

## 2018-06-16 VITALS — BP 122/66 | HR 86 | Temp 98.0°F | Ht 61.0 in | Wt 152.0 lb

## 2018-06-16 DIAGNOSIS — E782 Mixed hyperlipidemia: Secondary | ICD-10-CM

## 2018-06-16 DIAGNOSIS — G894 Chronic pain syndrome: Secondary | ICD-10-CM | POA: Diagnosis not present

## 2018-06-16 DIAGNOSIS — Z794 Long term (current) use of insulin: Secondary | ICD-10-CM

## 2018-06-16 DIAGNOSIS — E039 Hypothyroidism, unspecified: Secondary | ICD-10-CM

## 2018-06-16 DIAGNOSIS — I10 Essential (primary) hypertension: Secondary | ICD-10-CM | POA: Diagnosis not present

## 2018-06-16 DIAGNOSIS — Z0001 Encounter for general adult medical examination with abnormal findings: Secondary | ICD-10-CM | POA: Diagnosis not present

## 2018-06-16 DIAGNOSIS — E1140 Type 2 diabetes mellitus with diabetic neuropathy, unspecified: Secondary | ICD-10-CM | POA: Diagnosis not present

## 2018-06-16 LAB — CBC WITH DIFFERENTIAL/PLATELET
Basophils Absolute: 0 10*3/uL (ref 0.0–0.1)
Basophils Relative: 0.4 % (ref 0.0–3.0)
EOS PCT: 3.1 % (ref 0.0–5.0)
Eosinophils Absolute: 0.3 10*3/uL (ref 0.0–0.7)
HCT: 39 % (ref 36.0–46.0)
Hemoglobin: 12.6 g/dL (ref 12.0–15.0)
LYMPHS ABS: 2.8 10*3/uL (ref 0.7–4.0)
Lymphocytes Relative: 35.1 % (ref 12.0–46.0)
MCHC: 32.2 g/dL (ref 30.0–36.0)
MCV: 84.6 fl (ref 78.0–100.0)
MONOS PCT: 7.6 % (ref 3.0–12.0)
Monocytes Absolute: 0.6 10*3/uL (ref 0.1–1.0)
NEUTROS PCT: 53.8 % (ref 43.0–77.0)
Neutro Abs: 4.4 10*3/uL (ref 1.4–7.7)
PLATELETS: 351 10*3/uL (ref 150.0–400.0)
RBC: 4.61 Mil/uL (ref 3.87–5.11)
RDW: 14.6 % (ref 11.5–15.5)
WBC: 8.1 10*3/uL (ref 4.0–10.5)

## 2018-06-16 LAB — COMPREHENSIVE METABOLIC PANEL
ALT: 24 U/L (ref 0–35)
AST: 30 U/L (ref 0–37)
Albumin: 4.6 g/dL (ref 3.5–5.2)
Alkaline Phosphatase: 64 U/L (ref 39–117)
BILIRUBIN TOTAL: 0.4 mg/dL (ref 0.2–1.2)
BUN: 17 mg/dL (ref 6–23)
CALCIUM: 9.6 mg/dL (ref 8.4–10.5)
CHLORIDE: 104 meq/L (ref 96–112)
CO2: 29 mEq/L (ref 19–32)
CREATININE: 0.88 mg/dL (ref 0.40–1.20)
GFR: 68.59 mL/min (ref >60.00)
Glucose, Bld: 100 mg/dL — ABNORMAL HIGH (ref 70–99)
Potassium: 4.1 mEq/L (ref 3.5–5.1)
Sodium: 140 mEq/L (ref 135–145)
Total Protein: 7.2 g/dL (ref 6.0–8.3)

## 2018-06-16 LAB — MICROALBUMIN / CREATININE URINE RATIO
Creatinine,U: 138.1 mg/dL
MICROALB UR: 1.8 mg/dL (ref 0.0–1.9)
Microalb Creat Ratio: 1.3 mg/g (ref 0.0–30.0)

## 2018-06-16 LAB — POCT GLYCOSYLATED HEMOGLOBIN (HGB A1C): HEMOGLOBIN A1C: 7 % — AB (ref 4.0–5.6)

## 2018-06-16 LAB — TSH: TSH: 1.14 u[IU]/mL (ref 0.35–4.50)

## 2018-06-16 LAB — LIPID PANEL
Cholesterol: 132 mg/dL (ref 0–200)
HDL: 39.9 mg/dL (ref >39.00)
LDL Cholesterol: 68 mg/dL (ref 0–99)
NonHDL: 92.17
TRIGLYCERIDES: 120 mg/dL (ref 0.0–149.0)
Total CHOL/HDL Ratio: 3
VLDL: 24 mg/dL (ref 0.0–40.0)

## 2018-06-16 MED ORDER — LANTUS SOLOSTAR 100 UNIT/ML ~~LOC~~ SOPN: 64.0000 [IU] | PEN_INJECTOR | Freq: Every day | SUBCUTANEOUS | 11 refills | Status: DC

## 2018-06-16 NOTE — Patient Instructions (Addendum)
Please return in 3 months for diabetes and hypertensin follow up   If you have any questions or concerns, please don't hesitate to send me a message via MyChart or call the office at [phone]. Thank you for visiting with [place] today! It's our pleasure caring for you.   Health Maintenance, Female Adopting a healthy lifestyle and getting preventive care can go a long way to promote health and wellness. Talk with your health care provider about what schedule of regular examinations is right for you. This is a good chance for you to check in with your provider about disease prevention and staying healthy. In between checkups, there are plenty of things you can do on your own. Experts have done a lot of research about which lifestyle changes and preventive measures are most likely to keep you healthy. Ask your health care provider for more information. Weight and diet Eat a healthy diet  Be sure to include plenty of vegetables, fruits, low-fat dairy products, and lean protein.  Do not eat a lot of foods high in solid fats, added sugars, or salt.  Get regular exercise. This is one of the most important things you can do for your health. ? Most adults should exercise for at least 150 minutes each week. The exercise should increase your heart rate and make you sweat (moderate-intensity exercise). ? Most adults should also do strengthening exercises at least twice a week. This is in addition to the moderate-intensity exercise.  Maintain a healthy weight  Body mass index (BMI) is a measurement that can be used to identify possible weight problems. It estimates body fat based on height and weight. Your health care provider can help determine your BMI and help you achieve or maintain a healthy weight.  For females 46 years of age and older: ? A BMI below 18.5 is considered underweight. ? A BMI of 18.5 to 24.9 is normal. ? A BMI of 25 to 29.9 is considered overweight. ? A BMI of 30 and above is  considered obese.  Watch levels of cholesterol and blood lipids  You should start having your blood tested for lipids and cholesterol at 64 years of age, then have this test every 5 years.  You may need to have your cholesterol levels checked more often if: ? Your lipid or cholesterol levels are high. ? You are older than 65 years of age. ? You are at high risk for heart disease.  Cancer screening Lung Cancer  Lung cancer screening is recommended for adults 7-48 years old who are at high risk for lung cancer because of a history of smoking.  A yearly low-dose CT scan of the lungs is recommended for people who: ? Currently smoke. ? Have quit within the past 15 years. ? Have at least a 30-pack-year history of smoking. A pack year is smoking an average of one pack of cigarettes a day for 1 year.  Yearly screening should continue until it has been 15 years since you quit.  Yearly screening should stop if you develop a health problem that would prevent you from having lung cancer treatment.  Breast Cancer  Practice breast self-awareness. This means understanding how your breasts normally appear and feel.  It also means doing regular breast self-exams. Let your health care provider know about any changes, no matter how small.  If you are in your 20s or 30s, you should have a clinical breast exam (CBE) by a health care provider every 1-3 years as part of  a regular health exam.  If you are 19 or older, have a CBE every year. Also consider having a breast X-ray (mammogram) every year.  If you have a family history of breast cancer, talk to your health care provider about genetic screening.  If you are at high risk for breast cancer, talk to your health care provider about having an MRI and a mammogram every year.  Breast cancer gene (BRCA) assessment is recommended for women who have family members with BRCA-related cancers. BRCA-related cancers  include: ? Breast. ? Ovarian. ? Tubal. ? Peritoneal cancers.  Results of the assessment will determine the need for genetic counseling and BRCA1 and BRCA2 testing.  Cervical Cancer Your health care provider may recommend that you be screened regularly for cancer of the pelvic organs (ovaries, uterus, and vagina). This screening involves a pelvic examination, including checking for microscopic changes to the surface of your cervix (Pap test). You may be encouraged to have this screening done every 3 years, beginning at age 34.  For women ages 4-65, health care providers may recommend pelvic exams and Pap testing every 3 years, or they may recommend the Pap and pelvic exam, combined with testing for human papilloma virus (HPV), every 5 years. Some types of HPV increase your risk of cervical cancer. Testing for HPV may also be done on women of any age with unclear Pap test results.  Other health care providers may not recommend any screening for nonpregnant women who are considered low risk for pelvic cancer and who do not have symptoms. Ask your health care provider if a screening pelvic exam is right for you.  If you have had past treatment for cervical cancer or a condition that could lead to cancer, you need Pap tests and screening for cancer for at least 20 years after your treatment. If Pap tests have been discontinued, your risk factors (such as having a new sexual partner) need to be reassessed to determine if screening should resume. Some women have medical problems that increase the chance of getting cervical cancer. In these cases, your health care provider may recommend more frequent screening and Pap tests.  Colorectal Cancer  This type of cancer can be detected and often prevented.  Routine colorectal cancer screening usually begins at 64 years of age and continues through 64 years of age.  Your health care provider may recommend screening at an earlier age if you have risk factors  for colon cancer.  Your health care provider may also recommend using home test kits to check for hidden blood in the stool.  A small camera at the end of a tube can be used to examine your colon directly (sigmoidoscopy or colonoscopy). This is done to check for the earliest forms of colorectal cancer.  Routine screening usually begins at age 37.  Direct examination of the colon should be repeated every 5-10 years through 64 years of age. However, you may need to be screened more often if early forms of precancerous polyps or small growths are found.  Skin Cancer  Check your skin from head to toe regularly.  Tell your health care provider about any new moles or changes in moles, especially if there is a change in a mole's shape or color.  Also tell your health care provider if you have a mole that is larger than the size of a pencil eraser.  Always use sunscreen. Apply sunscreen liberally and repeatedly throughout the day.  Protect yourself by wearing long sleeves,  pants, a wide-brimmed hat, and sunglasses whenever you are outside.  Heart disease, diabetes, and high blood pressure  High blood pressure causes heart disease and increases the risk of stroke. High blood pressure is more likely to develop in: ? People who have blood pressure in the high end of the normal range (130-139/85-89 mm Hg). ? People who are overweight or obese. ? People who are African American.  If you are 18-39 years of age, have your blood pressure checked every 3-5 years. If you are 40 years of age or older, have your blood pressure checked every year. You should have your blood pressure measured twice-once when you are at a hospital or clinic, and once when you are not at a hospital or clinic. Record the average of the two measurements. To check your blood pressure when you are not at a hospital or clinic, you can use: ? An automated blood pressure machine at a pharmacy. ? A home blood pressure monitor.  If  you are between 55 years and 79 years old, ask your health care provider if you should take aspirin to prevent strokes.  Have regular diabetes screenings. This involves taking a blood sample to check your fasting blood sugar level. ? If you are at a normal weight and have a low risk for diabetes, have this test once every three years after 64 years of age. ? If you are overweight and have a high risk for diabetes, consider being tested at a younger age or more often. Preventing infection Hepatitis B  If you have a higher risk for hepatitis B, you should be screened for this virus. You are considered at high risk for hepatitis B if: ? You were born in a country where hepatitis B is common. Ask your health care provider which countries are considered high risk. ? Your parents were born in a high-risk country, and you have not been immunized against hepatitis B (hepatitis B vaccine). ? You have HIV or AIDS. ? You use needles to inject street drugs. ? You live with someone who has hepatitis B. ? You have had sex with someone who has hepatitis B. ? You get hemodialysis treatment. ? You take certain medicines for conditions, including cancer, organ transplantation, and autoimmune conditions.  Hepatitis C  Blood testing is recommended for: ? Everyone born from 1945 through 1965. ? Anyone with known risk factors for hepatitis C.  Sexually transmitted infections (STIs)  You should be screened for sexually transmitted infections (STIs) including gonorrhea and chlamydia if: ? You are sexually active and are younger than 64 years of age. ? You are older than 64 years of age and your health care provider tells you that you are at risk for this type of infection. ? Your sexual activity has changed since you were last screened and you are at an increased risk for chlamydia or gonorrhea. Ask your health care provider if you are at risk.  If you do not have HIV, but are at risk, it may be recommended  that you take a prescription medicine daily to prevent HIV infection. This is called pre-exposure prophylaxis (PrEP). You are considered at risk if: ? You are sexually active and do not regularly use condoms or know the HIV status of your partner(s). ? You take drugs by injection. ? You are sexually active with a partner who has HIV.  Talk with your health care provider about whether you are at high risk of being infected with HIV. If you   choose to begin PrEP, you should first be tested for HIV. You should then be tested every 3 months for as long as you are taking PrEP. Pregnancy  If you are premenopausal and you may become pregnant, ask your health care provider about preconception counseling.  If you may become pregnant, take 400 to 800 micrograms (mcg) of folic acid every day.  If you want to prevent pregnancy, talk to your health care provider about birth control (contraception). Osteoporosis and menopause  Osteoporosis is a disease in which the bones lose minerals and strength with aging. This can result in serious bone fractures. Your risk for osteoporosis can be identified using a bone density scan.  If you are 65 years of age or older, or if you are at risk for osteoporosis and fractures, ask your health care provider if you should be screened.  Ask your health care provider whether you should take a calcium or vitamin D supplement to lower your risk for osteoporosis.  Menopause may have certain physical symptoms and risks.  Hormone replacement therapy may reduce some of these symptoms and risks. Talk to your health care provider about whether hormone replacement therapy is right for you. Follow these instructions at home:  Schedule regular health, dental, and eye exams.  Stay current with your immunizations.  Do not use any tobacco products including cigarettes, chewing tobacco, or electronic cigarettes.  If you are pregnant, do not drink alcohol.  If you are  breastfeeding, limit how much and how often you drink alcohol.  Limit alcohol intake to no more than 1 drink per day for nonpregnant women. One drink equals 12 ounces of beer, 5 ounces of wine, or 1 ounces of hard liquor.  Do not use street drugs.  Do not share needles.  Ask your health care provider for help if you need support or information about quitting drugs.  Tell your health care provider if you often feel depressed.  Tell your health care provider if you have ever been abused or do not feel safe at home. This information is not intended to replace advice given to you by your health care provider. Make sure you discuss any questions you have with your health care provider. Document Released: 02/09/2011 Document Revised: 01/02/2016 Document Reviewed: 04/30/2015 Elsevier Interactive Patient Education  [employer].

## 2018-06-16 NOTE — Progress Notes (Signed)
Subjective  Chief Complaint  Patient presents with  . Annual Exam    doing well, states she is having alot of headaches    HPI: Robin Morgan is a 62 y.o. female who presents to [place] at [facility] today for a Female Wellness Visit. She also has the concerns and/or needs as listed above in the chief complaint. These will be addressed in addition to the Health Maintenance Visit.   Wellness Visit: annual visit with health maintenance review and exam without Pap   Health maintenance: Mammogram, colon cancer screening both up-to-date.  Immunizations are up-to-date.  Trying to eat a healthy diet but it is expensive.  Continue to struggle with chronic pain.  Reviewed recent notes from pain management office.  Headaches are persistent problem.  They are chronic in nature and are unchanged Chronic disease f/u and/or acute problem visit: (deemed necessary to be done in addition to the wellness visit):  Diabetes follow-up: Fasting sugars run around 120 consistently.  However she has been eating a little bit worse due to financial constraints, fasting sugars have been up to 140 at times.  Denies symptoms of hyperglycemia.  Denies peripheral neuropathy symptoms or foot concerns.  Eye exam is up-to-date.  Hypertension has been well controlled  Hypothyroidism, due for recheck.  Energy levels are stable.  Takes medicines as prescribed compliance is good.  Assessment  1. Encounter for general adult medical examination with abnormal findings   2. Chronic pain associated with significant psychosocial dysfunction   3. Controlled type 2 diabetes mellitus with diabetic neuropathy, with long-term current use of insulin ([place])   4. Essential hypertension   5. Hypothyroidism, acquired   6. Mixed hyperlipidemia      Plan  Female Wellness Visit:  Age appropriate Health Maintenance and Prevention measures were discussed with patient. Included topics are cancer screening  recommendations, ways to keep healthy (see AVS) including dietary and exercise recommendations, regular eye and dental care, use of seat belts, and avoidance of moderate alcohol use and tobacco use.   BMI: discussed patient's BMI and encouraged positive lifestyle modifications to help get to or maintain a target BMI.  HM needs and immunizations were addressed and ordered. See below for orders. See HM and immunization section for updates.  Routine labs and screening tests ordered including cmp, cbc and lipids where appropriate.  Discussed recommendations regarding Vit D and calcium supplementation (see AVS)  Chronic disease management visit and/or acute problem visit:  Diabetic control is mildly worse.  Recommend improving diet and will increase Lantus to 64 units nightly.  No other medication changes made.  Hypertension: Is well controlled.  Recheck renal and electrolytes  Hyperlipidemia and hypothyroidism follow-up: Recheck levels today.  Compliant with medications without adverse effects.  Chronic pain per PMR  Follow up: Return in 3 months (on 09/16/2018) for diabetes and hypertension f/u.  Orders Placed This Encounter  Procedures  . CBC with Differential  . Comprehensive metabolic panel  . Lipid panel  . TSH  . Microalbumin / creatinine urine ratio  . POCT glycosylated hemoglobin (Hb A1C)   Meds ordered this encounter  Medications  . LANTUS SOLOSTAR 100 UNIT/ML Solostar Pen    Sig: Inject 64 Units into the skin daily at 10 pm.    Dispense:  5 pen    Refill:  11      Lifestyle: Body mass index is 28.72 kg/m. Wt Readings from Last 3 Encounters:  06/16/18 152 lb (68.9 kg)  06/14/18 151  lb (68.5 kg)  05/26/18 149 lb 9.6 oz (67.9 kg)     Patient Active Problem List   Diagnosis Date Noted  . Diabetic peripheral neuropathy associated with type 2 diabetes mellitus ([place]) 01/06/2017    Priority: High  . OSA (obstructive sleep apnea) 03/10/2016    Priority: High  .  Controlled diabetes mellitus with diabetic neuropathy, with long-term current use of insulin ([place]) 10/11/2014    Priority: High  . HTN (hypertension) 10/11/2014    Priority: High  . Hyperlipidemia 10/11/2014    Priority: High  . DDD (degenerative disc disease), lumbar 01/15/2014    Priority: High  . Hypothyroidism, acquired 06/12/2013    Priority: High  . Cervical post-laminectomy syndrome 05/10/2013    Priority: High  . Chronic pain associated with significant psychosocial dysfunction 01/03/2010    Priority: High    Overview:  Chronic Pain Syndrome, Dr. Earl Lites  Overview:  Overview:  Overview:  Chronic Pain Syndrome, Dr. Earl Lites   . Esophageal stricture     Priority: Medium  . Tenosynovitis, wrist 10/07/2017    Priority: Medium  . Trigger thumb of left hand 10/07/2017    Priority: Medium  . Laryngopharyngeal reflux (LPR) 08/26/2017    Priority: Medium  . Dysphagia 08/26/2017    Priority: Medium  . Left carpal tunnel syndrome 03/08/2017    Priority: Medium  . Fibromyalgia 02/12/2016    Priority: Medium  . Osteopenia 12/01/2012    Priority: Medium    10/2017: osteopenia: T = -1.9; stable. Recheck 2 years T = -1.2 at hip, -1.8 at spine, 4/ 2014   . Chronic diarrhea 07/04/2012    Priority: Medium    Overview:  ? Related to gallbladder surgery. Has been evaluated by Dr. Amedeo Plenty [facility]. Lomotil as needed.    Health Maintenance  Topic Date Due  . HEMOGLOBIN A1C  08/11/2018  . MAMMOGRAM  01/12/2019  . FOOT EXAM  02/09/2019  . OPHTHALMOLOGY EXAM  05/11/2019  . DEXA SCAN  11/04/2019  . TETANUS/TDAP  05/11/2022  . COLONOSCOPY  11/19/2025  . INFLUENZA VACCINE  Completed  . Hepatitis C Screening  Completed  . HIV Screening  Discontinued   Immunization History  Administered Date(s) Administered  . Influenza Split 06/04/2009, 05/02/2012  . Influenza, Quadrivalent, Recombinant, Inj, Pf 05/12/2016, 07/07/2017  . Influenza, Seasonal, Injecte, Preservative Fre  06/06/2014, 04/24/2015  . Influenza,inj,Quad PF,6+ Mos 05/05/2018  . Influenza-Unspecified 09/03/2011, 04/19/2013  . Pneumococcal Polysaccharide-23 08/29/2009  . Tdap 05/11/2012   We updated and reviewed the patient's past history in detail and it is documented below. Allergies: Patient is allergic to compazine; prochlorperazine; and naproxen. Past Medical History Patient  has a past medical history of Calcific tendonitis, Cervical spondylosis without myelopathy, Depression, Diabetes mellitus, Esophageal stricture, Fibromyalgia, GERD (gastroesophageal reflux disease), Hiatal hernia, Hyperlipidemia, and Hypertension. Past Surgical History Patient  has a past surgical history that includes Spine surgery; Cholecystectomy; Tonsillectomy; Rotator cuff repair; Breast excisional biopsy (Right, 1990's); Gallbladder surgery; Breast surgery; Appendectomy; and Abdominal hysterectomy. Family History: Patient family history includes Alzheimer's disease in her mother; Breast cancer (age of onset: 5) in her paternal grandmother; Diabetes in her father; Heart disease in her maternal grandmother. Social History:  Patient  reports that she has never smoked. She has never used smokeless tobacco. She reports that she does not drink alcohol or use drugs.  Review of Systems: Constitutional: negative for fever or malaise Ophthalmic: negative for photophobia, double vision or loss of vision Cardiovascular: negative for chest pain, dyspnea on exertion,  or new LE swelling Respiratory: negative for SOB or persistent cough Gastrointestinal: negative for abdominal pain, change in bowel habits or melena Genitourinary: negative for dysuria or gross hematuria, no abnormal uterine bleeding or disharge Musculoskeletal: negative for new gait disturbance or muscular weakness Integumentary: negative for new or persistent rashes, no breast lumps Neurological: negative for TIA or stroke symptoms Psychiatric: negative for SI  or delusions Allergic/Immunologic: negative for hives  Patient Care Team    Relationship Specialty Notifications Start End  Leamon Arnt, MD PCP - General Family Medicine  11/09/17   Iran Planas, MD Consulting Physician Orthopedic Surgery  11/09/17   Meredith Staggers, MD Consulting Physician Physical Medicine and Rehabilitation  11/09/17   Jolene Provost, PA-C Physician Assistant Otolaryngology  11/09/17   Dohmeier, Asencion Partridge, MD Consulting Physician Neurology  11/09/17     Objective  Vitals: BP 122/66   Pulse 86   Temp 98 F (36.7 C)   Ht 5\' 1"  (1.549 m)   Wt 152 lb (68.9 kg)   SpO2 96%   BMI 28.72 kg/m  General:  Well developed, well nourished, no acute distress  Psych:  Alert and orientedx3,normal mood and affect HEENT:  Normocephalic, atraumatic, non-icteric sclera, PERRL, oropharynx is clear without mass or exudate, supple neck without adenopathy, mass or thyromegaly Cardiovascular:  Normal S1, S2, RRR without gallop, rub or murmur, nondisplaced PMI Respiratory:  Good breath sounds bilaterally, CTAB with normal respiratory effort Gastrointestinal: normal bowel sounds, soft, non-tender, no noted masses. No HSM MSK: no deformities, contusions. Joints are without erythema or swelling. Spine and CVA region are nontender Skin:  Warm, no rashes or suspicious lesions noted Neurologic:    Mental status is normal. CN 2-11 are normal. Gross motor and sensory exams are normal. Normal gait. No tremor Breast Exam: No mass, skin retraction or nipple discharge is appreciated in either breast. No axillary adenopathy. Fibrocystic changes are not noted Office Visit on 06/16/2018  Component Date Value Ref Range Status  . Hemoglobin A1C 06/16/2018 7.0* 4.0 - 5.6 % Final     Commons side effects, risks, benefits, and alternatives for medications and treatment plan prescribed today were discussed, and the patient expressed understanding of the given instructions. Patient is instructed to call or  message via MyChart if he/she has any questions or concerns regarding our treatment plan. No barriers to understanding were identified. We discussed Red Flag symptoms and signs in detail. Patient expressed understanding regarding what to do in case of urgent or emergency type symptoms.   Medication list was reconciled, printed and provided to the patient in AVS. Patient instructions and summary information was reviewed with the patient as documented in the AVS. This note was prepared with assistance of Dragon voice recognition software. Occasional wrong-word or sound-a-like substitutions may have occurred due to the inherent limitations of voice recognition software

## 2018-06-30 ENCOUNTER — Telehealth: Payer: Self-pay | Admitting: *Deleted

## 2018-06-30 NOTE — Telephone Encounter (Signed)
Prior authorization submitted to [place] for hydrocodone

## 2018-07-14 ENCOUNTER — Encounter: Payer: Medicaid Other | Attending: Physical Medicine and Rehabilitation | Admitting: Registered Nurse

## 2018-07-14 ENCOUNTER — Encounter: Payer: Self-pay | Admitting: Registered Nurse

## 2018-07-14 VITALS — BP 100/55 | HR 96 | Ht 61.5 in | Wt 151.0 lb

## 2018-07-14 DIAGNOSIS — M545 Low back pain, unspecified: Secondary | ICD-10-CM

## 2018-07-14 DIAGNOSIS — M609 Myositis, unspecified: Secondary | ICD-10-CM | POA: Insufficient documentation

## 2018-07-14 DIAGNOSIS — M546 Pain in thoracic spine: Secondary | ICD-10-CM

## 2018-07-14 DIAGNOSIS — M5010 Cervical disc disorder with radiculopathy, unspecified cervical region: Secondary | ICD-10-CM

## 2018-07-14 DIAGNOSIS — M542 Cervicalgia: Secondary | ICD-10-CM

## 2018-07-14 DIAGNOSIS — M961 Postlaminectomy syndrome, not elsewhere classified: Secondary | ICD-10-CM | POA: Diagnosis not present

## 2018-07-14 DIAGNOSIS — G8929 Other chronic pain: Secondary | ICD-10-CM

## 2018-07-14 DIAGNOSIS — Z79899 Other long term (current) drug therapy: Secondary | ICD-10-CM

## 2018-07-14 DIAGNOSIS — M4716 Other spondylosis with myelopathy, lumbar region: Secondary | ICD-10-CM

## 2018-07-14 DIAGNOSIS — F329 Major depressive disorder, single episode, unspecified: Secondary | ICD-10-CM

## 2018-07-14 DIAGNOSIS — M797 Fibromyalgia: Secondary | ICD-10-CM

## 2018-07-14 DIAGNOSIS — M7918 Myalgia, other site: Secondary | ICD-10-CM

## 2018-07-14 DIAGNOSIS — G894 Chronic pain syndrome: Secondary | ICD-10-CM

## 2018-07-14 DIAGNOSIS — Z5181 Encounter for therapeutic drug level monitoring: Secondary | ICD-10-CM

## 2018-07-14 MED ORDER — MORPHINE SULFATE 15 MG PO TABS: 15.0000 mg | ORAL_TABLET | Freq: Two times a day (BID) | ORAL | 0 refills | Status: DC | PRN

## 2018-07-14 NOTE — Progress Notes (Signed)
Subjective:    Patient ID: Robin Morgan, female    DOB: December 04, 1955, 62 y.o.   MRN: [id number]  HPI: Robin Morgan is a 62 y.o. female who returns for follow up appointment for chronic pain and medication refill. She states her pain is located in her neck radiating into her bilateral shoulders and upper- mid back pain. Also reports increase intensity of back pain, she's only receiving 4 hours of relief of pain. We will change her Therapy to Morphine Sulfate IR, she verbalizes understanding. She rates her pain 8. Her current exercise regime is walking and performing stretching exercises.  Robin Morgan Morphine equivalent is 22.83  MME. Last UDS was Performed on 01/31/2018, it was consistent.    Pain Inventory Average Pain 9 Pain Right Now 8 My pain is sharp  In the last 24 hours, has pain interfered with the following? General activity 9 Relation with others 9 Enjoyment of life 10 What TIME of day is your pain at its worst? all Sleep (in general) Fair  Pain is worse with: sitting and standing Pain improves with: medication Relief from Meds: 7  Mobility ability to climb steps?  yes do you drive?  yes  Function disabled: date disabled .  Neuro/Psych bladder control problems bowel control problems trouble walking spasms  Prior Studies Any changes since last visit?  no  Physicians involved in your care Any changes since last visit?  no   Family History  Problem Relation Age of Onset  . Alzheimer's disease Mother   . Diabetes Father   . Heart disease Maternal Grandmother   . Breast cancer Paternal Grandmother 35   Social History   Socioeconomic History  . Marital status: Divorced    Spouse name: Not on file  . Number of children: 1  . Years of education: Not on file  . Highest education level: Not on file  Occupational History  . Occupation: disabled  Social Needs  . Financial resource strain: Not on file  . Food insecurity:    Worry: Not on file   Inability: Not on file  . Transportation needs:    Medical: Not on file    Non-medical: Not on file  Tobacco Use  . Smoking status: Never Smoker  . Smokeless tobacco: Never Used  Substance and Sexual Activity  . Alcohol use: No  . Drug use: No  . Sexual activity: Not Currently  Lifestyle  . Physical activity:    Days per week: Not on file    Minutes per session: Not on file  . Stress: Not on file  Relationships  . Social connections:    Talks on phone: Not on file    Gets together: Not on file    Attends religious service: Not on file    Active member of club or organization: Not on file    Attends meetings of clubs or organizations: Not on file    Relationship status: Not on file  Other Topics Concern  . Not on file  Social History Narrative  . Not on file   Past Surgical History:  Procedure Laterality Date  . ABDOMINAL HYSTERECTOMY     1986  . APPENDECTOMY     1983  . BREAST EXCISIONAL BIOPSY Right 1990's  . BREAST SURGERY     rt breast cyst done in the 90's  . CHOLECYSTECTOMY    . GALLBLADDER SURGERY     1983  . ROTATOR CUFF REPAIR     right  .  SPINE SURGERY    . TONSILLECTOMY     1973   Past Medical History:  Diagnosis Date  . Calcific tendonitis   . Cervical spondylosis without myelopathy   . Depression   . Diabetes mellitus   . Esophageal stricture   . Fibromyalgia   . GERD (gastroesophageal reflux disease)   . Hiatal hernia   . Hyperlipidemia   . Hypertension    There were no vitals taken for this visit.  Opioid Risk Score:   Fall Risk Score:  `1  Depression screen PHQ 2/9  Depression screen [facility] 2/9 06/16/2018 03/07/2018 02/08/2018 12/07/2017 11/09/2017 10/27/2017 07/29/2017  Decreased Interest 0 1 0 0 0 3 3  Down, Depressed, Hopeless 0 1 0 0 0 3 3  PHQ - 2 Score 0 2 0 0 0 6 6  Altered sleeping 0 - 0 - 0 - -  Tired, decreased energy 0 - 0 - 0 - -  Change in appetite 0 - 0 - 0 - -  Feeling bad or failure about yourself  0 - 0 - 0 - -  Trouble  concentrating 0 - 0 - 0 - -  Moving slowly or fidgety/restless 0 - 0 - 0 - -  Suicidal thoughts 0 - 0 - 0 - -  PHQ-9 Score 0 - 0 - 0 - -  Difficult doing work/chores Not difficult at all - Not difficult at all - - - -     Review of Systems  Constitutional: Positive for unexpected weight change.  HENT: Negative.   Eyes: Negative.   Respiratory: Negative.   Cardiovascular: Negative.   Gastrointestinal: Negative.   Endocrine: Negative.   Genitourinary: Positive for difficulty urinating.  Musculoskeletal: Positive for arthralgias, back pain, gait problem and myalgias.  Skin: Negative.   Allergic/Immunologic: Negative.   Hematological: Negative.   Psychiatric/Behavioral: Negative.   All other systems reviewed and are negative.      Objective:   Physical Exam  Constitutional: She is oriented to person, place, and time. She appears well-developed and well-nourished.  HENT:  Head: Normocephalic and atraumatic.  Neck: Normal range of motion. Neck supple.  Cardiovascular: Normal rate and regular rhythm.  Pulmonary/Chest: Effort normal and breath sounds normal.  Musculoskeletal:  Normal Muscle Bulk and Muscle Testing Reveals: Upper Extremities: Full ROM and Muscle Strength 5/5 Bilateral AC Joint Tenderness Thoracic Paraspinal Tenderness: T-1-T-3 , Lumbar Paraspinal Tenderness: L-3-L-5  Lower Extremities Full ROM and Muscle Strength 5/5 Narrow BasedGait   Neurological: She is alert and oriented to person, place, and time.  Skin: Skin is warm and dry.  Psychiatric: She has a normal mood and affect. Her behavior is normal.  Nursing note and vitals reviewed.         Assessment & Plan:  1. Acute Exacerbation of Chronic Low Back Pain: RX: Change in Therapy. Discontinue Hydrocodone and we will prescribe Morphine Sulfate IR 15 mg twice a day #60 as needed for pain.  2. Fibromyalgia with myofascial pain: Continue home exercise program and heat therapy. Continuecurrent medication  regimen withFlexeril. 07/14/2018 3. Rotator cuff syndrome on the right: Continue with stretching exercises and heat therapy. 07/14/2018 4. Cervicalgia. Post-laminectomy syndrome, facet arthropathy: Cervical Radiculopathy: Continue current medication regimen withGabapentin. 07/14/2018 S/ P EMG with Dr. Naaman Plummer on 03/08/2017: Diagnosed with Moderate to Severe CTS of Left wrist:07/14/2018 5. Depression: Continuecurrent medication regimen withCymbalta. PCP increase dose to 90 mg daily. 07/14/2018 6. Mid/low back pain/ Lumbar Spondylosis: Continue Current Medication and stretching and heat therapy. 07/14/2018  RX: Morphine Sulfate IR 15 mg one tablet twice a day as needed for pain #60. Discontinue  Hydrocodone 7.5/325 mg one tablet every 8 hours as needed for moderate pain #70. 07/14/2018 7. Lumbar Radiculopathy:Continuecurrent medication regimen withGabapentin. 07/14/2018 8.OA of right hand: Continue using Voltaren gel/ May substitute with Voltaren Tablet, she realizes she can't use both and verbalizes understanding. Continue withheat therapy. 07/14/2018 9. Migraines: No complaints today. Continue to Monitor 07/14/2018 10. Left CTS:S/P EMG on 03/08/2017: S/P Carpal Tunnel Release on 06/14/2017 via . Dr. Apolonio Schneiders. 07/14/2018 11.. Muscle Spasm: Continue Flexeril. Continue to Monitor. 07/14/2018  20 minutes of face to face patient care time was spent during this visit. All questions were encouraged and answered.   F/U in 2month

## 2018-07-17 ENCOUNTER — Encounter: Payer: Self-pay | Admitting: Adult Health

## 2018-07-18 ENCOUNTER — Ambulatory Visit: Payer: Medicaid Other | Admitting: Adult Health

## 2018-07-18 ENCOUNTER — Telehealth: Payer: Self-pay

## 2018-07-18 ENCOUNTER — Encounter: Payer: Self-pay | Admitting: Adult Health

## 2018-07-18 VITALS — BP 125/66 | HR 101 | Ht 61.5 in | Wt 151.0 lb

## 2018-07-18 DIAGNOSIS — Z9989 Dependence on other enabling machines and devices: Secondary | ICD-10-CM

## 2018-07-18 DIAGNOSIS — G4733 Obstructive sleep apnea (adult) (pediatric): Secondary | ICD-10-CM | POA: Diagnosis not present

## 2018-07-18 NOTE — Telephone Encounter (Signed)
Recieved email notification that a prior Robin Morgan is needed for this patients morphine.  Prior auth started 07-15-18

## 2018-07-18 NOTE — Telephone Encounter (Signed)
Prior auth approved via [place] [facility] as of 07-18-2018

## 2018-07-18 NOTE — Progress Notes (Signed)
PATIENT: Robin Morgan DOB: Sep 10, 1955  REASON FOR VISIT: follow up HISTORY FROM: patient  HISTORY OF PRESENT ILLNESS: Today 07/18/18:  Robin Morgan is a 62 year old female with a history of obstructive sleep apnea on CPAP.  Her CPAP download indicates that she use her machine 23 out of 30 days for compliance of 77%.  She use her machine greater than 4 hours 15 days for compliance of 50%.  She states that with her fibromyalgia symptoms she has a severe headache and is unable to use the machine.  On average she uses her machine 3 hours and 58 minutes each night.  She is on AutoSet with her pressure fluctuating between 7 and 14 cm of water with EPR 3.  Her residual AHI is 1.  She does not have a significant leak.  She returns today for evaluation.  HISTORY 07/14/17 Robin Morgan is a 62 year old female with a history of obstructive sleep apnea on CPAP.  She returns today for a compliance download.  Her download indicates that she use her machine 29 out of 30 days for compliance of 97%.  She used her machine greater than 4 hours 27 out of 30 days for compliance of 90%.  On average she uses her machine 5 hours and 56 minutes.  Her residual AHI is 1.7 on a minimum pressure of 7 cm of water and maximum pressure of 14 cm of water with EPR of 3.  She does not have a significant leak.  She reports that the CPAP continues to work well for her.  Her Epworth sleepiness score is 2.  She returns today for follow-up.  REVIEW OF SYSTEMS: Out of a complete 14 system review of symptoms, the patient complains only of the following symptoms, and all other reviewed systems are negative.  Epworth sleepiness score 2 fatigue severity score 44  ALLERGIES: Allergies  Allergen Reactions  . Compazine Other (See Comments)    Seizure-like reaction  . Prochlorperazine   . Naproxen Itching    HOME MEDICATIONS: Outpatient Medications Prior to Visit  Medication Sig Dispense Refill  . atorvastatin (LIPITOR) 40 MG tablet  Take 1 tablet (40 mg total) by mouth daily. 90 tablet 3  . b complex vitamins tablet Take 1 tablet by mouth daily.    . Calcium Carbonate-Vitamin D (CALCIUM-VITAMIN D) 500-200 MG-UNIT per tablet Take 1 tablet by mouth 2 (two) times daily with a meal.    . co-enzyme Q-10 30 MG capsule Take 30 mg by mouth 3 (three) times daily.    . cyclobenzaprine (FLEXERIL) 10 MG tablet TAKE 1 TABLET (10 MG TOTAL) BY MOUTH EVERY 8 (EIGHT) HOURS AS NEEDED. 60 tablet 2  . diclofenac sodium (VOLTAREN) 1 % GEL Apply 2 g topically 4 (four) times daily. 3 Tube 3  . diphenoxylate-atropine (LOMOTIL) 2.5-0.025 MG tablet Take 1 tablet by mouth 4 (four) times daily as needed for diarrhea or loose stools. 60 tablet 1  . DULoxetine (CYMBALTA) 30 MG capsule Take 3 capsules (90 mg total) by mouth daily. 270 capsule 5  . esomeprazole (NEXIUM) 40 MG capsule Take 1 capsule (40 mg total) by mouth 2 (two) times daily before a meal. Morning and evening 180 capsule 3  . fenofibrate 160 MG tablet Take 1 tablet (160 mg total) by mouth daily. 90 tablet 3  . gabapentin (NEURONTIN) 300 MG capsule TAKE 1 CAP BY MOUTH AS DIRECTED.TAKE 2 CAPSULES IN THE MORNING, 2 CAPSULES AT LUNCH AND 3 AT BEDTIME 210 capsule 3  .  LANTUS SOLOSTAR 100 UNIT/ML Solostar Pen Inject 64 Units into the skin daily at 10 pm. 5 pen 11  . levothyroxine (SYNTHROID, LEVOTHROID) 88 MCG tablet Take 1 tablet (88 mcg total) by mouth daily before breakfast. 90 tablet 3  . lisinopril (PRINIVIL,ZESTRIL) 10 MG tablet Take 1 tablet (10 mg total) by mouth daily. 90 tablet 3  . magnesium gluconate (MAGONATE) 500 MG tablet Take 1 tablet (500 mg total) by mouth daily. 90 tablet 3  . metFORMIN (GLUCOPHAGE) 1000 MG tablet Take 1 tablet by mouth 2 (two) times daily.  3  . morphine (MSIR) 15 MG tablet Take 1 tablet (15 mg total) by mouth 2 (two) times daily as needed for moderate pain. 60 tablet 0  . Omega-3 Fatty Acids (FISH OIL) 1000 MG CAPS Take 2 capsules by mouth daily.    .  ranitidine (ZANTAC) 300 MG tablet Take 300 mg by mouth at bedtime.    . triamcinolone cream (KENALOG) 0.1 % Apply 1 application topically 2 (two) times daily. As needed 30 g 1  . zolpidem (AMBIEN) 10 MG tablet Take 1 tablet (10 mg total) by mouth at bedtime as needed. for sleep 30 tablet 5   No facility-administered medications prior to visit.     PAST MEDICAL HISTORY: Past Medical History:  Diagnosis Date  . Calcific tendonitis   . Cervical spondylosis without myelopathy   . Depression   . Diabetes mellitus   . Esophageal stricture   . Fibromyalgia   . GERD (gastroesophageal reflux disease)   . Hiatal hernia   . Hyperlipidemia   . Hypertension     PAST SURGICAL HISTORY: Past Surgical History:  Procedure Laterality Date  . ABDOMINAL HYSTERECTOMY     1986  . APPENDECTOMY     1983  . BREAST EXCISIONAL BIOPSY Right 1990's  . BREAST SURGERY     rt breast cyst done in the 90's  . CHOLECYSTECTOMY    . GALLBLADDER SURGERY     1983  . ROTATOR CUFF REPAIR     right  . SPINE SURGERY    . TONSILLECTOMY     1973    FAMILY HISTORY: Family History  Problem Relation Age of Onset  . Alzheimer's disease Mother   . Diabetes Father   . Heart disease Maternal Grandmother   . Breast cancer Paternal Grandmother 50    SOCIAL HISTORY: Social History   Socioeconomic History  . Marital status: Divorced    Spouse name: Not on file  . Number of children: 1  . Years of education: Not on file  . Highest education level: Not on file  Occupational History  . Occupation: disabled  Social Needs  . Financial resource strain: Not on file  . Food insecurity:    Worry: Not on file    Inability: Not on file  . Transportation needs:    Medical: Not on file    Non-medical: Not on file  Tobacco Use  . Smoking status: Never Smoker  . Smokeless tobacco: Never Used  Substance and Sexual Activity  . Alcohol use: No  . Drug use: No  . Sexual activity: Not Currently  Lifestyle  .  Physical activity:    Days per week: Not on file    Minutes per session: Not on file  . Stress: Not on file  Relationships  . Social connections:    Talks on phone: Not on file    Gets together: Not on file    Attends religious service: Not  on file    Active member of club or organization: Not on file    Attends meetings of clubs or organizations: Not on file    Relationship status: Not on file  . Intimate partner violence:    Fear of current or ex partner: Not on file    Emotionally abused: Not on file    Physically abused: Not on file    Forced sexual activity: Not on file  Other Topics Concern  . Not on file  Social History Narrative  . Not on file      PHYSICAL EXAM  Vitals:   07/18/18 1059  BP: 125/66  Pulse: (!) 101  Weight: 151 lb (68.5 kg)  Height: 5' 1.5" (1.562 m)   Body mass index is 28.07 kg/m.  Generalized: Well developed, in no acute distress  Chest: Lungs clear to auscultation bilaterally  Neurological examination  Mentation: Alert oriented to time, place, history taking. Follows all commands speech and language fluent Cranial nerve II-XII: Pupils were equal round reactive to light. Extraocular movements were full, visual field were full on confrontational test. Facial sensation and strength were normal. Uvula tongue midline. Head turning and shoulder shrug  were normal and symmetric.  Neck circumference 15-1/2 inches, Mallampati 3+ Motor: The motor testing reveals 5 over 5 strength of all 4 extremities. Good symmetric motor tone is noted throughout.  Sensory: Sensory testing is intact to soft touch on all 4 extremities. No evidence of extinction is noted.  Coordination: Cerebellar testing reveals good finger-nose-finger and heel-to-shin bilaterally.  Gait and station: Gait is normal.    DIAGNOSTIC DATA (LABS, IMAGING, TESTING) - I reviewed patient records, labs, notes, testing and imaging myself where available.  Lab Results  Component Value Date     WBC 8.1 06/16/2018   HGB 12.6 06/16/2018   HCT 39.0 06/16/2018   MCV 84.6 06/16/2018   PLT 351.0 06/16/2018      Component Value Date/Time   NA 140 06/16/2018 1035   NA 140 09/22/2016 1013   K 4.1 06/16/2018 1035   CL 104 06/16/2018 1035   CO2 29 06/16/2018 1035   GLUCOSE 100 (H) 06/16/2018 1035   BUN 17 06/16/2018 1035   BUN 18 09/22/2016 1013   CREATININE 0.88 06/16/2018 1035   CALCIUM 9.6 06/16/2018 1035   PROT 7.2 06/16/2018 1035   PROT 7.2 09/22/2016 1013   ALBUMIN 4.6 06/16/2018 1035   ALBUMIN 4.7 09/22/2016 1013   AST 30 06/16/2018 1035   ALT 24 06/16/2018 1035   ALKPHOS 64 06/16/2018 1035   BILITOT 0.4 06/16/2018 1035   BILITOT 0.4 09/22/2016 1013   GFRNONAA 62 09/22/2016 1013   GFRAA 72 09/22/2016 1013   Lab Results  Component Value Date   CHOL 132 06/16/2018   HDL 39.90 06/16/2018   LDLCALC 68 06/16/2018   TRIG 120.0 06/16/2018   CHOLHDL 3 06/16/2018   Lab Results  Component Value Date   HGBA1C 7.0 (A) 06/16/2018   No results found for: VITAMINB12 Lab Results  Component Value Date   TSH 1.14 06/16/2018      ASSESSMENT AND PLAN 63 y.o. year old female  has a past medical history of Calcific tendonitis, Cervical spondylosis without myelopathy, Depression, Diabetes mellitus, Esophageal stricture, Fibromyalgia, GERD (gastroesophageal reflux disease), Hiatal hernia, Hyperlipidemia, and Hypertension. here with:  1.  Obstructive sleep apnea on CPAP  The patient CPAP download indicates suboptimal compliance.  The patient is encouraged to use her CPAP nightly and greater than 4 hours  each night.  She is advised that if her symptoms worsen or she develops new symptoms she should let us know.  She will follow-up in 1 year or sooner if needed.  I spent 15 minutes with the patient. 50% of this time was spent reviewing CPAP download   Ward Givens, MSN, NP-C 07/18/2018, 11:45 AM [facility] [address] [place], [place]  [address] [phone]

## 2018-07-18 NOTE — Patient Instructions (Signed)

## 2018-07-29 NOTE — Telephone Encounter (Signed)
Message from patient

## 2018-08-12 ENCOUNTER — Encounter: Payer: Medicare HMO | Attending: Physical Medicine and Rehabilitation | Admitting: Registered Nurse

## 2018-08-12 DIAGNOSIS — M961 Postlaminectomy syndrome, not elsewhere classified: Secondary | ICD-10-CM | POA: Insufficient documentation

## 2018-08-12 DIAGNOSIS — M609 Myositis, unspecified: Secondary | ICD-10-CM | POA: Insufficient documentation

## 2018-08-15 ENCOUNTER — Encounter: Payer: Medicare HMO | Admitting: Registered Nurse

## 2018-08-15 ENCOUNTER — Encounter: Payer: Self-pay | Admitting: Registered Nurse

## 2018-08-15 ENCOUNTER — Ambulatory Visit: Payer: Medicaid Other | Admitting: Physical Medicine & Rehabilitation

## 2018-08-15 VITALS — BP 122/71 | HR 81 | Wt 150.0 lb

## 2018-08-15 DIAGNOSIS — G8929 Other chronic pain: Secondary | ICD-10-CM

## 2018-08-15 DIAGNOSIS — M961 Postlaminectomy syndrome, not elsewhere classified: Secondary | ICD-10-CM | POA: Diagnosis not present

## 2018-08-15 DIAGNOSIS — M797 Fibromyalgia: Secondary | ICD-10-CM

## 2018-08-15 DIAGNOSIS — M4716 Other spondylosis with myelopathy, lumbar region: Secondary | ICD-10-CM | POA: Diagnosis not present

## 2018-08-15 DIAGNOSIS — G894 Chronic pain syndrome: Secondary | ICD-10-CM

## 2018-08-15 DIAGNOSIS — Z79899 Other long term (current) drug therapy: Secondary | ICD-10-CM | POA: Diagnosis not present

## 2018-08-15 DIAGNOSIS — Z5181 Encounter for therapeutic drug level monitoring: Secondary | ICD-10-CM

## 2018-08-15 DIAGNOSIS — R69 Illness, unspecified: Secondary | ICD-10-CM | POA: Diagnosis not present

## 2018-08-15 DIAGNOSIS — M546 Pain in thoracic spine: Secondary | ICD-10-CM

## 2018-08-15 DIAGNOSIS — M7918 Myalgia, other site: Secondary | ICD-10-CM | POA: Diagnosis not present

## 2018-08-15 DIAGNOSIS — M609 Myositis, unspecified: Secondary | ICD-10-CM | POA: Diagnosis not present

## 2018-08-15 DIAGNOSIS — F329 Major depressive disorder, single episode, unspecified: Secondary | ICD-10-CM

## 2018-08-15 MED ORDER — MORPHINE SULFATE 15 MG PO TABS: 15.0000 mg | ORAL_TABLET | Freq: Two times a day (BID) | ORAL | 0 refills | Status: DC | PRN

## 2018-08-15 NOTE — Progress Notes (Signed)
Subjective:    Patient ID: Robin Morgan, female    DOB: [birth year], 63 y.o.   MRN: [id number]  HPI: Robin Morgan is a 63 y.o. female who returns for follow up appointment for chronic pain and medication refill. She states her pain is located in her mid-back. She rates her pain 7. Her  current exercise regime is walking and performing stretching exercises.  Ms. Hesler Morphine equivalent is 30.00 MME.  Last UDS was Performed on 01/31/2018, it was consistent.    Pain Inventory Average Pain 8 Pain Right Now 7 My pain is constant and aching  In the last 24 hours, has pain interfered with the following? General activity 8 Relation with others 9 Enjoyment of life 8 What TIME of day is your pain at its worst? all Sleep (in general) Fair  Pain is worse with: sitting and standing Pain improves with: medication Relief from Meds: 9  Mobility ability to climb steps?  yes do you drive?  yes  Function disabled: date disabled .  Neuro/Psych bladder control problems bowel control problems spasms  Prior Studies Any changes since last visit?  no  Physicians involved in your care Any changes since last visit?  no   Family History  Problem Relation Age of Onset  . Alzheimer's disease Mother   . Diabetes Father   . Heart disease Maternal Grandmother   . Breast cancer Paternal Grandmother 19   Social History   Socioeconomic History  . Marital status: Divorced    Spouse name: Not on file  . Number of children: 1  . Years of education: Not on file  . Highest education level: Not on file  Occupational History  . Occupation: disabled  Social Needs  . Financial resource strain: Not on file  . Food insecurity:    Worry: Not on file    Inability: Not on file  . Transportation needs:    Medical: Not on file    Non-medical: Not on file  Tobacco Use  . Smoking status: Never Smoker  . Smokeless tobacco: Never Used  Substance and Sexual Activity  . Alcohol use: No  .  Drug use: No  . Sexual activity: Not Currently  Lifestyle  . Physical activity:    Days per week: Not on file    Minutes per session: Not on file  . Stress: Not on file  Relationships  . Social connections:    Talks on phone: Not on file    Gets together: Not on file    Attends religious service: Not on file    Active member of club or organization: Not on file    Attends meetings of clubs or organizations: Not on file    Relationship status: Not on file  Other Topics Concern  . Not on file  Social History Narrative  . Not on file   Past Surgical History:  Procedure Laterality Date  . ABDOMINAL HYSTERECTOMY     1986  . APPENDECTOMY     1983  . BREAST EXCISIONAL BIOPSY Right 1990's  . BREAST SURGERY     rt breast cyst done in the 90's  . CHOLECYSTECTOMY    . GALLBLADDER SURGERY     1983  . ROTATOR CUFF REPAIR     right  . SPINE SURGERY    . TONSILLECTOMY     1973   Past Medical History:  Diagnosis Date  . Calcific tendonitis   . Cervical spondylosis without myelopathy   . Depression   .  Diabetes mellitus   . Esophageal stricture   . Fibromyalgia   . GERD (gastroesophageal reflux disease)   . Hiatal hernia   . Hyperlipidemia   . Hypertension    BP 122/71   Pulse 81   Wt 150 lb (68 kg)   SpO2 94%   BMI 27.88 kg/m   Opioid Risk Score:   Fall Risk Score:  `1  Depression screen PHQ 2/9  Depression screen [facility] 2/9 06/16/2018 03/07/2018 02/08/2018 12/07/2017 11/09/2017 10/27/2017 07/29/2017  Decreased Interest 0 1 0 0 0 3 3  Down, Depressed, Hopeless 0 1 0 0 0 3 3  PHQ - 2 Score 0 2 0 0 0 6 6  Altered sleeping 0 - 0 - 0 - -  Tired, decreased energy 0 - 0 - 0 - -  Change in appetite 0 - 0 - 0 - -  Feeling bad or failure about yourself  0 - 0 - 0 - -  Trouble concentrating 0 - 0 - 0 - -  Moving slowly or fidgety/restless 0 - 0 - 0 - -  Suicidal thoughts 0 - 0 - 0 - -  PHQ-9 Score 0 - 0 - 0 - -  Difficult doing work/chores Not difficult at all - Not difficult at  all - - - -    Review of Systems  Constitutional: Positive for unexpected weight change.  HENT: Negative.   Eyes: Negative.   Respiratory: Negative.   Cardiovascular: Negative.   Gastrointestinal: Positive for abdominal pain, constipation and diarrhea.  Endocrine:       High blood sugar Low blood sugar  Genitourinary: Positive for difficulty urinating.  Musculoskeletal: Positive for arthralgias, back pain and neck pain.       Spasms   Skin: Negative.   Allergic/Immunologic: Negative.   Psychiatric/Behavioral: Negative.        Objective:   Physical Exam Vitals signs and nursing note reviewed.  Constitutional:      Appearance: Normal appearance.  Neck:     Musculoskeletal: Normal range of motion and neck supple.  Cardiovascular:     Rate and Rhythm: Normal rate and regular rhythm.     Pulses: Normal pulses.     Heart sounds: Normal heart sounds.  Pulmonary:     Effort: Pulmonary effort is normal.     Breath sounds: Normal breath sounds.  Musculoskeletal:     Comments: Normal Muscle Bulk and Muscle Testing Reveals:  Upper Extremities: Full ROM and Muscle Strength 5/5 Thoracic Paraspinal Tenderness: T-7-T-9  Lower Extremities: Full ROM and Muscle Strength 5/5 Arises from Table with Ease Narrow Based Gait   Skin:    General: Skin is warm and dry.  Neurological:     Mental Status: She is alert and oriented to person, place, and time.  Psychiatric:        Mood and Affect: Mood normal.        Behavior: Behavior normal.           Assessment & Plan:  1. Fibromyalgia with myofascial pain: Continue home exercise program and heat therapy. Continuecurrent medication regimen withFlexeril. 08/15/2018 2. Rotator cuff syndrome on the right: Continue with stretching exercises and heat therapy. 08/15/2018 3. Cervicalgia. Post-laminectomy syndrome, facet arthropathy:Cervical Radiculopathy: Continue current medication regimen withGabapentin. 08/15/2018 S/ P EMG with Dr.  Naaman Plummer on 03/08/2017: Diagnosed with Moderate to Severe CTS of Left wrist:08/15/2018 4. Depression: Continuecurrent medication regimen withCymbalta. PCP Following. Current  dose 90 mg daily.08/15/2018 5. Mid/low back pain/ Lumbar Spondylosis: Continue  Current Medication and stretching and heat therapy.08/15/2018 Refilled: Morphine Sulfate IR 15 mg one tablet twice a day as needed for pain #60. 6. Lumbar Radiculopathy:Continuecurrent medication regimen withGabapentin. 08/15/2018 7.OA of right hand: Continue using Voltaren gel/ May substitute with Voltaren Tablet, she realizes she can't use both and verbalizes understanding. Continue withheat therapy. 08/15/2018 8. Migraines: No complaints today. Continue to Monitor 0106/2020 9. Left CTS:S/P EMG on 03/08/2017: S/P Carpal Tunnel Release on 06/14/2017 via . Dr. Apolonio Schneiders.08/15/2018 10.. Muscle Spasm: Continue Flexeril. Continue to Monitor. 08/15/2018  20 minutes of face to face patient care time was spent during this visit. All questions were encouraged and answered.   F/U in 25month

## 2018-08-24 ENCOUNTER — Telehealth: Payer: Self-pay | Admitting: *Deleted

## 2018-08-24 NOTE — Telephone Encounter (Signed)
Lantus Solostar PA Case: [id number], Status: Approved  Coverage Starts on: 08/08/2018  Coverage Ends on: 08/10/2019. Questions Contact [phone]

## 2018-09-02 ENCOUNTER — Ambulatory Visit (INDEPENDENT_AMBULATORY_CARE_PROVIDER_SITE_OTHER): Payer: Medicare HMO

## 2018-09-02 ENCOUNTER — Encounter: Payer: Self-pay | Admitting: Family Medicine

## 2018-09-02 ENCOUNTER — Ambulatory Visit (INDEPENDENT_AMBULATORY_CARE_PROVIDER_SITE_OTHER): Payer: Medicare HMO | Admitting: Family Medicine

## 2018-09-02 VITALS — BP 122/68 | HR 92 | Temp 98.6°F | Resp 16 | Ht 61.0 in | Wt 152.2 lb

## 2018-09-02 DIAGNOSIS — K529 Noninfective gastroenteritis and colitis, unspecified: Secondary | ICD-10-CM | POA: Diagnosis not present

## 2018-09-02 DIAGNOSIS — R350 Frequency of micturition: Secondary | ICD-10-CM

## 2018-09-02 DIAGNOSIS — Z79891 Long term (current) use of opiate analgesic: Secondary | ICD-10-CM | POA: Diagnosis not present

## 2018-09-02 DIAGNOSIS — K5900 Constipation, unspecified: Secondary | ICD-10-CM | POA: Diagnosis not present

## 2018-09-02 DIAGNOSIS — M797 Fibromyalgia: Secondary | ICD-10-CM

## 2018-09-02 DIAGNOSIS — G894 Chronic pain syndrome: Secondary | ICD-10-CM

## 2018-09-02 DIAGNOSIS — R1030 Lower abdominal pain, unspecified: Secondary | ICD-10-CM | POA: Diagnosis not present

## 2018-09-02 LAB — CBC WITH DIFFERENTIAL/PLATELET
Basophils Absolute: 0.1 10*3/uL (ref 0.0–0.1)
Basophils Relative: 0.7 % (ref 0.0–3.0)
Eosinophils Absolute: 0.4 10*3/uL (ref 0.0–0.7)
Eosinophils Relative: 4.3 % (ref 0.0–5.0)
HCT: 39 % (ref 36.0–46.0)
HEMOGLOBIN: 12.2 g/dL (ref 12.0–15.0)
Lymphocytes Relative: 34.5 % (ref 12.0–46.0)
Lymphs Abs: 2.8 10*3/uL (ref 0.7–4.0)
MCHC: 31.3 g/dL (ref 30.0–36.0)
MCV: 84.2 fl (ref 78.0–100.0)
Monocytes Absolute: 0.6 10*3/uL (ref 0.1–1.0)
Monocytes Relative: 7.2 % (ref 3.0–12.0)
Neutro Abs: 4.4 10*3/uL (ref 1.4–7.7)
Neutrophils Relative %: 53.3 % (ref 43.0–77.0)
Platelets: 398 10*3/uL (ref 150.0–400.0)
RBC: 4.63 Mil/uL (ref 3.87–5.11)
RDW: 14.8 % (ref 11.5–15.5)
WBC: 8.2 10*3/uL (ref 4.0–10.5)

## 2018-09-02 LAB — COMPREHENSIVE METABOLIC PANEL
ALT: 23 U/L (ref 0–35)
AST: 29 U/L (ref 0–37)
Albumin: 4.3 g/dL (ref 3.5–5.2)
Alkaline Phosphatase: 62 U/L (ref 39–117)
BUN: 14 mg/dL (ref 6–23)
CO2: 28 mEq/L (ref 19–32)
Calcium: 9.7 mg/dL (ref 8.4–10.5)
Chloride: 106 mEq/L (ref 96–112)
Creatinine, Ser: 0.81 mg/dL (ref 0.40–1.20)
GFR: 70.97 mL/min (ref >60.00)
Glucose, Bld: 157 mg/dL — ABNORMAL HIGH (ref 70–99)
Potassium: 4.3 mEq/L (ref 3.5–5.1)
Sodium: 143 mEq/L (ref 135–145)
Total Bilirubin: 0.4 mg/dL (ref 0.2–1.2)
Total Protein: 6.6 g/dL (ref 6.0–8.3)

## 2018-09-02 LAB — POCT URINALYSIS DIPSTICK
Bilirubin, UA: NEGATIVE
Blood, UA: NEGATIVE
Glucose, UA: NEGATIVE
Ketones, UA: NEGATIVE
LEUKOCYTES UA: NEGATIVE
Nitrite, UA: NEGATIVE
Protein, UA: POSITIVE — AB
Spec Grav, UA: >=1.03 — AB (ref 1.010–1.025)
Urobilinogen, UA: 0.2 E.U./dL
pH, UA: 5 (ref 5.0–8.0)

## 2018-09-02 MED ORDER — LANTUS SOLOSTAR 100 UNIT/ML ~~LOC~~ SOPN: 64.0000 [IU] | PEN_INJECTOR | Freq: Every day | SUBCUTANEOUS | 11 refills | Status: DC

## 2018-09-02 NOTE — Progress Notes (Signed)
Subjective  CC:  Chief Complaint  Patient presents with  . Abdominal Pain    First bowel movement in more that 1 week was 2 days ago, pain did not ease. Denies nausea and vomiting. She was recently changed from Hydrocodone to Morphine. She report urine frequency    HPI: Robin Morgan is a 63 y.o. female who presents to the office today to address the problems listed above in the chief complaint.  63 year old with chronic pain and fibromyalgia and depression on chronic narcotics here for 1 week history of lower abdominal pain, centrally located for the most part.  Describes as pressure.  Started about a week ago.  She did not have a bowel movement for almost a week, then had normal soft large amount of brown stool a few days ago.  She typically has daily bowel movements once or twice.  She does suffer from chronic diarrhea at times and uses Lomotil for this.Blood in the stool or mucus in the stool.  She is on chronic narcotics and these have been adjusted recently.  She denies fevers, chills, blood or mucoid stools, urinary dysuria but admits to frequency.  She continues to have low back pain and right shoulder pain managed by pain management.  She is a history of colonoscopy and recent EGD: No history of diverticulitis.  Diabetic: Fairly good control at last check.  She is on metformin.  She takes her Lantus in the morning.  She denies hypoglycemia. Assessment  1. Lower abdominal pain   2. Urine frequency   3. Fibromyalgia   4. Chronic pain associated with significant psychosocial dysfunction   5. Chronic diarrhea   6. Chronic prescription opiate use      Plan   Lower abdominal pain: Etiology unclear but could be due to constipation due to change in opiate prescriptions.  Check KUB and blood work to rule out infection.  Recommend continuation of stool softener as needed but add MiraLAX.  Monitor for fevers or increasing pain.  Consider diverticulitis or other cause.  Benign abdomen  today.  She appears well.  Follow-up next week if persist.  No change in diabetic medications or pain medications today.  Hydrate.  Follow up: Return if symptoms worsen or fail to improve.  Visit date not found  Orders Placed This Encounter  Procedures  . DG Abd 1 View  . CBC with Differential/Platelet  . Comprehensive metabolic panel  . POCT urinalysis dipstick   Meds ordered this encounter  Medications  . LANTUS SOLOSTAR 100 UNIT/ML Solostar Pen    Sig: Inject 64 Units into the skin daily.    Dispense:  5 pen    Refill:  11      I reviewed the patients updated PMH, FH, and SocHx.    Patient Active Problem List   Diagnosis Date Noted  . Diabetic peripheral neuropathy associated with type 2 diabetes mellitus ([place]) 01/06/2017    Priority: High  . OSA (obstructive sleep apnea) 03/10/2016    Priority: High  . Controlled diabetes mellitus with diabetic neuropathy, with long-term current use of insulin ([place]) 10/11/2014    Priority: High  . HTN (hypertension) 10/11/2014    Priority: High  . Hyperlipidemia 10/11/2014    Priority: High  . DDD (degenerative disc disease), lumbar 01/15/2014    Priority: High  . Hypothyroidism, acquired 06/12/2013    Priority: High  . Cervical post-laminectomy syndrome 05/10/2013    Priority: High  . Chronic pain associated with significant psychosocial dysfunction  01/03/2010    Priority: High  . Esophageal stricture     Priority: Medium  . Tenosynovitis, wrist 10/07/2017    Priority: Medium  . Trigger thumb of left hand 10/07/2017    Priority: Medium  . Laryngopharyngeal reflux (LPR) 08/26/2017    Priority: Medium  . Dysphagia 08/26/2017    Priority: Medium  . Left carpal tunnel syndrome 03/08/2017    Priority: Medium  . Fibromyalgia 02/12/2016    Priority: Medium  . Osteopenia 12/01/2012    Priority: Medium  . Chronic diarrhea 07/04/2012    Priority: Medium  . Chronic prescription opiate use 09/02/2018   Current Meds    Medication Sig  . atorvastatin (LIPITOR) 40 MG tablet Take 1 tablet (40 mg total) by mouth daily.  Marland Kitchen b complex vitamins tablet Take 1 tablet by mouth daily.  . Calcium Carbonate-Vitamin D (CALCIUM-VITAMIN D) 500-200 MG-UNIT per tablet Take 1 tablet by mouth 2 (two) times daily with a meal.  . co-enzyme Q-10 30 MG capsule Take 30 mg by mouth 3 (three) times daily.  . cyclobenzaprine (FLEXERIL) 10 MG tablet TAKE 1 TABLET (10 MG TOTAL) BY MOUTH EVERY 8 (EIGHT) HOURS AS NEEDED.  Marland Kitchen diclofenac sodium (VOLTAREN) 1 % GEL Apply 2 g topically 4 (four) times daily.  . diphenoxylate-atropine (LOMOTIL) 2.5-0.025 MG tablet Take 1 tablet by mouth 4 (four) times daily as needed for diarrhea or loose stools.  . DULoxetine (CYMBALTA) 30 MG capsule Take 3 capsules (90 mg total) by mouth daily.  Marland Kitchen esomeprazole (NEXIUM) 40 MG capsule Take 1 capsule (40 mg total) by mouth 2 (two) times daily before a meal. Morning and evening  . fenofibrate 160 MG tablet Take 1 tablet (160 mg total) by mouth daily.  Marland Kitchen gabapentin (NEURONTIN) 300 MG capsule TAKE 1 CAP BY MOUTH AS DIRECTED.TAKE 2 CAPSULES IN THE MORNING, 2 CAPSULES AT LUNCH AND 3 AT BEDTIME  . gentamicin ointment (GARAMYCIN) 0.1 % Apply 1 application topically 3 (three) times daily.  Marland Kitchen LANTUS SOLOSTAR 100 UNIT/ML Solostar Pen Inject 64 Units into the skin daily.  Marland Kitchen levothyroxine (SYNTHROID, LEVOTHROID) 88 MCG tablet Take 1 tablet (88 mcg total) by mouth daily before breakfast.  . lisinopril (PRINIVIL,ZESTRIL) 10 MG tablet Take 1 tablet (10 mg total) by mouth daily.  . magnesium gluconate (MAGONATE) 500 MG tablet Take 1 tablet (500 mg total) by mouth daily.  . metFORMIN (GLUCOPHAGE) 1000 MG tablet Take 1 tablet by mouth 2 (two) times daily.  Marland Kitchen morphine (MSIR) 15 MG tablet Take 1 tablet (15 mg total) by mouth 2 (two) times daily as needed for moderate pain.  . Omega-3 Fatty Acids (FISH OIL) 1000 MG CAPS Take 2 capsules by mouth daily.  . ranitidine (ZANTAC) 300 MG tablet  Take 300 mg by mouth at bedtime.  . triamcinolone cream (KENALOG) 0.1 % Apply 1 application topically 2 (two) times daily. As needed  . zolpidem (AMBIEN) 10 MG tablet Take 1 tablet (10 mg total) by mouth at bedtime as needed. for sleep  . [DISCONTINUED] LANTUS SOLOSTAR 100 UNIT/ML Solostar Pen Inject 64 Units into the skin daily at 10 pm.    Allergies: Patient is allergic to compazine; prochlorperazine; and naproxen. Family History: Patient family history includes Alzheimer's disease in her mother; Breast cancer (age of onset: 53) in her paternal grandmother; Diabetes in her father; Heart disease in her maternal grandmother. Social History:  Patient  reports that she has never smoked. She has never used smokeless tobacco. She reports that she does  not drink alcohol or use drugs.  Review of Systems: Constitutional: Negative for fever malaise or anorexia Cardiovascular: negative for chest pain Respiratory: negative for SOB or persistent cough Gastrointestinal: negative for abdominal pain  Objective  Vitals: BP 122/68   Pulse 92   Temp 98.6 F (37 C) (Oral)   Resp 16   Ht 5\' 1"  (1.549 m)   Wt 152 lb 3.2 oz (69 kg)   SpO2 96%   BMI 28.76 kg/m  General: no acute distress , A&Ox3, appears well HEENT: PEERL, conjunctiva normal, Oropharynx moist,neck is supple Cardiovascular:  RRR without murmur or gallop.  Respiratory:  Good breath sounds bilaterally, CTAB with normal respiratory effort Gastrointestinal: soft, flat abdomen, normal active bowel sounds, no palpable masses, no hepatosplenomegaly, no appreciated hernias Mild midepigastric tenderness and lower midline tenderness without rebound or guarding. Skin:  Warm, no rashes  Office Visit on 09/02/2018  Component Date Value Ref Range Status  . Color, UA 09/02/2018 Yellow   Final  . Glucose, UA 09/02/2018 Negative  Negative Final  . Bilirubin, UA 09/02/2018 Negative   Final  . Ketones, UA 09/02/2018 Negative   Final  . Spec Grav,  UA 09/02/2018 >=1.030* 1.010 - 1.025 Final  . Blood, UA 09/02/2018 Negative   Final  . pH, UA 09/02/2018 5.0  5.0 - 8.0 Final  . Protein, UA 09/02/2018 Positive* Negative Final  . Urobilinogen, UA 09/02/2018 0.2  0.2 or 1.0 E.U./dL Final  . Nitrite, UA 09/02/2018 Negative   Final  . Leukocytes, UA 09/02/2018 Negative  Negative Final      Commons side effects, risks, benefits, and alternatives for medications and treatment plan prescribed today were discussed, and the patient expressed understanding of the given instructions. Patient is instructed to call or message via MyChart if he/she has any questions or concerns regarding our treatment plan. No barriers to understanding were identified. We discussed Red Flag symptoms and signs in detail. Patient expressed understanding regarding what to do in case of urgent or emergency type symptoms.   Medication list was reconciled, printed and provided to the patient in AVS. Patient instructions and summary information was reviewed with the patient as documented in the AVS. This note was prepared with assistance of Dragon voice recognition software. Occasional wrong-word or sound-a-like substitutions may have occurred due to the inherent limitations of voice recognition software

## 2018-09-02 NOTE — Patient Instructions (Signed)
Please follow up if symptoms do not improve or as needed.   This could all be due to constipation. Add miralax daily if not moving your bowels daily. Drink plenty of water.  Go to ER or urgent care if you develop a fever over the weekend.  Return here next week if it is not improving.  Your urine test today is ok.  I will release your lab results to you on your MyChart account with further instructions. Please reply with any questions.   Please go to our [facility] office to get your xrays done. You can walk in M-F between 8am and 5pm. Tell them you are there for xrays ordered by me. They will send me the results, then I will let you know the results with instructions.   Address: [place], [place], [place]  (office sits at [address] at [address] intersection; from here, turn left onto [address]), take to [address], turn right and go for a mile or so, office will be on left across form [employer] )

## 2018-09-15 ENCOUNTER — Encounter: Payer: Self-pay | Admitting: Registered Nurse

## 2018-09-15 ENCOUNTER — Encounter: Payer: Medicare HMO | Attending: Registered Nurse | Admitting: Registered Nurse

## 2018-09-15 VITALS — BP 115/70 | HR 99 | Ht 61.5 in | Wt 148.0 lb

## 2018-09-15 DIAGNOSIS — M961 Postlaminectomy syndrome, not elsewhere classified: Secondary | ICD-10-CM | POA: Diagnosis not present

## 2018-09-15 DIAGNOSIS — G894 Chronic pain syndrome: Secondary | ICD-10-CM | POA: Diagnosis not present

## 2018-09-15 DIAGNOSIS — M5010 Cervical disc disorder with radiculopathy, unspecified cervical region: Secondary | ICD-10-CM

## 2018-09-15 DIAGNOSIS — M546 Pain in thoracic spine: Secondary | ICD-10-CM | POA: Insufficient documentation

## 2018-09-15 DIAGNOSIS — M542 Cervicalgia: Secondary | ICD-10-CM

## 2018-09-15 DIAGNOSIS — Z79899 Other long term (current) drug therapy: Secondary | ICD-10-CM | POA: Insufficient documentation

## 2018-09-15 DIAGNOSIS — Z5181 Encounter for therapeutic drug level monitoring: Secondary | ICD-10-CM | POA: Diagnosis not present

## 2018-09-15 DIAGNOSIS — M797 Fibromyalgia: Secondary | ICD-10-CM | POA: Insufficient documentation

## 2018-09-15 DIAGNOSIS — M7918 Myalgia, other site: Secondary | ICD-10-CM

## 2018-09-15 DIAGNOSIS — G8929 Other chronic pain: Secondary | ICD-10-CM

## 2018-09-15 DIAGNOSIS — M4716 Other spondylosis with myelopathy, lumbar region: Secondary | ICD-10-CM | POA: Diagnosis not present

## 2018-09-15 MED ORDER — MORPHINE SULFATE 15 MG PO TABS: 15.0000 mg | ORAL_TABLET | Freq: Two times a day (BID) | ORAL | 0 refills | Status: DC | PRN

## 2018-09-15 NOTE — Progress Notes (Signed)
Subjective:    Patient ID: Robin Morgan, female    DOB: 1956-03-02, 63 y.o.   MRN: [id number]  HPI: Robin Morgan is a 63 y.o. female who returns for follow up appointment for chronic pain and medication refill. She states her pain is located in her neck radiating into her bilateral shoulders and upper and lower back pain. Also reports increase intensity and frequency of headaches, also states her last headache lasted 4 days, she denies migraines and denies any aura;s. She was instructed to keep a headache journal she verbalizes understanding, medication list and history was reviewed. She rates her pain 7. Her current exercise regime is walking and performing stretching exercises.  Robin Morgan Morphine equivalent is 30.00 MME.  Last UDS was Performed on 01/31/2018, it was consistent. UDS ordered today.   Pain Inventory Average Pain 8 Pain Right Now 7 My pain is constant, stabbing and aching  In the last 24 hours, has pain interfered with the following? General activity 7 Relation with others 7 Enjoyment of life 8 What TIME of day is your pain at its worst? night Sleep (in general) Fair  Pain is worse with: na Pain improves with: medication Relief from Meds: na  Mobility walk without assistance ability to climb steps?  yes do you drive?  yes  Function disabled: date disabled .  Neuro/Psych bladder control problems bowel control problems spasms  Prior Studies Any changes since last visit?  no  Physicians involved in your care Any changes since last visit?  no   Family History  Problem Relation Age of Onset  . Alzheimer's disease Mother   . Diabetes Father   . Heart disease Maternal Grandmother   . Breast cancer Paternal Grandmother 27   Social History   Socioeconomic History  . Marital status: Divorced    Spouse name: Not on file  . Number of children: 1  . Years of education: Not on file  . Highest education level: Not on file  Occupational History  .  Occupation: disabled  Social Needs  . Financial resource strain: Not on file  . Food insecurity:    Worry: Not on file    Inability: Not on file  . Transportation needs:    Medical: Not on file    Non-medical: Not on file  Tobacco Use  . Smoking status: Never Smoker  . Smokeless tobacco: Never Used  Substance and Sexual Activity  . Alcohol use: No  . Drug use: No  . Sexual activity: Not Currently  Lifestyle  . Physical activity:    Days per week: Not on file    Minutes per session: Not on file  . Stress: Not on file  Relationships  . Social connections:    Talks on phone: Not on file    Gets together: Not on file    Attends religious service: Not on file    Active member of club or organization: Not on file    Attends meetings of clubs or organizations: Not on file    Relationship status: Not on file  Other Topics Concern  . Not on file  Social History Narrative  . Not on file   Past Surgical History:  Procedure Laterality Date  . ABDOMINAL HYSTERECTOMY     1986  . APPENDECTOMY     1983  . BREAST EXCISIONAL BIOPSY Right 1990's  . BREAST SURGERY     rt breast cyst done in the 90's  . CHOLECYSTECTOMY    .  GALLBLADDER SURGERY     1983  . ROTATOR CUFF REPAIR     right  . SPINE SURGERY    . TONSILLECTOMY     1973   Past Medical History:  Diagnosis Date  . Calcific tendonitis   . Cervical spondylosis without myelopathy   . Depression   . Diabetes mellitus   . Esophageal stricture   . Fibromyalgia   . GERD (gastroesophageal reflux disease)   . Hiatal hernia   . Hyperlipidemia   . Hypertension    There were no vitals taken for this visit.  Opioid Risk Score:   Fall Risk Score:  `1  Depression screen PHQ 2/9  Depression screen [facility] 2/9 06/16/2018 03/07/2018 02/08/2018 12/07/2017 11/09/2017 10/27/2017 07/29/2017  Decreased Interest 0 1 0 0 0 3 3  Down, Depressed, Hopeless 0 1 0 0 0 3 3  PHQ - 2 Score 0 2 0 0 0 6 6  Altered sleeping 0 - 0 - 0 - -  Tired,  decreased energy 0 - 0 - 0 - -  Change in appetite 0 - 0 - 0 - -  Feeling bad or failure about yourself  0 - 0 - 0 - -  Trouble concentrating 0 - 0 - 0 - -  Moving slowly or fidgety/restless 0 - 0 - 0 - -  Suicidal thoughts 0 - 0 - 0 - -  PHQ-9 Score 0 - 0 - 0 - -  Difficult doing work/chores Not difficult at all - Not difficult at all - - - -    Review of Systems  Constitutional: Positive for unexpected weight change.  HENT: Negative.   Eyes: Negative.   Respiratory: Positive for apnea and shortness of breath.   Cardiovascular: Negative.   Gastrointestinal: Positive for abdominal pain and constipation.  Endocrine: Negative.   Genitourinary: Negative.   Musculoskeletal: Positive for arthralgias, back pain and myalgias.  Skin: Negative.   Allergic/Immunologic: Negative.   Neurological: Negative.   Hematological: Negative.   Psychiatric/Behavioral: Negative.   All other systems reviewed and are negative.      Objective:   Physical Exam Vitals signs and nursing note reviewed.  Constitutional:      Appearance: Normal appearance.  Neck:     Musculoskeletal: Normal range of motion and neck supple.  Cardiovascular:     Rate and Rhythm: Normal rate and regular rhythm.     Pulses: Normal pulses.     Heart sounds: Normal heart sounds.  Pulmonary:     Effort: Pulmonary effort is normal.     Breath sounds: Normal breath sounds.  Musculoskeletal:     Comments: Normal Muscle Bulk and Muscle Testing Reveals:  Upper Extremities: Full ROM and Muscle Strength 5/5 Bilateral AC Joint Tenderness Thoracic Paraspinal Tenderness: T-1-T-3 T-7-T-9 Lumbar Paraspinal Tenderness: L-4-L-5 Lower Extremities: Full ROM and Muscle Strength 5/5 Arises from chair with ease Narrow Based Gait   Skin:    General: Skin is warm and dry.  Neurological:     Mental Status: She is alert and oriented to person, place, and time.  Psychiatric:        Mood and Affect: Mood normal.        Behavior: Behavior  normal.           Assessment & Plan:  1. Fibromyalgia with myofascial pain: Continuehomeexercise programand heat therapy. Continuecurrent medication regimen withFlexeril. 09/15/2018 2. Rotator cuff syndrome on the right: Continue with stretching exercises and heat therapy. 09/15/2018 3. Cervicalgia. Post-laminectomy syndrome, facet  arthropathy:Cervical Radiculopathy: Continue current medication regimen withGabapentin. 09/15/2018 S/ P EMG with Dr. Naaman Plummer on 03/08/2017: Diagnosed with Moderate to Severe CTS of Left wrist:09/15/2018 4. Depression: Continuecurrent medication regimen withCymbalta. PCP Following. Current  dose 90 mg daily.09/15/2018 5. Mid/low back pain/ Lumbar Spondylosis: Continue Current Medication and stretching and heat therapy.09/15/2018 Refilled: Morphine Sulfate IR15 mg one tablet twice a day as needed for pain #60.  6. Lumbar Radiculopathy:Continuecurrent medication regimen withGabapentin. 09/15/2018 7.OA of right hand: Continue using Voltaren gel/ May substitute with Voltaren Tablet, she realizes she can't use both and verbalizes understanding. Continue withheat therapy. 09/15/2018 8. Migraines: Ms. Sannes states she has headaches and denies Migraines, denies aura's. Instructed to keep headache Journal.  Continue to Monitor 09/15/2018 9. Left CTS:S/P EMG on 03/08/2017: S/P Carpal Tunnel Release on 06/14/2017 via . Dr. Apolonio Schneiders.09/15/2018 10.. Muscle Spasm: Continue Flexeril. Continue to Monitor. 09/15/2018  20 minutes of face to face patient care time was spent during this visit. All questions were encouraged and answered.   F/U in 70month

## 2018-09-15 NOTE — Patient Instructions (Addendum)
Gabapentin : Change to two capsules ( 600 mg) 4 times a day , if you are able to tolerate this. We will change you to the 600 mg tablets .  Also keep a Headache journal, send me a my chart message in two weeks

## 2018-09-22 LAB — TOXASSURE SELECT,+ANTIDEPR,UR

## 2018-09-22 LAB — 6-ACETYLMORPHINE,TOXASSURE ADD
6-ACETYLMORPHINE: NEGATIVE
6-acetylmorphine: NOT DETECTED ng/mg creat

## 2018-09-29 ENCOUNTER — Telehealth: Payer: Self-pay | Admitting: *Deleted

## 2018-09-29 NOTE — Telephone Encounter (Signed)
Urine drug screen for this encounter is consistent for prescribed medication 

## 2018-10-11 ENCOUNTER — Encounter: Payer: Self-pay | Admitting: Physical Medicine & Rehabilitation

## 2018-10-11 ENCOUNTER — Encounter: Payer: Medicare HMO | Attending: Physical Medicine and Rehabilitation | Admitting: Physical Medicine & Rehabilitation

## 2018-10-11 VITALS — BP 115/70 | HR 99 | Ht 61.5 in | Wt 149.8 lb

## 2018-10-11 DIAGNOSIS — M609 Myositis, unspecified: Secondary | ICD-10-CM | POA: Diagnosis not present

## 2018-10-11 DIAGNOSIS — M961 Postlaminectomy syndrome, not elsewhere classified: Secondary | ICD-10-CM | POA: Insufficient documentation

## 2018-10-11 DIAGNOSIS — M797 Fibromyalgia: Secondary | ICD-10-CM

## 2018-10-11 DIAGNOSIS — M7918 Myalgia, other site: Secondary | ICD-10-CM | POA: Diagnosis not present

## 2018-10-11 DIAGNOSIS — E1142 Type 2 diabetes mellitus with diabetic polyneuropathy: Secondary | ICD-10-CM | POA: Diagnosis not present

## 2018-10-11 MED ORDER — TIZANIDINE HCL 2 MG PO TABS: 1.0000 mg | ORAL_TABLET | Freq: Four times a day (QID) | ORAL | 2 refills | Status: DC | PRN

## 2018-10-11 MED ORDER — MORPHINE SULFATE 15 MG PO TABS: 15.0000 mg | ORAL_TABLET | Freq: Two times a day (BID) | ORAL | 0 refills | Status: DC | PRN

## 2018-10-11 NOTE — Progress Notes (Signed)
Subjective:    Patient ID: Robin Morgan, female    DOB: 1955/08/11, 63 y.o.   MRN: [id number]  HPI   [name] is here in follow up of her chronic pain. She has ongoing pain in her neck and upper back with radiation at times into shoulders. She feels that her medication "wears off" by bedtime, and she seems to feel really tight when she tries to fall asleep. It is difficult to change positions once she lays down  Her mid back bothers her also, especially when she works during the day, does chores, etc. at her last visit, we discussed her myofascial pain at length.  She wanted to work on things on her own at home.  I asked her how much she is stretching and she essentially said that she is not doing it consistently.  She especially does not do well on days where she has more pain.  Currently for pain she is on gabapentin 3 times daily as well as Cymbalta.  For spasms she is using Flexeril 3 times a day as needed.  She is on MS immediate release 10 mg every 12 hours as needed   Pain Inventory Average Pain 8 Pain Right Now 7 My pain is constant, sharp and aching  In the last 24 hours, has pain interfered with the following? General activity 8 Relation with others 8 Enjoyment of life 8 What TIME of day is your pain at its worst? night Sleep (in general) Fair  Pain is worse with: bending and standing Pain improves with: . Relief from Meds: .  Mobility ability to climb steps?  yes do you drive?  yes  Function disabled: date disabled .  Neuro/Psych bladder control problems weakness spasms  Prior Studies Any changes since last visit?  no  Physicians involved in your care Any changes since last visit?  no   Family History  Problem Relation Age of Onset  . Alzheimer's disease Mother   . Diabetes Father   . Heart disease Maternal Grandmother   . Breast cancer Paternal Grandmother 53   Social History   Socioeconomic History  . Marital status: Divorced    Spouse name:  Not on file  . Number of children: 1  . Years of education: Not on file  . Highest education level: Not on file  Occupational History  . Occupation: disabled  Social Needs  . Financial resource strain: Not on file  . Food insecurity:    Worry: Not on file    Inability: Not on file  . Transportation needs:    Medical: Not on file    Non-medical: Not on file  Tobacco Use  . Smoking status: Never Smoker  . Smokeless tobacco: Never Used  Substance and Sexual Activity  . Alcohol use: No  . Drug use: No  . Sexual activity: Not Currently  Lifestyle  . Physical activity:    Days per week: Not on file    Minutes per session: Not on file  . Stress: Not on file  Relationships  . Social connections:    Talks on phone: Not on file    Gets together: Not on file    Attends religious service: Not on file    Active member of club or organization: Not on file    Attends meetings of clubs or organizations: Not on file    Relationship status: Not on file  Other Topics Concern  . Not on file  Social History Narrative  . Not  on file   Past Surgical History:  Procedure Laterality Date  . ABDOMINAL HYSTERECTOMY     1986  . APPENDECTOMY     1983  . BREAST EXCISIONAL BIOPSY Right 1990's  . BREAST SURGERY     rt breast cyst done in the 90's  . CHOLECYSTECTOMY    . GALLBLADDER SURGERY     1983  . ROTATOR CUFF REPAIR     right  . SPINE SURGERY    . TONSILLECTOMY     1973   Past Medical History:  Diagnosis Date  . Calcific tendonitis   . Cervical spondylosis without myelopathy   . Depression   . Diabetes mellitus   . Esophageal stricture   . Fibromyalgia   . GERD (gastroesophageal reflux disease)   . Hiatal hernia   . Hyperlipidemia   . Hypertension    BP 115/70   Pulse 99   Ht 5' 1.5" (1.562 m)   Wt 149 lb 12.8 oz (67.9 kg)   SpO2 94%   BMI 27.85 kg/m   Opioid Risk Score:   Fall Risk Score:  `1  Depression screen PHQ 2/9  Depression screen [facility] 2/9 06/16/2018  03/07/2018 02/08/2018 12/07/2017 11/09/2017 10/27/2017 07/29/2017  Decreased Interest 0 1 0 0 0 3 3  Down, Depressed, Hopeless 0 1 0 0 0 3 3  PHQ - 2 Score 0 2 0 0 0 6 6  Altered sleeping 0 - 0 - 0 - -  Tired, decreased energy 0 - 0 - 0 - -  Change in appetite 0 - 0 - 0 - -  Feeling bad or failure about yourself  0 - 0 - 0 - -  Trouble concentrating 0 - 0 - 0 - -  Moving slowly or fidgety/restless 0 - 0 - 0 - -  Suicidal thoughts 0 - 0 - 0 - -  PHQ-9 Score 0 - 0 - 0 - -  Difficult doing work/chores Not difficult at all - Not difficult at all - - - -     Review of Systems  Constitutional: Negative.   HENT: Negative.   Eyes: Negative.   Respiratory: Positive for apnea and shortness of breath.   Cardiovascular: Negative.   Gastrointestinal: Positive for abdominal pain and constipation.  Endocrine: Negative.   Genitourinary: Positive for difficulty urinating.  Musculoskeletal: Positive for arthralgias, back pain and myalgias.  Skin: Negative.   Allergic/Immunologic: Negative.   Neurological: Positive for weakness.  Hematological: Negative.   Psychiatric/Behavioral: Negative.   All other systems reviewed and are negative.      Objective:   Physical Exam General: No acute distress HEENT: EOMI, oral membranes moist Cards: reg rate  Chest: normal effort Abdomen: Soft, NT, ND Skin: dry, intact Extremities: no edema Musculoskeletal: Head forward posture. Traps and neck are very tight. limited ROM of bilateral shoulders during scapular mobilization. Neck range of motion limited as well.Low back with generalized tenderness to palpation.  Neurological: She isalertand oriented to person, place, and time.  Skin: Skin iswarmand dry.  Psychiatric:pleasant   Assessment & Plan:  1. Fibromyalgia: Continue exercise and heat therapy.Maintain physical activity to tolerance.  2. Rotator cuff syndrome on the right>left:continue rotator cuff exercises, and scapular ROM exercises  as she's been doing.  3. Cervicalgia. Post-laminectomy syndrome, facet arthropathy: Cervical Radiculopathy: Continue Gabapentin.               -MS IR 15mg  q12 prn #60. RF today  -We will continue the controlled substance monitoring program,  this consists of regular clinic visits, examinations, routine drug screening, pill counts as well as use of [place] Controlled Substance Reporting System. [place] today.    -Medication was refilled and a second prescription was sent to the patient's pharmacy for next month.   4. Depression: Continue Cymbalta. PCP increase dose to 90 mg daily. 5. Mid/low back pain/shoulder girdle pain: right neck and shoulder girdle pain appears largely myofascial.  -she is tighter than ever on exam today  -made referral to outpt PT to address shoulder rom, modalities and to establish HEP  -trial of tizanidine to replace flexeril for muscle spasm, start at 1mg  q6 prn 6. Lumbar Radiculopathy: see above, maintain HEP 7OA of right hand:  Voltaren gel and heat therapy.          -CONTINUE oral Voltaren 50 mg twice daily with food. . 8. Migraines: stable, largely driven by shoulder girdle and neck pain  9. Left CTS:S/P EMG on 03/08/2017: S/P Carpal Tunnel Release on 06/14/2017 via . Dr. Apolonio Schneiders.    15 minutes of face to face patient care time was spent during this visit. All questions were encouraged and answered.. Follow up in 2 months with NP

## 2018-10-11 NOTE — Patient Instructions (Signed)
PLEASE FEEL FREE TO CALL OUR OFFICE WITH ANY PROBLEMS OR QUESTIONS ([phone])      

## 2018-10-12 ENCOUNTER — Other Ambulatory Visit: Payer: Self-pay | Admitting: *Deleted

## 2018-10-12 MED ORDER — LANTUS SOLOSTAR 100 UNIT/ML ~~LOC~~ SOPN: 64.0000 [IU] | PEN_INJECTOR | Freq: Every day | SUBCUTANEOUS | 0 refills | Status: DC

## 2018-10-14 ENCOUNTER — Encounter: Payer: Self-pay | Admitting: Family Medicine

## 2018-10-14 ENCOUNTER — Ambulatory Visit (INDEPENDENT_AMBULATORY_CARE_PROVIDER_SITE_OTHER): Payer: Medicare HMO | Admitting: Family Medicine

## 2018-10-14 VITALS — BP 112/66 | HR 89 | Temp 98.0°F | Resp 16 | Ht 61.0 in | Wt 149.8 lb

## 2018-10-14 DIAGNOSIS — I10 Essential (primary) hypertension: Secondary | ICD-10-CM | POA: Diagnosis not present

## 2018-10-14 DIAGNOSIS — Z794 Long term (current) use of insulin: Secondary | ICD-10-CM

## 2018-10-14 DIAGNOSIS — E782 Mixed hyperlipidemia: Secondary | ICD-10-CM | POA: Diagnosis not present

## 2018-10-14 DIAGNOSIS — G4733 Obstructive sleep apnea (adult) (pediatric): Secondary | ICD-10-CM | POA: Diagnosis not present

## 2018-10-14 DIAGNOSIS — E1140 Type 2 diabetes mellitus with diabetic neuropathy, unspecified: Secondary | ICD-10-CM | POA: Diagnosis not present

## 2018-10-14 LAB — POCT GLYCOSYLATED HEMOGLOBIN (HGB A1C): Hemoglobin A1C: 6.9 % — AB (ref 4.0–5.6)

## 2018-10-14 MED ORDER — PNEUMOCOCCAL VAC POLYVALENT 25 MCG/0.5ML IJ INJ: 0.5000 mL | INJECTION | INTRAMUSCULAR | 0 refills | Status: AC

## 2018-10-14 NOTE — Patient Instructions (Signed)
Please return in 3 months for diabetes follow up  Please start checking your sugars every morning, and then 2 ours after lunch or dinner or before bedtime.   If you have any questions or concerns, please don't hesitate to send me a message via MyChart or call the office at [phone]. Thank you for visiting with [place] today! It's our pleasure caring for you.

## 2018-10-14 NOTE — Addendum Note (Signed)
Addended by: Layla Barter on: 10/14/2018 02:43 PM   Modules accepted: Orders

## 2018-10-14 NOTE — Progress Notes (Signed)
Subjective  CC:  Chief Complaint  Patient presents with  . Elevated blood sugars    280s 2-3 hours after eating    HPI: Robin Morgan is a 63 y.o. female who presents to the office today for follow up of diabetes and problems listed above in the chief complaint.   Diabetes follow up: Her diabetic control is reported as Doing worse..  Over the last 2 days she has had 2 elevated postprandial readings.  The Morgan was after eating chicken noodle soup with a half bowl of rice.  She cannot remember what she ate for the other one.  Prior to that she reports when she checks her fastings they would be 120s and her postprandials are 140s to 150s.  She has felt well.  She denies symptoms of infection.  No fevers, URI symptoms although she does have allergy symptoms with sneezing and some postnasal drainage with cough.  She denies sinus pain.  No abdominal pain, still working on treating her constipation.  No urinary symptoms. She denies exertional CP or SOB or symptomatic hypoglycemia. She denies foot sores or paresthesias.  At last visit we increased her Lantus by a few units.  No other changes were made at that time.  She denies recent steroid injections.  She does admit to chronic pain that has been worse and she has been a little bit emotional regarding that.  Blood pressures control has been very well.  Weight is stable.  She continues her medications without adverse effects.  Lipids were well controlled when last checked.  Tolerating statin.  No dose changes were made. Wt Readings from Last 3 Encounters:  10/14/18 149 lb 12.8 oz (67.9 kg)  10/11/18 149 lb 12.8 oz (67.9 kg)  09/15/18 148 lb (67.1 kg)    BP Readings from Last 3 Encounters:  10/14/18 112/66  10/11/18 115/70  09/15/18 115/70    Assessment  1. Controlled type 2 diabetes mellitus with diabetic neuropathy, with long-term current use of insulin ([place])   2. Essential hypertension   3. Mixed hyperlipidemia      Plan    Diabetes is currently well controlled.  Elevated blood sugar readings may be due to diet changes.  No sign or symptom of infection at this time.  Patient will monitor herself closely over the next week or so.  She will increase checking her sugars.  Check fastings and 2-hour postprandials.  Monitor for hypoglycemia and rebound hyperglycemia.  If abnormal readings persist, she will return for recheck.  No changes in medications made today.  Hypertension is well controlled.  Hyperlipidemia on statin.  Follow up: Return in about 3 months (around 01/14/2019) for follow up of diabetes and hypertension.. Orders Placed This Encounter  Procedures  . POCT glycosylated hemoglobin (Hb A1C)   No orders of the defined types were placed in this encounter.     Immunization History  Administered Date(s) Administered  . Influenza Split 06/04/2009, 05/02/2012  . Influenza, Quadrivalent, Recombinant, Inj, Pf 05/12/2016, 07/07/2017  . Influenza, Seasonal, Injecte, Preservative Fre 06/06/2014, 04/24/2015  . Influenza,inj,Quad PF,6+ Mos 05/05/2018  . Influenza-Unspecified 09/03/2011, 04/19/2013  . Pneumococcal Polysaccharide-23 08/29/2009  . Tdap 05/11/2012    Diabetes Related Lab Review: Lab Results  Component Value Date   HGBA1C 6.9 (A) 10/14/2018   HGBA1C 7.0 (A) 06/16/2018   HGBA1C 6.7 (A) 02/08/2018    Lab Results  Component Value Date   MICROALBUR 1.8 06/16/2018   Lab Results  Component Value Date  CREATININE 0.81 09/02/2018   BUN 14 09/02/2018   NA 143 09/02/2018   K 4.3 09/02/2018   CL 106 09/02/2018   CO2 28 09/02/2018   Lab Results  Component Value Date   CHOL 132 06/16/2018   Lab Results  Component Value Date   HDL 39.90 06/16/2018   Lab Results  Component Value Date   LDLCALC 68 06/16/2018   Lab Results  Component Value Date   TRIG 120.0 06/16/2018   Lab Results  Component Value Date   CHOLHDL 3 06/16/2018   No results found for: LDLDIRECT The 10-year ASCVD  risk score Mikey Bussing DC Jr., et al., 2013) is: 10%   Values used to calculate the score:     Age: 71 years     Sex: Female     Is Non-Hispanic African American: No     Diabetic: Yes     Tobacco smoker: No     Systolic Blood Pressure: 801 mmHg     Is BP treated: Yes     HDL Cholesterol: 39.9 mg/dL     Total Cholesterol: 132 mg/dL I have reviewed the PMH, Fam and Soc history. Patient Active Problem List   Diagnosis Date Noted  . Diabetic peripheral neuropathy associated with type 2 diabetes mellitus ([place]) 01/06/2017    Priority: High  . OSA (obstructive sleep apnea) 03/10/2016    Priority: High  . Controlled diabetes mellitus with diabetic neuropathy, with long-term current use of insulin ([place]) 10/11/2014    Priority: High  . HTN (hypertension) 10/11/2014    Priority: High  . Hyperlipidemia 10/11/2014    Priority: High  . DDD (degenerative disc disease), lumbar 01/15/2014    Priority: High  . Hypothyroidism, acquired 06/12/2013    Priority: High  . Cervical post-laminectomy syndrome 05/10/2013    Priority: High  . Chronic pain associated with significant psychosocial dysfunction 01/03/2010    Priority: High    Overview:  Chronic Pain Syndrome, Dr. Earl Lites  Overview:  Overview:  Overview:  Chronic Pain Syndrome, Dr. Earl Lites   . Esophageal stricture     Priority: Medium  . Tenosynovitis, wrist 10/07/2017    Priority: Medium  . Trigger thumb of left hand 10/07/2017    Priority: Medium  . Laryngopharyngeal reflux (LPR) 08/26/2017    Priority: Medium  . Dysphagia 08/26/2017    Priority: Medium  . Left carpal tunnel syndrome 03/08/2017    Priority: Medium  . Fibromyalgia 02/12/2016    Priority: Medium  . Osteopenia 12/01/2012    Priority: Medium    10/2017: osteopenia: T = -1.9; stable. Recheck 2 years T = -1.2 at hip, -1.8 at spine, 4/ 2014   . Chronic diarrhea 07/04/2012    Priority: Medium    Overview:  ? Related to gallbladder surgery. Has been evaluated  by Dr. Amedeo Plenty [facility]. Lomotil as needed.   . Chronic prescription opiate use 09/02/2018    Social History: Patient  reports that she has never smoked. She has never used smokeless tobacco. She reports that she does not drink alcohol or use drugs.  Review of Systems: Ophthalmic: negative for eye pain, loss of vision or double vision Cardiovascular: negative for chest pain Respiratory: negative for SOB or persistent cough Gastrointestinal: negative for abdominal pain Genitourinary: negative for dysuria or gross hematuria MSK: negative for foot lesions Neurologic: negative for weakness or gait disturbance  Objective  Vitals: BP 112/66   Pulse 89   Temp 98 F (36.7 C) (Oral)   Resp 16  Ht 5\' 1"  (1.549 m)   Wt 149 lb 12.8 oz (67.9 kg)   SpO2 97%   BMI 28.30 kg/m  General: well appearing, no acute distress  Psych:  Alert and oriented, normal mood and affect HEENT:  Normocephalic, atraumatic, moist mucous membranes, supple neck  Cardiovascular:  Nl S1 and S2, RRR without murmur, gallop or rub. no edema Respiratory:  Good breath sounds bilaterally, CTAB with normal effort, no rales Gastrointestinal: normal BS, soft, nontender, no suprapubic tenderness     Diabetic education: ongoing education regarding chronic disease management for diabetes was given today. We continue to reinforce the ABC's of diabetic management: A1c (<7 or 8 dependent upon patient), tight blood pressure control, and cholesterol management with goal LDL < 100 minimally. We discuss diet strategies, exercise recommendations, medication options and possible side effects. At each visit, we review recommended immunizations and preventive care recommendations for diabetics and stress that good diabetic control can prevent other problems. See below for this patient's data.    Commons side effects, risks, benefits, and alternatives for medications and treatment plan prescribed today were discussed, and the patient expressed  understanding of the given instructions. Patient is instructed to call or message via MyChart if he/she has any questions or concerns regarding our treatment plan. No barriers to understanding were identified. We discussed Red Flag symptoms and signs in detail. Patient expressed understanding regarding what to do in case of urgent or emergency type symptoms.   Medication list was reconciled, printed and provided to the patient in AVS. Patient instructions and summary information was reviewed with the patient as documented in the AVS. This note was prepared with assistance of Dragon voice recognition software. Occasional wrong-word or sound-a-like substitutions may have occurred due to the inherent limitations of voice recognition software

## 2018-10-17 ENCOUNTER — Other Ambulatory Visit: Payer: Self-pay | Admitting: Family Medicine

## 2018-10-17 NOTE — Telephone Encounter (Signed)
Last OV: 10/14/18 Last Fill: 03/03/18 #30 5 ref

## 2018-10-27 ENCOUNTER — Telehealth: Payer: Self-pay | Admitting: Registered Nurse

## 2018-10-27 DIAGNOSIS — G5602 Carpal tunnel syndrome, left upper limb: Secondary | ICD-10-CM

## 2018-10-27 DIAGNOSIS — M797 Fibromyalgia: Secondary | ICD-10-CM

## 2018-10-27 MED ORDER — GABAPENTIN 300 MG PO CAPS: ORAL_CAPSULE | ORAL | 3 refills | Status: DC

## 2018-10-27 NOTE — Telephone Encounter (Signed)
Return Robin Morgan message via My Chart: Medication Refill: Gabapentin: sent to her pharmacy, she is aware of the above.

## 2018-11-16 DIAGNOSIS — G4733 Obstructive sleep apnea (adult) (pediatric): Secondary | ICD-10-CM | POA: Diagnosis not present

## 2018-11-17 ENCOUNTER — Encounter: Payer: Self-pay | Admitting: Family Medicine

## 2018-11-22 NOTE — Telephone Encounter (Signed)
Please call to schedule a virtual visit. Thank you!

## 2018-11-23 ENCOUNTER — Ambulatory Visit (INDEPENDENT_AMBULATORY_CARE_PROVIDER_SITE_OTHER): Payer: Medicare HMO | Admitting: Family Medicine

## 2018-11-23 ENCOUNTER — Other Ambulatory Visit: Payer: Self-pay

## 2018-11-23 ENCOUNTER — Encounter: Payer: Self-pay | Admitting: Family Medicine

## 2018-11-23 DIAGNOSIS — M797 Fibromyalgia: Secondary | ICD-10-CM

## 2018-11-23 DIAGNOSIS — R1032 Left lower quadrant pain: Secondary | ICD-10-CM

## 2018-11-23 DIAGNOSIS — K5903 Drug induced constipation: Secondary | ICD-10-CM

## 2018-11-23 NOTE — Progress Notes (Signed)
I have discussed the procedure for the virtual visit with the patient who has given consent to proceed with assessment and treatment.   Tiara S Simmons, CMA     

## 2018-11-23 NOTE — Progress Notes (Signed)
Subjective  CC:  Chief Complaint  Patient presents with  . Abdominal Pain    Symptoms started about 3 weeks ago.. Left lower side, she has not taken any additional pain medication. She is having regular BMs and they alternate from pasty and loose.. C/o some nausea, fatigue, and loss of taste    HPI: Robin Morgan is a 63 y.o. female who presents to the office today to address the problems listed above in the chief complaint.  Robin Morgan complains of persistent and mildly worsening left lower abdominal pain.  [name] for this back in January.  At that time is been going on for about a week.  Initial evaluation showed a benign abdomen with normal CBC and CMP.  KUB showed moderate amount of stool burden.  I recommended a stool softener and MiraLAX.  She reports that she had mild results but stool has become pasty from the [place].  She has had some leaking in her undergarments.  She denies large volume of bowel movements in spite of those medications.  She has not tried anything else.  She is on chronic narcotics due to fibromyalgia pain.  Reports increased pain after meals.  She denies fevers, chills, blood in the stool or mucus.  She does have a GI doctor.  Endoscopy and colonoscopy last year that were unremarkable except for colonic polyp.  She denies history of diverticular disease.  Appetite is normal.  However over the last 2 weeks she is felt more tired with mild cough and decrease sense of smell and taste.  She denies exposure COVID-19.  No fevers or chills.  No shortness of breath.  Assessment  1. Left lower quadrant abdominal pain   2. Drug induced constipation   3. Fibromyalgia      Plan   Left lower quadrant abdominal pain: Could be related to drug-induced constipation however need to rule out other problems as it is progressive.  Recommend CT scan, trial of laxative, and follow-up with GI.  Patient's pain is mild at this time.  If worsens or becomes severe, she will  need urgent evaluation.  Discussed red flag symptoms of hematemesis, melena or bright red blood per rectum.  Follow up: Return in about 8 weeks (around 01/18/2019) for follow up of diabetes and hypertension.    Orders Placed This Encounter  Procedures  . CT Abdomen Pelvis W Contrast   No orders of the defined types were placed in this encounter.     I reviewed the patients updated PMH, FH, and SocHx.    Patient Active Problem List   Diagnosis Date Noted  . Diabetic peripheral neuropathy associated with type 2 diabetes mellitus ([place]) 01/06/2017    Priority: High  . OSA (obstructive sleep apnea) 03/10/2016    Priority: High  . Controlled diabetes mellitus with diabetic neuropathy, with long-term current use of insulin ([place]) 10/11/2014    Priority: High  . HTN (hypertension) 10/11/2014    Priority: High  . Hyperlipidemia 10/11/2014    Priority: High  . DDD (degenerative disc disease), lumbar 01/15/2014    Priority: High  . Hypothyroidism, acquired 06/12/2013    Priority: High  . Cervical post-laminectomy syndrome 05/10/2013    Priority: High  . Chronic pain associated with significant psychosocial dysfunction 01/03/2010    Priority: High  . Esophageal stricture     Priority: Medium  . Tenosynovitis, wrist 10/07/2017    Priority: Medium  . Trigger thumb of left hand 10/07/2017  Priority: Medium  . Laryngopharyngeal reflux (LPR) 08/26/2017    Priority: Medium  . Dysphagia 08/26/2017    Priority: Medium  . Left carpal tunnel syndrome 03/08/2017    Priority: Medium  . Fibromyalgia 02/12/2016    Priority: Medium  . Osteopenia 12/01/2012    Priority: Medium  . Chronic diarrhea 07/04/2012    Priority: Medium  . Chronic prescription opiate use 09/02/2018   Current Meds  Medication Sig  . atorvastatin (LIPITOR) 40 MG tablet Take 1 tablet (40 mg total) by mouth daily.  Marland Kitchen b complex vitamins tablet Take 1 tablet by mouth daily.  . Calcium Carbonate-Vitamin D  (CALCIUM-VITAMIN D) 500-200 MG-UNIT per tablet Take 1 tablet by mouth 2 (two) times daily with a meal.  . co-enzyme Q-10 30 MG capsule Take 30 mg by mouth 3 (three) times daily.  . diclofenac sodium (VOLTAREN) 1 % GEL Apply 2 g topically 4 (four) times daily.  . diphenoxylate-atropine (LOMOTIL) 2.5-0.025 MG tablet Take 1 tablet by mouth 4 (four) times daily as needed for diarrhea or loose stools.  . DULoxetine (CYMBALTA) 30 MG capsule Take 3 capsules (90 mg total) by mouth daily.  Marland Kitchen esomeprazole (NEXIUM) 40 MG capsule Take 1 capsule (40 mg total) by mouth 2 (two) times daily before a meal. Morning and evening  . fenofibrate 160 MG tablet Take 1 tablet (160 mg total) by mouth daily.  Marland Kitchen gabapentin (NEURONTIN) 300 MG capsule TAKE 2 CAPSULES IN THE MORNING, 2 CAPSULES AT LUNCH AND 2 capsules  AT BEDTIME  . gentamicin ointment (GARAMYCIN) 0.1 % Apply 1 application topically 3 (three) times daily.  Marland Kitchen LANTUS SOLOSTAR 100 UNIT/ML Solostar Pen Inject 64 Units into the skin daily.  Marland Kitchen levothyroxine (SYNTHROID, LEVOTHROID) 88 MCG tablet Take 1 tablet (88 mcg total) by mouth daily before breakfast.  . lisinopril (PRINIVIL,ZESTRIL) 10 MG tablet Take 1 tablet (10 mg total) by mouth daily.  . magnesium gluconate (MAGONATE) 500 MG tablet Take 1 tablet (500 mg total) by mouth daily.  . metFORMIN (GLUCOPHAGE) 1000 MG tablet Take 1 tablet by mouth 2 (two) times daily.  Marland Kitchen morphine (MSIR) 15 MG tablet Take 1 tablet (15 mg total) by mouth 2 (two) times daily as needed for moderate pain.  . Omega-3 Fatty Acids (FISH OIL) 1000 MG CAPS Take 2 capsules by mouth daily.  Marland Kitchen tiZANidine (ZANAFLEX) 2 MG tablet Take 0.5-1 tablets (1-2 mg total) by mouth every 6 (six) hours as needed for muscle spasms.  Marland Kitchen triamcinolone cream (KENALOG) 0.1 % Apply 1 application topically 2 (two) times daily. As needed  . zolpidem (AMBIEN) 10 MG tablet TAKE ONE TABLET BY MOUTH AT BEDTIME AS NEEDED FOR TAKE 1 TABLET BY MOUTH AT BEDTIME AS NEEDED FOR  SLEEP    Allergies: Patient is allergic to compazine; prochlorperazine; and naproxen. Family History: Patient family history includes Alzheimer's disease in her mother; Breast cancer (age of onset: 77) in her paternal grandmother; Diabetes in her father; Heart disease in her maternal grandmother. Social History:  Patient  reports that she has never smoked. She has never used smokeless tobacco. She reports that she does not drink alcohol or use drugs.  Review of Systems: Constitutional: Negative for fever malaise or anorexia Cardiovascular: negative for chest pain Respiratory: negative for SOB or persistent cough Gastrointestinal: Positive for abdominal pain Endocrine: Negative for hyperglycemia or hypoglycemia  Objective  Vitals: There were no vitals taken for this visit.  Patient reports she is afebrile, 97.1 today. General: no acute distress ,  A&Ox3, sitting in chair comfortable HEENT: PEERL, conjunctiva normal, Oropharynx moist,neck is supple Abdomen: Points to left lower quadrant skin:  Warm, no rashes  No visits with results within 1 Day(s) from this visit.  Latest known visit with results is:  Office Visit on 10/14/2018  Component Date Value Ref Range Status  . Hemoglobin A1C 10/14/2018 6.9* 4.0 - 5.6 % Final   Lab Results  Component Value Date   ALT 23 09/02/2018   AST 29 09/02/2018   ALKPHOS 62 09/02/2018   BILITOT 0.4 09/02/2018   Lab Results  Component Value Date   CREATININE 0.81 09/02/2018   BUN 14 09/02/2018   NA 143 09/02/2018   K 4.3 09/02/2018   CL 106 09/02/2018   CO2 28 09/02/2018   Lab Results  Component Value Date   WBC 8.2 09/02/2018   HGB 12.2 09/02/2018   HCT 39.0 09/02/2018   MCV 84.2 09/02/2018   PLT 398.0 09/02/2018      Commons side effects, risks, benefits, and alternatives for medications and treatment plan prescribed today were discussed, and the patient expressed understanding of the given instructions. Patient is instructed to  call or message via MyChart if he/she has any questions or concerns regarding our treatment plan. No barriers to understanding were identified. We discussed Red Flag symptoms and signs in detail. Patient expressed understanding regarding what to do in case of urgent or emergency type symptoms.   Medication list was reconciled, printed and provided to the patient in AVS. Patient instructions and summary information was reviewed with the patient as documented in the AVS. This note was prepared with assistance of Dragon voice recognition software. Occasional wrong-word or sound-a-like substitutions may have occurred due to the inherent limitations of voice recognition software

## 2018-11-23 NOTE — Patient Instructions (Signed)
Please call Dr. Erlinda Hong office for evaluation. Try using a laxative to help the constipation.  We will call you about an appointment for your abdominal pelvic CT scan to assess your pain.   If you have any questions or concerns, please don't hesitate to send me a message via MyChart or call the office at [phone]. Thank you for visiting with [place] today! It's our pleasure caring for you.

## 2018-11-24 ENCOUNTER — Encounter: Payer: Self-pay | Admitting: Family Medicine

## 2018-11-24 MED ORDER — LANTUS SOLOSTAR 100 UNIT/ML ~~LOC~~ SOPN: 64.0000 [IU] | PEN_INJECTOR | Freq: Every day | SUBCUTANEOUS | 2 refills | Status: DC

## 2018-11-29 ENCOUNTER — Other Ambulatory Visit: Payer: Self-pay

## 2018-11-29 ENCOUNTER — Ambulatory Visit
Admission: RE | Admit: 2018-11-29 | Discharge: 2018-11-29 | Disposition: A | Discharge status: Home / Self Care | Payer: Medicare HMO | Source: Ambulatory Visit | Attending: Family Medicine | Admitting: Family Medicine

## 2018-11-29 DIAGNOSIS — R1032 Left lower quadrant pain: Secondary | ICD-10-CM

## 2018-11-29 DIAGNOSIS — R109 Unspecified abdominal pain: Secondary | ICD-10-CM | POA: Diagnosis not present

## 2018-11-29 MED ORDER — IOPAMIDOL (ISOVUE-300) INJECTION 61%
100.0000 mL | Freq: Once | INTRAVENOUS | Status: AC | PRN
  Administered 2018-11-29: 100 mL via INTRAVENOUS

## 2018-11-30 ENCOUNTER — Other Ambulatory Visit: Payer: Self-pay | Admitting: *Deleted

## 2018-11-30 DIAGNOSIS — K5903 Drug induced constipation: Secondary | ICD-10-CM

## 2018-11-30 NOTE — Progress Notes (Signed)
Please call patient: I have reviewed his/her lab results. CT scan is completely normal. There are no worrisome findings. Her pain may be related to constipation. Please f/u with GI for further recommendations and continue treatment for constipation.

## 2018-12-06 ENCOUNTER — Encounter: Payer: Self-pay | Admitting: Family Medicine

## 2018-12-06 ENCOUNTER — Encounter: Payer: Self-pay | Admitting: Physical Medicine & Rehabilitation

## 2018-12-06 ENCOUNTER — Other Ambulatory Visit: Payer: Self-pay

## 2018-12-06 ENCOUNTER — Encounter: Payer: Medicare HMO | Attending: Physical Medicine and Rehabilitation | Admitting: Physical Medicine & Rehabilitation

## 2018-12-06 VITALS — Ht 63.0 in | Wt 149.0 lb

## 2018-12-06 DIAGNOSIS — M797 Fibromyalgia: Secondary | ICD-10-CM

## 2018-12-06 DIAGNOSIS — M7918 Myalgia, other site: Secondary | ICD-10-CM | POA: Diagnosis not present

## 2018-12-06 DIAGNOSIS — K5903 Drug induced constipation: Secondary | ICD-10-CM | POA: Diagnosis not present

## 2018-12-06 DIAGNOSIS — M961 Postlaminectomy syndrome, not elsewhere classified: Secondary | ICD-10-CM | POA: Insufficient documentation

## 2018-12-06 DIAGNOSIS — E1142 Type 2 diabetes mellitus with diabetic polyneuropathy: Secondary | ICD-10-CM | POA: Diagnosis not present

## 2018-12-06 DIAGNOSIS — M609 Myositis, unspecified: Secondary | ICD-10-CM | POA: Insufficient documentation

## 2018-12-06 DIAGNOSIS — T402X5A Adverse effect of other opioids, initial encounter: Secondary | ICD-10-CM

## 2018-12-06 MED ORDER — MORPHINE SULFATE 15 MG PO TABS: 15.0000 mg | ORAL_TABLET | Freq: Two times a day (BID) | ORAL | 0 refills | Status: DC | PRN

## 2018-12-06 MED ORDER — NALOXEGOL OXALATE 25 MG PO TABS: 25.0000 mg | ORAL_TABLET | Freq: Every day | ORAL | 4 refills | Status: DC

## 2018-12-06 NOTE — Progress Notes (Signed)
Subjective:    Patient ID: Robin Morgan, female    DOB: June 03, 1956, 63 y.o.   MRN: [id number]  HPI  Due to national recommendations of social distancing because of COVID 82, an audio/video tele-health visit is felt to be the most appropriate encounter for this patient at this time. See MyChart message from today for the patient's consent to a tele-health encounter with [place]. This is a follow up telephone visit for the patient who is at home. MD is at office.    I am meeting with the patient today regarding her chronic pain. She has had ongoing pain in her  Mid back to her shoulders. It bothers her typically when she is standing for more than 5-10 minutes such as when she is performing chores in house. Tizanidine helped slightly. She continues on gabapentin as well cymbalta, and MS IR for pain  She has had ongoing issues with constipation. It is typically several days between bm's. She has tried over the counter remedies without a lot of results. Her primary orderd xray/CT which demonstrated significant stool in colon. A GI referral was made    Pain Inventory Average Pain 7 Pain Right Now 6 My pain is constant and aching  In the last 24 hours, has pain interfered with the following? General activity 9 Relation with others 9 Enjoyment of life 8 What TIME of day is your pain at its worst? all Sleep (in general) Fair  Pain is worse with: standing Pain improves with: heat/ice and medication Relief from Meds: 9  Mobility walk without assistance ability to climb steps?  yes do you drive?  yes  Function disabled: date disabled .  Neuro/Psych bladder control problems bowel control problems  Prior Studies Any changes since last visit?  no  Physicians involved in your care Any changes since last visit?  no   Family History  Problem Relation Age of Onset  . Alzheimer's disease Mother   . Diabetes Father   . Heart disease Maternal  Grandmother   . Breast cancer Paternal Grandmother 56   Social History   Socioeconomic History  . Marital status: Divorced    Spouse name: Not on file  . Number of children: 1  . Years of education: Not on file  . Highest education level: Not on file  Occupational History  . Occupation: disabled  Social Needs  . Financial resource strain: Not on file  . Food insecurity:    Worry: Not on file    Inability: Not on file  . Transportation needs:    Medical: Not on file    Non-medical: Not on file  Tobacco Use  . Smoking status: Never Smoker  . Smokeless tobacco: Never Used  Substance and Sexual Activity  . Alcohol use: No  . Drug use: No  . Sexual activity: Not Currently  Lifestyle  . Physical activity:    Days per week: Not on file    Minutes per session: Not on file  . Stress: Not on file  Relationships  . Social connections:    Talks on phone: Not on file    Gets together: Not on file    Attends religious service: Not on file    Active member of club or organization: Not on file    Attends meetings of clubs or organizations: Not on file    Relationship status: Not on file  Other Topics Concern  . Not on file  Social History Narrative  .  Not on file   Past Surgical History:  Procedure Laterality Date  . ABDOMINAL HYSTERECTOMY     1986  . APPENDECTOMY     1983  . BREAST EXCISIONAL BIOPSY Right 1990's  . BREAST SURGERY     rt breast cyst done in the 90's  . CHOLECYSTECTOMY    . GALLBLADDER SURGERY     1983  . ROTATOR CUFF REPAIR     right  . SPINE SURGERY    . TONSILLECTOMY     1973   Past Medical History:  Diagnosis Date  . Calcific tendonitis   . Cervical spondylosis without myelopathy   . Depression   . Diabetes mellitus   . Esophageal stricture   . Fibromyalgia   . GERD (gastroesophageal reflux disease)   . Hiatal hernia   . Hyperlipidemia   . Hypertension    Ht 5\' 3"  (1.6 m)   Wt 149 lb (67.6 kg)   BMI 26.39 kg/m   Opioid Risk Score:    Fall Risk Score:  `1  Depression screen PHQ 2/9  Depression screen [facility] 2/9 06/16/2018 03/07/2018 02/08/2018 12/07/2017 11/09/2017 10/27/2017 07/29/2017  Decreased Interest 0 1 0 0 0 3 3  Down, Depressed, Hopeless 0 1 0 0 0 3 3  PHQ - 2 Score 0 2 0 0 0 6 6  Altered sleeping 0 - 0 - 0 - -  Tired, decreased energy 0 - 0 - 0 - -  Change in appetite 0 - 0 - 0 - -  Feeling bad or failure about yourself  0 - 0 - 0 - -  Trouble concentrating 0 - 0 - 0 - -  Moving slowly or fidgety/restless 0 - 0 - 0 - -  Suicidal thoughts 0 - 0 - 0 - -  PHQ-9 Score 0 - 0 - 0 - -  Difficult doing work/chores Not difficult at all - Not difficult at all - - - -    Review of Systems  Constitutional: Negative.   HENT: Negative.   Eyes: Negative.   Respiratory: Positive for apnea.   Gastrointestinal: Positive for abdominal pain and constipation.  Endocrine: Negative.   Genitourinary: Positive for urgency.  Musculoskeletal: Positive for back pain.  Skin: Negative.   Allergic/Immunologic: Negative.   Psychiatric/Behavioral: Positive for dysphoric mood.              Assessment & Plan:  1. Fibromyalgia: Continue exercise and heat therapy.Maintain physical activity to tolerance.  2. Rotator cuff syndrome on the right>left:continue rotator cuff exercises, and scapular ROM exercises as she's been doing.  3. Cervicalgia. Post-laminectomy syndrome, facet arthropathy: Cervical Radiculopathy: Continue Gabapentin.            -MS IR 15mg  q12 prn #60. RF today        -We will continue the controlled substance monitoring program, this consists of regular clinic visits, examinations, routine drug screening, pill counts as well as use of [place] Controlled Substance Reporting System. [place] today.           -Medication was refilled and a second prescription was sent to the patient's pharmacy for next month.     4. Depression: Continue Cymbalta. PCP increase dose to 90 mg daily. 5.  Mid/low back pain/shoulder girdle pain: right neck and shoulder girdle pain appears largely myofascial. Really hasn't changed -referral again to outpt PT to address shoulder rom, modalities and to establish HEP when available        -continue tizanidine  to replace flexeril for muscle spasm, start at 1mg  q6 prn 6. Lumbar Radiculopathy:  maintain HEP, posture 7OA of right hand:  Voltaren gel and heat therapy. -CONTINUEoral Voltaren 50 mg twice daily with food. . 8. Migraines:stable, largely driven by shoulder girdle and neck pain 9. Left CTS:S/P EMG on 03/08/2017: S/P Carpal Tunnel Release on 06/14/2017 via . Dr. Apolonio Schneiders.  10. OIC, ?IBS: trial of movantik   -probiotic  -increased hydration, fruits, and fiber  -increased physical activity   15 minutes of tele-visit time was spent with this patient today. Follow up in 2 months

## 2019-01-13 ENCOUNTER — Other Ambulatory Visit: Payer: Self-pay

## 2019-01-13 DIAGNOSIS — M7918 Myalgia, other site: Secondary | ICD-10-CM

## 2019-01-13 DIAGNOSIS — E1142 Type 2 diabetes mellitus with diabetic polyneuropathy: Secondary | ICD-10-CM

## 2019-01-13 DIAGNOSIS — M797 Fibromyalgia: Secondary | ICD-10-CM

## 2019-01-13 MED ORDER — MORPHINE SULFATE 15 MG PO TABS: 15.0000 mg | ORAL_TABLET | Freq: Two times a day (BID) | ORAL | 0 refills | Status: DC | PRN

## 2019-01-13 NOTE — Telephone Encounter (Signed)
Patient called stated that she has only 7 pills of morphine left.  Was told by pharmacy they would send [place] notification of a needed refill of this medication 2 days ago.  No noticed recieved as of yet in this clinic.  According to PMP:  Fill Date       Written                      Drug                                            Qty                 Prescriber 12/12/2018  12/06/2018          Morphine Sulfate Ir 15 Mg Tab      60.00                 Za Swa

## 2019-01-13 NOTE — Telephone Encounter (Signed)
Placed a call with Ms. Cowens, her Morphine was e-scribed, she verbalizes understanding. PMP was reviewed.

## 2019-01-24 ENCOUNTER — Other Ambulatory Visit: Payer: Self-pay | Admitting: Family Medicine

## 2019-01-30 ENCOUNTER — Other Ambulatory Visit: Payer: Self-pay | Admitting: Family Medicine

## 2019-02-07 ENCOUNTER — Encounter: Payer: Medicare HMO | Attending: Physical Medicine and Rehabilitation | Admitting: Physical Medicine & Rehabilitation

## 2019-02-07 ENCOUNTER — Encounter: Payer: Self-pay | Admitting: Physical Medicine & Rehabilitation

## 2019-02-07 ENCOUNTER — Other Ambulatory Visit: Payer: Self-pay

## 2019-02-07 VITALS — BP 140/55 | HR 93 | Temp 97.5°F | Ht 63.0 in | Wt 148.2 lb

## 2019-02-07 DIAGNOSIS — M542 Cervicalgia: Secondary | ICD-10-CM

## 2019-02-07 DIAGNOSIS — E1142 Type 2 diabetes mellitus with diabetic polyneuropathy: Secondary | ICD-10-CM

## 2019-02-07 DIAGNOSIS — M609 Myositis, unspecified: Secondary | ICD-10-CM | POA: Diagnosis not present

## 2019-02-07 DIAGNOSIS — M961 Postlaminectomy syndrome, not elsewhere classified: Secondary | ICD-10-CM | POA: Insufficient documentation

## 2019-02-07 DIAGNOSIS — M797 Fibromyalgia: Secondary | ICD-10-CM

## 2019-02-07 DIAGNOSIS — M7918 Myalgia, other site: Secondary | ICD-10-CM

## 2019-02-07 MED ORDER — MORPHINE SULFATE 15 MG PO TABS: 15.0000 mg | ORAL_TABLET | Freq: Two times a day (BID) | ORAL | 0 refills | Status: DC | PRN

## 2019-02-07 NOTE — Patient Instructions (Signed)
PLEASE FEEL FREE TO CALL OUR OFFICE WITH ANY PROBLEMS OR QUESTIONS ([phone])     WORK ON HEAT, RANGE OF MOTION, GOOD POSTURE!!

## 2019-02-07 NOTE — Progress Notes (Signed)
Subjective:    Patient ID: Robin Morgan, female    DOB: April 14, 1956, 63 y.o.   MRN: [id number]  HPI   Robin Morgan is here in follow up of her chronic pain. She has had ongoing headache.  Pain is most severe in her shoulders and lower neck but radiates into the head.  Pain usually starts in the shoulder blades when she knows that the problems are coming.  She does use some heat at times as well as her muscle relaxants with some relief.  She uses her morphine immediate release also for pain control.  She has reported some pain radiating from the neck into the shoulders and arms as well.  She denies sensory symptoms at present.  She had previous results with the cervical epidural injections.  I reviewed her last MRI which was from 2017 and this demonstrated stenosis in the mid levels with potential nerve root compression.  Pain Inventory Average Pain 9 Pain Right Now 8 My pain is constant, stabbing and aching  In the last 24 hours, has pain interfered with the following? General activity 8 Relation with others 7 Enjoyment of life 0 What TIME of day is your pain at its worst? daytime evening night Sleep (in general) Fair  Pain is worse with: sitting and standing Pain improves with: medication Relief from Meds: 7  Mobility ability to climb steps?  yes do you drive?  yes  Function disabled: date disabled na  Neuro/Psych bladder control problems spasms loss of taste or smell  Prior Studies Any changes since last visit?  no  Physicians involved in your care Any changes since last visit?  no   Family History  Problem Relation Age of Onset  . Alzheimer's disease Mother   . Diabetes Father   . Heart disease Maternal Grandmother   . Breast cancer Paternal Grandmother 35   Social History   Socioeconomic History  . Marital status: Divorced    Spouse name: Not on file  . Number of children: 1  . Years of education: Not on file  . Highest education level: Not on file   Occupational History  . Occupation: disabled  Social Needs  . Financial resource strain: Not on file  . Food insecurity    Worry: Not on file    Inability: Not on file  . Transportation needs    Medical: Not on file    Non-medical: Not on file  Tobacco Use  . Smoking status: Never Smoker  . Smokeless tobacco: Never Used  Substance and Sexual Activity  . Alcohol use: No  . Drug use: No  . Sexual activity: Not Currently  Lifestyle  . Physical activity    Days per week: Not on file    Minutes per session: Not on file  . Stress: Not on file  Relationships  . Social Herbalist on phone: Not on file    Gets together: Not on file    Attends religious service: Not on file    Active member of club or organization: Not on file    Attends meetings of clubs or organizations: Not on file    Relationship status: Not on file  Other Topics Concern  . Not on file  Social History Narrative  . Not on file   Past Surgical History:  Procedure Laterality Date  . ABDOMINAL HYSTERECTOMY     1986  . APPENDECTOMY     1983  . BREAST EXCISIONAL BIOPSY Right 1990's  .  BREAST SURGERY     rt breast cyst done in the 90's  . CHOLECYSTECTOMY    . GALLBLADDER SURGERY     1983  . ROTATOR CUFF REPAIR     right  . SPINE SURGERY    . TONSILLECTOMY     1973   Past Medical History:  Diagnosis Date  . Calcific tendonitis   . Cervical spondylosis without myelopathy   . Depression   . Diabetes mellitus   . Esophageal stricture   . Fibromyalgia   . GERD (gastroesophageal reflux disease)   . Hiatal hernia   . Hyperlipidemia   . Hypertension    BP (!) 140/55   Pulse 93   Temp (!) 97.5 F (36.4 C)   Ht 5\' 3"  (1.6 m)   Wt 148 lb 3.2 oz (67.2 kg)   SpO2 95%   BMI 26.25 kg/m   Opioid Risk Score:   Fall Risk Score:  `1  Depression screen PHQ 2/9  Depression screen [facility] 2/9 06/16/2018 03/07/2018 02/08/2018 12/07/2017 11/09/2017 10/27/2017 07/29/2017  Decreased Interest 0 1 0 0 0 3 3   Down, Depressed, Hopeless 0 1 0 0 0 3 3  PHQ - 2 Score 0 2 0 0 0 6 6  Altered sleeping 0 - 0 - 0 - -  Tired, decreased energy 0 - 0 - 0 - -  Change in appetite 0 - 0 - 0 - -  Feeling bad or failure about yourself  0 - 0 - 0 - -  Trouble concentrating 0 - 0 - 0 - -  Moving slowly or fidgety/restless 0 - 0 - 0 - -  Suicidal thoughts 0 - 0 - 0 - -  PHQ-9 Score 0 - 0 - 0 - -  Difficult doing work/chores Not difficult at all - Not difficult at all - - - -    Review of Systems  Constitutional: Positive for diaphoresis and unexpected weight change.  HENT: Negative.   Eyes: Negative.   Respiratory: Positive for apnea, cough and shortness of breath.   Cardiovascular: Negative.   Gastrointestinal: Positive for abdominal pain.  Endocrine: Negative.   Genitourinary: Negative.   Musculoskeletal: Negative.   Skin: Negative.   Allergic/Immunologic: Negative.   Neurological: Negative.   Hematological: Negative.   Psychiatric/Behavioral: Negative.   All other systems reviewed and are negative.      Objective:   Physical Exam  General: Alert and oriented x 3, No apparent distress HEENT: Head is normocephalic, atraumatic, PERRLA, EOMI, sclera anicteric, oral mucosa pink and moist, dentition intact, ext ear canals clear,  Neck: Supple without JVD or lymphadenopathy Heart: Reg rate and rhythm. No murmurs rubs or gallops Chest: CTA bilaterally without wheezes, rales, or rhonchi; no distress Abdomen: Soft, non-tender, non-distended, bowel sounds positive. Extremities: No clubbing, cyanosis, or edema. Pulses are 2+ Skin: Clean and intact without signs of breakdown Neuro: Pt is cognitively appropriate with normal insight, memory, and awareness. Cranial nerves 2-12 are intact. Sensory exam is normal. Reflexes are 1 to 2+ in all 4's. Fine motor coordination is intact. No tremors. Motor function is grossly 5/5 but does have some pain with spurling's maneuver and flexion of head into right and left  shoulders/arms. .  Musculoskeletal: HEAD FORWARD POSTURE, SHOULDERS ROUNDED. Pain with rom of traps and cervical PROM. Pain shoulder ER/IR.  Psych: Pt's affect is appropriate. Pt is cooperative         Assessment & Plan:  1. Fibromyalgia: Continue exercise and heat therapy.Maintain  physical activity to tolerance.  2. Rotator cuff syndrome on the right>left:continue rotator cuff exercises, and scapular ROM exercises as she's been doing.  3. Cervicalgia. Post-laminectomy syndrome, facet arthropathy: Cervical Radiculopathy: Continue Gabapentin.   -MS IR 15mg  q12 prn #60. RF today -We will continue the controlled substance monitoring program, this consists of regular clinic visits, examinations, routine drug screening, pill counts as well as use of [place] Controlled Substance Reporting System. [place] today.     -Medication was refilled and a second prescription was sent to the patient's pharmacy for next month.   -needs a new MRI to assess foramen, canal given her increased pain and headaches     -in meantime she needs to work on heat, ROM, posture 4. Depression: Continue Cymbalta. PCP increase dose to 90 mg daily. 5. Mid/low back pain/shoulder girdle pain: right neck and shoulder girdle pain appears largely myofascial. Really hasn't changed -continue HEP as possible.  -continue tizanidine to replace flexeril for muscle spasm, start at 1mg  q6 prn 6. Lumbar Radiculopathy:  maintain HEP, posture 7OA of right hand:  Voltaren gel and heat therapy. -CONTINUEoral Voltaren 50 mg twice daily with food. . 8. Migraines:stable, largely driven by shoulder girdle and neck pain 9. Left CTS:S/P EMG on 03/08/2017: S/P Carpal Tunnel Release on 06/14/2017 via . Dr. Apolonio Schneiders.  10. OIC: movantik           -probiotic        -increased hydration, fruits, and fiber        -increased physical activity  Fifteen  minutes of face to face patient care time were spent during this visit. All questions were encouraged and answered.  Follow up with me or NP in a month. Marland Kitchen

## 2019-02-08 ENCOUNTER — Other Ambulatory Visit: Payer: Self-pay | Admitting: Physical Medicine & Rehabilitation

## 2019-02-15 ENCOUNTER — Other Ambulatory Visit: Payer: Self-pay

## 2019-02-15 DIAGNOSIS — G4733 Obstructive sleep apnea (adult) (pediatric): Secondary | ICD-10-CM | POA: Diagnosis not present

## 2019-02-15 DIAGNOSIS — M7918 Myalgia, other site: Secondary | ICD-10-CM

## 2019-02-15 DIAGNOSIS — M797 Fibromyalgia: Secondary | ICD-10-CM

## 2019-02-15 MED ORDER — TIZANIDINE HCL 2 MG PO TABS: 1.0000 mg | ORAL_TABLET | Freq: Four times a day (QID) | ORAL | 2 refills | Status: DC | PRN

## 2019-02-28 ENCOUNTER — Other Ambulatory Visit: Payer: Self-pay | Admitting: Family Medicine

## 2019-03-01 NOTE — Telephone Encounter (Signed)
Called pt to schedule appt. No answer, unable to LVM.

## 2019-03-01 NOTE — Telephone Encounter (Signed)
Due for OV for diabetes by September. Please call to schedule. Refilled lantus. Thanks.

## 2019-03-03 ENCOUNTER — Other Ambulatory Visit: Payer: Self-pay

## 2019-03-03 ENCOUNTER — Ambulatory Visit
Admission: RE | Admit: 2019-03-03 | Discharge: 2019-03-03 | Disposition: A | Discharge status: Home / Self Care | Payer: Medicare HMO | Source: Ambulatory Visit | Attending: Physical Medicine & Rehabilitation | Admitting: Physical Medicine & Rehabilitation

## 2019-03-03 DIAGNOSIS — M4802 Spinal stenosis, cervical region: Secondary | ICD-10-CM | POA: Diagnosis not present

## 2019-03-03 DIAGNOSIS — M542 Cervicalgia: Secondary | ICD-10-CM

## 2019-03-09 ENCOUNTER — Encounter: Payer: Medicare HMO | Attending: Physical Medicine and Rehabilitation | Admitting: Registered Nurse

## 2019-03-09 ENCOUNTER — Encounter: Payer: Self-pay | Admitting: Registered Nurse

## 2019-03-09 ENCOUNTER — Other Ambulatory Visit: Payer: Self-pay

## 2019-03-09 ENCOUNTER — Other Ambulatory Visit: Payer: Self-pay | Admitting: Family Medicine

## 2019-03-09 VITALS — BP 109/68 | HR 100 | Temp 98.8°F | Resp 12 | Ht 61.5 in | Wt 148.0 lb

## 2019-03-09 DIAGNOSIS — M75101 Unspecified rotator cuff tear or rupture of right shoulder, not specified as traumatic: Secondary | ICD-10-CM | POA: Diagnosis not present

## 2019-03-09 DIAGNOSIS — Z5181 Encounter for therapeutic drug level monitoring: Secondary | ICD-10-CM | POA: Diagnosis not present

## 2019-03-09 DIAGNOSIS — M609 Myositis, unspecified: Secondary | ICD-10-CM | POA: Diagnosis not present

## 2019-03-09 DIAGNOSIS — M546 Pain in thoracic spine: Secondary | ICD-10-CM | POA: Diagnosis not present

## 2019-03-09 DIAGNOSIS — M961 Postlaminectomy syndrome, not elsewhere classified: Secondary | ICD-10-CM

## 2019-03-09 DIAGNOSIS — G894 Chronic pain syndrome: Secondary | ICD-10-CM

## 2019-03-09 DIAGNOSIS — M4716 Other spondylosis with myelopathy, lumbar region: Secondary | ICD-10-CM | POA: Diagnosis not present

## 2019-03-09 DIAGNOSIS — Z79899 Other long term (current) drug therapy: Secondary | ICD-10-CM

## 2019-03-09 DIAGNOSIS — M75102 Unspecified rotator cuff tear or rupture of left shoulder, not specified as traumatic: Secondary | ICD-10-CM

## 2019-03-09 DIAGNOSIS — M5010 Cervical disc disorder with radiculopathy, unspecified cervical region: Secondary | ICD-10-CM

## 2019-03-09 DIAGNOSIS — M542 Cervicalgia: Secondary | ICD-10-CM | POA: Diagnosis not present

## 2019-03-09 DIAGNOSIS — M7918 Myalgia, other site: Secondary | ICD-10-CM | POA: Diagnosis not present

## 2019-03-09 DIAGNOSIS — E1142 Type 2 diabetes mellitus with diabetic polyneuropathy: Secondary | ICD-10-CM | POA: Diagnosis not present

## 2019-03-09 DIAGNOSIS — M797 Fibromyalgia: Secondary | ICD-10-CM

## 2019-03-09 DIAGNOSIS — G8929 Other chronic pain: Secondary | ICD-10-CM

## 2019-03-09 MED ORDER — MORPHINE SULFATE 15 MG PO TABS: 15.0000 mg | ORAL_TABLET | Freq: Two times a day (BID) | ORAL | 0 refills | Status: DC | PRN

## 2019-03-09 NOTE — Progress Notes (Signed)
Subjective:    Patient ID: Robin Morgan, female    DOB: September 28, 1955, 63 y.o.   MRN: [id number]  HPI: Robin Morgan is a 63 y.o. female who returns for follow up appointment for chronic pain and medication refill. She states her pain is located in her neck radiating into her bilateral shoulders and mid- lower back pain. She rates her pain 8. Her current exercise regime is walking.  Robin Morgan Morphine equivalent is 30.00 MME.  Last UDS was Performed on 09/15/2018, it was consistent.   Pain Inventory Average Pain 9 Pain Right Now 8 My pain is constant, sharp and aching  In the last 24 hours, has pain interfered with the following? General activity 8 Relation with others 8 Enjoyment of life 9 What TIME of day is your pain at its worst? morning,daytime,evening Sleep (in general) Fair  Pain is worse with: sitting and standing Pain improves with: medication Relief from Meds: 7  Mobility ability to climb steps?  yes do you drive?  yes  Function disabled: date disabled n/a  Neuro/Psych bladder control problems bowel control problems spasms  Prior Studies no  Physicians involved in your care no   Family History  Problem Relation Age of Onset  . Alzheimer's disease Mother   . Diabetes Father   . Heart disease Maternal Grandmother   . Breast cancer Paternal Grandmother 31   Social History   Socioeconomic History  . Marital status: Divorced    Spouse name: Not on file  . Number of children: 1  . Years of education: Not on file  . Highest education level: Not on file  Occupational History  . Occupation: disabled  Social Needs  . Financial resource strain: Not on file  . Food insecurity    Worry: Not on file    Inability: Not on file  . Transportation needs    Medical: Not on file    Non-medical: Not on file  Tobacco Use  . Smoking status: Never Smoker  . Smokeless tobacco: Never Used  Substance and Sexual Activity  . Alcohol use: No  . Drug use: No   . Sexual activity: Not Currently  Lifestyle  . Physical activity    Days per week: Not on file    Minutes per session: Not on file  . Stress: Not on file  Relationships  . Social Herbalist on phone: Not on file    Gets together: Not on file    Attends religious service: Not on file    Active member of club or organization: Not on file    Attends meetings of clubs or organizations: Not on file    Relationship status: Not on file  Other Topics Concern  . Not on file  Social History Narrative  . Not on file   Past Surgical History:  Procedure Laterality Date  . ABDOMINAL HYSTERECTOMY     1986  . APPENDECTOMY     1983  . BREAST EXCISIONAL BIOPSY Right 1990's  . BREAST SURGERY     rt breast cyst done in the 90's  . CHOLECYSTECTOMY    . GALLBLADDER SURGERY     1983  . ROTATOR CUFF REPAIR     right  . SPINE SURGERY    . TONSILLECTOMY     1973   Past Medical History:  Diagnosis Date  . Calcific tendonitis   . Cervical spondylosis without myelopathy   . Depression   . Diabetes mellitus   .  Esophageal stricture   . Fibromyalgia   . GERD (gastroesophageal reflux disease)   . Hiatal hernia   . Hyperlipidemia   . Hypertension    There were no vitals taken for this visit.  Opioid Risk Score:   Fall Risk Score:  `1  Depression screen PHQ 2/9  Depression screen [facility] 2/9 06/16/2018 03/07/2018 02/08/2018 12/07/2017 11/09/2017 10/27/2017 07/29/2017  Decreased Interest 0 1 0 0 0 3 3  Down, Depressed, Hopeless 0 1 0 0 0 3 3  PHQ - 2 Score 0 2 0 0 0 6 6  Altered sleeping 0 - 0 - 0 - -  Tired, decreased energy 0 - 0 - 0 - -  Change in appetite 0 - 0 - 0 - -  Feeling bad or failure about yourself  0 - 0 - 0 - -  Trouble concentrating 0 - 0 - 0 - -  Moving slowly or fidgety/restless 0 - 0 - 0 - -  Suicidal thoughts 0 - 0 - 0 - -  PHQ-9 Score 0 - 0 - 0 - -  Difficult doing work/chores Not difficult at all - Not difficult at all - - - -      Review of Systems   Constitutional: Positive for unexpected weight change.       Night sweat High blood pressure Low blood sugar  Respiratory: Positive for apnea.   All other systems reviewed and are negative.      Objective:   Physical Exam Vitals signs and nursing note reviewed.  Constitutional:      Appearance: Normal appearance.  Neck:     Musculoskeletal: Normal range of motion and neck supple.  Cardiovascular:     Rate and Rhythm: Normal rate and regular rhythm.     Pulses: Normal pulses.     Heart sounds: Normal heart sounds.  Pulmonary:     Effort: Pulmonary effort is normal.     Breath sounds: Normal breath sounds.  Musculoskeletal:     Comments: Normal Muscle Bulk and Muscle Testing Reveals:  Upper Extremities: Full ROM and Muscle Strength 5/5 Thoracic Paraspinal Tenderness: T-7-T-9 Lower Extremities: Full ROM and Muscle Strength 5/5 Arises from Table wit ease Narrow Based Gait   Skin:    General: Skin is warm and dry.  Neurological:     Mental Status: She is alert and oriented to person, place, and time.  Psychiatric:        Mood and Affect: Mood normal.        Behavior: Behavior normal.           Assessment & Plan:  1. Fibromyalgia with myofascial pain: Continuehomeexercise programand heat therapy. Continuecurrent medication regimen withFlexeril.03/09/2019 2. Rotator cuff syndrome on the right: Continue with stretching exercises and heat therapy.03/09/2019 3. Cervicalgia. Post-laminectomy syndrome, facet arthropathy:Cervical Radiculopathy: Continue current medication regimen withGabapentin.03/09/2019 S/ P EMG with Dr. Naaman Morgan on 03/08/2017: Diagnosed with Moderate to Severe CTS of Left wrist:03/09/2019 4. Depression: Continuecurrent medication regimen withCymbalta. PCPFollowing. Currentdose 90 mg daily.03/09/2019 5. Mid/low back pain/ Lumbar Spondylosis: Continue Current Medication and stretching and heat therapy.03/09/2019 Refilled: Morphine Sulfate  IR15 mg one tablet twice a day as needed for pain #60. 6. Lumbar Radiculopathy:Continuecurrent medication regimen withGabapentin. 03/09/2019 7.OA of right hand: Continue using Voltaren gel/ May substitute with Voltaren Tablet, she realizes she can't use both and verbalizes understanding. Continue withheat therapy. 03/09/2019 8. Migraines: No complaints today. Continue with headache Journal.  Continue to Monitor 03/09/2019 9. Left CTS:S/P EMG on 03/08/2017: S/P  Carpal Tunnel Release on 06/14/2017 via . Dr. Ortman.03/09/2019 10.. Muscle Spasm: Continue Flexeril. Continue to Monitor.03/09/2019  20 minutes of face to face patient care time was spent during this visit. All questions were encouraged and answered.   F/U in 21month

## 2019-03-13 NOTE — Telephone Encounter (Signed)
I spoke with patient re: cervical MRI. Please help me arrange a C7-T1 translaminar ESI at [facility] on [address]. There are orders in Epic of epidural but they are not offered as a provider.  Thanks

## 2019-03-14 ENCOUNTER — Telehealth: Payer: Self-pay

## 2019-03-14 ENCOUNTER — Telehealth: Payer: Self-pay | Admitting: *Deleted

## 2019-03-14 DIAGNOSIS — M5010 Cervical disc disorder with radiculopathy, unspecified cervical region: Secondary | ICD-10-CM

## 2019-03-14 DIAGNOSIS — M961 Postlaminectomy syndrome, not elsewhere classified: Secondary | ICD-10-CM

## 2019-03-14 DIAGNOSIS — M4802 Spinal stenosis, cervical region: Secondary | ICD-10-CM

## 2019-03-14 NOTE — Telephone Encounter (Signed)
Per Angelita Ingles at [facility] they do not do nerve root block (translaminar) in the cervical area--

## 2019-03-14 NOTE — Telephone Encounter (Signed)
I am repeating what the scheduling coordinator stated to me.  I read her what you asked for and she states they do not do the "nerve root block-"- she said that's what he means by translaminar............,

## 2019-03-14 NOTE — Telephone Encounter (Signed)
Order placed for Dr. Maryjean Ka in [place]. Not sure if it's ordered correctly, but it's in

## 2019-03-14 NOTE — Telephone Encounter (Signed)
Per Dr Naaman Plummer "I spoke with patient re: cervical MRI. Please help me arrange a C7-T1 translaminar ESI at [facility] on [address]. There are orders in Epic of epidural but they are not offered as a provider.  Thanks" from previous [employer].  Dr Letta Pate said Laurence Spates or Dr Maryjean Ka does these.

## 2019-03-14 NOTE — Telephone Encounter (Signed)
I didn't ask for nerve root block. Asked for translaminar ESI C7-T1 which I have had done there before.  If I need to send to [facility] or [place] that's fine

## 2019-03-14 NOTE — Telephone Encounter (Signed)
I see where she had one before in imaging on 02/12/16.  If that is what you want it has the order number.

## 2019-03-17 NOTE — Telephone Encounter (Signed)
Order was placed by Dr Naaman Plummer Ambulatory epidural steroid injection (Order [id number]) but per April it has to be placed as a referral to [facility].  I have place this referral.

## 2019-03-24 ENCOUNTER — Other Ambulatory Visit: Payer: Self-pay

## 2019-03-24 DIAGNOSIS — G5602 Carpal tunnel syndrome, left upper limb: Secondary | ICD-10-CM

## 2019-03-24 DIAGNOSIS — M797 Fibromyalgia: Secondary | ICD-10-CM

## 2019-03-24 MED ORDER — GABAPENTIN 300 MG PO CAPS: ORAL_CAPSULE | ORAL | 3 refills | Status: DC

## 2019-03-24 NOTE — Telephone Encounter (Signed)
Message from patient along with medication refill request.  Refill already complete via clinical protocol.

## 2019-04-04 ENCOUNTER — Encounter: Payer: Self-pay | Admitting: Family Medicine

## 2019-04-06 ENCOUNTER — Encounter: Payer: Self-pay | Admitting: Family Medicine

## 2019-04-06 ENCOUNTER — Ambulatory Visit (INDEPENDENT_AMBULATORY_CARE_PROVIDER_SITE_OTHER): Payer: Medicare HMO | Admitting: Family Medicine

## 2019-04-06 ENCOUNTER — Other Ambulatory Visit: Payer: Self-pay

## 2019-04-06 VITALS — BP 110/70 | HR 93 | Temp 98.4°F | Ht 61.5 in | Wt 149.6 lb

## 2019-04-06 DIAGNOSIS — G894 Chronic pain syndrome: Secondary | ICD-10-CM

## 2019-04-06 DIAGNOSIS — E1142 Type 2 diabetes mellitus with diabetic polyneuropathy: Secondary | ICD-10-CM

## 2019-04-06 DIAGNOSIS — Z23 Encounter for immunization: Secondary | ICD-10-CM

## 2019-04-06 DIAGNOSIS — E782 Mixed hyperlipidemia: Secondary | ICD-10-CM | POA: Diagnosis not present

## 2019-04-06 DIAGNOSIS — I10 Essential (primary) hypertension: Secondary | ICD-10-CM | POA: Diagnosis not present

## 2019-04-06 DIAGNOSIS — N62 Hypertrophy of breast: Secondary | ICD-10-CM

## 2019-04-06 DIAGNOSIS — Z794 Long term (current) use of insulin: Secondary | ICD-10-CM

## 2019-04-06 DIAGNOSIS — E1140 Type 2 diabetes mellitus with diabetic neuropathy, unspecified: Secondary | ICD-10-CM

## 2019-04-06 LAB — POCT GLYCOSYLATED HEMOGLOBIN (HGB A1C): Hemoglobin A1C: 7.3 % — AB (ref 4.0–5.6)

## 2019-04-06 NOTE — Patient Instructions (Addendum)
Please return in November for your annual complete physical; please come fasting.  Please get your mammogram done.  We will call you with information regarding your referral appointment. Dr. Harlow Mares to consider breast reduction surgery to see if this will help with your chronic back pain.  If you do not hear from [place] within the next 2 weeks, please let me know. It can take 1-2 weeks to get appointments set up with the specialists.   Work on your diabetes a little. Check your fasting sugars and increase your lantus a few units if needed to keep them < 120 consistently.   If you have any questions or concerns, please don't hesitate to send me a message via MyChart or call the office at [phone]. Thank you for visiting with [place] today! It's our pleasure caring for you.

## 2019-04-06 NOTE — Progress Notes (Signed)
Subjective  CC:  Chief Complaint  Patient presents with  . Diabetes    follow up  . Would like to discuss symptoms of mold    HPI: Robin Morgan is a 63 y.o. female who presents to the office today for follow up of diabetes and problems listed above in the chief complaint.   Diabetes follow up: Her diabetic control is reported as Unchanged. No sxs of hyperglycemia. On lantus and metformin. Never have tried sglt-2 or GLP-1 (maybe cost prohibitive).  She denies exertional CP or SOB or symptomatic hypoglycemia. She denies foot sores or paresthesias. Gabapentin controls periph neuropathy and helps with chronic pain  Chronic pain: per pain specialist, agrees should consider breast reduction surgery due to persistent hard to control chronic back pain with muscle spasm.   BP and lipids are stable on meds w/o AEs. No cp  GI: still with constipation/abdominal cramping. Has f/u with GI scheduled. No new sxs.   Mood is stable but struggles due to pain issues.   Wt Readings from Last 3 Encounters:  04/06/19 149 lb 9.6 oz (67.9 kg)  03/09/19 148 lb (67.1 kg)  02/07/19 148 lb 3.2 oz (67.2 kg)    BP Readings from Last 3 Encounters:  04/06/19 110/70  03/09/19 109/68  02/07/19 (!) 140/55    Assessment  1. Controlled type 2 diabetes mellitus with diabetic neuropathy, with long-term current use of insulin ([place])   2. Mixed hyperlipidemia   3. Essential hypertension   4. Diabetic peripheral neuropathy associated with type 2 diabetes mellitus ([place])   5. Chronic pain associated with significant psychosocial dysfunction   6. Macromastia   7. Need for immunization against influenza      Plan   Diabetes is currently adequately controlled. Will work on diet and mild increase in lantus; recheck 3 months; add 3rd agent if worsening. Flu shot today  Macromastia associated back pain: refer for plastic surgery consultation. Needs mammogram as well.   bp and lipids are controlled.   Chronic  pain per Dr. Sunny Schlein al   Follow up: Return in about 3 months (around 07/07/2019) for complete physical, follow up of diabetes and hypertension.. Orders Placed This Encounter  Procedures  . Flu Vaccine QUAD High Dose(Fluad)  . Ambulatory referral to Plastic Surgery  . POCT glycosylated hemoglobin (Hb A1C)   No orders of the defined types were placed in this encounter.     Immunization History  Administered Date(s) Administered  . Fluad Quad(high Dose 65+) 04/06/2019  . Influenza Split 06/04/2009, 05/02/2012  . Influenza, Quadrivalent, Recombinant, Inj, Pf 05/12/2016, 07/07/2017  . Influenza, Seasonal, Injecte, Preservative Fre 06/06/2014, 04/24/2015  . Influenza,inj,Quad PF,6+ Mos 05/05/2018  . Influenza-Unspecified 09/03/2011, 04/19/2013  . Pneumococcal Polysaccharide-23 08/29/2009, 10/19/2018  . Tdap 05/11/2012    Diabetes Related Lab Review: Lab Results  Component Value Date   HGBA1C 7.3 (A) 04/06/2019   HGBA1C 6.9 (A) 10/14/2018   HGBA1C 7.0 (A) 06/16/2018    Lab Results  Component Value Date   MICROALBUR 1.8 06/16/2018   Lab Results  Component Value Date   CREATININE 0.81 09/02/2018   BUN 14 09/02/2018   NA 143 09/02/2018   K 4.3 09/02/2018   CL 106 09/02/2018   CO2 28 09/02/2018   Lab Results  Component Value Date   CHOL 132 06/16/2018   Lab Results  Component Value Date   HDL 39.90 06/16/2018   Lab Results  Component Value Date   LDLCALC 68 06/16/2018  Lab Results  Component Value Date   TRIG 120.0 06/16/2018   Lab Results  Component Value Date   CHOLHDL 3 06/16/2018   No results found for: LDLDIRECT The 10-year ASCVD risk score Mikey Bussing DC Jr., et al., 2013) is: 9.6%   Values used to calculate the score:     Age: 78 years     Sex: Female     Is Non-Hispanic African American: No     Diabetic: Yes     Tobacco smoker: No     Systolic Blood Pressure: [id number] mmHg     Is BP treated: Yes     HDL Cholesterol: 39.9 mg/dL     Total  Cholesterol: 132 mg/dL I have reviewed the PMH, Fam and Soc history. Patient Active Problem List   Diagnosis Date Noted  . Diabetic peripheral neuropathy associated with type 2 diabetes mellitus ([place]) 01/06/2017    Priority: High  . OSA (obstructive sleep apnea) 03/10/2016    Priority: High  . Controlled diabetes mellitus with diabetic neuropathy, with long-term current use of insulin ([place]) 10/11/2014    Priority: High  . HTN (hypertension) 10/11/2014    Priority: High  . Hyperlipidemia 10/11/2014    Priority: High  . DDD (degenerative disc disease), lumbar 01/15/2014    Priority: High  . Hypothyroidism, acquired 06/12/2013    Priority: High  . Cervical post-laminectomy syndrome 05/10/2013    Priority: High  . Chronic pain associated with significant psychosocial dysfunction 01/03/2010    Priority: High    Overview:  Chronic Pain Syndrome, Dr. Earl Lites  Overview:  Overview:  Overview:  Chronic Pain Syndrome, Dr. Earl Lites   . Esophageal stricture     Priority: Medium  . Tenosynovitis, wrist 10/07/2017    Priority: Medium  . Trigger thumb of left hand 10/07/2017    Priority: Medium  . Laryngopharyngeal reflux (LPR) 08/26/2017    Priority: Medium  . Dysphagia 08/26/2017    Priority: Medium  . Left carpal tunnel syndrome 03/08/2017    Priority: Medium  . Fibromyalgia 02/12/2016    Priority: Medium  . Osteopenia 12/01/2012    Priority: Medium    10/2017: osteopenia: T = -1.9; stable. Recheck 2 years T = -1.2 at hip, -1.8 at spine, 4/ 2014   . Chronic diarrhea 07/04/2012    Priority: Medium    Overview:  ? Related to gallbladder surgery. Has been evaluated by Dr. Amedeo Plenty [facility]. Lomotil as needed.   . Chronic prescription opiate use 09/02/2018    Social History: Patient  reports that she has never smoked. She has never used smokeless tobacco. She reports that she does not drink alcohol or use drugs.  Review of Systems: Ophthalmic: negative for eye pain, loss of  vision or double vision Cardiovascular: negative for chest pain Respiratory: negative for SOB or persistent cough Gastrointestinal: negative for abdominal pain Genitourinary: negative for dysuria or gross hematuria MSK: negative for foot lesions Neurologic: negative for weakness or gait disturbance  Objective  Vitals: BP 110/70 (BP Location: Left Arm, Patient Position: Sitting, Cuff Size: Normal)   Pulse 93   Temp 98.4 F (36.9 C) (Temporal)   Ht 5' 1.5" (1.562 m)   Wt 149 lb 9.6 oz (67.9 kg)   SpO2 98%   BMI 27.81 kg/m  General: well appearing, no acute distress  Psych:  Alert and oriented, normal mood and affect HEENT:  Normocephalic, atraumatic, moist mucous membranes, supple neck  Cardiovascular:  Nl S1 and S2, RRR without murmur, gallop or rub. no  edema Respiratory:  Good breath sounds bilaterally, CTAB with normal effort, no rales Gastrointestinal: normal BS, soft, nontender Skin:  Warm, no rashes Neurologic:   Mental status is normal. normal gait Foot exam: no erythema, pallor, or cyanosis visible nl proprioception and sensation to monofilament testing bilaterally, +2 distal pulses bilaterally    Diabetic education: ongoing education regarding chronic disease management for diabetes was given today. We continue to reinforce the ABC's of diabetic management: A1c (<7 or 8 dependent upon patient), tight blood pressure control, and cholesterol management with goal LDL < 100 minimally. We discuss diet strategies, exercise recommendations, medication options and possible side effects. At each visit, we review recommended immunizations and preventive care recommendations for diabetics and stress that good diabetic control can prevent other problems. See below for this patient's data.    Commons side effects, risks, benefits, and alternatives for medications and treatment plan prescribed today were discussed, and the patient expressed understanding of the given instructions. Patient  is instructed to call or message via MyChart if he/she has any questions or concerns regarding our treatment plan. No barriers to understanding were identified. We discussed Red Flag symptoms and signs in detail. Patient expressed understanding regarding what to do in case of urgent or emergency type symptoms.   Medication list was reconciled, printed and provided to the patient in AVS. Patient instructions and summary information was reviewed with the patient as documented in the AVS. This note was prepared with assistance of Dragon voice recognition software. Occasional wrong-word or sound-a-like substitutions may have occurred due to the inherent limitations of voice recognition software

## 2019-04-10 ENCOUNTER — Encounter: Payer: Self-pay | Admitting: Family Medicine

## 2019-04-12 ENCOUNTER — Other Ambulatory Visit: Payer: Self-pay | Admitting: Family Medicine

## 2019-04-12 DIAGNOSIS — Z1231 Encounter for screening mammogram for malignant neoplasm of breast: Secondary | ICD-10-CM

## 2019-04-13 ENCOUNTER — Encounter: Payer: Medicare HMO | Attending: Physical Medicine and Rehabilitation | Admitting: Registered Nurse

## 2019-04-13 ENCOUNTER — Other Ambulatory Visit: Payer: Self-pay

## 2019-04-13 ENCOUNTER — Encounter: Payer: Self-pay | Admitting: Registered Nurse

## 2019-04-13 VITALS — BP 128/70 | HR 100 | Temp 97.6°F | Ht 61.5 in | Wt 145.4 lb

## 2019-04-13 DIAGNOSIS — M4802 Spinal stenosis, cervical region: Secondary | ICD-10-CM

## 2019-04-13 DIAGNOSIS — M5010 Cervical disc disorder with radiculopathy, unspecified cervical region: Secondary | ICD-10-CM

## 2019-04-13 DIAGNOSIS — Z5181 Encounter for therapeutic drug level monitoring: Secondary | ICD-10-CM | POA: Diagnosis not present

## 2019-04-13 DIAGNOSIS — M797 Fibromyalgia: Secondary | ICD-10-CM | POA: Diagnosis not present

## 2019-04-13 DIAGNOSIS — M7918 Myalgia, other site: Secondary | ICD-10-CM | POA: Diagnosis not present

## 2019-04-13 DIAGNOSIS — M961 Postlaminectomy syndrome, not elsewhere classified: Secondary | ICD-10-CM | POA: Diagnosis not present

## 2019-04-13 DIAGNOSIS — M546 Pain in thoracic spine: Secondary | ICD-10-CM | POA: Diagnosis not present

## 2019-04-13 DIAGNOSIS — M4716 Other spondylosis with myelopathy, lumbar region: Secondary | ICD-10-CM | POA: Diagnosis not present

## 2019-04-13 DIAGNOSIS — Z79899 Other long term (current) drug therapy: Secondary | ICD-10-CM | POA: Diagnosis not present

## 2019-04-13 DIAGNOSIS — G894 Chronic pain syndrome: Secondary | ICD-10-CM

## 2019-04-13 DIAGNOSIS — G8929 Other chronic pain: Secondary | ICD-10-CM

## 2019-04-13 DIAGNOSIS — M609 Myositis, unspecified: Secondary | ICD-10-CM | POA: Diagnosis not present

## 2019-04-13 DIAGNOSIS — E1142 Type 2 diabetes mellitus with diabetic polyneuropathy: Secondary | ICD-10-CM

## 2019-04-13 MED ORDER — MORPHINE SULFATE 15 MG PO TABS: 15.0000 mg | ORAL_TABLET | Freq: Two times a day (BID) | ORAL | 0 refills | Status: DC | PRN

## 2019-04-13 NOTE — Progress Notes (Signed)
Subjective:    Patient ID: Robin Morgan, female    DOB: December 04, 1955, 63 y.o.   MRN: [id number]  HPI: Robin Morgan is a 63 y.o. female who returns for follow up appointment for chronic pain and medication refill. She states her  pain is located in her mid- back. She rates her pain 8. Her current exercise regime is walking and performing stretching exercises.  Ms. Widder Morphine equivalent is 30.00 MME.  Last UDS was Performed on 09/15/2018, it was consistent.   Pain Inventory Average Pain 8 Pain Right Now 8 My pain is constant, stabbing and aching  In the last 24 hours, has pain interfered with the following? General activity 9 Relation with others 9 Enjoyment of life 9 What TIME of day is your pain at its worst? evening and night Sleep (in general) Poor  Pain is worse with: standing Pain improves with: n/a Relief from Meds: n/a  Mobility ability to climb steps?  yes do you drive?  yes Do you have any goals in this area?  no  Function disabled: date disabled . Do you have any goals in this area?  no  Neuro/Psych bowel control problems spasms  Prior Studies Any changes since last visit?  no  Physicians involved in your care Any changes since last visit?  no   Family History  Problem Relation Age of Onset  . Alzheimer's disease Mother   . Diabetes Father   . Heart disease Maternal Grandmother   . Breast cancer Paternal Grandmother 52   Social History   Socioeconomic History  . Marital status: Divorced    Spouse name: Not on file  . Number of children: 1  . Years of education: Not on file  . Highest education level: Not on file  Occupational History  . Occupation: disabled  Social Needs  . Financial resource strain: Not on file  . Food insecurity    Worry: Not on file    Inability: Not on file  . Transportation needs    Medical: Not on file    Non-medical: Not on file  Tobacco Use  . Smoking status: Never Smoker  . Smokeless tobacco: Never  Used  Substance and Sexual Activity  . Alcohol use: No  . Drug use: No  . Sexual activity: Not Currently  Lifestyle  . Physical activity    Days per week: Not on file    Minutes per session: Not on file  . Stress: Not on file  Relationships  . Social Herbalist on phone: Not on file    Gets together: Not on file    Attends religious service: Not on file    Active member of club or organization: Not on file    Attends meetings of clubs or organizations: Not on file    Relationship status: Not on file  Other Topics Concern  . Not on file  Social History Narrative  . Not on file   Past Surgical History:  Procedure Laterality Date  . ABDOMINAL HYSTERECTOMY     1986  . APPENDECTOMY     1983  . BREAST EXCISIONAL BIOPSY Right 1990's  . BREAST SURGERY     rt breast cyst done in the 90's  . CHOLECYSTECTOMY    . GALLBLADDER SURGERY     1983  . ROTATOR CUFF REPAIR     right  . SPINE SURGERY    . TONSILLECTOMY     1973   Past Medical History:  Diagnosis Date  . Calcific tendonitis   . Cervical spondylosis without myelopathy   . Depression   . Diabetes mellitus   . Esophageal stricture   . Fibromyalgia   . GERD (gastroesophageal reflux disease)   . Hiatal hernia   . Hyperlipidemia   . Hypertension    BP 128/70   Pulse 100   Temp 97.6 F (36.4 C)   Ht 5' 1.5" (1.562 m)   Wt 145 lb 6.4 oz (66 kg)   SpO2 95%   BMI 27.03 kg/m   Opioid Risk Score:   Fall Risk Score:  `1  Depression screen PHQ 2/9  Depression screen [facility] 2/9 06/16/2018 03/07/2018 02/08/2018 12/07/2017 11/09/2017 10/27/2017 07/29/2017  Decreased Interest 0 1 0 0 0 3 3  Down, Depressed, Hopeless 0 1 0 0 0 3 3  PHQ - 2 Score 0 2 0 0 0 6 6  Altered sleeping 0 - 0 - 0 - -  Tired, decreased energy 0 - 0 - 0 - -  Change in appetite 0 - 0 - 0 - -  Feeling bad or failure about yourself  0 - 0 - 0 - -  Trouble concentrating 0 - 0 - 0 - -  Moving slowly or fidgety/restless 0 - 0 - 0 - -  Suicidal  thoughts 0 - 0 - 0 - -  PHQ-9 Score 0 - 0 - 0 - -  Difficult doing work/chores Not difficult at all - Not difficult at all - - - -  Some recent data might be hidden    Review of Systems  Constitutional: Negative.   HENT: Negative.   Eyes: Negative.   Respiratory: Positive for apnea.   Cardiovascular: Negative.   Gastrointestinal: Positive for constipation and diarrhea.  Endocrine: Negative.   Genitourinary: Negative.   Musculoskeletal: Positive for back pain.  Skin: Negative.   Allergic/Immunologic: Negative.   Neurological: Negative.   Hematological: Negative.   Psychiatric/Behavioral: Negative.   All other systems reviewed and are negative.      Objective:   Physical Exam Vitals signs and nursing note reviewed.  Constitutional:      Appearance: Normal appearance.  Neck:     Musculoskeletal: Normal range of motion and neck supple.  Cardiovascular:     Rate and Rhythm: Normal rate and regular rhythm.     Pulses: Normal pulses.     Heart sounds: Normal heart sounds.  Pulmonary:     Effort: Pulmonary effort is normal.     Breath sounds: Normal breath sounds.  Musculoskeletal:     Comments: Normal Muscle Bulk and Muscle Testing Reveals:  Upper Extremities: Full ROM and Muscle Strength 5/5 Thoracic Paraspinal Tenderness: T-7-T-9 Lower Extremities: Full ROM and Muscle Strength 5/5 Arises from Table with ease Narrow Based Gait   Skin:    General: Skin is warm and dry.  Neurological:     Mental Status: She is alert and oriented to person, place, and time.  Psychiatric:        Mood and Affect: Mood normal.        Behavior: Behavior normal.           Assessment & Plan:  1. Fibromyalgia with myofascial pain: Continuehomeexercise programand heat therapy. Continuecurrent medication regimen withFlexeril.04/13/2019 2. Rotator cuff syndrome on the right: Continue with stretching exercises and heat therapy.04/13/2019 3. Cervicalgia. Post-laminectomy syndrome,  facet arthropathy:Cervical Radiculopathy: Continue current medication regimen withGabapentin.04/13/2019 S/ P EMG with Dr. Naaman Plummer on 03/08/2017: Diagnosed with Moderate to Severe CTS  of Left wrist:04/13/2019 4. Depression: Continuecurrent medication regimen withCymbalta. PCPFollowing. Currentdose 90 mg daily.04/13/2019 5. Mid/low back pain/ Lumbar Spondylosis: Continue Current Medication and stretching and heat therapy.04/13/2019 Refilled: Morphine Sulfate IR15 mg one tablet twice a day as needed for pain #60. 6. Lumbar Radiculopathy:Continuecurrent medication regimen withGabapentin. 04/13/2019 7.OA of right hand: Continue Voltaren gel/ May substitute with Voltaren Tablet, she realizes she can't use both and verbalizes understanding. Continue withheat therapy. 04/13/2019 8. Migraines:No complaints today. Continue with headache Journal.Continue to Monitor 04/13/2019 9. Left CTS:S/P EMG on 03/08/2017: S/P Carpal Tunnel Release on 06/14/2017 via . Dr. Apolonio Schneiders.04/13/2019 10.. Muscle Spasm: Continue Tizanidine. Continue to Monitor.04/13/2019  15 minutes of face to face patient care time was spent during this visit. All questions were encouraged and answered.   F/U in 31month

## 2019-04-14 ENCOUNTER — Ambulatory Visit
Admission: RE | Admit: 2019-04-14 | Discharge: 2019-04-14 | Disposition: A | Discharge status: Home / Self Care | Payer: Medicare HMO | Source: Ambulatory Visit | Attending: Family Medicine | Admitting: Family Medicine

## 2019-04-14 DIAGNOSIS — Z1231 Encounter for screening mammogram for malignant neoplasm of breast: Secondary | ICD-10-CM | POA: Diagnosis not present

## 2019-04-19 ENCOUNTER — Other Ambulatory Visit: Payer: Self-pay | Admitting: Family Medicine

## 2019-04-21 ENCOUNTER — Encounter: Payer: Self-pay | Admitting: Family Medicine

## 2019-04-21 NOTE — Telephone Encounter (Signed)
Message from patient

## 2019-05-01 DIAGNOSIS — N62 Hypertrophy of breast: Secondary | ICD-10-CM | POA: Diagnosis not present

## 2019-05-09 ENCOUNTER — Ambulatory Visit: Payer: Medicare HMO | Admitting: Family Medicine

## 2019-05-15 ENCOUNTER — Encounter: Payer: Self-pay | Admitting: Registered Nurse

## 2019-05-15 ENCOUNTER — Telehealth: Payer: Self-pay | Admitting: Family Medicine

## 2019-05-15 ENCOUNTER — Encounter: Payer: Self-pay | Admitting: *Deleted

## 2019-05-15 ENCOUNTER — Other Ambulatory Visit: Payer: Self-pay

## 2019-05-15 ENCOUNTER — Encounter: Payer: Medicare HMO | Attending: Physical Medicine and Rehabilitation | Admitting: Registered Nurse

## 2019-05-15 VITALS — BP 125/75 | HR 90 | Temp 97.9°F | Ht 61.0 in | Wt 148.0 lb

## 2019-05-15 DIAGNOSIS — Z79899 Other long term (current) drug therapy: Secondary | ICD-10-CM | POA: Diagnosis not present

## 2019-05-15 DIAGNOSIS — G894 Chronic pain syndrome: Secondary | ICD-10-CM

## 2019-05-15 DIAGNOSIS — Z5181 Encounter for therapeutic drug level monitoring: Secondary | ICD-10-CM

## 2019-05-15 DIAGNOSIS — M4802 Spinal stenosis, cervical region: Secondary | ICD-10-CM

## 2019-05-15 DIAGNOSIS — M797 Fibromyalgia: Secondary | ICD-10-CM

## 2019-05-15 DIAGNOSIS — E1142 Type 2 diabetes mellitus with diabetic polyneuropathy: Secondary | ICD-10-CM

## 2019-05-15 DIAGNOSIS — M961 Postlaminectomy syndrome, not elsewhere classified: Secondary | ICD-10-CM | POA: Diagnosis not present

## 2019-05-15 DIAGNOSIS — M4716 Other spondylosis with myelopathy, lumbar region: Secondary | ICD-10-CM

## 2019-05-15 DIAGNOSIS — M7918 Myalgia, other site: Secondary | ICD-10-CM | POA: Diagnosis not present

## 2019-05-15 DIAGNOSIS — M5010 Cervical disc disorder with radiculopathy, unspecified cervical region: Secondary | ICD-10-CM

## 2019-05-15 DIAGNOSIS — M546 Pain in thoracic spine: Secondary | ICD-10-CM

## 2019-05-15 DIAGNOSIS — G8929 Other chronic pain: Secondary | ICD-10-CM

## 2019-05-15 DIAGNOSIS — M609 Myositis, unspecified: Secondary | ICD-10-CM | POA: Insufficient documentation

## 2019-05-15 MED ORDER — MORPHINE SULFATE 15 MG PO TABS: 15.0000 mg | ORAL_TABLET | Freq: Two times a day (BID) | ORAL | 0 refills | Status: DC | PRN

## 2019-05-15 NOTE — Telephone Encounter (Signed)
Copied from [place]. Topic: General - Other >> May 15, 2019  1:52 PM Celene Kras A wrote: Reason for CRM: Pt called and is requesting to have her lab work done on 06/05/2019 since her physical is having to be pushed back until January. Please advise.

## 2019-05-15 NOTE — Telephone Encounter (Signed)
Yes, that is fine. I will get her labs at her next appt in October.

## 2019-05-15 NOTE — Telephone Encounter (Signed)
Tried calling pt to make her aware we will do labs at 10/26 appointment, mailbox full MyChart message sent

## 2019-05-15 NOTE — Telephone Encounter (Signed)
Please advise 

## 2019-05-15 NOTE — Progress Notes (Signed)
Subjective:    Patient ID: Robin Morgan, female    DOB: 1955-11-05, 63 y.o.   MRN: [id number]  HPI: Robin Morgan is a 63 y.o. female who returns for follow up appointment for chronic pain and medication refill. She states her pain is located in her neck radiating into her right shoulder, mid- lower back pain and bilateral feet pain L>R. Also reports increase intensity mid- back pain, she was instructed to keep pain journal and call office at the end of week. She verbalizes understanding. She rates her pain 9. Her current exercise regime is walking and performing stretching exercises.  Robin Morgan with right red eye, she was instructed to follow up with her opthalmologist, she verbalizes understanding.   Robin Morgan Morphine equivalent is  30.00MME.  Last UDS was Performed on 09/15/2018, it was consistent.    Pain Inventory Average Pain 9 Pain Right Now 9 My pain is constant and stabbing  In the last 24 hours, has pain interfered with the following? General activity 10 Relation with others 10 Enjoyment of life 10 What TIME of day is your pain at its worst? evening Sleep (in general) Fair  Pain is worse with: standing Pain improves with: medication Relief from Meds: 5  Mobility ability to climb steps?  yes do you drive?  yes  Function disabled: date disabled .  Neuro/Psych bladder control problems spasms  Prior Studies Any changes since last visit?  no  Physicians involved in your care Any changes since last visit?  no   Family History  Problem Relation Age of Onset  . Alzheimer's disease Mother   . Diabetes Father   . Heart disease Maternal Grandmother   . Breast cancer Paternal Grandmother 62   Social History   Socioeconomic History  . Marital status: Divorced    Spouse name: Not on file  . Number of children: 1  . Years of education: Not on file  . Highest education level: Not on file  Occupational History  . Occupation: disabled  Social Needs  .  Financial resource strain: Not on file  . Food insecurity    Worry: Not on file    Inability: Not on file  . Transportation needs    Medical: Not on file    Non-medical: Not on file  Tobacco Use  . Smoking status: Never Smoker  . Smokeless tobacco: Never Used  Substance and Sexual Activity  . Alcohol use: No  . Drug use: No  . Sexual activity: Not Currently  Lifestyle  . Physical activity    Days per week: Not on file    Minutes per session: Not on file  . Stress: Not on file  Relationships  . Social Herbalist on phone: Not on file    Gets together: Not on file    Attends religious service: Not on file    Active member of club or organization: Not on file    Attends meetings of clubs or organizations: Not on file    Relationship status: Not on file  Other Topics Concern  . Not on file  Social History Narrative  . Not on file   Past Surgical History:  Procedure Laterality Date  . ABDOMINAL HYSTERECTOMY     1986  . APPENDECTOMY     1983  . BREAST EXCISIONAL BIOPSY Right 1990's  . BREAST SURGERY     rt breast cyst done in the 90's  . CHOLECYSTECTOMY    . GALLBLADDER  SURGERY     1983  . ROTATOR CUFF REPAIR     right  . SPINE SURGERY    . TONSILLECTOMY     1973   Past Medical History:  Diagnosis Date  . Calcific tendonitis   . Cervical spondylosis without myelopathy   . Depression   . Diabetes mellitus   . Esophageal stricture   . Fibromyalgia   . GERD (gastroesophageal reflux disease)   . Hiatal hernia   . Hyperlipidemia   . Hypertension    BP 125/75   Pulse 90   Temp 97.9 F (36.6 C)   Ht 5\' 1"  (1.549 m)   Wt 148 lb (67.1 kg)   SpO2 95%   BMI 27.96 kg/m   Opioid Risk Score:   Fall Risk Score:  `1  Depression screen PHQ 2/9  Depression screen [facility] 2/9 06/16/2018 03/07/2018 02/08/2018 12/07/2017 11/09/2017 10/27/2017 07/29/2017  Decreased Interest 0 1 0 0 0 3 3  Down, Depressed, Hopeless 0 1 0 0 0 3 3  PHQ - 2 Score 0 2 0 0 0 6 6  Altered  sleeping 0 - 0 - 0 - -  Tired, decreased energy 0 - 0 - 0 - -  Change in appetite 0 - 0 - 0 - -  Feeling bad or failure about yourself  0 - 0 - 0 - -  Trouble concentrating 0 - 0 - 0 - -  Moving slowly or fidgety/restless 0 - 0 - 0 - -  Suicidal thoughts 0 - 0 - 0 - -  PHQ-9 Score 0 - 0 - 0 - -  Difficult doing work/chores Not difficult at all - Not difficult at all - - - -  Some recent data might be hidden     Review of Systems  Constitutional: Positive for unexpected weight change.  HENT: Negative.   Eyes: Negative.   Respiratory: Negative.   Cardiovascular: Negative.   Gastrointestinal: Positive for abdominal pain.  Endocrine: Negative.   Genitourinary: Positive for difficulty urinating.  Musculoskeletal: Positive for arthralgias, back pain, myalgias and neck pain.  Skin: Negative.   Allergic/Immunologic: Negative.   Neurological: Negative.   Hematological: Negative.   Psychiatric/Behavioral: Negative.   All other systems reviewed and are negative.      Objective:   Physical Exam Vitals signs and nursing note reviewed.  Constitutional:      Appearance: Normal appearance.  Neck:     Musculoskeletal: Normal range of motion and neck supple.     Comments: Cervical Paraspinal Tenderness: C-5-C-6 Cardiovascular:     Rate and Rhythm: Normal rate and regular rhythm.     Pulses: Normal pulses.     Heart sounds: Normal heart sounds.  Pulmonary:     Effort: Pulmonary effort is normal.     Breath sounds: Normal breath sounds.  Musculoskeletal:     Comments: Normal Muscle Bulk and Muscle Testing Reveals:  Upper Extremities: Full ROM and Muscle Strength 5/5 Bilateral AC Joint Tenderness  Thoracic Paraspinal Tenderness: T-7-T-9 Lower Extremities: Full ROM and Muscle Strength 5/5 Arises from Table with Ease Narrow Based Gait   Skin:    General: Skin is warm and dry.  Neurological:     Mental Status: She is alert and oriented to person, place, and time.  Psychiatric:         Mood and Affect: Mood normal.        Behavior: Behavior normal.           Assessment &  Plan:  1. Fibromyalgia with myofascial pain: Continuehomeexercise programand heat therapy. Continuecurrent medication regimen withFlexeril.05/15/2019 2. Rotator cuff syndrome on the right: Continue with stretching exercises and heat therapy.05/15/2019 3. Cervicalgia. Post-laminectomy syndrome, facet arthropathy:Cervical Radiculopathy: Continue current medication regimen withGabapentin.05/15/2019 S/ P EMG with Dr. Naaman Plummer on 03/08/2017: Diagnosed with Moderate to Severe CTS of Left wrist:05/15/2019 4. Depression: Continuecurrent medication regimen withCymbalta. PCPFollowing. Currentdose 90 mg daily.05/15/2019 5. Mid/low back pain/ Lumbar Spondylosis: Continue Current Medication and stretching and heat therapy.05/15/2019 Refilled: Morphine Sulfate IR15 mg one tablet twice a day as needed for pain #60. 6. Lumbar Radiculopathy:Continuecurrent medication regimen withGabapentin. 05/15/2019 7.OA of right hand: Continue Voltaren gel/ May substitute with Voltaren Tablet, she realizes she can't use both and verbalizes understanding. Continue withheat therapy. 04/13/2019 8. Migraines:No complaints today. Continue withheadache Journal.Continue to Monitor 05/15/2019 9. Left CTS:S/P EMG on 03/08/2017: S/P Carpal Tunnel Release on 06/14/2017 via . Dr. Apolonio Schneiders.05/15/2019 10.. Muscle Spasm: Continue Tizanidine. Continue to Monitor.05/15/2019  15 minutes of face to face patient care time was spent during this visit. All questions were encouraged and answered.   F/U in 36month

## 2019-05-29 ENCOUNTER — Other Ambulatory Visit: Payer: Self-pay | Admitting: Family Medicine

## 2019-05-29 DIAGNOSIS — M542 Cervicalgia: Secondary | ICD-10-CM | POA: Diagnosis not present

## 2019-05-29 DIAGNOSIS — M5412 Radiculopathy, cervical region: Secondary | ICD-10-CM | POA: Diagnosis not present

## 2019-06-05 ENCOUNTER — Ambulatory Visit (INDEPENDENT_AMBULATORY_CARE_PROVIDER_SITE_OTHER): Payer: Medicare HMO | Admitting: Family Medicine

## 2019-06-05 ENCOUNTER — Encounter: Payer: Self-pay | Admitting: Family Medicine

## 2019-06-05 ENCOUNTER — Other Ambulatory Visit: Payer: Self-pay

## 2019-06-05 VITALS — BP 114/68 | HR 94 | Temp 98.0°F | Resp 16 | Ht 61.5 in | Wt 147.6 lb

## 2019-06-05 DIAGNOSIS — E039 Hypothyroidism, unspecified: Secondary | ICD-10-CM | POA: Diagnosis not present

## 2019-06-05 DIAGNOSIS — I10 Essential (primary) hypertension: Secondary | ICD-10-CM

## 2019-06-05 DIAGNOSIS — R32 Unspecified urinary incontinence: Secondary | ICD-10-CM | POA: Diagnosis not present

## 2019-06-05 DIAGNOSIS — E782 Mixed hyperlipidemia: Secondary | ICD-10-CM

## 2019-06-05 DIAGNOSIS — G894 Chronic pain syndrome: Secondary | ICD-10-CM | POA: Diagnosis not present

## 2019-06-05 DIAGNOSIS — M797 Fibromyalgia: Secondary | ICD-10-CM

## 2019-06-05 DIAGNOSIS — E1140 Type 2 diabetes mellitus with diabetic neuropathy, unspecified: Secondary | ICD-10-CM

## 2019-06-05 DIAGNOSIS — Z794 Long term (current) use of insulin: Secondary | ICD-10-CM

## 2019-06-05 LAB — MICROALBUMIN / CREATININE URINE RATIO
Creatinine,U: 108.6 mg/dL
Microalb Creat Ratio: 0.6 mg/g (ref 0.0–30.0)
Microalb, Ur: <0.7 mg/dL (ref 0.0–1.9)

## 2019-06-05 LAB — LIPID PANEL
Cholesterol: 140 mg/dL (ref 0–200)
HDL: 36.5 mg/dL — ABNORMAL LOW (ref >39.00)
LDL Cholesterol: 73 mg/dL (ref 0–99)
NonHDL: 103.39
Total CHOL/HDL Ratio: 4
Triglycerides: 151 mg/dL — ABNORMAL HIGH (ref 0.0–149.0)
VLDL: 30.2 mg/dL (ref 0.0–40.0)

## 2019-06-05 LAB — COMPREHENSIVE METABOLIC PANEL
ALT: 19 U/L (ref 0–35)
AST: 23 U/L (ref 0–37)
Albumin: 4.4 g/dL (ref 3.5–5.2)
Alkaline Phosphatase: 51 U/L (ref 39–117)
BUN: 21 mg/dL (ref 6–23)
CO2: 28 mEq/L (ref 19–32)
Calcium: 9.7 mg/dL (ref 8.4–10.5)
Chloride: 101 mEq/L (ref 96–112)
Creatinine, Ser: 0.95 mg/dL (ref 0.40–1.20)
GFR: 58.9 mL/min — ABNORMAL LOW (ref >60.00)
Glucose, Bld: 96 mg/dL (ref 70–99)
Potassium: 4.2 mEq/L (ref 3.5–5.1)
Sodium: 140 mEq/L (ref 135–145)
Total Bilirubin: 0.6 mg/dL (ref 0.2–1.2)
Total Protein: 6.7 g/dL (ref 6.0–8.3)

## 2019-06-05 LAB — CBC WITH DIFFERENTIAL/PLATELET
Basophils Absolute: 0.1 10*3/uL (ref 0.0–0.1)
Basophils Relative: 0.6 % (ref 0.0–3.0)
Eosinophils Absolute: 0.3 10*3/uL (ref 0.0–0.7)
Eosinophils Relative: 2.7 % (ref 0.0–5.0)
HCT: 36.9 % (ref 36.0–46.0)
Hemoglobin: 11.8 g/dL — ABNORMAL LOW (ref 12.0–15.0)
Lymphocytes Relative: 30.4 % (ref 12.0–46.0)
Lymphs Abs: 3 10*3/uL (ref 0.7–4.0)
MCHC: 31.9 g/dL (ref 30.0–36.0)
MCV: 84.1 fl (ref 78.0–100.0)
Monocytes Absolute: 0.6 10*3/uL (ref 0.1–1.0)
Monocytes Relative: 6.4 % (ref 3.0–12.0)
Neutro Abs: 6 10*3/uL (ref 1.4–7.7)
Neutrophils Relative %: 59.9 % (ref 43.0–77.0)
Platelets: 385 10*3/uL (ref 150.0–400.0)
RBC: 4.39 Mil/uL (ref 3.87–5.11)
RDW: 15.4 % (ref 11.5–15.5)
WBC: 10 10*3/uL (ref 4.0–10.5)

## 2019-06-05 LAB — TSH: TSH: 2.4 u[IU]/mL (ref 0.35–4.50)

## 2019-06-05 LAB — HEMOGLOBIN A1C: Hgb A1c MFr Bld: 7.8 % — ABNORMAL HIGH (ref 4.6–6.5)

## 2019-06-05 MED ORDER — BUPROPION HCL ER (XL) 150 MG PO TB24: 150.0000 mg | ORAL_TABLET | Freq: Every day | ORAL | 3 refills | Status: DC

## 2019-06-05 NOTE — Patient Instructions (Signed)
Please return in 4-6 weeks to recheck diabetes and your mood/pain.   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  The new medcatin should help as well.  We can also consider increasing the dose of your cymbalta.   If you have any questions or concerns, please don't hesitate to send me a message via MyChart or call the office at [phone]. Thank you for visiting with [place] today! It's our pleasure caring for you.

## 2019-06-05 NOTE — Progress Notes (Signed)
Subjective  CC:  Chief Complaint  Patient presents with  . Diabetes    Home blood sugars have increassed since last week after receiving back injections (280s)  . Hypertension  . Hyperlipidemia  . Urinary problems    Having issues for months of incontinence  . Headache    chronic pain/ fibromyalgias    HPI: Robin Morgan is a 63 y.o. female who presents to the office today for follow up of diabetes and problems listed above in the chief complaint.   Diabetes follow up: Her diabetic control is reported as Worse. But this is temporarily due to steroid shots. She denies exertional CP or SOB or symptomatic hypoglycemia. She denies foot sores or paresthesias.   Main concern is worsening chronic pain and fibro and headaches. Here with daughter. Managed by chronic pain, Dr. Tessa Lerner and Ms Marcello Moores. On morphine and low dose cymbalta. Now can't even complete her activities of cooking, cleaning w/o getting flares of headaches and back pain. Reviewed most recent MRI of cervical spine with mild spinal stenosis and DJD. Daughter reports high anxiety levels and constant worry. No change in quality of pain.   HTN and lipids: due for lab work. Have been well controlled.   Wt Readings from Last 3 Encounters:  06/05/19 147 lb 9.6 oz (67 kg)  05/15/19 148 lb (67.1 kg)  04/13/19 145 lb 6.4 oz (66 kg)    BP Readings from Last 3 Encounters:  06/05/19 114/68  05/15/19 125/75  04/13/19 128/70    Assessment  1. Fibromyalgia   2. Controlled type 2 diabetes mellitus with diabetic neuropathy, with long-term current use of insulin ([place])   3. Essential hypertension   4. Mixed hyperlipidemia   5. Chronic pain associated with significant psychosocial dysfunction   6. Hypothyroidism, acquired   7. Urinary incontinence, unspecified type      Plan    Fibro and chronic pain: counseling done. 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 Start wellbutrin.  Work on [employer]. Consider increasing dose of cymbalta as well. Close f/u  HTN and lipds: check labs. bp is controlled  Diabetes is currently adequately controlled. Recheck z1c today; early.   Check thyroid levels.   Follow up: No follow-ups on file.. Orders Placed This Encounter  Procedures  . CBC with Differential/Platelet  . Comprehensive metabolic panel  . Lipid panel  . TSH  . Hemoglobin A1c  . Microalbumin / creatinine urine ratio  . Ambulatory referral to Urology   Meds ordered this encounter  Medications  . buPROPion (WELLBUTRIN XL) 150 MG 24 hr tablet    Sig: Take 1 tablet (150 mg total) by mouth daily.    Dispense:  90 tablet    Refill:  3      Immunization History  Administered Date(s) Administered  . Fluad Quad(high Dose 65+) 04/06/2019  . Influenza Split 06/04/2009, 05/02/2012  . Influenza, Quadrivalent, Recombinant, Inj, Pf 05/12/2016, 07/07/2017  . Influenza, Seasonal, Injecte, Preservative Fre 06/06/2014, 04/24/2015  . Influenza,inj,Quad PF,6+ Mos 05/12/2016, 07/07/2017, 05/05/2018  . Influenza-Unspecified 09/03/2011, 04/19/2013  . Pneumococcal Polysaccharide-23 08/29/2009, 10/19/2018  . Tdap 05/11/2012    Diabetes Related Lab Review: Lab Results  Component Value Date   HGBA1C 7.3 (A) 04/06/2019   HGBA1C 6.9 (A) 10/14/2018   HGBA1C 7.0 (A) 06/16/2018    Lab Results  Component Value Date   MICROALBUR 1.8 06/16/2018   Lab Results  Component Value Date   CREATININE 0.81 09/02/2018  BUN 14 09/02/2018   NA 143 09/02/2018   K 4.3 09/02/2018   CL 106 09/02/2018   CO2 28 09/02/2018   Lab Results  Component Value Date   CHOL 132 06/16/2018   Lab Results  Component Value Date   HDL 39.90 06/16/2018   Lab Results  Component Value Date   LDLCALC 68 06/16/2018   Lab Results  Component Value Date   TRIG 120.0 06/16/2018   Lab Results  Component Value Date   CHOLHDL 3 06/16/2018   No results found for: LDLDIRECT The  10-year ASCVD risk score Mikey Bussing DC Jr., et al., 2013) is: 10.3%   Values used to calculate the score:     Age: 73 years     Sex: Female     Is Non-Hispanic African American: No     Diabetic: Yes     Tobacco smoker: No     Systolic Blood Pressure: 99991111 mmHg     Is BP treated: Yes     HDL Cholesterol: 39.9 mg/dL     Total Cholesterol: 132 mg/dL I have reviewed the PMH, Fam and Soc history. Patient Active Problem List   Diagnosis Date Noted  . Diabetic peripheral neuropathy associated with type 2 diabetes mellitus ([place]) 01/06/2017    Priority: High  . OSA (obstructive sleep apnea) 03/10/2016    Priority: High  . Controlled diabetes mellitus with diabetic neuropathy, with long-term current use of insulin ([place]) 10/11/2014    Priority: High  . HTN (hypertension) 10/11/2014    Priority: High  . Hyperlipidemia 10/11/2014    Priority: High  . DDD (degenerative disc disease), lumbar 01/15/2014    Priority: High  . Hypothyroidism, acquired 06/12/2013    Priority: High  . Cervical post-laminectomy syndrome 05/10/2013    Priority: High  . Chronic pain associated with significant psychosocial dysfunction 01/03/2010    Priority: High    Overview:  Chronic Pain Syndrome, Dr. Earl Lites  Overview:  Overview:  Overview:  Chronic Pain Syndrome, Dr. Earl Lites   . Esophageal stricture     Priority: Medium  . Tenosynovitis, wrist 10/07/2017    Priority: Medium  . Trigger thumb of left hand 10/07/2017    Priority: Medium  . Laryngopharyngeal reflux (LPR) 08/26/2017    Priority: Medium  . Dysphagia 08/26/2017    Priority: Medium  . Left carpal tunnel syndrome 03/08/2017    Priority: Medium  . Fibromyalgia 02/12/2016    Priority: Medium  . Osteopenia 12/01/2012    Priority: Medium    10/2017: osteopenia: T = -1.9; stable. Recheck 2 years T = -1.2 at hip, -1.8 at spine, 4/ 2014   . Chronic diarrhea 07/04/2012    Priority: Medium    Overview:  ? Related to gallbladder surgery. Has  been evaluated by Dr. Amedeo Plenty [facility]. Lomotil as needed.   . Chronic prescription opiate use 09/02/2018    Social History: Patient  reports that she has never smoked. She has never used smokeless tobacco. She reports that she does not drink alcohol or use drugs.  Review of Systems: Ophthalmic: negative for eye pain, loss of vision or double vision Cardiovascular: negative for chest pain Respiratory: negative for SOB or persistent cough Gastrointestinal: negative for abdominal pain Genitourinary: negative for dysuria or gross hematuria MSK: negative for foot lesions Neurologic: negative for weakness or gait disturbance  Objective  Vitals: BP 114/68   Pulse 94   Temp 98 F (36.7 C) (Tympanic)   Resp 16   Ht 5' 1.5" (1.562 m)  Wt 147 lb 9.6 oz (67 kg)   SpO2 97%   BMI 27.44 kg/m  General: well appearing, no acute distress  Psych:  Alert and oriented, normal mood and affect HEENT:  Normocephalic, atraumatic, moist mucous membranes, supple neck  Cardiovascular:  Nl S1 and S2, RRR without murmur, gallop or rub. no edema Respiratory:  Good breath sounds bilaterally, CTAB with normal effort, no rales Gastrointestinal: normal BS, soft, nontender Skin:  Warm, no rashes     Diabetic education: ongoing education regarding chronic disease management for diabetes was given today. We continue to reinforce the ABC's of diabetic management: A1c (<7 or 8 dependent upon patient), tight blood pressure control, and cholesterol management with goal LDL < 100 minimally. We discuss diet strategies, exercise recommendations, medication options and possible side effects. At each visit, we review recommended immunizations and preventive care recommendations for diabetics and stress that good diabetic control can prevent other problems. See below for this patient's data.    Commons side effects, risks, benefits, and alternatives for medications and treatment plan prescribed today were discussed, and the  patient expressed understanding of the given instructions. Patient is instructed to call or message via MyChart if he/she has any questions or concerns regarding our treatment plan. No barriers to understanding were identified. We discussed Red Flag symptoms and signs in detail. Patient expressed understanding regarding what to do in case of urgent or emergency type symptoms.   Medication list was reconciled, printed and provided to the patient in AVS. Patient instructions and summary information was reviewed with the patient as documented in the AVS. This note was prepared with assistance of Dragon voice recognition software. Occasional wrong-word or sound-a-like substitutions may have occurred due to the inherent limitations of voice recognition software

## 2019-06-12 ENCOUNTER — Encounter: Payer: Medicare HMO | Admitting: Registered Nurse

## 2019-06-14 ENCOUNTER — Telehealth: Payer: Self-pay | Admitting: Registered Nurse

## 2019-06-14 ENCOUNTER — Encounter: Payer: Self-pay | Admitting: Family Medicine

## 2019-06-14 DIAGNOSIS — M7918 Myalgia, other site: Secondary | ICD-10-CM

## 2019-06-14 DIAGNOSIS — M797 Fibromyalgia: Secondary | ICD-10-CM

## 2019-06-14 DIAGNOSIS — E1142 Type 2 diabetes mellitus with diabetic polyneuropathy: Secondary | ICD-10-CM

## 2019-06-14 MED ORDER — MORPHINE SULFATE 15 MG PO TABS: 15.0000 mg | ORAL_TABLET | Freq: Three times a day (TID) | ORAL | 0 refills | Status: DC | PRN

## 2019-06-14 NOTE — Telephone Encounter (Signed)
Placed a call to  Ms. Hun to follow up with My- Chart message. MSIR e-scribed, appointment was changed, she verbalizes understanding.

## 2019-06-15 ENCOUNTER — Other Ambulatory Visit: Payer: Self-pay | Admitting: Family Medicine

## 2019-06-19 ENCOUNTER — Encounter: Payer: Medicare HMO | Admitting: Registered Nurse

## 2019-06-26 ENCOUNTER — Encounter: Payer: Self-pay | Admitting: Registered Nurse

## 2019-06-26 ENCOUNTER — Other Ambulatory Visit: Payer: Self-pay

## 2019-06-26 ENCOUNTER — Other Ambulatory Visit: Payer: Self-pay | Admitting: Registered Nurse

## 2019-06-26 ENCOUNTER — Encounter: Payer: Medicare HMO | Attending: Physical Medicine and Rehabilitation | Admitting: Registered Nurse

## 2019-06-26 VITALS — BP 111/64 | HR 94 | Temp 97.3°F | Resp 14 | Ht 61.0 in | Wt 148.0 lb

## 2019-06-26 DIAGNOSIS — M546 Pain in thoracic spine: Secondary | ICD-10-CM

## 2019-06-26 DIAGNOSIS — Z79899 Other long term (current) drug therapy: Secondary | ICD-10-CM

## 2019-06-26 DIAGNOSIS — M5010 Cervical disc disorder with radiculopathy, unspecified cervical region: Secondary | ICD-10-CM | POA: Diagnosis not present

## 2019-06-26 DIAGNOSIS — G894 Chronic pain syndrome: Secondary | ICD-10-CM | POA: Diagnosis not present

## 2019-06-26 DIAGNOSIS — M961 Postlaminectomy syndrome, not elsewhere classified: Secondary | ICD-10-CM

## 2019-06-26 DIAGNOSIS — E1142 Type 2 diabetes mellitus with diabetic polyneuropathy: Secondary | ICD-10-CM | POA: Diagnosis not present

## 2019-06-26 DIAGNOSIS — Z7689 Persons encountering health services in other specified circumstances: Secondary | ICD-10-CM | POA: Diagnosis not present

## 2019-06-26 DIAGNOSIS — Z5181 Encounter for therapeutic drug level monitoring: Secondary | ICD-10-CM

## 2019-06-26 DIAGNOSIS — M797 Fibromyalgia: Secondary | ICD-10-CM | POA: Insufficient documentation

## 2019-06-26 DIAGNOSIS — M7918 Myalgia, other site: Secondary | ICD-10-CM | POA: Diagnosis not present

## 2019-06-26 DIAGNOSIS — G8929 Other chronic pain: Secondary | ICD-10-CM

## 2019-06-26 DIAGNOSIS — M542 Cervicalgia: Secondary | ICD-10-CM

## 2019-06-26 DIAGNOSIS — M609 Myositis, unspecified: Secondary | ICD-10-CM | POA: Diagnosis not present

## 2019-06-26 MED ORDER — MORPHINE SULFATE 15 MG PO TABS: 15.0000 mg | ORAL_TABLET | Freq: Three times a day (TID) | ORAL | 0 refills | Status: DC | PRN

## 2019-06-26 NOTE — Progress Notes (Signed)
Subjective:    Patient ID: Robin Morgan, female    DOB: Dec 27, 1955, 63 y.o.   MRN: [id number]  HPI: Robin Morgan is a 63 y.o. female who returns for follow up appointment for chronic pain and medication refill. She states her  pain is located in her neck radiating into her bilateral shoulders and mid- back pain, She rates her pain 7. Her current exercise regime is walking and performing stretching exercises.  Robin Morgan Morphine equivalent is 44.44 MME.  UDS ordered today.     Pain Inventory Average Pain 9 Pain Right Now 7 My pain is constant, sharp and aching  In the last 24 hours, has pain interfered with the following? General activity 9 Relation with others 9 Enjoyment of life 10 What TIME of day is your pain at its worst? all Sleep (in general) Fair  Pain is worse with: bending and standing Pain improves with: medication Relief from Meds: 7  Mobility walk without assistance ability to climb steps?  yes do you drive?  yes  Function disabled: date disabled .  Neuro/Psych bladder control problems  Prior Studies Any changes since last visit?  no  Physicians involved in your care Any changes since last visit?  no   Family History  Problem Relation Age of Onset  . Alzheimer's disease Mother   . Diabetes Father   . Heart disease Maternal Grandmother   . Breast cancer Paternal Grandmother 77   Social History   Socioeconomic History  . Marital status: Divorced    Spouse name: Not on file  . Number of children: 1  . Years of education: Not on file  . Highest education level: Not on file  Occupational History  . Occupation: disabled  Social Needs  . Financial resource strain: Not on file  . Food insecurity    Worry: Not on file    Inability: Not on file  . Transportation needs    Medical: Not on file    Non-medical: Not on file  Tobacco Use  . Smoking status: Never Smoker  . Smokeless tobacco: Never Used  Substance and Sexual Activity  .  Alcohol use: No  . Drug use: No  . Sexual activity: Not Currently  Lifestyle  . Physical activity    Days per week: Not on file    Minutes per session: Not on file  . Stress: Not on file  Relationships  . Social Herbalist on phone: Not on file    Gets together: Not on file    Attends religious service: Not on file    Active member of club or organization: Not on file    Attends meetings of clubs or organizations: Not on file    Relationship status: Not on file  Other Topics Concern  . Not on file  Social History Narrative  . Not on file   Past Surgical History:  Procedure Laterality Date  . ABDOMINAL HYSTERECTOMY     1986  . APPENDECTOMY     1983  . BREAST EXCISIONAL BIOPSY Right 1990's  . BREAST SURGERY     rt breast cyst done in the 90's  . CHOLECYSTECTOMY    . GALLBLADDER SURGERY     1983  . ROTATOR CUFF REPAIR     right  . SPINE SURGERY    . TONSILLECTOMY     1973   Past Medical History:  Diagnosis Date  . Calcific tendonitis   . Cervical spondylosis without myelopathy   .  Depression   . Diabetes mellitus   . Esophageal stricture   . Fibromyalgia   . GERD (gastroesophageal reflux disease)   . Hiatal hernia   . Hyperlipidemia   . Hypertension    BP 111/64   Pulse 94   Temp (!) 97.3 F (36.3 C)   Resp 14   Ht 5\' 1"  (1.549 m)   Wt 148 lb (67.1 kg)   SpO2 93%   BMI 27.96 kg/m   Opioid Risk Score:   Fall Risk Score:  `1  Depression screen PHQ 2/9  Depression screen [facility] 2/9 06/16/2018 03/07/2018 02/08/2018 12/07/2017 11/09/2017 10/27/2017 07/29/2017  Decreased Interest 0 1 0 0 0 3 3  Down, Depressed, Hopeless 0 1 0 0 0 3 3  PHQ - 2 Score 0 2 0 0 0 6 6  Altered sleeping 0 - 0 - 0 - -  Tired, decreased energy 0 - 0 - 0 - -  Change in appetite 0 - 0 - 0 - -  Feeling bad or failure about yourself  0 - 0 - 0 - -  Trouble concentrating 0 - 0 - 0 - -  Moving slowly or fidgety/restless 0 - 0 - 0 - -  Suicidal thoughts 0 - 0 - 0 - -  PHQ-9 Score  0 - 0 - 0 - -  Difficult doing work/chores Not difficult at all - Not difficult at all - - - -  Some recent data might be hidden    Review of Systems  Constitutional: Positive for diaphoresis and unexpected weight change.  HENT: Negative.   Eyes: Negative.   Respiratory: Positive for apnea.   Gastrointestinal: Positive for constipation.  Endocrine:       High blood sugar Low blood sugar  Genitourinary: Positive for difficulty urinating.  Musculoskeletal: Positive for arthralgias, back pain and neck pain.       Spasms   Skin: Negative.   Allergic/Immunologic: Negative.   Neurological: Negative.   Hematological: Negative.   Psychiatric/Behavioral: Positive for confusion.  All other systems reviewed and are negative.      Objective:   Physical Exam Vitals signs and nursing note reviewed.  Constitutional:      Appearance: Normal appearance.  Neck:     Musculoskeletal: Normal range of motion and neck supple.     Comments: Cervical Paraspinal Tenderness: C-5-C-6 Cardiovascular:     Rate and Rhythm: Normal rate and regular rhythm.     Pulses: Normal pulses.     Heart sounds: Normal heart sounds.  Pulmonary:     Effort: Pulmonary effort is normal.     Breath sounds: Normal breath sounds.  Musculoskeletal:     Comments: Normal Muscle Bulk and Muscle Testing Reveals:  Upper Extremities: Full ROM and Muscle Strength 5/5 Bilateral AC Joint Tenderness Thoracic Paraspinal Tenderness: T-1-T-3 T-7-T-9 Lower Extremities: Full ROM and Muscle Strength 5/5 Arises from Table with Ease  Narrow Based  Gait   Skin:    General: Skin is warm and dry.  Neurological:     Mental Status: She is alert and oriented to person, place, and time.  Psychiatric:        Mood and Affect: Mood normal.        Behavior: Behavior normal.           Assessment & Plan:  1. Fibromyalgia with myofascial pain: Continuehomeexercise programand heat therapy. Continuecurrent medication regimen  withFlexeril.06/26/2019 2. Rotator cuff syndrome on the right: Continue with stretching exercises and heat therapy.06/26/2019  3. Cervicalgia. Post-laminectomy syndrome, facet arthropathy:Cervical Radiculopathy: Continue current medication regimen withGabapentin.06/26/2019 S/ P EMG with Dr. Naaman Plummer on 03/08/2017: Diagnosed with Moderate to Severe CTS of Left wrist:06/26/2019 4. Depression: Continuecurrent medication regimen withCymbalta. PCPFollowing. Currentdose 90 mg daily.06/26/2019 5. Mid/low back pain/ Lumbar Spondylosis: Continue Current Medication and stretching and heat therapy.06/26/2019 Refilled: Morphine Sulfate IR15 mg one tablet twice a day as needed for pain #60. 6. Lumbar Radiculopathy:Continuecurrent medication regimen withGabapentin. 06/26/2019 7.OA of right hand: Continue Voltaren gel/ May substitute with Voltaren Tablet, she realizes she can't use both and verbalizes understanding. Continue withheat therapy. 06/26/2019 8. Migraines:No complaints today. Continue withheadache Journal.Continue to Monitor 06/26/2019 9. Left CTS:S/P EMG on 03/08/2017: S/P Carpal Tunnel Release on 06/14/2017 via . Dr. Apolonio Schneiders.06/26/2019 10.. Muscle Spasm: ContinueTizanidine. Continue to Monitor.06/26/2019  48minutes of face to face patient care time was spent during this visit. All questions were encouraged and answered.   F/U in 81month

## 2019-06-29 DIAGNOSIS — R3915 Urgency of urination: Secondary | ICD-10-CM | POA: Diagnosis not present

## 2019-06-29 DIAGNOSIS — R1032 Left lower quadrant pain: Secondary | ICD-10-CM | POA: Diagnosis not present

## 2019-06-29 DIAGNOSIS — R351 Nocturia: Secondary | ICD-10-CM | POA: Diagnosis not present

## 2019-06-29 DIAGNOSIS — N3946 Mixed incontinence: Secondary | ICD-10-CM | POA: Diagnosis not present

## 2019-06-30 ENCOUNTER — Telehealth: Payer: Self-pay | Admitting: *Deleted

## 2019-06-30 LAB — TOXASSURE SELECT,+ANTIDEPR,UR

## 2019-06-30 NOTE — Telephone Encounter (Signed)
Urine drug screen for this encounter is consistent for prescribed medication 

## 2019-07-03 DIAGNOSIS — H35033 Hypertensive retinopathy, bilateral: Secondary | ICD-10-CM | POA: Diagnosis not present

## 2019-07-03 DIAGNOSIS — H43393 Other vitreous opacities, bilateral: Secondary | ICD-10-CM | POA: Diagnosis not present

## 2019-07-03 DIAGNOSIS — H2513 Age-related nuclear cataract, bilateral: Secondary | ICD-10-CM | POA: Diagnosis not present

## 2019-07-03 DIAGNOSIS — E119 Type 2 diabetes mellitus without complications: Secondary | ICD-10-CM | POA: Diagnosis not present

## 2019-07-03 LAB — HM DIABETES EYE EXAM

## 2019-07-05 ENCOUNTER — Encounter: Payer: Medicare HMO | Admitting: Family Medicine

## 2019-07-10 ENCOUNTER — Other Ambulatory Visit: Payer: Self-pay | Admitting: Registered Nurse

## 2019-07-10 DIAGNOSIS — E1142 Type 2 diabetes mellitus with diabetic polyneuropathy: Secondary | ICD-10-CM

## 2019-07-10 DIAGNOSIS — M7918 Myalgia, other site: Secondary | ICD-10-CM

## 2019-07-10 DIAGNOSIS — M797 Fibromyalgia: Secondary | ICD-10-CM

## 2019-07-11 ENCOUNTER — Other Ambulatory Visit: Payer: Self-pay

## 2019-07-12 ENCOUNTER — Encounter: Payer: Self-pay | Admitting: Family Medicine

## 2019-07-12 ENCOUNTER — Ambulatory Visit (INDEPENDENT_AMBULATORY_CARE_PROVIDER_SITE_OTHER): Payer: Medicare HMO | Admitting: Family Medicine

## 2019-07-12 VITALS — BP 118/74 | HR 99 | Temp 98.4°F | Ht 61.5 in | Wt 149.0 lb

## 2019-07-12 DIAGNOSIS — F339 Major depressive disorder, recurrent, unspecified: Secondary | ICD-10-CM | POA: Diagnosis not present

## 2019-07-12 DIAGNOSIS — M797 Fibromyalgia: Secondary | ICD-10-CM

## 2019-07-12 DIAGNOSIS — R69 Illness, unspecified: Secondary | ICD-10-CM | POA: Diagnosis not present

## 2019-07-12 MED ORDER — DULOXETINE HCL 30 MG PO CPEP: 60.0000 mg | ORAL_CAPSULE | Freq: Two times a day (BID) | ORAL | 3 refills | Status: DC

## 2019-07-12 NOTE — Progress Notes (Signed)
Subjective  CC:  Chief Complaint  Patient presents with  . Fibromyalgia  . Depression    HPI: Robin Morgan is a 63 y.o. female who presents to the office today to address the problems listed above in the chief complaint.  Fibro: continues with pain. Pain mgt now sending her to neuro to evaluate her persistent headaches unrelieved with pain meds. C/o intermittent memory problems and feeling out of it but no specific complaints and at times feels fine. On multiple medications that could be causing this.   Depression; didn't tolerate the wellbutrin. Stopped after one week.    Assessment  1. Fibromyalgia   2. Depression, recurrent ([place])      Plan   Fibro/chronic pain:  Per specialists. Counseled regarding behavioral ways to manage pain.  Depression; trial of increased cymbalta 60 bid to see if helps. Counseling done.   Follow up: Return in about 3 months (around 10/10/2019) for follow up Diabetes, mood follow up.  08/28/2019  No orders of the defined types were placed in this encounter.  Meds ordered this encounter  Medications  . DULoxetine (CYMBALTA) 30 MG capsule    Sig: Take 2 capsules (60 mg total) by mouth 2 (two) times daily.    Dispense:  270 capsule    Refill:  3      I reviewed the patients updated PMH, FH, and SocHx.    Patient Active Problem List   Diagnosis Date Noted  . Diabetic peripheral neuropathy associated with type 2 diabetes mellitus ([place]) 01/06/2017    Priority: High  . OSA (obstructive sleep apnea) 03/10/2016    Priority: High  . Controlled diabetes mellitus with diabetic neuropathy, with long-term current use of insulin ([place]) 10/11/2014    Priority: High  . HTN (hypertension) 10/11/2014    Priority: High  . Hyperlipidemia 10/11/2014    Priority: High  . DDD (degenerative disc disease), lumbar 01/15/2014    Priority: High  . Hypothyroidism, acquired 06/12/2013    Priority: High  . Cervical post-laminectomy syndrome 05/10/2013   Priority: High  . Chronic pain associated with significant psychosocial dysfunction 01/03/2010    Priority: High  . Esophageal stricture     Priority: Medium  . Tenosynovitis, wrist 10/07/2017    Priority: Medium  . Trigger thumb of left hand 10/07/2017    Priority: Medium  . Laryngopharyngeal reflux (LPR) 08/26/2017    Priority: Medium  . Dysphagia 08/26/2017    Priority: Medium  . Left carpal tunnel syndrome 03/08/2017    Priority: Medium  . Fibromyalgia 02/12/2016    Priority: Medium  . Osteopenia 12/01/2012    Priority: Medium  . Chronic diarrhea 07/04/2012    Priority: Medium  . Chronic prescription opiate use 09/02/2018   Current Meds  Medication Sig  . atorvastatin (LIPITOR) 40 MG tablet TAKE 1 TABLET BY MOUTH ONCE A DAY  . b complex vitamins tablet Take 1 tablet by mouth daily.  . Calcium Carbonate-Vitamin D (CALCIUM-VITAMIN D) 500-200 MG-UNIT per tablet Take 1 tablet by mouth 2 (two) times daily with a meal.  . co-enzyme Q-10 30 MG capsule Take 30 mg by mouth 3 (three) times daily.  . diclofenac sodium (VOLTAREN) 1 % GEL Apply 2 g topically 4 (four) times daily.  . DULoxetine (CYMBALTA) 30 MG capsule Take 2 capsules (60 mg total) by mouth 2 (two) times daily.  Marland Kitchen esomeprazole (NEXIUM) 40 MG capsule TAKE 1 CAPSULE BY MOUTH 2 TIMES DAILY BEFORE A MEAL. MORNING AND EVENING  .  fenofibrate 160 MG tablet TAKE 1 TABLET BY MOUTH ONCE A DAY  . gabapentin (NEURONTIN) 300 MG capsule TAKE 2 CAPSULES IN THE MORNING, 2 CAPSULES AT LUNCH AND 2 capsules  AT BEDTIME  . gentamicin ointment (GARAMYCIN) 0.1 % Apply 1 application topically 3 (three) times daily.  Marland Kitchen LANTUS SOLOSTAR 100 UNIT/ML Solostar Pen INJECT 64 UNITS INTO THE SKIN ONCE DAILYAS DIRECTED  . levothyroxine (SYNTHROID) 88 MCG tablet TAKE 1 TABLET BY MOUTH ONCE A DAY. TAKE ON AN EMPTY STOMACH WITH A GLASS OF WATER ATLEAST 30-60 MIN BEFORE BREAKFAST  . lisinopril (ZESTRIL) 10 MG tablet TAKE 1 TABLET BY MOUTH ONCE DAILY  .  magnesium gluconate (MAGONATE) 500 MG tablet Take 1 tablet (500 mg total) by mouth daily.  . metFORMIN (GLUCOPHAGE) 1000 MG tablet Take 1 tablet by mouth 2 (two) times daily.  Marland Kitchen morphine (MSIR) 15 MG tablet Take 1 tablet (15 mg total) by mouth 3 (three) times daily as needed for moderate pain.  . naloxegol oxalate (MOVANTIK) 25 MG TABS tablet Take 1 tablet (25 mg total) by mouth daily.  . Omega-3 Fatty Acids (FISH OIL) 1000 MG CAPS Take 2 capsules by mouth daily.  Marland Kitchen tiZANidine (ZANAFLEX) 2 MG tablet Take 0.5-1 tablets (1-2 mg total) by mouth every 6 (six) hours as needed for muscle spasms.  Marland Kitchen triamcinolone cream (KENALOG) 0.1 % Apply 1 application topically 2 (two) times daily. As needed  . zolpidem (AMBIEN) 10 MG tablet TAKE 1 TABLET BY MOUTH AT BEDTIME AS NEEDED FOR SLEEP  . [DISCONTINUED] buPROPion (WELLBUTRIN XL) 150 MG 24 hr tablet Take 1 tablet (150 mg total) by mouth daily.  . [DISCONTINUED] DULoxetine (CYMBALTA) 30 MG capsule TAKE 3 CAPSULES BY MOUTH DAILY    Allergies: Patient is allergic to compazine; prochlorperazine; and naproxen. Family History: Patient family history includes Alzheimer's disease in her mother; Breast cancer (age of onset: 28) in her paternal grandmother; Diabetes in her father; Heart disease in her maternal grandmother. Social History:  Patient  reports that she has never smoked. She has never used smokeless tobacco. She reports that she does not drink alcohol or use drugs.  Review of Systems: Constitutional: Negative for fever malaise or anorexia Cardiovascular: negative for chest pain Respiratory: negative for SOB or persistent cough Gastrointestinal: negative for abdominal pain  Objective  Vitals: BP 118/74 (BP Location: Left Arm, Patient Position: Sitting, Cuff Size: Normal)   Pulse 99   Temp 98.4 F (36.9 C) (Temporal)   Ht 5' 1.5" (1.562 m)   Wt 149 lb (67.6 kg)   SpO2 96%   BMI 27.70 kg/m  General: no acute distress , A&Ox3 Psych: well  dressed/groomed, nl affect. Nl speech. Nl cognition    Commons side effects, risks, benefits, and alternatives for medications and treatment plan prescribed today were discussed, and the patient expressed understanding of the given instructions. Patient is instructed to call or message via MyChart if he/she has any questions or concerns regarding our treatment plan. No barriers to understanding were identified. We discussed Red Flag symptoms and signs in detail. Patient expressed understanding regarding what to do in case of urgent or emergency type symptoms.   Medication list was reconciled, printed and provided to the patient in AVS. Patient instructions and summary information was reviewed with the patient as documented in the AVS. This note was prepared with assistance of Dragon voice recognition software. Occasional wrong-word or sound-a-like substitutions may have occurred due to the inherent limitations of voice recognition software  This  visit occurred during the SARS-CoV-2 public health emergency.  Safety protocols were in place, including screening questions prior to the visit, additional usage of staff PPE, and extensive cleaning of exam room while observing appropriate contact time as indicated for disinfecting solutions.

## 2019-07-12 NOTE — Patient Instructions (Signed)
Please return in 3 months for recheck.   Hope you have a nice Christmas.   If you have any questions or concerns, please don't hesitate to send me a message via MyChart or call the office at [phone]. Thank you for visiting with [place] today! It's our pleasure caring for you.

## 2019-07-24 ENCOUNTER — Ambulatory Visit: Payer: Medicaid Other | Admitting: Adult Health

## 2019-07-24 ENCOUNTER — Encounter: Payer: Self-pay | Admitting: Adult Health

## 2019-08-07 DIAGNOSIS — H35033 Hypertensive retinopathy, bilateral: Secondary | ICD-10-CM | POA: Diagnosis not present

## 2019-08-07 DIAGNOSIS — H2513 Age-related nuclear cataract, bilateral: Secondary | ICD-10-CM | POA: Diagnosis not present

## 2019-08-07 DIAGNOSIS — H25013 Cortical age-related cataract, bilateral: Secondary | ICD-10-CM | POA: Diagnosis not present

## 2019-08-07 DIAGNOSIS — H2511 Age-related nuclear cataract, right eye: Secondary | ICD-10-CM | POA: Diagnosis not present

## 2019-08-07 DIAGNOSIS — H04123 Dry eye syndrome of bilateral lacrimal glands: Secondary | ICD-10-CM | POA: Diagnosis not present

## 2019-08-07 LAB — HM DIABETES EYE EXAM

## 2019-08-08 ENCOUNTER — Ambulatory Visit
Admission: RE | Admit: 2019-08-08 | Discharge: 2019-08-08 | Disposition: A | Discharge status: Home / Self Care | Payer: Medicare HMO | Source: Ambulatory Visit | Attending: Registered Nurse | Admitting: Registered Nurse

## 2019-08-08 ENCOUNTER — Encounter: Payer: Medicare HMO | Attending: Physical Medicine and Rehabilitation | Admitting: Registered Nurse

## 2019-08-08 ENCOUNTER — Other Ambulatory Visit: Payer: Self-pay

## 2019-08-08 ENCOUNTER — Encounter: Payer: Self-pay | Admitting: Registered Nurse

## 2019-08-08 VITALS — BP 126/81 | HR 90 | Temp 97.5°F | Ht 61.0 in | Wt 148.0 lb

## 2019-08-08 DIAGNOSIS — M7918 Myalgia, other site: Secondary | ICD-10-CM

## 2019-08-08 DIAGNOSIS — M25511 Pain in right shoulder: Secondary | ICD-10-CM | POA: Diagnosis not present

## 2019-08-08 DIAGNOSIS — M797 Fibromyalgia: Secondary | ICD-10-CM | POA: Diagnosis not present

## 2019-08-08 DIAGNOSIS — M5010 Cervical disc disorder with radiculopathy, unspecified cervical region: Secondary | ICD-10-CM

## 2019-08-08 DIAGNOSIS — G8929 Other chronic pain: Secondary | ICD-10-CM

## 2019-08-08 DIAGNOSIS — M961 Postlaminectomy syndrome, not elsewhere classified: Secondary | ICD-10-CM | POA: Insufficient documentation

## 2019-08-08 DIAGNOSIS — E1142 Type 2 diabetes mellitus with diabetic polyneuropathy: Secondary | ICD-10-CM | POA: Diagnosis not present

## 2019-08-08 DIAGNOSIS — M7541 Impingement syndrome of right shoulder: Secondary | ICD-10-CM

## 2019-08-08 DIAGNOSIS — M609 Myositis, unspecified: Secondary | ICD-10-CM | POA: Insufficient documentation

## 2019-08-08 DIAGNOSIS — M546 Pain in thoracic spine: Secondary | ICD-10-CM | POA: Diagnosis not present

## 2019-08-08 NOTE — Progress Notes (Signed)
Subjective:    Patient ID: Robin Morgan, female    DOB: July 19, 1956, 63 y.o.   MRN: [id number]  HPI: Robin Morgan is a 63 y.o. female who returns for follow up appointment for chronic pain and medication refill. She states her pain is located in her right shoulder with increase intensity of pain for a week, she denies falling, we will order X-ray today. Also reports upper- back pain mainly right side. She rates her pain 7. Her current exercise regime is walking and performing stretching exercises.  [name] Morphine equivalent is 44.44 MME.  Last UDS was Performed on 06/26/2019, it was consistent.   Pain Inventory Average Pain 9 Pain Right Now 7 My pain is sharp and aching  In the last 24 hours, has pain interfered with the following? General activity 9 Relation with others 8 Enjoyment of life 8 What TIME of day is your pain at its worst? morning Sleep (in general) Fair  Pain is worse with: sitting and standing Pain improves with: medication Relief from Meds: 9  Mobility ability to climb steps?  yes do you drive?  yes  Function disabled: date disabled .  Neuro/Psych bladder control problems spasms loss of taste or smell  Prior Studies Any changes since last visit?  no  Physicians involved in your care Any changes since last visit?  no   Family History  Problem Relation Age of Onset  . Alzheimer's disease Mother   . Diabetes Father   . Heart disease Maternal Grandmother   . Breast cancer Paternal Grandmother 21   Social History   Socioeconomic History  . Marital status: Divorced    Spouse name: Not on file  . Number of children: 1  . Years of education: Not on file  . Highest education level: Not on file  Occupational History  . Occupation: disabled  Tobacco Use  . Smoking status: Never Smoker  . Smokeless tobacco: Never Used  Substance and Sexual Activity  . Alcohol use: No  . Drug use: No  . Sexual activity: Not Currently  Other Topics  Concern  . Not on file  Social History Narrative  . Not on file   Social Determinants of Health   Financial Resource Strain:   . Difficulty of Paying Living Expenses: Not on file  Food Insecurity:   . Worried About Charity fundraiser in the Last Year: Not on file  . Ran Out of Food in the Last Year: Not on file  Transportation Needs:   . Lack of Transportation (Medical): Not on file  . Lack of Transportation (Non-Medical): Not on file  Physical Activity:   . Days of Exercise per Week: Not on file  . Minutes of Exercise per Session: Not on file  Stress:   . Feeling of Stress : Not on file  Social Connections:   . Frequency of Communication with Friends and Family: Not on file  . Frequency of Social Gatherings with Friends and Family: Not on file  . Attends Religious Services: Not on file  . Active Member of Clubs or Organizations: Not on file  . Attends Archivist Meetings: Not on file  . Marital Status: Not on file   Past Surgical History:  Procedure Laterality Date  . ABDOMINAL HYSTERECTOMY     1986  . APPENDECTOMY     1983  . BREAST EXCISIONAL BIOPSY Right 1990's  . BREAST SURGERY     rt breast cyst done in the 90's  .  CHOLECYSTECTOMY    . GALLBLADDER SURGERY     1983  . ROTATOR CUFF REPAIR     right  . SPINE SURGERY    . TONSILLECTOMY     1973   Past Medical History:  Diagnosis Date  . Calcific tendonitis   . Cervical spondylosis without myelopathy   . Depression   . Diabetes mellitus   . Esophageal stricture   . Fibromyalgia   . GERD (gastroesophageal reflux disease)   . Hiatal hernia   . Hyperlipidemia   . Hypertension    There were no vitals taken for this visit.  Opioid Risk Score:   Fall Risk Score:  `1  Depression screen PHQ 2/9  Depression screen [facility] 2/9 06/16/2018 03/07/2018 02/08/2018 12/07/2017 11/09/2017 10/27/2017 07/29/2017  Decreased Interest 0 1 0 0 0 3 3  Down, Depressed, Hopeless 0 1 0 0 0 3 3  PHQ - 2 Score 0 2 0 0 0 6 6    Altered sleeping 0 - 0 - 0 - -  Tired, decreased energy 0 - 0 - 0 - -  Change in appetite 0 - 0 - 0 - -  Feeling bad or failure about yourself  0 - 0 - 0 - -  Trouble concentrating 0 - 0 - 0 - -  Moving slowly or fidgety/restless 0 - 0 - 0 - -  Suicidal thoughts 0 - 0 - 0 - -  PHQ-9 Score 0 - 0 - 0 - -  Difficult doing work/chores Not difficult at all - Not difficult at all - - - -  Some recent data might be hidden     Review of Systems  Constitutional: Positive for unexpected weight change.  HENT: Negative.   Eyes: Negative.   Respiratory: Negative.   Cardiovascular: Negative.   Gastrointestinal: Negative.   Endocrine: Negative.   Genitourinary: Positive for difficulty urinating.  Musculoskeletal: Positive for arthralgias, back pain and myalgias.  Skin: Negative.   Allergic/Immunologic: Negative.   Neurological: Negative.   Hematological: Negative.   Psychiatric/Behavioral: Negative.   All other systems reviewed and are negative.      Objective:   Physical Exam Vitals and nursing note reviewed.  Constitutional:      Appearance: Normal appearance.  Cardiovascular:     Rate and Rhythm: Normal rate and regular rhythm.     Pulses: Normal pulses.     Heart sounds: Normal heart sounds.  Pulmonary:     Effort: Pulmonary effort is normal.     Breath sounds: Normal breath sounds.  Musculoskeletal:     Cervical back: Normal range of motion and neck supple.     Comments: Normal Muscle Bulk and Muscle Testing Reveals:  Upper Extremities: Left: Full ROM and Muscle Strength 5/5 Right: AC Joint Tenderness: Decreased ROM of Abduction and Adduction of External and Internal Rotation Thoracic Paraspinal Tenderness: : T-1-T-3   Lower Extremities Gait   Skin:    General: Skin is warm and dry.  Neurological:     Mental Status: She is alert and oriented to person, place, and time.  Psychiatric:        Mood and Affect: Mood normal.        Behavior: Behavior normal.            Assessment & Plan:  1. Fibromyalgia with myofascial pain: Continuehomeexercise programand heat therapy. Continuecurrent medication regimen withFlexeril.08/08/2019 2. Rotator cuff syndrome on the right: Continue with stretching exercises and heat therapy.08/08/2019 3. Cervicalgia. Post-laminectomy syndrome, facet arthropathy:Cervical  Radiculopathy: Continue current medication regimen withGabapentin.08/08/2019 S/ P EMG with Dr. Naaman Plummer on 03/08/2017: Diagnosed with Moderate to Severe CTS of Left wrist:08/08/2019 4. Depression: Continuecurrent medication regimen withCymbalta. PCPFollowing. Currentdose 90 mg daily.08/08/2019 5. Mid/low back pain/ Lumbar Spondylosis: Continue Current Medication and stretching and heat therapy.08/08/2019 Refilled: Morphine Sulfate IR15 mg one tablet three times  a day as needed for pain # 80. 6. Lumbar Radiculopathy:Continuecurrent medication regimen withGabapentin.08/08/2019 7.OA of right hand: Continue Voltaren gel/ May substitute with Voltaren Tablet, she realizes she can't use both and verbalizes understanding. Continue withheat therapy. 08/08/2019 8. Migraines:No complaints today. Continue withheadache Journal.Continue to Monitor 08/08/2019 9. Left CTS:S/P EMG on 03/08/2017: S/P Carpal Tunnel Release on 06/14/2017 via . Dr. Apolonio Schneiders.08/08/2019 10.. Muscle Spasm: ContinueTizanidine. Continue to Monitor.08/08/2019 11. Right Shoulder Pain/ Right Shoulder Impingement : RX: X-ray   39minutes of face to face patient care time was spent during this visit. All questions were encouraged and answered.   F/U in 58month

## 2019-08-09 ENCOUNTER — Telehealth: Payer: Self-pay | Admitting: Registered Nurse

## 2019-08-09 NOTE — Telephone Encounter (Signed)
Reviewed X-ray Results with Ms. Kugel: She states she will F/U with her [facility].

## 2019-08-16 DIAGNOSIS — M25511 Pain in right shoulder: Secondary | ICD-10-CM | POA: Diagnosis not present

## 2019-08-18 ENCOUNTER — Other Ambulatory Visit: Payer: Self-pay | Admitting: Family Medicine

## 2019-08-18 DIAGNOSIS — G4733 Obstructive sleep apnea (adult) (pediatric): Secondary | ICD-10-CM | POA: Diagnosis not present

## 2019-08-23 ENCOUNTER — Encounter: Payer: Medicare HMO | Admitting: Family Medicine

## 2019-08-25 ENCOUNTER — Other Ambulatory Visit: Payer: Self-pay | Admitting: Family Medicine

## 2019-08-28 ENCOUNTER — Encounter: Payer: Medicare HMO | Admitting: Family Medicine

## 2019-08-30 DIAGNOSIS — M25511 Pain in right shoulder: Secondary | ICD-10-CM | POA: Diagnosis not present

## 2019-09-04 NOTE — Telephone Encounter (Signed)
message

## 2019-09-04 NOTE — Telephone Encounter (Signed)
Message from patient

## 2019-09-05 ENCOUNTER — Other Ambulatory Visit: Payer: Self-pay

## 2019-09-05 ENCOUNTER — Encounter: Payer: Self-pay | Admitting: Registered Nurse

## 2019-09-05 ENCOUNTER — Encounter: Payer: Medicare HMO | Attending: Physical Medicine and Rehabilitation | Admitting: Registered Nurse

## 2019-09-05 VITALS — BP 118/67 | HR 92 | Temp 97.9°F | Ht 61.0 in | Wt 148.0 lb

## 2019-09-05 DIAGNOSIS — Z5181 Encounter for therapeutic drug level monitoring: Secondary | ICD-10-CM

## 2019-09-05 DIAGNOSIS — M5010 Cervical disc disorder with radiculopathy, unspecified cervical region: Secondary | ICD-10-CM

## 2019-09-05 DIAGNOSIS — M7918 Myalgia, other site: Secondary | ICD-10-CM | POA: Diagnosis not present

## 2019-09-05 DIAGNOSIS — M542 Cervicalgia: Secondary | ICD-10-CM

## 2019-09-05 DIAGNOSIS — M797 Fibromyalgia: Secondary | ICD-10-CM | POA: Diagnosis not present

## 2019-09-05 DIAGNOSIS — M25511 Pain in right shoulder: Secondary | ICD-10-CM | POA: Diagnosis not present

## 2019-09-05 DIAGNOSIS — E1142 Type 2 diabetes mellitus with diabetic polyneuropathy: Secondary | ICD-10-CM | POA: Diagnosis not present

## 2019-09-05 DIAGNOSIS — M961 Postlaminectomy syndrome, not elsewhere classified: Secondary | ICD-10-CM | POA: Diagnosis not present

## 2019-09-05 DIAGNOSIS — M609 Myositis, unspecified: Secondary | ICD-10-CM | POA: Insufficient documentation

## 2019-09-05 DIAGNOSIS — M546 Pain in thoracic spine: Secondary | ICD-10-CM | POA: Diagnosis not present

## 2019-09-05 DIAGNOSIS — Z79899 Other long term (current) drug therapy: Secondary | ICD-10-CM | POA: Diagnosis not present

## 2019-09-05 DIAGNOSIS — G8929 Other chronic pain: Secondary | ICD-10-CM | POA: Diagnosis not present

## 2019-09-05 DIAGNOSIS — G894 Chronic pain syndrome: Secondary | ICD-10-CM

## 2019-09-05 MED ORDER — MORPHINE SULFATE 15 MG PO TABS: 15.0000 mg | ORAL_TABLET | Freq: Three times a day (TID) | ORAL | 0 refills | Status: DC | PRN

## 2019-09-05 MED ORDER — GABAPENTIN 300 MG PO CAPS: ORAL_CAPSULE | ORAL | 3 refills | Status: DC

## 2019-09-05 NOTE — Patient Instructions (Signed)
COVID-19 Vaccine Information can be found at: [email or web address] For questions related to vaccine distribution or appointments, please email [email or web address] or call [phone].    

## 2019-09-05 NOTE — Progress Notes (Signed)
Subjective:    Patient ID: Robin Morgan, female    DOB: January 19, 1956, 64 y.o.   MRN: [id number]  HPI: Robin Morgan is a 64 y.o. female who returns for follow up appointment for chronic pain and medication refill. She states her pain is located in her neck radiating into her occipital region and upper- mid back pain. She rates her pain 7. Her current exercise regime is walking and performing stretching exercises.  Robin Morgan Morphine equivalent is 44.44 MME.    Last UDS was Performed on 06/26/2019, it was consistent.   Pain Inventory Average Pain 9 Pain Right Now 7 My pain is constant, stabbing and aching  In the last 24 hours, has pain interfered with the following? General activity 9 Relation with others 9 Enjoyment of life 9 What TIME of day is your pain at its worst? evening Sleep (in general) Fair  Pain is worse with: bending and standing Pain improves with: heat/ice and TENS Relief from Meds: 7  Mobility ability to climb steps?  yes do you drive?  yes  Function disabled: date disabled .  Neuro/Psych bladder control problems spasms  Prior Studies Any changes since last visit?  no  Physicians involved in your care Any changes since last visit?  no   Family History  Problem Relation Age of Onset  . Alzheimer's disease Mother   . Diabetes Father   . Heart disease Maternal Grandmother   . Breast cancer Paternal Grandmother 23   Social History   Socioeconomic History  . Marital status: Divorced    Spouse name: Not on file  . Number of children: 1  . Years of education: Not on file  . Highest education level: Not on file  Occupational History  . Occupation: disabled  Tobacco Use  . Smoking status: Never Smoker  . Smokeless tobacco: Never Used  Substance and Sexual Activity  . Alcohol use: No  . Drug use: No  . Sexual activity: Not Currently  Other Topics Concern  . Not on file  Social History Narrative  . Not on file   Social Determinants  of Health   Financial Resource Strain:   . Difficulty of Paying Living Expenses: Not on file  Food Insecurity:   . Worried About Charity fundraiser in the Last Year: Not on file  . Ran Out of Food in the Last Year: Not on file  Transportation Needs:   . Lack of Transportation (Medical): Not on file  . Lack of Transportation (Non-Medical): Not on file  Physical Activity:   . Days of Exercise per Week: Not on file  . Minutes of Exercise per Session: Not on file  Stress:   . Feeling of Stress : Not on file  Social Connections:   . Frequency of Communication with Friends and Family: Not on file  . Frequency of Social Gatherings with Friends and Family: Not on file  . Attends Religious Services: Not on file  . Active Member of Clubs or Organizations: Not on file  . Attends Archivist Meetings: Not on file  . Marital Status: Not on file   Past Surgical History:  Procedure Laterality Date  . ABDOMINAL HYSTERECTOMY     1986  . APPENDECTOMY     1983  . BREAST EXCISIONAL BIOPSY Right 1990's  . BREAST SURGERY     rt breast cyst done in the 90's  . CHOLECYSTECTOMY    . GALLBLADDER SURGERY     1983  .  ROTATOR CUFF REPAIR     right  . SPINE SURGERY    . TONSILLECTOMY     1973   Past Medical History:  Diagnosis Date  . Calcific tendonitis   . Cervical spondylosis without myelopathy   . Depression   . Diabetes mellitus   . Esophageal stricture   . Fibromyalgia   . GERD (gastroesophageal reflux disease)   . Hiatal hernia   . Hyperlipidemia   . Hypertension    There were no vitals taken for this visit.  Opioid Risk Score:   Fall Risk Score:  `1  Depression screen PHQ 2/9  Depression screen [facility] 2/9 06/16/2018 03/07/2018 02/08/2018 12/07/2017 11/09/2017 10/27/2017 07/29/2017  Decreased Interest 0 1 0 0 0 3 3  Down, Depressed, Hopeless 0 1 0 0 0 3 3  PHQ - 2 Score 0 2 0 0 0 6 6  Altered sleeping 0 - 0 - 0 - -  Tired, decreased energy 0 - 0 - 0 - -  Change in appetite  0 - 0 - 0 - -  Feeling bad or failure about yourself  0 - 0 - 0 - -  Trouble concentrating 0 - 0 - 0 - -  Moving slowly or fidgety/restless 0 - 0 - 0 - -  Suicidal thoughts 0 - 0 - 0 - -  PHQ-9 Score 0 - 0 - 0 - -  Difficult doing work/chores Not difficult at all - Not difficult at all - - - -  Some recent data might be hidden     Review of Systems  Constitutional: Negative.   HENT: Negative.   Eyes: Negative.   Respiratory: Negative.   Cardiovascular: Negative.   Gastrointestinal: Positive for abdominal pain and constipation.  Endocrine: Negative.   Genitourinary: Positive for difficulty urinating.  Musculoskeletal: Positive for arthralgias, back pain, myalgias, neck pain and neck stiffness.  Skin: Negative.   Allergic/Immunologic: Negative.   Neurological: Negative.   Hematological: Negative.   Psychiatric/Behavioral: Negative.   All other systems reviewed and are negative.      Objective:   Physical Exam Vitals and nursing note reviewed.  Constitutional:      Appearance: Normal appearance.  Cardiovascular:     Rate and Rhythm: Normal rate and regular rhythm.     Pulses: Normal pulses.     Heart sounds: Normal heart sounds.  Pulmonary:     Effort: Pulmonary effort is normal.     Breath sounds: Normal breath sounds.  Musculoskeletal:     Cervical back: Normal range of motion and neck supple.     Comments: Normal Muscle Bulk and Muscle Testing Reveals:  Upper Extremities: Full ROM and Muscle Strength 5/5 Right AC Joint Tenderness  Thoracic Paraspinal Tenderness: T-1-T-7  Lower Extremities: Full ROM and Muscle Strength 5/5 Arises from Table with ease Narrow Based Gait   Neurological:     Mental Status: She is alert and oriented to person, place, and time.  Psychiatric:        Mood and Affect: Mood normal.        Behavior: Behavior normal.           Assessment & Plan:  1. Fibromyalgia with myofascial pain: Continuehomeexercise programand heat therapy.  Continuecurrent medication regimen withFlexeril.09/05/2019. 2. Rotator cuff syndrome on the right: Continue with stretching exercises and heat therapy.09/05/2019 3. Cervicalgia. Post-laminectomy syndrome, facet arthropathy:Cervical Radiculopathy: Continue current medication regimen withGabapentin.09/05/2019 S/ P EMG with Dr. Naaman Plummer on 03/08/2017: Diagnosed with Moderate to Severe CTS of  Left wrist:09/05/2019. 4. Depression: Continuecurrent medication regimen withCymbalta. PCPFollowing. Currentdose 90 mg daily.09/05/2019 5. Mid/low back pain/ Lumbar Spondylosis: Continue Current Medication and stretching and heat therapy.09/05/2019 Refilled: Morphine Sulfate IR15 mg one tablet three times  a day as needed for pain # 80. 6. Lumbar Radiculopathy:Continuecurrent medication regimen withGabapentin.09/05/2019 7.OA of right hand: Continue Voltaren gel/ May substitute with Voltaren Tablet, she realizes she can't use both and verbalizes understanding. Continue withheat therapy.09/05/2019 8. Migraines:Continue withheadache Journal.Continue to Monitor 09/05/2019. 9. Left CTS:S/P EMG on 03/08/2017: S/P Carpal Tunnel Release on 06/14/2017 via . Dr. Apolonio Schneiders.09/05/2019 10.. Muscle Spasm: ContinueTizanidine. Continue to Monitor.09/05/2019 11. Right Shoulder Pain: Ortho Following: Received a Steroid Injection 2 weeks ago Dr. Cay Schillings.   33minutes of face to face patient care time was spent during this visit. All questions were encouraged and answered.   F/U in 67month

## 2019-09-06 DIAGNOSIS — H2511 Age-related nuclear cataract, right eye: Secondary | ICD-10-CM | POA: Diagnosis not present

## 2019-09-06 DIAGNOSIS — H25811 Combined forms of age-related cataract, right eye: Secondary | ICD-10-CM | POA: Diagnosis not present

## 2019-09-11 DIAGNOSIS — H25012 Cortical age-related cataract, left eye: Secondary | ICD-10-CM | POA: Diagnosis not present

## 2019-09-11 DIAGNOSIS — H25042 Posterior subcapsular polar age-related cataract, left eye: Secondary | ICD-10-CM | POA: Diagnosis not present

## 2019-09-11 DIAGNOSIS — H2512 Age-related nuclear cataract, left eye: Secondary | ICD-10-CM | POA: Diagnosis not present

## 2019-09-20 DIAGNOSIS — H25812 Combined forms of age-related cataract, left eye: Secondary | ICD-10-CM | POA: Diagnosis not present

## 2019-09-20 DIAGNOSIS — H25042 Posterior subcapsular polar age-related cataract, left eye: Secondary | ICD-10-CM | POA: Diagnosis not present

## 2019-09-20 DIAGNOSIS — H25012 Cortical age-related cataract, left eye: Secondary | ICD-10-CM | POA: Diagnosis not present

## 2019-09-20 DIAGNOSIS — H2512 Age-related nuclear cataract, left eye: Secondary | ICD-10-CM | POA: Diagnosis not present

## 2019-09-26 ENCOUNTER — Telehealth: Payer: Self-pay

## 2019-09-26 NOTE — Telephone Encounter (Signed)
PA completed via CoverMyMeds for Duloxetine Key: [id number] - PA Case ID: [id number] - Rx #: [id number]

## 2019-09-27 ENCOUNTER — Encounter: Payer: Medicare HMO | Attending: Physical Medicine and Rehabilitation | Admitting: Registered Nurse

## 2019-09-27 ENCOUNTER — Other Ambulatory Visit: Payer: Self-pay

## 2019-09-27 VITALS — BP 121/64 | HR 93 | Temp 97.9°F | Ht 61.0 in | Wt 143.6 lb

## 2019-09-27 DIAGNOSIS — M75101 Unspecified rotator cuff tear or rupture of right shoulder, not specified as traumatic: Secondary | ICD-10-CM | POA: Diagnosis not present

## 2019-09-27 DIAGNOSIS — M7918 Myalgia, other site: Secondary | ICD-10-CM | POA: Diagnosis not present

## 2019-09-27 DIAGNOSIS — Z79899 Other long term (current) drug therapy: Secondary | ICD-10-CM | POA: Diagnosis not present

## 2019-09-27 DIAGNOSIS — G894 Chronic pain syndrome: Secondary | ICD-10-CM

## 2019-09-27 DIAGNOSIS — Z5181 Encounter for therapeutic drug level monitoring: Secondary | ICD-10-CM

## 2019-09-27 DIAGNOSIS — M609 Myositis, unspecified: Secondary | ICD-10-CM | POA: Diagnosis not present

## 2019-09-27 DIAGNOSIS — M546 Pain in thoracic spine: Secondary | ICD-10-CM | POA: Diagnosis not present

## 2019-09-27 DIAGNOSIS — M75102 Unspecified rotator cuff tear or rupture of left shoulder, not specified as traumatic: Secondary | ICD-10-CM

## 2019-09-27 DIAGNOSIS — M25511 Pain in right shoulder: Secondary | ICD-10-CM | POA: Diagnosis not present

## 2019-09-27 DIAGNOSIS — M961 Postlaminectomy syndrome, not elsewhere classified: Secondary | ICD-10-CM | POA: Diagnosis not present

## 2019-09-27 DIAGNOSIS — M5010 Cervical disc disorder with radiculopathy, unspecified cervical region: Secondary | ICD-10-CM

## 2019-09-27 DIAGNOSIS — M797 Fibromyalgia: Secondary | ICD-10-CM

## 2019-09-27 DIAGNOSIS — E1142 Type 2 diabetes mellitus with diabetic polyneuropathy: Secondary | ICD-10-CM | POA: Diagnosis not present

## 2019-09-27 DIAGNOSIS — G8929 Other chronic pain: Secondary | ICD-10-CM

## 2019-09-27 MED ORDER — TIZANIDINE HCL 2 MG PO TABS: 1.0000 mg | ORAL_TABLET | Freq: Four times a day (QID) | ORAL | 2 refills | Status: DC | PRN

## 2019-09-27 MED ORDER — MORPHINE SULFATE 15 MG PO TABS: 15.0000 mg | ORAL_TABLET | Freq: Three times a day (TID) | ORAL | 0 refills | Status: DC | PRN

## 2019-09-27 NOTE — Progress Notes (Signed)
Subjective:    Patient ID: Robin Morgan, female    DOB: 1956/05/06, 64 y.o.   MRN: [id number]  HPI: NORIANA Morgan is a 64 y.o. female who returns for follow up appointment for chronic pain and medication refill. She states her pain is located in her neck radiating into her bilateral shoulders and upper -mid- back. She rates her pain 7. Her current exercise regime is walking and performing stretching exercises.  Ms. Praytor Morphine equivalent is 44.44 MME. Last UDS was Performed on 06/26/2019, it was consistent.  Pain Inventory Average Pain 8 Pain Right Now 7 My pain is constant, stabbing and aching  In the last 24 hours, has pain interfered with the following? General activity 9 Relation with others 9 Enjoyment of life 9 What TIME of day is your pain at its worst? all Sleep (in general) Fair  Pain is worse with: bending and standing Pain improves with: medication Relief from Meds: 5  Mobility ability to climb steps?  yes do you drive?  yes  Function disabled: date disabled .  Neuro/Psych bladder control problems weakness spasms depression  Prior Studies Any changes since last visit?  no  Physicians involved in your care Any changes since last visit?  no   Family History  Problem Relation Age of Onset  . Alzheimer's disease Mother   . Diabetes Father   . Heart disease Maternal Grandmother   . Breast cancer Paternal Grandmother 20   Social History   Socioeconomic History  . Marital status: Divorced    Spouse name: Not on file  . Number of children: 1  . Years of education: Not on file  . Highest education level: Not on file  Occupational History  . Occupation: disabled  Tobacco Use  . Smoking status: Never Smoker  . Smokeless tobacco: Never Used  Substance and Sexual Activity  . Alcohol use: No  . Drug use: No  . Sexual activity: Not Currently  Other Topics Concern  . Not on file  Social History Narrative  . Not on file   Social  Determinants of Health   Financial Resource Strain:   . Difficulty of Paying Living Expenses: Not on file  Food Insecurity:   . Worried About Charity fundraiser in the Last Year: Not on file  . Ran Out of Food in the Last Year: Not on file  Transportation Needs:   . Lack of Transportation (Medical): Not on file  . Lack of Transportation (Non-Medical): Not on file  Physical Activity:   . Days of Exercise per Week: Not on file  . Minutes of Exercise per Session: Not on file  Stress:   . Feeling of Stress : Not on file  Social Connections:   . Frequency of Communication with Friends and Family: Not on file  . Frequency of Social Gatherings with Friends and Family: Not on file  . Attends Religious Services: Not on file  . Active Member of Clubs or Organizations: Not on file  . Attends Archivist Meetings: Not on file  . Marital Status: Not on file   Past Surgical History:  Procedure Laterality Date  . ABDOMINAL HYSTERECTOMY     1986  . APPENDECTOMY     1983  . BREAST EXCISIONAL BIOPSY Right 1990's  . BREAST SURGERY     rt breast cyst done in the 90's  . CHOLECYSTECTOMY    . GALLBLADDER SURGERY     1983  . ROTATOR CUFF REPAIR  right  . SPINE SURGERY    . TONSILLECTOMY     1973   Past Medical History:  Diagnosis Date  . Calcific tendonitis   . Cervical spondylosis without myelopathy   . Depression   . Diabetes mellitus   . Esophageal stricture   . Fibromyalgia   . GERD (gastroesophageal reflux disease)   . Hiatal hernia   . Hyperlipidemia   . Hypertension    BP 121/64   Pulse 93   Temp 97.9 F (36.6 C)   Ht 5\' 1"  (1.549 m)   Wt 143 lb 9.6 oz (65.1 kg)   SpO2 95%   BMI 27.13 kg/m   Opioid Risk Score:   Fall Risk Score:  `1  Depression screen PHQ 2/9  Depression screen [facility] 2/9 06/16/2018 03/07/2018 02/08/2018 12/07/2017 11/09/2017 10/27/2017 07/29/2017  Decreased Interest 0 1 0 0 0 3 3  Down, Depressed, Hopeless 0 1 0 0 0 3 3  PHQ - 2 Score 0 2 0  0 0 6 6  Altered sleeping 0 - 0 - 0 - -  Tired, decreased energy 0 - 0 - 0 - -  Change in appetite 0 - 0 - 0 - -  Feeling bad or failure about yourself  0 - 0 - 0 - -  Trouble concentrating 0 - 0 - 0 - -  Moving slowly or fidgety/restless 0 - 0 - 0 - -  Suicidal thoughts 0 - 0 - 0 - -  PHQ-9 Score 0 - 0 - 0 - -  Difficult doing work/chores Not difficult at all - Not difficult at all - - - -  Some recent data might be hidden    Review of Systems  Neurological: Positive for weakness.  Psychiatric/Behavioral: Positive for dysphoric mood.       Objective:   Physical Exam Vitals and nursing note reviewed.  Constitutional:      Appearance: Normal appearance.  Cardiovascular:     Rate and Rhythm: Normal rate and regular rhythm.     Pulses: Normal pulses.     Heart sounds: Normal heart sounds. No murmur.  Pulmonary:     Effort: Pulmonary effort is normal.     Breath sounds: Normal breath sounds.  Musculoskeletal:     Cervical back: Normal range of motion and neck supple.     Comments: Normal Muscle Bulk and Muscle Testing Reveals:  Upper Extremities: Full ROM and Muscle Strength 5/5 Bilateral AC Joint Tenderness  Thoracic Paraspinal Tenderness: T-1-T-3 Mainly Right Side  Lower Extremities: Full ROM and Muscle Strength 5/5 Arises from Table with Ease Narrow Based Gait   Skin:    General: Skin is warm and dry.  Neurological:     Mental Status: She is alert and oriented to person, place, and time.  Psychiatric:        Mood and Affect: Mood normal.        Behavior: Behavior normal.           Assessment & Plan:  1. Fibromyalgia with myofascial pain: Continuehomeexercise programand heat therapy. Continuecurrent medication regimen withFlexeril.09/27/2019. 2. Rotator cuff syndrome on the right: Continue with stretching exercises and heat therapy.09/27/2019 3. Cervicalgia. Post-laminectomy syndrome, facet arthropathy:Cervical Radiculopathy: Continue current medication  regimen withGabapentin.09/27/2019 S/ P EMG with Dr. Naaman Plummer on 03/08/2017: Diagnosed with Moderate to Severe CTS of Left wrist:09/27/2019. 4. Depression: Continuecurrent medication regimen withCymbalta. PCPFollowing. Currentdose 90 mg daily.09/27/2019 5. Mid/low back pain/ Lumbar Spondylosis: Continue Current Medication and stretching and heat therapy.09/27/2019 Refilled:  Morphine Sulfate IR15 mg one tabletthreetimesa day as needed for pain # 80. 6. Lumbar Radiculopathy:Continuecurrent medication regimen withGabapentin.09/27/2019 7.OA of right hand: Continue Voltaren gel/ May substitute with Voltaren Tablet, she realizes she can't use both and verbalizes understanding. Continue withheat therapy.09/27/2019 8. Migraines:Continue withheadache Journal.Continue to Monitor 09/27/2019. 9. Left CTS:S/P EMG on 03/08/2017: S/P Carpal Tunnel Release on 06/14/2017 via . Dr. Ortman.09/27/2019 10.. Muscle Spasm: ContinueTizanidine. Continue to Monitor.09/27/2019 11. Right Shoulder Pain: Ortho Following: S/P Steroid Injection 2 weeks ago by Dr. Cay Schillings.   9minutes of face to face patient care time was spent during this visit. All questions were encouraged and answered.   F/U in 6month

## 2019-09-28 ENCOUNTER — Encounter: Payer: Self-pay | Admitting: Registered Nurse

## 2019-10-02 DIAGNOSIS — R05 Cough: Secondary | ICD-10-CM | POA: Diagnosis not present

## 2019-10-02 DIAGNOSIS — Z1152 Encounter for screening for COVID-19: Secondary | ICD-10-CM | POA: Diagnosis not present

## 2019-10-10 ENCOUNTER — Ambulatory Visit: Payer: Medicare HMO

## 2019-10-10 ENCOUNTER — Ambulatory Visit: Payer: Medicare HMO | Admitting: Family Medicine

## 2019-10-16 ENCOUNTER — Telehealth: Payer: Self-pay | Admitting: Registered Nurse

## 2019-10-16 NOTE — Telephone Encounter (Signed)
Robin Morgan reports she is having frequent and increase intensity of Migraines, that requires her to be in bed for weeks at a time. We discussed different treatment modalities, she will discussed with Dr Naaman Plummer, Her appointment was scheduled with Dr Naaman Plummer, she verbalizes understanding. Marland Kitchen

## 2019-10-25 ENCOUNTER — Other Ambulatory Visit: Payer: Self-pay

## 2019-10-25 ENCOUNTER — Encounter: Payer: Self-pay | Admitting: Physical Medicine & Rehabilitation

## 2019-10-25 ENCOUNTER — Telehealth: Payer: Self-pay | Admitting: Registered Nurse

## 2019-10-25 ENCOUNTER — Encounter: Payer: Medicare HMO | Attending: Physical Medicine and Rehabilitation | Admitting: Physical Medicine & Rehabilitation

## 2019-10-25 VITALS — BP 102/63 | HR 99 | Temp 97.5°F | Ht 61.0 in | Wt 147.0 lb

## 2019-10-25 DIAGNOSIS — M797 Fibromyalgia: Secondary | ICD-10-CM

## 2019-10-25 DIAGNOSIS — M961 Postlaminectomy syndrome, not elsewhere classified: Secondary | ICD-10-CM

## 2019-10-25 DIAGNOSIS — G894 Chronic pain syndrome: Secondary | ICD-10-CM

## 2019-10-25 DIAGNOSIS — M7918 Myalgia, other site: Secondary | ICD-10-CM | POA: Diagnosis not present

## 2019-10-25 DIAGNOSIS — Z5181 Encounter for therapeutic drug level monitoring: Secondary | ICD-10-CM

## 2019-10-25 DIAGNOSIS — M609 Myositis, unspecified: Secondary | ICD-10-CM | POA: Insufficient documentation

## 2019-10-25 DIAGNOSIS — M25511 Pain in right shoulder: Secondary | ICD-10-CM | POA: Insufficient documentation

## 2019-10-25 DIAGNOSIS — E1142 Type 2 diabetes mellitus with diabetic polyneuropathy: Secondary | ICD-10-CM | POA: Insufficient documentation

## 2019-10-25 DIAGNOSIS — Z79891 Long term (current) use of opiate analgesic: Secondary | ICD-10-CM

## 2019-10-25 MED ORDER — MORPHINE SULFATE 15 MG PO TABS: 15.0000 mg | ORAL_TABLET | Freq: Three times a day (TID) | ORAL | 0 refills | Status: DC | PRN

## 2019-10-25 NOTE — Progress Notes (Signed)
Subjective:    Patient ID: Robin Morgan, female    DOB: [birth year], 64 y.o.   MRN: [id number]  HPI  Robin Morgan is here in follow up of her chronic pain. She has had persistent headaches in her posterior skull and neck.  I last saw her over the summer.  We ultimately sent her for a C7-T1 translaminar epidural steroid injection.  She had no results at all and in fact said her pain was worse for about a week.  I reviewed her most recent MRI from March 03, 2019 and results are as follows:  C2-3: Mild uncovertebral spurring and moderate right and mild left facet arthrosis result in borderline right neural foraminal stenosis without spinal stenosis, unchanged.  C3-4: A small left central disc protrusion is slightly larger than on the prior study and results in mild spinal stenosis. Left uncovertebral spurring and moderate left facet arthrosis result in mild-to-moderate left neural foraminal stenosis, unchanged.  C4-5: Disc bulging, uncovertebral spurring, and mild facet arthrosis result in mild-to-moderate right and mild left neural foraminal stenosis without significant spinal stenosis, unchanged.  C5-6: Broad-based posterior disc osteophyte complex results in mild spinal stenosis with mild ventral cord flattening and severe bilateral neural foraminal stenosis with potential bilateral C6 nerve root impingement, unchanged.  C6-7: A broad-based posterior disc osteophyte complex with slightly more focal right paracentral disc protrusion results in borderline spinal stenosis without neural foraminal stenosis, unchanged.  C7-T1: Disc bulging and mild facet arthrosis without significant stenosis, unchanged.  T1-2: Mild disc bulging without stenosis, unchanged.  At this point with Covid and other situations around the house she is staying at home quite a bit.  When she has pain in her neck and shoulders she tends to lay down and get some relief.  Her morphine immediate release does  provide some relief.  Pain Inventory Average Pain 8 Pain Right Now 8 My pain is constant and aching  In the last 24 hours, has pain interfered with the following? General activity 9 Relation with others 9 Enjoyment of life 9 What TIME of day is your pain at its worst? daytime, evening, night Sleep (in general) Fair  Pain is worse with: bending and standing Pain improves with: medication Relief from Meds: 7  Mobility walk without assistance ability to climb steps?  yes do you drive?  yes  Function disabled: date disabled .  Neuro/Psych bladder control problems spasms  Prior Studies Any changes since last visit?  no  Physicians involved in your care Any changes since last visit?  no   Family History  Problem Relation Age of Onset  . Alzheimer's disease Mother   . Diabetes Father   . Heart disease Maternal Grandmother   . Breast cancer Paternal Grandmother 64   Social History   Socioeconomic History  . Marital status: Divorced    Spouse name: Not on file  . Number of children: 1  . Years of education: Not on file  . Highest education level: Not on file  Occupational History  . Occupation: disabled  Tobacco Use  . Smoking status: Never Smoker  . Smokeless tobacco: Never Used  Substance and Sexual Activity  . Alcohol use: No  . Drug use: No  . Sexual activity: Not Currently  Other Topics Concern  . Not on file  Social History Narrative  . Not on file   Social Determinants of Health   Financial Resource Strain:   . Difficulty of Paying Living Expenses:   Food Insecurity:   .  Worried About Charity fundraiser in the Last Year:   . Arboriculturist in the Last Year:   Transportation Needs:   . Film/video editor (Medical):   Marland Kitchen Lack of Transportation (Non-Medical):   Physical Activity:   . Days of Exercise per Week:   . Minutes of Exercise per Session:   Stress:   . Feeling of Stress :   Social Connections:   . Frequency of Communication  with Friends and Family:   . Frequency of Social Gatherings with Friends and Family:   . Attends Religious Services:   . Active Member of Clubs or Organizations:   . Attends Archivist Meetings:   Marland Kitchen Marital Status:    Past Surgical History:  Procedure Laterality Date  . ABDOMINAL HYSTERECTOMY     1986  . APPENDECTOMY     1983  . BREAST EXCISIONAL BIOPSY Right 1990's  . BREAST SURGERY     rt breast cyst done in the 90's  . CHOLECYSTECTOMY    . GALLBLADDER SURGERY     1983  . ROTATOR CUFF REPAIR     right  . SPINE SURGERY    . TONSILLECTOMY     1973   Past Medical History:  Diagnosis Date  . Calcific tendonitis   . Cervical spondylosis without myelopathy   . Depression   . Diabetes mellitus   . Esophageal stricture   . Fibromyalgia   . GERD (gastroesophageal reflux disease)   . Hiatal hernia   . Hyperlipidemia   . Hypertension    BP 102/63   Pulse 99   Temp (!) 97.5 F (36.4 C)   Ht 5\' 1"  (1.549 m)   Wt 147 lb (66.7 kg)   SpO2 94%   BMI 27.78 kg/m   Opioid Risk Score:   Fall Risk Score:  `1  Depression screen PHQ 2/9  Depression screen [facility] 2/9 09/27/2019 06/16/2018 03/07/2018 02/08/2018 12/07/2017 11/09/2017 10/27/2017  Decreased Interest 3 0 1 0 0 0 3  Down, Depressed, Hopeless 3 0 1 0 0 0 3  PHQ - 2 Score 6 0 2 0 0 0 6  Altered sleeping - 0 - 0 - 0 -  Tired, decreased energy - 0 - 0 - 0 -  Change in appetite - 0 - 0 - 0 -  Feeling bad or failure about yourself  - 0 - 0 - 0 -  Trouble concentrating - 0 - 0 - 0 -  Moving slowly or fidgety/restless - 0 - 0 - 0 -  Suicidal thoughts - 0 - 0 - 0 -  PHQ-9 Score - 0 - 0 - 0 -  Difficult doing work/chores - Not difficult at all - Not difficult at all - - -  Some recent data might be hidden    Review of Systems  Constitutional: Positive for unexpected weight change.  HENT: Negative.   Eyes: Negative.   Respiratory: Positive for apnea.   Cardiovascular: Negative.   Gastrointestinal: Positive for  nausea.  Endocrine:       High/low blood sugar   Genitourinary: Positive for urgency.  Musculoskeletal: Positive for arthralgias, back pain and neck pain.       Spasms  Skin: Negative.   Allergic/Immunologic: Negative.   Hematological: Negative.   Psychiatric/Behavioral: Negative.        Objective:   Physical Exam General: No acute distress HEENT: EOMI, oral membranes moist Cards: reg rate  Chest: normal effort Abdomen: Soft, NT, ND  Skin: dry, intact Extremities: no edema Neuro: Pt is cognitively appropriate with normal insight, memory, and awareness. Cranial nerves 2-12 are intact. Sensory exam is normal. Reflexes are 1 to 2+ in all 4's. Fine motor coordination is intact. No tremors. Motor function is grossly 5/5 but Spurling's and facet maneuvers really were not provocative today. Musculoskeletal: traps extremely tight, left more than right, head forward posture.  Rhomboids tight too.  Palpable trigger points in the left and right traps as well as right upper rhomboid.  Palpation seem to reproduce a lot of her pain. Psych: Pt's affect is appropriate. Pt is cooperative             Assessment & Plan:  1. Fibromyalgia: Continue exercise and heat therapy.  Maintain physical activity to tolerance.   2. Rotator cuff syndrome on the right>left:  continue rotator cuff exercises, and scapular ROM exercises as she's been doing.     3. Cervicalgia. Post-laminectomy syndrome, facet arthropathy:  Cervical Radiculopathy: Continue Gabapentin.                     -MS IR 15mg  q12 prn #60. RF today        We will continue the controlled substance monitoring program, this consists of regular clinic visits, examinations, routine drug screening, pill counts as well as use of [place] Controlled Substance Reporting System. [place] today.     -may be a candidate for MBB's of cervical spine  -C7-T1 ESI without benefit  -there's a myofascial component to this  -After informed  consent and preparation of the skin with isopropyl alcohol, I injected the right trap (2), left trap (1) and right upper rhomboid(1) each with 2cc of 1% lidocaine. The patient tolerated well, and no complications were experienced. Post-injection instructions were provided.  4. Depression: Continue Cymbalta. PCP increase dose to 90 mg daily. 5. Mid/low back pain/shoulder girdle pain: right neck and shoulder girdle pain appears largely myofascial.   -see above          -continue HEP as possible.         -continue tizanidine to replace flexeril for muscle spasm, start at 1mg  q6 prn  6. Lumbar Radiculopathy:   maintain HEP, posture 7 OA of right hand:    Voltaren gel and heat therapy.              -CONTINUE oral Voltaren 50 mg twice daily with food.   . 8. Migraines: stable, largely driven by shoulder girdle and neck pain   9. Left CTS:S/P EMG on 03/08/2017: S/P Carpal Tunnel Release on 06/14/2017 via . Dr. Apolonio Schneiders.  10. OIC: movantik           -continue diet/probiotic      Fifteen minutes of face to face patient care time were spent during this visit. All questions were encouraged and answered.  Follow up with me in a month .

## 2019-10-25 NOTE — Patient Instructions (Signed)
PLEASE FEEL FREE TO CALL OUR OFFICE WITH ANY PROBLEMS OR QUESTIONS ([phone])      

## 2019-10-25 NOTE — Telephone Encounter (Signed)
Robin Morgan asked for a letter to try to explain to her family why she is unable to assist in caring for their father.

## 2019-10-30 ENCOUNTER — Ambulatory Visit: Payer: Medicare HMO | Admitting: Registered Nurse

## 2019-10-31 LAB — TOXASSURE SELECT,+ANTIDEPR,UR

## 2019-11-01 ENCOUNTER — Telehealth: Payer: Self-pay

## 2019-11-01 NOTE — Telephone Encounter (Signed)
UDS RESULTS CONSISTENT WITH MEDICATIONS ON FILE  

## 2019-11-23 ENCOUNTER — Other Ambulatory Visit: Payer: Self-pay | Admitting: Family Medicine

## 2019-11-29 ENCOUNTER — Encounter: Payer: Self-pay | Admitting: Family Medicine

## 2019-12-01 ENCOUNTER — Other Ambulatory Visit: Payer: Self-pay | Admitting: Family Medicine

## 2019-12-01 ENCOUNTER — Ambulatory Visit (INDEPENDENT_AMBULATORY_CARE_PROVIDER_SITE_OTHER): Payer: Medicare HMO

## 2019-12-01 ENCOUNTER — Ambulatory Visit (INDEPENDENT_AMBULATORY_CARE_PROVIDER_SITE_OTHER): Payer: Medicare HMO | Admitting: Family Medicine

## 2019-12-01 ENCOUNTER — Other Ambulatory Visit: Payer: Self-pay

## 2019-12-01 ENCOUNTER — Encounter: Payer: Self-pay | Admitting: Family Medicine

## 2019-12-01 VITALS — BP 126/70 | HR 90 | Temp 98.0°F | Ht 61.0 in | Wt 147.6 lb

## 2019-12-01 DIAGNOSIS — F339 Major depressive disorder, recurrent, unspecified: Secondary | ICD-10-CM

## 2019-12-01 DIAGNOSIS — Z780 Asymptomatic menopausal state: Secondary | ICD-10-CM | POA: Diagnosis not present

## 2019-12-01 DIAGNOSIS — E1140 Type 2 diabetes mellitus with diabetic neuropathy, unspecified: Secondary | ICD-10-CM

## 2019-12-01 DIAGNOSIS — G4701 Insomnia due to medical condition: Secondary | ICD-10-CM | POA: Insufficient documentation

## 2019-12-01 DIAGNOSIS — M961 Postlaminectomy syndrome, not elsewhere classified: Secondary | ICD-10-CM | POA: Diagnosis not present

## 2019-12-01 DIAGNOSIS — M797 Fibromyalgia: Secondary | ICD-10-CM | POA: Diagnosis not present

## 2019-12-01 DIAGNOSIS — Z0000 Encounter for general adult medical examination without abnormal findings: Secondary | ICD-10-CM | POA: Diagnosis not present

## 2019-12-01 DIAGNOSIS — E039 Hypothyroidism, unspecified: Secondary | ICD-10-CM

## 2019-12-01 DIAGNOSIS — I10 Essential (primary) hypertension: Secondary | ICD-10-CM | POA: Diagnosis not present

## 2019-12-01 DIAGNOSIS — Z794 Long term (current) use of insulin: Secondary | ICD-10-CM

## 2019-12-01 DIAGNOSIS — Z23 Encounter for immunization: Secondary | ICD-10-CM

## 2019-12-01 DIAGNOSIS — R69 Illness, unspecified: Secondary | ICD-10-CM | POA: Diagnosis not present

## 2019-12-01 LAB — POCT GLYCOSYLATED HEMOGLOBIN (HGB A1C)
HbA1c POC (<> result, manual entry): 7.3 % (ref 4.0–5.6)
HbA1c, POC (controlled diabetic range): 7.3 % — AB (ref 0.0–7.0)
HbA1c, POC (prediabetic range): 7.3 % — AB (ref 5.7–6.4)
Hemoglobin A1C: 7.3 % — AB (ref 4.0–5.6)

## 2019-12-01 MED ORDER — LANTUS SOLOSTAR 100 UNIT/ML ~~LOC~~ SOPN: 35.0000 [IU] | PEN_INJECTOR | Freq: Two times a day (BID) | SUBCUTANEOUS | 5 refills | Status: DC

## 2019-12-01 NOTE — Progress Notes (Signed)
Subjective  CC:  Chief Complaint  Patient presents with  . Diabetes  . Depression  . Fibromyalgia    HPI: Robin Morgan is a 64 y.o. female who presents to the office today for follow up of diabetes and problems listed above in the chief complaint.   Diabetes follow up: Her diabetic control is reported as Unchanged. Feels it is fine. On lantus and metformin. She denies exertional CP or SOB or symptomatic hypoglycemia. She denies foot sores or paresthesias. Due prevnar  HTN: Feeling well. Taking medications w/o adverse effects. No symptoms of CHF, angina; no palpitations, sob, cp or lower extremity edema. Compliant with meds.   Depression: continues to be active, in part due to active pain, poor sleep related to pain and worry about family members/issues. High dose cymbalta w/o changes in sxs. No SI.   Fibro and chronic back pain per pain mgt. I've reviewed the notes   Insomnia, secondary: takes 1/2 [place] and prn flexeril w variable results and at times feels groggy in am.   Wt Readings from Last 3 Encounters:  12/01/19 147 lb 9.6 oz (67 kg)  12/01/19 147 lb 9.6 oz (67 kg)  10/25/19 147 lb (66.7 kg)    BP Readings from Last 3 Encounters:  12/01/19 126/70  12/01/19 126/70  10/25/19 102/63    Assessment  1. Controlled type 2 diabetes mellitus with diabetic neuropathy, with long-term current use of insulin ([place])   2. Essential hypertension   3. Cervical post-laminectomy syndrome   4. Fibromyalgia   5. Depression, recurrent ([place])   6. Insomnia due to medical condition      Plan   Diabetes is currently adequately controlled. However, would like a1c to be <7.0 ideally. Increase insulin does: split to 35 bid. May need 40 bid, can titrate up as needed. Updated prevnar today  HTN: well controlled  Pain: very active. Counseling done. Managed by pain mgt  Depression: on high dose cymbalta without much change in symptoms.   Insomnia due to pain and depression: try  flexeril nightly w/ 1/4 ambien.   Follow up: Return in about 3 months (around 03/01/2020) for follow up of diabetes and hypertension, mood follow up.. Orders Placed This Encounter  Procedures  . Pneumococcal conjugate vaccine 13-valent IM   Meds ordered this encounter  Medications  . LANTUS SOLOSTAR 100 UNIT/ML Solostar Pen    Sig: Inject 35 Units into the skin 2 (two) times daily.    Dispense:  15 mL    Refill:  5      Immunization History  Administered Date(s) Administered  . Fluad Quad(high Dose 65+) 04/06/2019  . Influenza Split 06/04/2009, 05/02/2012  . Influenza, Quadrivalent, Recombinant, Inj, Pf 05/12/2016, 07/07/2017  . Influenza, Seasonal, Injecte, Preservative Fre 06/06/2014, 04/24/2015  . Influenza,inj,Quad PF,6+ Mos 05/12/2016, 07/07/2017, 05/05/2018  . Influenza-Unspecified 09/03/2011, 04/19/2013  . PFIZER SARS-COV-2 Vaccination 10/03/2019, 10/24/2019  . Pneumococcal Conjugate-13 12/01/2019  . Pneumococcal Polysaccharide-23 08/29/2009, 10/19/2018  . Tdap 05/11/2012    Diabetes Related Lab Review: Lab Results  Component Value Date   HGBA1C 7.3 (A) 12/01/2019   HGBA1C 7.3 12/01/2019   HGBA1C 7.3 (A) 12/01/2019   HGBA1C 7.3 (A) 12/01/2019    Lab Results  Component Value Date   MICROALBUR <0.7 06/05/2019   Lab Results  Component Value Date   CREATININE 0.95 06/05/2019   BUN 21 06/05/2019   NA 140 06/05/2019   K 4.2 06/05/2019   CL 101 06/05/2019   CO2 28 06/05/2019  Lab Results  Component Value Date   CHOL 140 06/05/2019   CHOL 132 06/16/2018   Lab Results  Component Value Date   HDL 36.50 (L) 06/05/2019   HDL 39.90 06/16/2018   Lab Results  Component Value Date   LDLCALC 73 06/05/2019   LDLCALC 68 06/16/2018   Lab Results  Component Value Date   TRIG 151.0 (H) 06/05/2019   TRIG 120.0 06/16/2018   Lab Results  Component Value Date   CHOLHDL 4 06/05/2019   CHOLHDL 3 06/16/2018   No results found for: LDLDIRECT The 10-year ASCVD  risk score Mikey Bussing DC Jr., et al., 2013) is: 14.7%   Values used to calculate the score:     Age: 62 years     Sex: Female     Is Non-Hispanic African American: No     Diabetic: Yes     Tobacco smoker: No     Systolic Blood Pressure: 123XX123 mmHg     Is BP treated: Yes     HDL Cholesterol: 36.5 mg/dL     Total Cholesterol: 140 mg/dL I have reviewed the PMH, Fam and Soc history. Patient Active Problem List   Diagnosis Date Noted  . Diabetic peripheral neuropathy associated with type 2 diabetes mellitus ([place]) 01/06/2017    Priority: High  . OSA (obstructive sleep apnea) 03/10/2016    Priority: High  . Controlled diabetes mellitus with diabetic neuropathy, with long-term current use of insulin ([place]) 10/11/2014    Priority: High  . HTN (hypertension) 10/11/2014    Priority: High  . Hyperlipidemia 10/11/2014    Priority: High  . DDD (degenerative disc disease), lumbar 01/15/2014    Priority: High  . Hypothyroidism, acquired 06/12/2013    Priority: High  . Cervical post-laminectomy syndrome 05/10/2013    Priority: High  . Chronic pain associated with significant psychosocial dysfunction 01/03/2010    Priority: High    Overview:  Chronic Pain Syndrome, Dr. Earl Lites  Overview:  Overview:  Overview:  Chronic Pain Syndrome, Dr. Earl Lites   . Esophageal stricture     Priority: Medium  . Tenosynovitis, wrist 10/07/2017    Priority: Medium  . Trigger thumb of left hand 10/07/2017    Priority: Medium  . Laryngopharyngeal reflux (LPR) 08/26/2017    Priority: Medium  . Dysphagia 08/26/2017    Priority: Medium  . Left carpal tunnel syndrome 03/08/2017    Priority: Medium  . Fibromyalgia 02/12/2016    Priority: Medium  . Osteopenia 12/01/2012    Priority: Medium    10/2017: osteopenia: T = -1.9; stable. Recheck 2 years T = -1.2 at hip, -1.8 at spine, 4/ 2014   . Chronic diarrhea 07/04/2012    Priority: Medium    Overview:  ? Related to gallbladder surgery. Has been evaluated  by Dr. Amedeo Plenty GI. Lomotil as needed.   . Insomnia due to medical condition 12/01/2019  . Chronic prescription opiate use 09/02/2018  . Myofascial muscle pain 10/28/2011  . Depression, recurrent ([place]) 10/28/2011    Social History: Patient  reports that she has never smoked. She has never used smokeless tobacco. She reports that she does not drink alcohol or use drugs.  Review of Systems: Ophthalmic: negative for eye pain, loss of vision or double vision Cardiovascular: negative for chest pain Respiratory: negative for SOB or persistent cough Gastrointestinal: negative for abdominal pain Genitourinary: negative for dysuria or gross hematuria MSK: negative for foot lesions Neurologic: negative for weakness or gait disturbance  Objective  Vitals: BP 126/70  Pulse 90   Temp 98 F (36.7 C) (Temporal)   Ht 5\' 1"  (1.549 m)   Wt 147 lb 9.6 oz (67 kg)   SpO2 98%   BMI 27.89 kg/m  General: well appearing, no acute distress  Psych:  Alert and oriented, normal mood and affect Cardiovascular:  Nl S1 and S2, RRR without murmur, gallop or rub. no edema Respiratory:  Good breath sounds bilaterally, CTAB with normal effort, no rales  Diabetic education: ongoing education regarding chronic disease management for diabetes was given today. We continue to reinforce the ABC's of diabetic management: A1c (<7 or 8 dependent upon patient), tight blood pressure control, and cholesterol management with goal LDL < 100 minimally. We discuss diet strategies, exercise recommendations, medication options and possible side effects. At each visit, we review recommended immunizations and preventive care recommendations for diabetics and stress that good diabetic control can prevent other problems. See below for this patient's data.    Commons side effects, risks, benefits, and alternatives for medications and treatment plan prescribed today were discussed, and the patient expressed understanding of the given  instructions. Patient is instructed to call or message via MyChart if he/she has any questions or concerns regarding our treatment plan. No barriers to understanding were identified. We discussed Red Flag symptoms and signs in detail. Patient expressed understanding regarding what to do in case of urgent or emergency type symptoms.   Medication list was reconciled, printed and provided to the patient in AVS. Patient instructions and summary information was reviewed with the patient as documented in the AVS. This note was prepared with assistance of Dragon voice recognition software. Occasional wrong-word or sound-a-like substitutions may have occurred due to the inherent limitations of voice recognition software  This visit occurred during the SARS-CoV-2 public health emergency.  Safety protocols were in place, including screening questions prior to the visit, additional usage of staff PPE, and extensive cleaning of exam room while observing appropriate contact time as indicated for disinfecting solutions.

## 2019-12-01 NOTE — Patient Instructions (Addendum)
Robin Morgan , Thank you for taking time to come for your Medicare Wellness Visit. I appreciate your ongoing commitment to your health goals. Please review the following plan we discussed and let me know if I can assist you in the future.   Screening recommendations/referrals: Colorectal Screening: up to date; last 11/20/15 Mammogram: up to date; last 04/14/19 Bone Density: recommended; last 11/03/17   Vision and Dental Exams: Recommended annual ophthalmology exams for early detection of glaucoma and other disorders of the eye Recommended annual dental exams for proper oral hygiene  Diabetic Exams: Diabetic Eye Exam: recommended yearly; up to date  Diabetic Foot Exam: recommended yearly; up to date   Vaccinations: Influenza vaccine: completed 04/06/19 Pneumococcal vaccine: Prevnar recommended;  Pneumovax received 10/19/18 Tdap vaccine: up to date; last 05/11/12  Shingles vaccine: You may receive this vaccine at your local pharmacy. (see handout)  Covid vaccine: Completed   Advanced directives: Please bring a copy of your POA (Power of Attorney) and/or Living Will to your next appointment.  Goals:Recommend to drink at least 6-8 8oz glasses of water per day and consume a balanced diet rich in fresh fruits and vegetables.   Next appointment: Please schedule your Annual Wellness Visit with your Nurse Health Advisor in one year.  Preventive Care 1 Years and Older, Female Preventive care refers to lifestyle choices and visits with your health care provider that can promote health and wellness. What does preventive care include?  A yearly physical exam. This is also called an annual well check.  Dental exams once or twice a year.  Routine eye exams. Ask your health care provider how often you should have your eyes checked.  Personal lifestyle choices, including:  Daily care of your teeth and gums.  Regular physical activity.  Eating a healthy diet.  Avoiding tobacco and drug use.   Limiting alcohol use.  Practicing safe sex.  Taking low-dose aspirin every day if recommended by your health care provider.  Taking vitamin and mineral supplements as recommended by your health care provider. What happens during an annual well check? The services and screenings done by your health care provider during your annual well check will depend on your age, overall health, lifestyle risk factors, and family history of disease. Counseling  Your health care provider may ask you questions about your:  Alcohol use.  Tobacco use.  Drug use.  Emotional well-being.  Home and relationship well-being.  Sexual activity.  Eating habits.  History of falls.  Memory and ability to understand (cognition).  Work and work Statistician.  Reproductive health. Screening  You may have the following tests or measurements:  Height, weight, and BMI.  Blood pressure.  Lipid and cholesterol levels. These may be checked every 5 years, or more frequently if you are over 10 years old.  Skin check.  Lung cancer screening. You may have this screening every year starting at age 11 if you have a 30-pack-year history of smoking and currently smoke or have quit within the past 15 years.  Fecal occult blood test (FOBT) of the stool. You may have this test every year starting at age 47.  Flexible sigmoidoscopy or colonoscopy. You may have a sigmoidoscopy every 5 years or a colonoscopy every 10 years starting at age 24.  Hepatitis C blood test.  Hepatitis B blood test.  Sexually transmitted disease (STD) testing.  Diabetes screening. This is done by checking your blood sugar (glucose) after you have not eaten for a while (fasting). You may  have this done every 1-3 years.  Bone density scan. This is done to screen for osteoporosis. You may have this done starting at age 7.  Mammogram. This may be done every 1-2 years. Talk to your health care provider about how often you should have  regular mammograms. Talk with your health care provider about your test results, treatment options, and if necessary, the need for more tests. Vaccines  Your health care provider may recommend certain vaccines, such as:  Influenza vaccine. This is recommended every year.  Tetanus, diphtheria, and acellular pertussis (Tdap, Td) vaccine. You may need a Td booster every 10 years.  Zoster vaccine. You may need this after age 19.  Pneumococcal 13-valent conjugate (PCV13) vaccine. One dose is recommended after age 33.  Pneumococcal polysaccharide (PPSV23) vaccine. One dose is recommended after age 26. Talk to your health care provider about which screenings and vaccines you need and how often you need them. This information is not intended to replace advice given to you by your health care provider. Make sure you discuss any questions you have with your health care provider. Document Released: 08/23/2015 Document Revised: 04/15/2016 Document Reviewed: 05/28/2015 Elsevier Interactive Patient Education  2017 [employer] Prevention in the Home Falls can cause injuries. They can happen to people of all ages. There are many things you can do to make your home safe and to help prevent falls. What can I do on the outside of my home?  Regularly fix the edges of walkways and driveways and fix any cracks.  Remove anything that might make you trip as you walk through a door, such as a raised step or threshold.  Trim any bushes or trees on the path to your home.  Use bright outdoor lighting.  Clear any walking paths of anything that might make someone trip, such as rocks or tools.  Regularly check to see if handrails are loose or broken. Make sure that both sides of any steps have handrails.  Any raised decks and porches should have guardrails on the edges.  Have any leaves, snow, or ice cleared regularly.  Use sand or salt on walking paths during winter.  Clean up any spills in your  garage right away. This includes oil or grease spills. What can I do in the bathroom?  Use night lights.  Install grab bars by the toilet and in the tub and shower. Do not use towel bars as grab bars.  Use non-skid mats or decals in the tub or shower.  If you need to sit down in the shower, use a plastic, non-slip stool.  Keep the floor dry. Clean up any water that spills on the floor as soon as it happens.  Remove soap buildup in the tub or shower regularly.  Attach bath mats securely with double-sided non-slip rug tape.  Do not have throw rugs and other things on the floor that can make you trip. What can I do in the bedroom?  Use night lights.  Make sure that you have a light by your bed that is easy to reach.  Do not use any sheets or blankets that are too big for your bed. They should not hang down onto the floor.  Have a firm chair that has side arms. You can use this for support while you get dressed.  Do not have throw rugs and other things on the floor that can make you trip. What can I do in the kitchen?  Clean up  any spills right away.  Avoid walking on wet floors.  Keep items that you use a lot in easy-to-reach places.  If you need to reach something above you, use a strong step stool that has a grab bar.  Keep electrical cords out of the way.  Do not use floor polish or wax that makes floors slippery. If you must use wax, use non-skid floor wax.  Do not have throw rugs and other things on the floor that can make you trip. What can I do with my stairs?  Do not leave any items on the stairs.  Make sure that there are handrails on both sides of the stairs and use them. Fix handrails that are broken or loose. Make sure that handrails are as long as the stairways.  Check any carpeting to make sure that it is firmly attached to the stairs. Fix any carpet that is loose or worn.  Avoid having throw rugs at the top or bottom of the stairs. If you do have throw  rugs, attach them to the floor with carpet tape.  Make sure that you have a light switch at the top of the stairs and the bottom of the stairs. If you do not have them, ask someone to add them for you. What else can I do to help prevent falls?  Wear shoes that:  Do not have high heels.  Have rubber bottoms.  Are comfortable and fit you well.  Are closed at the toe. Do not wear sandals.  If you use a stepladder:  Make sure that it is fully opened. Do not climb a closed stepladder.  Make sure that both sides of the stepladder are locked into place.  Ask someone to hold it for you, if possible.  Clearly mark and make sure that you can see:  Any grab bars or handrails.  First and last steps.  Where the edge of each step is.  Use tools that help you move around (mobility aids) if they are needed. These include:  Canes.  Walkers.  Scooters.  Crutches.  Turn on the lights when you go into a dark area. Replace any light bulbs as soon as they burn out.  Set up your furniture so you have a clear path. Avoid moving your furniture around.  If any of your floors are uneven, fix them.  If there are any pets around you, be aware of where they are.  Review your medicines with your doctor. Some medicines can make you feel dizzy. This can increase your chance of falling. Ask your doctor what other things that you can do to help prevent falls. This information is not intended to replace advice given to you by your health care provider. Make sure you discuss any questions you have with your health care provider. Document Released: 05/23/2009 Document Revised: 01/02/2016 Document Reviewed: 08/31/2014 Elsevier Interactive Patient Education  2017 [employer].

## 2019-12-01 NOTE — Progress Notes (Signed)
Subjective:   Robin Morgan is a 64 y.o. female who presents for an Initial Medicare Annual Wellness Visit.  Review of Systems     Cardiac Risk Factors include: advanced age (>30men, >50 women);diabetes mellitus;dyslipidemia;hypertension;sedentary lifestyle    Objective:    Today's Vitals   12/01/19 1049  BP: 126/70  Pulse: 90  Temp: 98 F (36.7 C)  TempSrc: Temporal  SpO2: 98%  Weight: 147 lb 9.6 oz (67 kg)  Height: 5\' 1"  (1.549 m)  PainSc: 8    Body mass index is 27.89 kg/m.  Advanced Directives 06/03/2017 04/29/2017 04/08/2017 04/08/2017 01/06/2017 11/04/2016 09/22/2016  Does Patient Have a Medical Advance Directive? No No No No No No No  Would patient like information on creating a medical advance directive? No - Patient declined - - No - Patient declined - - -    Current Medications (verified) Outpatient Encounter Medications as of 12/01/2019  Medication Sig  . atorvastatin (LIPITOR) 40 MG tablet TAKE 1 TABLET BY MOUTH ONCE DAILY  . b complex vitamins tablet Take 1 tablet by mouth daily.  . Calcium Carbonate-Vitamin D (CALCIUM-VITAMIN D) 500-200 MG-UNIT per tablet Take 1 tablet by mouth 2 (two) times daily with a meal.  . co-enzyme Q-10 30 MG capsule Take 30 mg by mouth 3 (three) times daily.  . diclofenac sodium (VOLTAREN) 1 % GEL Apply 2 g topically 4 (four) times daily.  . DULoxetine (CYMBALTA) 30 MG capsule Take 2 capsules (60 mg total) by mouth 2 (two) times daily.  Marland Kitchen esomeprazole (NEXIUM) 40 MG capsule TAKE 1 CAPSULE BY MOUTH 2 TIMES DAILY BEFORE A MEAL. MORNING AND EVENING  . fenofibrate 160 MG tablet TAKE 1 TABLET BY MOUTH ONCE A DAY  . gabapentin (NEURONTIN) 300 MG capsule TAKE 2 CAPSULES IN THE MORNING, 2 CAPSULES AT LUNCH AND 2 capsules  AT BEDTIME  . gentamicin ointment (GARAMYCIN) 0.1 % Apply 1 application topically 3 (three) times daily.  Marland Kitchen LANTUS SOLOSTAR 100 UNIT/ML Solostar Pen INJECT 64 UNITS INTO THE SKIN ONCE DAILYAS DIRECTED  . levothyroxine  (SYNTHROID) 88 MCG tablet TAKE 1 TABLET BY MOUTH ONCE A DAY. TAKE ON AN EMPTY STOMACH WITH A GLASS OF WATER ATLEAST 30-60 MIN BEFORE BREAKFAST  . lisinopril (ZESTRIL) 10 MG tablet TAKE 1 TABLET BY MOUTH ONCE DAILY  . magnesium gluconate (MAGONATE) 500 MG tablet Take 1 tablet (500 mg total) by mouth daily.  . metFORMIN (GLUCOPHAGE) 1000 MG tablet TAKE 1 TABLET BY MOUTH TWICE A DAY WITH MEALS  . morphine (MSIR) 15 MG tablet Take 1 tablet (15 mg total) by mouth 3 (three) times daily as needed for moderate pain.  . naloxegol oxalate (MOVANTIK) 25 MG TABS tablet Take 1 tablet (25 mg total) by mouth daily.  . Omega-3 Fatty Acids (FISH OIL) 1000 MG CAPS Take 2 capsules by mouth daily.  Marland Kitchen tiZANidine (ZANAFLEX) 2 MG tablet Take 0.5-1 tablets (1-2 mg total) by mouth every 6 (six) hours as needed for muscle spasms.  Marland Kitchen triamcinolone cream (KENALOG) 0.1 % Apply 1 application topically 2 (two) times daily. As needed  . zolpidem (AMBIEN) 10 MG tablet TAKE 1 TABLET BY MOUTH AT BEDTIME AS NEEDED FOR SLEEP   No facility-administered encounter medications on file as of 12/01/2019.    Allergies (verified) Compazine, Prochlorperazine, and Naproxen   History: Past Medical History:  Diagnosis Date  . Calcific tendonitis   . Cervical spondylosis without myelopathy   . Depression   . Diabetes mellitus   .  Esophageal stricture   . Fibromyalgia   . GERD (gastroesophageal reflux disease)   . Hiatal hernia   . Hyperlipidemia   . Hypertension    Past Surgical History:  Procedure Laterality Date  . ABDOMINAL HYSTERECTOMY     1986  . APPENDECTOMY     1983  . BREAST EXCISIONAL BIOPSY Right 1990's  . BREAST SURGERY     rt breast cyst done in the 90's  . CHOLECYSTECTOMY    . GALLBLADDER SURGERY     1983  . ROTATOR CUFF REPAIR     right  . SPINE SURGERY    . TONSILLECTOMY     1973   Family History  Problem Relation Age of Onset  . Alzheimer's disease Mother   . Diabetes Father   . Heart disease  Maternal Grandmother   . Breast cancer Paternal Grandmother 36   Social History   Socioeconomic History  . Marital status: Divorced    Spouse name: Not on file  . Number of children: 1  . Years of education: Not on file  . Highest education level: Not on file  Occupational History  . Occupation: disabled  Tobacco Use  . Smoking status: Never Smoker  . Smokeless tobacco: Never Used  Substance and Sexual Activity  . Alcohol use: No  . Drug use: No  . Sexual activity: Not Currently  Other Topics Concern  . Not on file  Social History Narrative  . Not on file   Social Determinants of Health   Financial Resource Strain:   . Difficulty of Paying Living Expenses:   Food Insecurity:   . Worried About Charity fundraiser in the Last Year:   . Arboriculturist in the Last Year:   Transportation Needs:   . Film/video editor (Medical):   Marland Kitchen Lack of Transportation (Non-Medical):   Physical Activity:   . Days of Exercise per Week:   . Minutes of Exercise per Session:   Stress:   . Feeling of Stress :   Social Connections:   . Frequency of Communication with Friends and Family:   . Frequency of Social Gatherings with Friends and Family:   . Attends Religious Services:   . Active Member of Clubs or Organizations:   . Attends Archivist Meetings:   Marland Kitchen Marital Status:     Tobacco Counseling Counseling given: Not Answered   Clinical Intake:  Pre-visit preparation completed: Yes  Pain : 0-10 Pain Score: 8  Pain Type: Chronic pain Pain Location: Back Pain Orientation: Lower Pain Descriptors / Indicators: Aching, Constant Pain Onset: More than a month ago Pain Frequency: Constant    Diabetes: Yes CBG done?: Yes CBG resulted in Enter/ Edit results?: Yes Did pt. bring in CBG monitor from home?: No  How often do you need to have someone help you when you read instructions, pamphlets, or other written materials from your doctor or pharmacy?: 1 -  Never  Interpreter Needed?: No  Information entered by :: Denman George LPN   Activities of Daily Living In your present state of health, do you have any difficulty performing the following activities: 12/01/2019  Hearing? N  Vision? N  Difficulty concentrating or making decisions? N  Walking or climbing stairs? Y  Dressing or bathing? N  Doing errands, shopping? N  Preparing Food and eating ? N  Using the Toilet? N  In the past six months, have you accidently leaked urine? N  Do you have problems with  loss of bowel control? N  Managing your Medications? N  Managing your Finances? N  Housekeeping or managing your Housekeeping? N  Some recent data might be hidden     Immunizations and Health Maintenance Immunization History  Administered Date(s) Administered  . Fluad Quad(high Dose 65+) 04/06/2019  . Influenza Split 06/04/2009, 05/02/2012  . Influenza, Quadrivalent, Recombinant, Inj, Pf 05/12/2016, 07/07/2017  . Influenza, Seasonal, Injecte, Preservative Fre 06/06/2014, 04/24/2015  . Influenza,inj,Quad PF,6+ Mos 05/12/2016, 07/07/2017, 05/05/2018  . Influenza-Unspecified 09/03/2011, 04/19/2013  . PFIZER SARS-COV-2 Vaccination 10/03/2019, 10/24/2019  . Pneumococcal Polysaccharide-23 08/29/2009, 10/19/2018  . Tdap 05/11/2012   Health Maintenance Due  Topic Date Due  . PNA vac Low Risk Adult (2 of 2 - PCV13) 10/19/2019  . DEXA SCAN  11/04/2019    Patient Care Team: Leamon Arnt, MD as PCP - General (Family Medicine) Iran Planas, MD as Consulting Physician (Orthopedic Surgery) Meredith Staggers, MD as Consulting Physician (Physical Medicine and Rehabilitation) Jolene Provost, PA-C as Physician Assistant (Otolaryngology) Dohmeier, Asencion Partridge, MD as Consulting Physician (Neurology) Arta Silence, MD as Consulting Physician (Gastroenterology) Hortencia Pilar, MD as Consulting Physician (Ophthalmology)  Indicate any recent Medical Services you may have  received from other than [facility] providers in the past year (date may be approximate).     Assessment:   This is a routine wellness examination for Aniiyah.  Hearing/Vision screen No exam data present  Dietary issues and exercise activities discussed: Current Exercise Habits: The patient does not participate in regular exercise at present, Exercise limited by: orthopedic condition(s)  Goals   None    Depression Screen PHQ 2/9 Scores 12/01/2019 09/27/2019 06/16/2018 03/07/2018 02/08/2018 12/07/2017 11/09/2017  PHQ - 2 Score 3 6 0 2 0 0 0  PHQ- 9 Score 16 - 0 - 0 - 0    Fall Risk Fall Risk  12/01/2019 10/25/2019 09/05/2019 08/08/2019 06/26/2019  Falls in the past year? 1 0 0 0 0  Number falls in past yr: 1 - - - -  Injury with Fall? 0 - - - -  Risk for fall due to : History of fall(s);Impaired balance/gait - - - -  Follow up Education provided;Falls prevention discussed;Falls evaluation completed - - - -    Is the patient's home free of loose throw rugs in walkways, pet beds, electrical cords, etc?   yes      Grab bars in the bathroom? yes      Handrails on the stairs?   yes      Adequate lighting?   yes  Timed Get Up and Go Performed completed and within normal timeframe; no gait abnormalities noted   Cognitive Function: MMSE - Mini Mental State Exam 05/26/2018  Orientation to time 5  Orientation to Place 5  Registration 3  Attention/ Calculation 5  Recall 2  Language- name 2 objects 2  Language- repeat 1  Language- follow 3 step command 3  Language- read & follow direction 1  Write a sentence 1  Copy design 1  Total score 29     6CIT Screen 12/01/2019  What Year? 0 points  What month? 0 points  What time? 0 points  Count back from 20 0 points  Months in reverse 0 points  Repeat phrase 0 points  Total Score 0    Screening Tests Health Maintenance  Topic Date Due  . PNA vac Low Risk Adult (2 of 2 - PCV13) 10/19/2019  . DEXA SCAN  11/04/2019  . INFLUENZA VACCINE  03/10/2020  . FOOT EXAM  04/05/2020  . MAMMOGRAM  04/13/2020  . HEMOGLOBIN A1C  06/01/2020  . OPHTHALMOLOGY EXAM  08/06/2020  . TETANUS/TDAP  05/11/2022  . COLONOSCOPY  11/19/2025  . COVID-19 Vaccine  Completed  . Hepatitis C Screening  Completed    Qualifies for Shingles Vaccine? Discussed and patient will check with pharmacy for coverage.  Patient education handout provided   Cancer Screenings: Lung: Low Dose CT Chest recommended if Age 31-80 years, 30 pack-year currently smoking OR have quit w/in 15years. Patient does not qualify. Breast: Up to date on Mammogram? Yes   Up to date of Bone Density/Dexa? No; ordered today  Colorectal: colonoscopy 11/20/15     Plan:  I have personally reviewed and addressed the Medicare Annual Wellness questionnaire and have noted the following in the patient's chart:  A. Medical and social history B. Use of alcohol, tobacco or illicit drugs  C. Current medications and supplements D. Functional ability and status E.  Nutritional status F.  Physical activity G. Advance directives H. List of other physicians I.  Hospitalizations, surgeries, and ER visits in previous 12 months J.  [place] such as hearing and vision if needed, cognitive and depression L. Referrals, records requested, and appointments- none   In addition, I have reviewed and discussed with patient certain preventive protocols, quality metrics, and best practice recommendations. A written personalized care plan for preventive services as well as general preventive health recommendations were provided to patient.   Signed,  Denman George, LPN  Nurse Health Advisor   Nurse Notes: no additional

## 2019-12-01 NOTE — Patient Instructions (Signed)
Please return in 3 months for diabetes follow up  Please increase your lantus to 35 units in the morning and 35 units before bedtime.   Today you were given your Prevnar vaccination. This is your last needed immunization for pneumonia protection.   If you have any questions or concerns, please don't hesitate to send me a message via MyChart or call the office at [phone]. Thank you for visiting with [place] today! It's our pleasure caring for you.

## 2019-12-06 ENCOUNTER — Encounter: Payer: Medicare HMO | Attending: Physical Medicine and Rehabilitation | Admitting: Physical Medicine & Rehabilitation

## 2019-12-06 ENCOUNTER — Encounter: Payer: Self-pay | Admitting: Physical Medicine & Rehabilitation

## 2019-12-06 ENCOUNTER — Other Ambulatory Visit: Payer: Self-pay

## 2019-12-06 VITALS — BP 113/57 | HR 100 | Temp 96.6°F | Ht 61.0 in | Wt 146.8 lb

## 2019-12-06 DIAGNOSIS — Z79891 Long term (current) use of opiate analgesic: Secondary | ICD-10-CM | POA: Insufficient documentation

## 2019-12-06 DIAGNOSIS — M7918 Myalgia, other site: Secondary | ICD-10-CM | POA: Insufficient documentation

## 2019-12-06 DIAGNOSIS — F339 Major depressive disorder, recurrent, unspecified: Secondary | ICD-10-CM | POA: Insufficient documentation

## 2019-12-06 DIAGNOSIS — M25511 Pain in right shoulder: Secondary | ICD-10-CM | POA: Insufficient documentation

## 2019-12-06 DIAGNOSIS — M961 Postlaminectomy syndrome, not elsewhere classified: Secondary | ICD-10-CM | POA: Diagnosis not present

## 2019-12-06 DIAGNOSIS — R69 Illness, unspecified: Secondary | ICD-10-CM | POA: Diagnosis not present

## 2019-12-06 DIAGNOSIS — G894 Chronic pain syndrome: Secondary | ICD-10-CM

## 2019-12-06 DIAGNOSIS — M549 Dorsalgia, unspecified: Secondary | ICD-10-CM | POA: Diagnosis not present

## 2019-12-06 DIAGNOSIS — Z5181 Encounter for therapeutic drug level monitoring: Secondary | ICD-10-CM | POA: Insufficient documentation

## 2019-12-06 DIAGNOSIS — M797 Fibromyalgia: Secondary | ICD-10-CM

## 2019-12-06 DIAGNOSIS — E1142 Type 2 diabetes mellitus with diabetic polyneuropathy: Secondary | ICD-10-CM | POA: Insufficient documentation

## 2019-12-06 DIAGNOSIS — M609 Myositis, unspecified: Secondary | ICD-10-CM | POA: Insufficient documentation

## 2019-12-06 MED ORDER — MORPHINE SULFATE 15 MG PO TABS: 15.0000 mg | ORAL_TABLET | Freq: Three times a day (TID) | ORAL | 0 refills | Status: DC | PRN

## 2019-12-06 NOTE — Patient Instructions (Signed)
I WANT YOU TO GET OUTSIDE, EVERY DAY, WORKING ON BASIC EXERCISE, GETTING SOM SUNLIGHT, ETC   WORK ON LEISURE ACTIVITIES TO HELP GET YOUR MIND OFF PAIN.

## 2019-12-06 NOTE — Progress Notes (Signed)
Subjective:    Patient ID: Robin Morgan, female    DOB: March 21, 1956, 64 y.o.   MRN: [id number]  HPI   Robin Morgan is here in follow-up of her chronic pain.  At last visit we injected some rhomboids and trapezius trigger points which provided some relief.  She still is having headaches which can be debilitating.  Her biggest complaint really at this point is mid back pain which bothers her when she is standing or sitting for longer periods of time in particular.  She is has been in bed quite a bit here lately due to the pain when she stands.  She is using her morphine for pain relief with some benefit.  Her family practice doctor increased her Cymbalta to 60 mg twice a day.  She is only taking 90 mg a day currently.  She is not seeing a counselor of any sort.  She admits to increased depression and anxiety surrounding some family issues as well as her pain itself.  Pain Inventory Average Pain 9 Pain Right Now 8 My pain is constant and deep pain  In the last 24 hours, has pain interfered with the following? General activity 10 Relation with others 10 Enjoyment of life 10 What TIME of day is your pain at its worst? all Sleep (in general) Poor  Pain is worse with: standing Pain improves with: rest Relief from Meds: 7  Mobility ability to climb steps?  yes do you drive?  yes  Function disabled: date disabled .  Neuro/Psych bladder control problems spasms depression  Prior Studies Any changes since last visit?  no  Physicians involved in your care Any changes since last visit?  no   Family History  Problem Relation Age of Onset  . Alzheimer's disease Mother   . Diabetes Father   . Heart disease Maternal Grandmother   . Breast cancer Paternal Grandmother 50   Social History   Socioeconomic History  . Marital status: Divorced    Spouse name: Not on file  . Number of children: 1  . Years of education: Not on file  . Highest education level: Not on file    Occupational History  . Occupation: disabled  Tobacco Use  . Smoking status: Never Smoker  . Smokeless tobacco: Never Used  Substance and Sexual Activity  . Alcohol use: No  . Drug use: No  . Sexual activity: Not Currently  Other Topics Concern  . Not on file  Social History Narrative  . Not on file   Social Determinants of Health   Financial Resource Strain:   . Difficulty of Paying Living Expenses:   Food Insecurity:   . Worried About Charity fundraiser in the Last Year:   . Arboriculturist in the Last Year:   Transportation Needs:   . Film/video editor (Medical):   Marland Kitchen Lack of Transportation (Non-Medical):   Physical Activity:   . Days of Exercise per Week:   . Minutes of Exercise per Session:   Stress:   . Feeling of Stress :   Social Connections:   . Frequency of Communication with Friends and Family:   . Frequency of Social Gatherings with Friends and Family:   . Attends Religious Services:   . Active Member of Clubs or Organizations:   . Attends Archivist Meetings:   Marland Kitchen Marital Status:    Past Surgical History:  Procedure Laterality Date  . ABDOMINAL HYSTERECTOMY     1986  .  APPENDECTOMY     1983  . BREAST EXCISIONAL BIOPSY Right 1990's  . BREAST SURGERY     rt breast cyst done in the 90's  . CHOLECYSTECTOMY    . GALLBLADDER SURGERY     1983  . ROTATOR CUFF REPAIR     right  . SPINE SURGERY    . TONSILLECTOMY     1973   Past Medical History:  Diagnosis Date  . Calcific tendonitis   . Cervical spondylosis without myelopathy   . Depression   . Diabetes mellitus   . Esophageal stricture   . Fibromyalgia   . GERD (gastroesophageal reflux disease)   . Hiatal hernia   . Hyperlipidemia   . Hypertension    BP (!) 113/57   Pulse 100   Temp (!) 96.6 F (35.9 C)   Ht 5\' 1"  (1.549 m)   Wt 146 lb 12.8 oz (66.6 kg)   SpO2 94%   BMI 27.74 kg/m   Opioid Risk Score:   Fall Risk Score:  `1  Depression screen PHQ 2/9  Depression  screen [facility] 2/9 12/06/2019 12/01/2019 09/27/2019 06/16/2018 03/07/2018 02/08/2018 12/07/2017  Decreased Interest 3 2 3  0 1 0 0  Down, Depressed, Hopeless 3 1 3  0 1 0 0  PHQ - 2 Score 6 3 6  0 2 0 0  Altered sleeping - 2 - 0 - 0 -  Tired, decreased energy - 3 - 0 - 0 -  Change in appetite - 2 - 0 - 0 -  Feeling bad or failure about yourself  - 3 - 0 - 0 -  Trouble concentrating - 2 - 0 - 0 -  Moving slowly or fidgety/restless - 1 - 0 - 0 -  Suicidal thoughts - 0 - 0 - 0 -  PHQ-9 Score - 16 - 0 - 0 -  Difficult doing work/chores - Very difficult - Not difficult at all - Not difficult at all -  Some recent data might be hidden    Review of Systems  Constitutional: Positive for diaphoresis and unexpected weight change.       Gain  HENT: Negative.   Eyes:       Had cataract surgery dec 20, and feb 21  Respiratory: Positive for apnea.   Cardiovascular: Negative.   Gastrointestinal: Positive for constipation.  Endocrine:       High blood sugars  Genitourinary: Negative.   Musculoskeletal: Positive for back pain.  Skin: Negative.   Allergic/Immunologic: Negative.   Neurological: Negative.   Hematological: Negative.   Psychiatric/Behavioral: Positive for dysphoric mood.  All other systems reviewed and are negative.      Objective:   Physical Exam  General: No acute distress HEENT: EOMI, oral membranes moist Cards: reg rate  Chest: normal effort Abdomen: Soft, NT, ND Skin: dry, intact Extremities: no edema Neuro: Pt is cognitively appropriate with normal insight, memory, and awareness. Cranial nerves 2-12 are intact. Sensory exam is normal. Reflexes are 1 to 2+ in all 4's. Fine motor coordination is intact. No tremors.  Motor functions 5 out of 5 but Spurling's and facet maneuvers really were not provocative today. Musculoskeletal:  Traps remain somewhat tight.  Continues to have a head forward posture.  Rhomboids also tight.  Some spasm in the mid thoracic paraspinals right more than  left. Psych:  Patient is pleasant but affect is anxious.             Assessment & Plan:  1. Fibromyalgia: Continue HEp  as possible. Encouraged outdoor activities.   2. Rotator cuff syndrome on the right>left:  continue rotator cuff exercises, and scapular ROM exercises as she's been doing.     3. Cervicalgia. Post-laminectomy syndrome, facet arthropathy:  Cervical Radiculopathy: Continue Gabapentin.                     -MS IR 15mg  q12 prn #60. RF today       -We will continue the controlled substance monitoring program, this consists of regular clinic visits, examinations, routine drug screening, pill counts as well as use of [place] Controlled Substance Reporting System. [place] today.          --? cervical MBB's of cervical spine        -C7-T1 ESI without benefit        -there's a myofascial component to this        -check thoracic xrays today   4. Depression: Continue Cymbalta.   90 mg daily.--increase to 60mg  bid  -made referral to Dr. Darol Destine for coping skills, pain mgt techniques 5. Mid/low back pain/shoulder girdle pain: right neck and shoulder girdle pain appears largely myofascial.         -see above          -continue HEP as possible.   Consider course of outpatient physical therapy        -Tizanidine as needed for muscle spasms which seems to help her 6. Lumbar Radiculopathy:   maintain HEP, posture 7 OA of right hand:    Voltaren gel and heat therapy.              . 8. Migraines: stable, largely driven by shoulder girdle and neck pain   9. Left CTS:S/P EMG on 03/08/2017: S/P Carpal Tunnel Release on 06/14/2017 via . Dr. Apolonio Schneiders.  10. OIC: movantik           -continue diet/probiotic   15 minutes of face to face patient care time were spent during this visit. All questions were encouraged and answered.  Follow up with me in 1 months.

## 2019-12-07 ENCOUNTER — Ambulatory Visit: Payer: Medicare HMO | Admitting: Psychology

## 2020-01-01 ENCOUNTER — Other Ambulatory Visit: Payer: Self-pay | Admitting: Physical Medicine & Rehabilitation

## 2020-01-01 ENCOUNTER — Other Ambulatory Visit: Payer: Self-pay

## 2020-01-01 ENCOUNTER — Ambulatory Visit
Admission: RE | Admit: 2020-01-01 | Discharge: 2020-01-01 | Disposition: A | Discharge status: Home / Self Care | Payer: Medicare HMO | Source: Ambulatory Visit | Attending: Physical Medicine & Rehabilitation | Admitting: Physical Medicine & Rehabilitation

## 2020-01-01 DIAGNOSIS — M5134 Other intervertebral disc degeneration, thoracic region: Secondary | ICD-10-CM | POA: Diagnosis not present

## 2020-01-01 DIAGNOSIS — M549 Dorsalgia, unspecified: Secondary | ICD-10-CM

## 2020-01-02 ENCOUNTER — Telehealth (HOSPITAL_COMMUNITY): Payer: Self-pay | Admitting: Physical Medicine & Rehabilitation

## 2020-01-02 DIAGNOSIS — M7918 Myalgia, other site: Secondary | ICD-10-CM

## 2020-01-03 ENCOUNTER — Other Ambulatory Visit: Payer: Self-pay

## 2020-01-03 ENCOUNTER — Encounter: Payer: Medicare HMO | Attending: Physical Medicine and Rehabilitation | Admitting: Physical Medicine & Rehabilitation

## 2020-01-03 ENCOUNTER — Encounter: Payer: Self-pay | Admitting: Physical Medicine & Rehabilitation

## 2020-01-03 VITALS — BP 113/70 | HR 97 | Temp 97.7°F | Ht 61.0 in | Wt 146.0 lb

## 2020-01-03 DIAGNOSIS — M549 Dorsalgia, unspecified: Secondary | ICD-10-CM | POA: Diagnosis not present

## 2020-01-03 DIAGNOSIS — M797 Fibromyalgia: Secondary | ICD-10-CM | POA: Diagnosis not present

## 2020-01-03 DIAGNOSIS — M25511 Pain in right shoulder: Secondary | ICD-10-CM | POA: Diagnosis not present

## 2020-01-03 DIAGNOSIS — Z5181 Encounter for therapeutic drug level monitoring: Secondary | ICD-10-CM | POA: Diagnosis not present

## 2020-01-03 DIAGNOSIS — G894 Chronic pain syndrome: Secondary | ICD-10-CM | POA: Insufficient documentation

## 2020-01-03 DIAGNOSIS — E1142 Type 2 diabetes mellitus with diabetic polyneuropathy: Secondary | ICD-10-CM | POA: Diagnosis not present

## 2020-01-03 DIAGNOSIS — F339 Major depressive disorder, recurrent, unspecified: Secondary | ICD-10-CM | POA: Insufficient documentation

## 2020-01-03 DIAGNOSIS — M7918 Myalgia, other site: Secondary | ICD-10-CM | POA: Diagnosis not present

## 2020-01-03 DIAGNOSIS — Z79891 Long term (current) use of opiate analgesic: Secondary | ICD-10-CM | POA: Insufficient documentation

## 2020-01-03 DIAGNOSIS — M961 Postlaminectomy syndrome, not elsewhere classified: Secondary | ICD-10-CM

## 2020-01-03 DIAGNOSIS — M609 Myositis, unspecified: Secondary | ICD-10-CM | POA: Insufficient documentation

## 2020-01-03 DIAGNOSIS — R69 Illness, unspecified: Secondary | ICD-10-CM | POA: Diagnosis not present

## 2020-01-03 NOTE — Patient Instructions (Signed)
PLEASE FEEL FREE TO CALL OUR OFFICE WITH ANY PROBLEMS OR QUESTIONS ([phone])      

## 2020-01-03 NOTE — Progress Notes (Signed)
Subjective:    Patient ID: Robin Morgan, female    DOB: 06-16-1956, 64 y.o.   MRN: [id number]  HPI   Robin Morgan is here in follow up of her chronic pain. I sent her for thoracic xrays after last visit. Results were as  Follows: Normal alignment. Vertebral body heights are preserved. Multilevel degenerative disc disease with endplate spurring and mild disc space narrowing. No evidence of focal bone lesion or bony destruction. No fracture. No paravertebral soft tissue abnormality  Previous Cervical MRI from 02/2019 C4-5: Disc bulging, uncovertebral spurring, and mild facet arthrosis result in mild-to-moderate right and mild left neural foraminal stenosis without significant spinal stenosis, unchanged.  C5-6: Broad-based posterior disc osteophyte complex results in mild spinal stenosis with mild ventral cord flattening and severe bilateral neural foraminal stenosis with potential bilateral C6 nerve root impingement, unchanged.  C6-7: A broad-based posterior disc osteophyte complex with slightly more focal right paracentral disc protrusion results in borderline spinal stenosis without neural foraminal stenosis, unchanged.  C7-T1: Disc bulging and mild facet arthrosis without significant stenosis, unchanged.  She continues to have pain along the lower neck but more so along the mid thoracic to upper thoracic spine and shoulder girdle.  Since we last met she complains of pain down the right anterior lateral shoulder.  She finds it difficult at times to sleep on the right side and and struggles with lifting up objects at times.  She does complain of some pain in her legs as well.  As result she has not been doing as much and tends to be in bed a lot.  Mood remains a problem due to the above.  For pain she is using ms IR as well as tizanidine for muscle relaxation and gabapentin for neuropathic pain.   Pain Inventory Average Pain 8 Pain Right Now 8 My pain is constant and  stabbing  In the last 24 hours, has pain interfered with the following? General activity 9 Relation with others 9 Enjoyment of life 9 What TIME of day is your pain at its worst? varies Sleep (in general) Fair  Pain is worse with: walking, sitting and standing Pain improves with: heat/ice and medication Relief from Meds: 8  Mobility walk without assistance ability to climb steps?  yes do you drive?  yes  Function disabled: date disabled .  Neuro/Psych weakness spasms confusion  Prior Studies Any changes since last visit?  no  Physicians involved in your care Any changes since last visit?  no   Family History  Problem Relation Age of Onset  . Alzheimer's disease Mother   . Diabetes Father   . Heart disease Maternal Grandmother   . Breast cancer Paternal Grandmother 51   Social History   Socioeconomic History  . Marital status: Divorced    Spouse name: Not on file  . Number of children: 1  . Years of education: Not on file  . Highest education level: Not on file  Occupational History  . Occupation: disabled  Tobacco Use  . Smoking status: Never Smoker  . Smokeless tobacco: Never Used  Substance and Sexual Activity  . Alcohol use: No  . Drug use: No  . Sexual activity: Not Currently  Other Topics Concern  . Not on file  Social History Narrative  . Not on file   Social Determinants of Health   Financial Resource Strain:   . Difficulty of Paying Living Expenses:   Food Insecurity:   . Worried About Charity fundraiser  in the Last Year:   . [place] in the Last Year:   Transportation Needs:   . Film/video editor (Medical):   Marland Kitchen Lack of Transportation (Non-Medical):   Physical Activity:   . Days of Exercise per Week:   . Minutes of Exercise per Session:   Stress:   . Feeling of Stress :   Social Connections:   . Frequency of Communication with Friends and Family:   . Frequency of Social Gatherings with Friends and Family:   . Attends  Religious Services:   . Active Member of Clubs or Organizations:   . Attends Archivist Meetings:   Marland Kitchen Marital Status:    Past Surgical History:  Procedure Laterality Date  . ABDOMINAL HYSTERECTOMY     1986  . APPENDECTOMY     1983  . BREAST EXCISIONAL BIOPSY Right 1990's  . BREAST SURGERY     rt breast cyst done in the 90's  . CHOLECYSTECTOMY    . GALLBLADDER SURGERY     1983  . ROTATOR CUFF REPAIR     right  . SPINE SURGERY    . TONSILLECTOMY     1973   Past Medical History:  Diagnosis Date  . Calcific tendonitis   . Cervical spondylosis without myelopathy   . Depression   . Diabetes mellitus   . Esophageal stricture   . Fibromyalgia   . GERD (gastroesophageal reflux disease)   . Hiatal hernia   . Hyperlipidemia   . Hypertension    BP 113/70   Pulse 97   Temp 97.7 F (36.5 C)   Ht '5\' 1"'$  (1.549 m)   Wt 146 lb (66.2 kg)   SpO2 94%   BMI 27.59 kg/m   Opioid Risk Score:   Fall Risk Score:  `1  Depression screen PHQ 2/9  Depression screen [facility] 2/9 12/06/2019 12/01/2019 09/27/2019 06/16/2018 03/07/2018 02/08/2018 12/07/2017  Decreased Interest '3 2 3 '$ 0 1 0 0  Down, Depressed, Hopeless '3 1 3 '$ 0 1 0 0  PHQ - 2 Score '6 3 6 '$ 0 2 0 0  Altered sleeping - 2 - 0 - 0 -  Tired, decreased energy - 3 - 0 - 0 -  Change in appetite - 2 - 0 - 0 -  Feeling bad or failure about yourself  - 3 - 0 - 0 -  Trouble concentrating - 2 - 0 - 0 -  Moving slowly or fidgety/restless - 1 - 0 - 0 -  Suicidal thoughts - 0 - 0 - 0 -  PHQ-9 Score - 16 - 0 - 0 -  Difficult doing work/chores - Very difficult - Not difficult at all - Not difficult at all -  Some recent data might be hidden    Review of Systems  Constitutional: Negative.   HENT: Negative.   Eyes: Positive for visual disturbance.  Respiratory: Positive for apnea, cough and shortness of breath.   Gastrointestinal: Positive for abdominal pain and constipation.  Endocrine:       High/low blood sugar  Musculoskeletal:  Positive for arthralgias, back pain, gait problem and neck pain.  Skin: Negative.   Allergic/Immunologic: Negative.   Neurological: Positive for weakness.  Psychiatric/Behavioral: Negative.   All other systems reviewed and are negative.      Objective:   Physical Exam General: No acute distress HEENT: EOMI, oral membranes moist Cards: reg rate  Chest: normal effort Abdomen: Soft, NT, ND Skin: dry, intact Extremities: no edema  Neuro: Pt is cognitively appropriate with normal insight, memory, and awareness. Cranial nerves 2-12 are intact. Sensory exam is normal.  Fine motor coordination is intact. No tremors.    Motor function remains grossly 5 out of 5 with no focal weakness or sensory loss appreciated.  Reflexes are 1+ the only true weakness that I see is because of pain.  Spurling's test is negative. Musculoskeletal:  thoracic paraspinals, Traps and rhomboids tight. +speeds test R>L. +/- impingement sign bilaterally.  Psych:  Patient is pleasant but often anxious, sometimes flat             Assessment & Plan:  1. Fibromyalgia: Continue HEp as possible. Encouraged outdoor activities.   2. Rotator cuff syndrome on the right>left:   Patient's exam is more impressive today for bicipital tendinitis.  -I added further instructions for physical therapy at [facility] hospital to address the long head biceps tendinitis.  I can work on some additional range of motion for the shoulders as well.     3. Cervicalgia. Post-laminectomy syndrome, facet arthropathy:  Cervical Radiculopathy: Continue Gabapentin.                     -MS IR '15mg'$  q12 prn #60. RF today       -We will continue the controlled substance monitoring program, this consists of regular clinic visits, examinations, routine drug screening, pill counts as well as use of [place] Controlled Substance Reporting System. [place] today.          --? cervical MBB's of cervical spine        -C7-T1 ESI without  benefit        -there's a myofascial component to this        -X-rays of the thoracic spine and MRI of the cervical spine were reviewed with the patient.  If she does not find improvement with therapy we will consider a neurosurgical referral  4. Depression: Continue Cymbalta.   90 mg daily.--increase to '60mg'$  bid        -Await visit with neuropsychology for coping skills, pain mgt techniques 5. Mid/low back pain/shoulder girdle pain: right neck and shoulder girdle pain appears largely myofascial.         -Referral for outpatient physical therapy was made to address shoulder girdle and upper back range of motion, modalities, posture and ergonomics as well as home exercise program        -Tizanidine as needed for muscle spasms which seems to help her.  Encouraged scheduled use of this at bedtime.  6. Lumbar Radiculopathy:   maintain HEP, posture 7 OA of right hand:    Voltaren gel and heat therapy.              . 8. Migraines: stable, largely driven by shoulder girdle and neck pain.  See above  9. Left CTS:S/P EMG on 03/08/2017: S/P Carpal Tunnel Release on 06/14/2017 via . Dr. Apolonio Schneiders.  10. OIC: [employer]antik           -continue diet/probiotic  15 minutes of face to face patient care time were spent during this visit. All questions were encouraged and answered.  Follow up with me in a month .

## 2020-01-10 ENCOUNTER — Encounter: Payer: Self-pay | Admitting: Psychology

## 2020-01-10 ENCOUNTER — Other Ambulatory Visit: Payer: Self-pay

## 2020-01-10 ENCOUNTER — Encounter: Payer: Medicare HMO | Attending: Physical Medicine and Rehabilitation | Admitting: Psychology

## 2020-01-10 DIAGNOSIS — F339 Major depressive disorder, recurrent, unspecified: Secondary | ICD-10-CM | POA: Diagnosis not present

## 2020-01-10 DIAGNOSIS — E1142 Type 2 diabetes mellitus with diabetic polyneuropathy: Secondary | ICD-10-CM | POA: Insufficient documentation

## 2020-01-10 DIAGNOSIS — M549 Dorsalgia, unspecified: Secondary | ICD-10-CM | POA: Diagnosis not present

## 2020-01-10 DIAGNOSIS — Z79891 Long term (current) use of opiate analgesic: Secondary | ICD-10-CM | POA: Diagnosis not present

## 2020-01-10 DIAGNOSIS — G894 Chronic pain syndrome: Secondary | ICD-10-CM | POA: Diagnosis not present

## 2020-01-10 DIAGNOSIS — Z5181 Encounter for therapeutic drug level monitoring: Secondary | ICD-10-CM | POA: Insufficient documentation

## 2020-01-10 DIAGNOSIS — F411 Generalized anxiety disorder: Secondary | ICD-10-CM | POA: Diagnosis not present

## 2020-01-10 DIAGNOSIS — M7918 Myalgia, other site: Secondary | ICD-10-CM | POA: Insufficient documentation

## 2020-01-10 DIAGNOSIS — M609 Myositis, unspecified: Secondary | ICD-10-CM | POA: Diagnosis present

## 2020-01-10 DIAGNOSIS — M797 Fibromyalgia: Secondary | ICD-10-CM | POA: Diagnosis not present

## 2020-01-10 DIAGNOSIS — M25511 Pain in right shoulder: Secondary | ICD-10-CM | POA: Diagnosis not present

## 2020-01-10 DIAGNOSIS — M961 Postlaminectomy syndrome, not elsewhere classified: Secondary | ICD-10-CM | POA: Diagnosis not present

## 2020-01-10 DIAGNOSIS — R69 Illness, unspecified: Secondary | ICD-10-CM | POA: Diagnosis not present

## 2020-01-10 NOTE — Progress Notes (Signed)
Neuropsychological Consultation   Patient:   Robin Morgan   DOB:   06-20-1956  MR Number:  [id number]  Location:  [facility] [id number] [place] [address] Dept: [phone]           Date of Service:   01/10/2020  Start Time:   10 AM End Time:   12 PM  Provider/Observer:  Ilean Skill, Psy.D.       Clinical Neuropsychologist       Billing Code/Service: [id number]  Chief Complaint:    Robin Morgan is a 64 year old right-handed female referred by Dr. Naaman Plummer for neuropsychological/psychological consultation due to significant depression and anxiety with primary stressors related to family issues in the setting of fibromyalgia and chronic severe pain symptoms.  The patient reports that she has had significant pain in her back primarily mid back for 20 years or more.  The patient is also been diagnosed with fibromyalgia.  The patient had surgery on her neck to try to alleviate help with her severe recurrent headaches but experience little change post surgery.  The patient also has significant shoulder pain and severe headache with these pain and headaches resulting in her having to spend multiple days at a time essentially in bed.  Reason for Service:  Robin Morgan is a 64 year old right-handed female referred by Dr. Naaman Plummer for neuropsychological/psychological consultation due to significant depression and anxiety with primary stressors related to family issues in the setting of fibromyalgia and chronic severe pain symptoms.  The patient reports that she has had significant pain in her back primarily mid back for 20 years or more.  The patient is also been diagnosed with fibromyalgia.  The patient had surgery on her neck to try to alleviate help with her severe recurrent headaches but experience little change post surgery.  The patient also has significant  shoulder pain and severe headache with these pain and headaches resulting in her having to spend multiple days at a time essentially in bed.  The patient has a past medical history of recurrent depression and anxiety symptoms along with fibromyalgia, insomnia due to her medical condition/pain, cervical postlaminectomy syndrome, degenerative disc disease lumbar, hypertension, hyperlipidemia, osteopenia, hypothyroidism, carpal tunnel syndrome, obstructive sleep apnea and regular chronic use of prescription opiates.  The patient has had multiple surgeries in the past including right hand surgery in 1994, neck surgery in 1994, left hand surgery in 2019 and right wrist surgery in 2019.  The patient has had multiple abnormalities noted on MRIs of her cervical lower lumbar region.  These can be found in her EMR.  The patient describes significant family psychosocial stressors that have been ongoing.  The primary culprit of many of her stressors have to do with a very controlling and manipulative younger sister.  The patient and her 2 sisters have been providing care 1st for their mother who was diagnosed with Alzheimer's disease and she and her sisters were the primary caretakers and now they are taking care of their 52 year old father.  The youngest sister is constantly trying to manipulate and control almost all aspects of their father's life.  However, the younger sister is essentially stopped doing day-to-day care such as helping to provide food.  Because of the patient's physical limitations the middle sister has had much more of a burden placed on her as far as these meal preparations and care for the father leaving the  patient to feel guilty for her inability to do as much physical help for the father.  These difficulties with the youngest sister have been going on throughout life and going back to childhood.  The patient is very stressed by these conflicts and feels guilty about not being able to provide as  much help that she would like for her father.  The patient describes very poor sleep patterns.  The patient reports that she has trouble going to sleep and staying asleep.  The patient also reports that sometimes she has trouble waking up in the morning when she has very difficult sleep patterns at night.  The patient describes an appetite that is quite variable at times and some memory issues that are likely directly associated with her anxiety and stress levels.  The patient reports that her physical difficulties leave her unable to assist with her father in preparing food and assisting around the family farm and cleaning the house.  The patient reports that she has had increases in her headaches and is having more emotional responses recently with exacerbation of her depression and anxiety type symptoms.  Behavioral Observation: Robin Morgan  presents as a 64 y.o.-year-old Right Caucasian Female who appeared her stated age. her dress was Appropriate and she was Well Groomed and her manners were Appropriate to the situation.  her participation was indicative of Appropriate and Redirectable behaviors.  There were any physical disabilities noted.  she displayed an appropriate level of cooperation and motivation.     Interactions:    Active Appropriate and Redirectable  Attention:   abnormal and the patient was distracted by some internal preoccupations and concerns/worries.  Memory:   within normal limits; recent and remote memory intact  Visuo-spatial:  within normal limits  Speech (Volume):  normal  Speech:   normal; normal  Thought Process:  Coherent and Relevant  Though Content:  WNL; not suicidal and not homicidal  Orientation:   person, place, time/date and situation  Judgment:   Good  Planning:   Fair  Affect:    Anxious  Mood:    Anxious and Dysphoric  Insight:   Good  Intelligence:   normal  Marital Status/Living: The patient was born and raised in [place] along with 2 siblings.  The patient is the oldest of the 3 children.  The patient reports that when she was born she was born somewhat preterm and weighed slightly over 5 pounds at birth.  Developmental milestones were achieved at the appropriate time.  The patient does report that she had a lot of anxiety this started before she was 64 years old.  The patient currently lives by herself.  The patient is divorced.  She was married in 1975 and was married for 30+ years.  The patient has one 77 year old child who is doing well now but was premature at birth.  Current Employment: The patient is retired.  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  Hobbies and interests have included sewing, home decorating and indoor flowers.  Substance Use:  No concerns of substance abuse are reported.  The patient denies any substance use.  The patient does take medicines for her pain but has always had appropriate urine drug screens.  Education:   HS Graduate  Medical History:   Past Medical History:  Diagnosis Date  . Calcific tendonitis   .  Cervical spondylosis without myelopathy   . Depression   . Diabetes mellitus   . Esophageal stricture   . Fibromyalgia   . GERD (gastroesophageal reflux disease)   . Hiatal hernia   . Hyperlipidemia   . Hypertension         Abuse/Trauma History: While the patient does not describe any significant acute traumatic experience that she has long-term stress primarily with challenges and difficulties with one of her sisters as well as stressors helping to take care of her aging parents through the years.  Psychiatric History:  The patient has a past psychiatric history of major depression recurrent without psychotic features as well as significant anxiety features.  The patient's generalized anxiety symptoms come back to at least 64 years of  age.  Family Med/Psych History:  Family History  Problem Relation Age of Onset  . Alzheimer's disease Mother   . Diabetes Father   . Heart disease Maternal Grandmother   . Breast cancer Paternal Grandmother 24    Risk of Suicide/Violence: virtually non-existent the patient denies any suicidal homicidal ideation.  Impression/DX:  Robin Morgan is a 64 year old right-handed female referred by Dr. Naaman Plummer for neuropsychological/psychological consultation due to significant depression and anxiety with primary stressors related to family issues in the setting of fibromyalgia and chronic severe pain symptoms.  The patient reports that she has had significant pain in her back primarily mid back for 20 years or more.  The patient is also been diagnosed with fibromyalgia.  The patient had surgery on her neck to try to alleviate help with her severe recurrent headaches but experience little change post surgery.  The patient also has significant shoulder pain and severe headache with these pain and headaches resulting in her having to spend multiple days at a time essentially in bed.  I do think that the patient's anxiety, stress and depressive symptoms do play and exacerbating role in her overall pain symptoms and while she clearly has abnormalities in lumbar and cervical regions as a primary factor for her pain symptoms her stress responses, fibromyalgia and depression do play a role in the acute day-to-day level of her pain.  Disposition/Plan:  We have set the patient up for formal psychological interventions around building better coping skills and strategies to manage with primary stressors in her life as well as develop appropriate treatment and coping strategies around her depression and anxiety symptoms and fibromyalgia components.  Diagnosis:    Fibromyalgia  Depression, recurrent (HCC)  Chronic pain syndrome  Generalized anxiety disorder         Electronically  Signed   _______________________ Ilean Skill, Psy.D.

## 2020-01-18 ENCOUNTER — Other Ambulatory Visit: Payer: Self-pay | Admitting: Physical Medicine & Rehabilitation

## 2020-01-18 DIAGNOSIS — M7918 Myalgia, other site: Secondary | ICD-10-CM

## 2020-01-18 DIAGNOSIS — M797 Fibromyalgia: Secondary | ICD-10-CM

## 2020-01-22 ENCOUNTER — Other Ambulatory Visit: Payer: Self-pay | Admitting: Registered Nurse

## 2020-01-22 DIAGNOSIS — M797 Fibromyalgia: Secondary | ICD-10-CM

## 2020-02-07 ENCOUNTER — Other Ambulatory Visit: Payer: Self-pay

## 2020-02-07 ENCOUNTER — Encounter: Payer: Self-pay | Admitting: Physical Medicine & Rehabilitation

## 2020-02-07 ENCOUNTER — Encounter (HOSPITAL_BASED_OUTPATIENT_CLINIC_OR_DEPARTMENT_OTHER): Payer: Medicare HMO | Admitting: Physical Medicine & Rehabilitation

## 2020-02-07 VITALS — BP 122/73 | HR 103 | Temp 99.0°F | Ht 61.0 in | Wt 148.0 lb

## 2020-02-07 DIAGNOSIS — M961 Postlaminectomy syndrome, not elsewhere classified: Secondary | ICD-10-CM | POA: Diagnosis not present

## 2020-02-07 DIAGNOSIS — M549 Dorsalgia, unspecified: Secondary | ICD-10-CM | POA: Diagnosis not present

## 2020-02-07 DIAGNOSIS — M25511 Pain in right shoulder: Secondary | ICD-10-CM | POA: Diagnosis not present

## 2020-02-07 DIAGNOSIS — M7521 Bicipital tendinitis, right shoulder: Secondary | ICD-10-CM

## 2020-02-07 DIAGNOSIS — M7918 Myalgia, other site: Secondary | ICD-10-CM | POA: Diagnosis not present

## 2020-02-07 DIAGNOSIS — Z5181 Encounter for therapeutic drug level monitoring: Secondary | ICD-10-CM | POA: Diagnosis not present

## 2020-02-07 DIAGNOSIS — G894 Chronic pain syndrome: Secondary | ICD-10-CM

## 2020-02-07 DIAGNOSIS — M797 Fibromyalgia: Secondary | ICD-10-CM | POA: Diagnosis not present

## 2020-02-07 DIAGNOSIS — Z79891 Long term (current) use of opiate analgesic: Secondary | ICD-10-CM | POA: Diagnosis not present

## 2020-02-07 DIAGNOSIS — R69 Illness, unspecified: Secondary | ICD-10-CM | POA: Diagnosis not present

## 2020-02-07 DIAGNOSIS — E1142 Type 2 diabetes mellitus with diabetic polyneuropathy: Secondary | ICD-10-CM | POA: Diagnosis not present

## 2020-02-07 MED ORDER — MORPHINE SULFATE 15 MG PO TABS: 15.0000 mg | ORAL_TABLET | Freq: Three times a day (TID) | ORAL | 0 refills | Status: DC | PRN

## 2020-02-07 NOTE — Progress Notes (Signed)
Subjective:    Patient ID: Robin Morgan, female    DOB: Oct 16, 1955, 64 y.o.   MRN: [id number]  HPI   [name] is here in follow up of her chronic pain. She reports ongoing pain in the right shoulder which is triggered with flexion as well as rotation of the shoulder.  She also notices it when she lifts her right arm out to the side or out in front of her body.  Pain will shoot up sometimes into her neck.  Additionally she will have pain radiating from her neck into the shoulders.  She has not had any sessions of therapy due to a backlog at the center.  He does like her center therapist.  She finds the tizanidine really helps her neck spasms and tends to help her headaches as well.  She uses morphine for more generalized severe pain.  [name] also notes at times tingling in her legs but attributes this more to her fibromyalgia.   Pain Inventory Average Pain 8 Pain Right Now 8 My pain is constant, stabbing and aching  In the last 24 hours, has pain interfered with the following? General activity 9 Relation with others 9 Enjoyment of life 7 What TIME of day is your pain at its worst? all Sleep (in general) Fair  Pain is worse with: sitting and standing Pain improves with: heat/ice and medication Relief from Meds: 8  Mobility walk without assistance ability to climb steps?  yes do you drive?  yes  Function disabled: date disabled .  Neuro/Psych bladder control problems weakness spasms confusion  Prior Studies Any changes since last visit?  no  Physicians involved in your care Any changes since last visit?  no   Family History  Problem Relation Age of Onset  . Alzheimer's disease Mother   . Diabetes Father   . Heart disease Maternal Grandmother   . Breast cancer Paternal Grandmother 51   Social History   Socioeconomic History  . Marital status: Divorced    Spouse name: Not on file  . Number of children: 1  . Years of education: Not on file  . Highest education  level: Not on file  Occupational History  . Occupation: disabled  Tobacco Use  . Smoking status: Never Smoker  . Smokeless tobacco: Never Used  Vaping Use  . Vaping Use: Never used  Substance and Sexual Activity  . Alcohol use: No  . Drug use: No  . Sexual activity: Not Currently  Other Topics Concern  . Not on file  Social History Narrative  . Not on file   Social Determinants of Health   Financial Resource Strain:   . Difficulty of Paying Living Expenses:   Food Insecurity:   . Worried About Charity fundraiser in the Last Year:   . Arboriculturist in the Last Year:   Transportation Needs:   . Film/video editor (Medical):   Marland Kitchen Lack of Transportation (Non-Medical):   Physical Activity:   . Days of Exercise per Week:   . Minutes of Exercise per Session:   Stress:   . Feeling of Stress :   Social Connections:   . Frequency of Communication with Friends and Family:   . Frequency of Social Gatherings with Friends and Family:   . Attends Religious Services:   . Active Member of Clubs or Organizations:   . Attends Archivist Meetings:   Marland Kitchen Marital Status:    Past Surgical History:  Procedure Laterality  Date  . ABDOMINAL HYSTERECTOMY     1986  . APPENDECTOMY     1983  . BREAST EXCISIONAL BIOPSY Right 1990's  . BREAST SURGERY     rt breast cyst done in the 90's  . CHOLECYSTECTOMY    . GALLBLADDER SURGERY     1983  . ROTATOR CUFF REPAIR     right  . SPINE SURGERY    . TONSILLECTOMY     1973   Past Medical History:  Diagnosis Date  . Calcific tendonitis   . Cervical spondylosis without myelopathy   . Depression   . Diabetes mellitus   . Esophageal stricture   . Fibromyalgia   . GERD (gastroesophageal reflux disease)   . Hiatal hernia   . Hyperlipidemia   . Hypertension    There were no vitals taken for this visit.  Opioid Risk Score:   Fall Risk Score:  `1  Depression screen PHQ 2/9  Depression screen [facility] 2/9 12/06/2019 12/01/2019  09/27/2019 06/16/2018 03/07/2018 02/08/2018 12/07/2017  Decreased Interest 3 2 3  0 1 0 0  Down, Depressed, Hopeless 3 1 3  0 1 0 0  PHQ - 2 Score 6 3 6  0 2 0 0  Altered sleeping - 2 - 0 - 0 -  Tired, decreased energy - 3 - 0 - 0 -  Change in appetite - 2 - 0 - 0 -  Feeling bad or failure about yourself  - 3 - 0 - 0 -  Trouble concentrating - 2 - 0 - 0 -  Moving slowly or fidgety/restless - 1 - 0 - 0 -  Suicidal thoughts - 0 - 0 - 0 -  PHQ-9 Score - 16 - 0 - 0 -  Difficult doing work/chores - Very difficult - Not difficult at all - Not difficult at all -  Some recent data might be hidden    Review of Systems  Constitutional: Positive for diaphoresis and unexpected weight change.  HENT: Negative.   Eyes: Negative.   Respiratory: Positive for apnea, cough and shortness of breath.   Cardiovascular: Negative.   Gastrointestinal: Positive for abdominal pain and constipation.  Endocrine:       High/low blood sugar  Genitourinary: Negative.   Musculoskeletal: Positive for arthralgias and back pain.       Spasms   Skin: Negative.   Allergic/Immunologic: Negative.   Neurological: Positive for weakness.  Hematological: Negative.   Psychiatric/Behavioral: Negative.   All other systems reviewed and are negative.      Objective:   Physical Exam  General: No acute distress HEENT: EOMI, oral membranes moist Cards: reg rate  Chest: normal effort Abdomen: Soft, NT, ND Skin: dry, intact Extremities: no edema  Neuro: Pt is cognitively appropriate with normal insight, memory, and awareness. Cranial nerves 2-12 are intact. Sensory exam is normal.  Fine motor coordination is intact. No tremors.    Motor function remains grossly 5 out of 5 with no focal weakness or sensory loss appreciated although there is pain inhibition in proximal UE's.  Reflexes are 1+.  Spurling's test remains negative. Musculoskeletal:  thoracic paraspinals, Traps and rhomboids tight still, right infraspinatus TP. +speeds  test R>L present. Head forward posture.. +/- impingement sign bilaterally.  Psych:  pleasant             Assessment & Plan:  1. Fibromyalgia: Continue HEp as possible. Encouraged outdoor activities.   2. Rotator cuff syndrome on the right>left:   Patient's exam remains more impressive for  biceps tendonitis once again today  -After informed consent and preparation of the skin with betadine and isopropyl alcohol, I injected 6mg  (1cc) of celestone and 4cc of 1% lidocaine around the right biceps long head origin via anterior approach. Additionally, aspiration was performed prior to injection. The patient tolerated well, and no complications were encountered. Afterward the area was cleaned and dressed. Post- injection instructions were provided.               3. Cervicalgia. Post-laminectomy syndrome, facet arthropathy:  Cervical Radiculopathy: Continue Gabapentin.                     -MS IR 15mg  q12 prn #60. RF today       -We will continue the controlled substance monitoring program, this consists of regular clinic visits, examinations, routine drug screening, pill counts as well as use of [place] Controlled Substance Reporting System. [place] was [place] today.          --? cervical MBB's of cervical spine        -C7-T1 ESI without benefit        -there's a myofascial component to this        -will make referral to NS to assess neck esp at C5-6 where there is disc and facet degeneration leading to fromainal stenosis  -therapy pending to address scapular and cervical posture and ROM, modalities, HEP  -continue tizanidine for muscle spasms. Also helps headaches  -After informed consent and preparation of the skin with isopropyl alcohol, I injected the right infraspinatus with 2cc of 1% lidocaine. The patient tolerated well, and no complications were experienced. Post-injection instructions were provided.  4. Depression: Continue Cymbalta.   90 mg daily.--increase to 60mg  bid         -neuropsych for pain mgt techniques, coping skills 6. Lumbar Radiculopathy:   maintain HEP, posture 7 OA of right hand:    Voltaren gel and heat therapy.              . 8. Migraines: stable, largely driven by shoulder girdle and neck pain.  See above  9. Left CTS:S/P EMG on 03/08/2017: S/P Carpal Tunnel Release on 06/14/2017 via . Dr. Apolonio Schneiders.  10. OIC: movantik           -continue diet/probiotic   15 minutes of face to face patient care time were spent during this visit. All questions were encouraged and answered.  Follow up with me in a month .

## 2020-02-07 NOTE — Patient Instructions (Signed)
PLEASE FEEL FREE TO CALL OUR OFFICE WITH ANY PROBLEMS OR QUESTIONS ([phone])      

## 2020-02-14 ENCOUNTER — Ambulatory Visit: Payer: Medicare HMO | Attending: Physical Medicine & Rehabilitation | Admitting: Physical Therapy

## 2020-02-14 ENCOUNTER — Other Ambulatory Visit: Payer: Self-pay

## 2020-02-14 ENCOUNTER — Encounter: Payer: Self-pay | Admitting: Physical Therapy

## 2020-02-14 DIAGNOSIS — M25511 Pain in right shoulder: Secondary | ICD-10-CM | POA: Diagnosis not present

## 2020-02-14 DIAGNOSIS — G8929 Other chronic pain: Secondary | ICD-10-CM | POA: Insufficient documentation

## 2020-02-14 DIAGNOSIS — M542 Cervicalgia: Secondary | ICD-10-CM | POA: Insufficient documentation

## 2020-02-14 DIAGNOSIS — R293 Abnormal posture: Secondary | ICD-10-CM | POA: Diagnosis not present

## 2020-02-14 DIAGNOSIS — M7918 Myalgia, other site: Secondary | ICD-10-CM | POA: Insufficient documentation

## 2020-02-14 NOTE — Therapy (Signed)
[facility] [address] [place], [address] Phone: [phone]   Fax:  [phone]  Physical Therapy Evaluation  Patient Details  Name: Robin Morgan MRN: [id number] Date of Birth: [birth year] No data recorded  Encounter Date: 02/14/2020   PT End of Session - 02/14/20 1724    Visit Number 1    Number of Visits 16    Date for PT Re-Evaluation 04/10/20    PT Start Time 2025    PT Stop Time 1445    PT Time Calculation (min) 60 min    Activity Tolerance Patient tolerated treatment well    Behavior During Therapy [facility] for tasks assessed/performed           Past Medical History:  Diagnosis Date  . Calcific tendonitis   . Cervical spondylosis without myelopathy   . Depression   . Diabetes mellitus   . Esophageal stricture   . Fibromyalgia   . GERD (gastroesophageal reflux disease)   . Hiatal hernia   . Hyperlipidemia   . Hypertension     Past Surgical History:  Procedure Laterality Date  . ABDOMINAL HYSTERECTOMY     1986  . APPENDECTOMY     1983  . BREAST EXCISIONAL BIOPSY Right 1990's  . BREAST SURGERY     rt breast cyst done in the 90's  . CHOLECYSTECTOMY    . GALLBLADDER SURGERY     1983  . ROTATOR CUFF REPAIR     right  . SPINE SURGERY    . TONSILLECTOMY     1973    There were no vitals filed for this visit.    Subjective Assessment - 02/14/20 1719    Pertinent History Pt is a 64 y.o retired female reporting to PT with chronic neck and R shoulder pain.  Pain is described as sharp shooting pain that begins as a headache-like pain and radiates into the R shoulder and proximal arm (Worst: 9/10, Best and Present: 7/10).  Neck pain has been present for the past 20 years, radiating pain began 5-6 months ago with no reported MOI or prior hx of trauma.  Pt has hx of cervical laminectomy and R RC surgery in the 90s, and injections in R shoulder (most recent on 02/07/20).  Pain worsens  throughout the day and is aggravated by push/pull activities, picking up objects, lifting, and getting in and out of car; eases with rest, ice and pain medication.  Pt reports hx of PT that was unsuccessful and would like to decrease pain with therapy.  PMH of DM and HTN.  Pt denies N/V, B&B changes, unexplained weight fluctuation, saddle paresthesia, fever, night sweats, or unrelenting night pain at this time.    Limitations Sitting;House hold activities;Lifting;Standing    How long can you sit comfortably? Immediate pain    How long can you stand comfortably? 15-20 min    How long can you walk comfortably? Unlimited, unless pain already aggravated by daily activities    Patient Stated Goals Decrease cervical and shoulder pain    Currently in Pain? Yes    Pain Score 7     Pain Location Neck    Pain Orientation Right    Pain Descriptors / Indicators Sharp;Shooting;Headache    Pain Type Chronic pain    Pain Radiating Towards R shoulder and upper arm proximal to elbow    Pain Onset More than a month ago    Pain Frequency Constant    Aggravating  Factors  Getting in and out of car, picking up objects, lifting, push/pull activities (ie. vacuuming)    Pain Relieving Factors Pain medications, ice, rest    Effect of Pain on Daily Activities Interferes with ADLs and household chores             OBJECTIVE  Mental Status Patient is oriented to person, place and time.  Recent memory is intact.  Remote memory is intact.  Attention span and concentration are intact.  Expressive speech is intact.  Patient's fund of knowledge is within normal limits for educational level.  SENSATION: Grossly intact to light touch bilateral UE as determined by testing dermatomes C2-T2 Proprioception and hot/cold testing deferred on this date   MUSCULOSKELETAL: Tremor: None Bulk: Normal Tone: Normal  Posture Forward head, rounded shoulders, uppercrossed, stiffened posture  Palpation Myofascial tightness  and TrP noted in B upper traps R>L and B suboccipitals.  TTP at C5-7, pt identified as location of neck pain.  TTP along bicipital groove and myofascial tightness noted in B pec major.  Strength R/L 4*/5 Shoulder flexion 4*/5 Shoulder abduction  4+/4 Shoulder external rotation  4+/5 Shoulder internal rotation  5/5 Shoulder extension 5/5 Shoulder horizontal abduction 3/3 Y 4+/4+ T 5/5 I 5/5 Elbow flexion  4+/5 Elbow extension 5/5 Wrist Extension  5/5 Wrist Flexion  3/4 Finger adduction  AROM R/L Cervical: 30 Cervical Flexion 30 Cervical Extension 25*/40 Cervical Lateral Flexion  30*/30 Cervical Rotation  *Indicates pain, overpressure performed unless otherwise indicated  Shoulder AROM: Grossly WNL B  Scapulothoracic rhythm: Abnormal, scapular dyskinesis with elevation and protraction with shoulder flexion  PROM Shoulder: grossly WNL and pain-free    Passive Accessory Intervertebral Motion (PAIVM) Pt denies reproduction of neck pain with CPA C2-T7 and UPA bilaterally C2-T7.   SPECIAL TESTS Spurlings A: Negative B Spurlings B: Negative B  Bicep Tendon Pathology Speed: Positive Yergason's: Negative  Subacromial Impingement Hawkins-Kennedy: Positive Painful Arc: Negative Neer: Negative Empty Can: Negative    Ther-Ex PT reviewed the following HEP with patient with patient able to demonstrate a set of the following with min cuing for correction needed. PT educated patient on parameters of therex (how/when to inc/decrease intensity, frequency, rep/set range, stretch hold time, and purpose of therex) with verbalized understanding.  Scapular retractions 7x/weekly - 8x/day - 10reps -3-5sec hold Cervical retractions 7x/weekly - 8x/day - 10reps -3-5sec hold UT stretch 7x/weekly - 3x/day - 30-60sec hold  Objective measurements completed on examination: See above findings.               PT Education - 02/14/20 1718    Education Details Patient was educated on  diagnosis, anatomy and pathology involved, prognosis, role of PT, and was given an HEP, demonstrating exercise with proper form following verbal and tactile cues, and was given a paper hand out to continue exercise at home. Pt was educated on and agreed to plan of care.    Person(s) Educated Patient    Methods Explanation;Demonstration;Tactile cues;Verbal cues;Handout    Comprehension Verbalized understanding;Returned demonstration;Verbal cues required;Need further instruction;Tactile cues required            PT Short Term Goals - 02/14/20 1727      PT SHORT TERM GOAL #1   Title Pt will be independent with HEP in order to improve strength and reduce neck pain in order to improve pain-free function with ADLs.    Baseline 02/14/20 Pt educated on HEP and handout given    Time 4    Period  Weeks    Status New    Target Date 03/13/20             PT Long Term Goals - 02/14/20 1729      PT LONG TERM GOAL #1   Title Pt will increase FOTO score to 45 to demonstrate increased functional mobility with ADLs.    Baseline 02/14/20 FOTO 37    Time 8    Period Weeks    Status New    Target Date 04/10/20      PT LONG TERM GOAL #2   Title Pt will decreased worst neck pain as reported on NPRS by at least 2 points to demonstrate clinically significant reduction in neck pain.    Baseline 02/14/20 Worst: 9/10    Time 8    Period Weeks    Status New    Target Date 04/10/20      PT LONG TERM GOAL #3   Title Pt will increase R shoulder flexion by 1/2 MMT grade in order to demonstrate improvement in strength and function.    Baseline 02/14/20 R shoulder flexion 4/5 and painful    Time 8    Period Weeks    Status New    Target Date 04/10/20      PT LONG TERM GOAL #4   Title Pt will be able to vacuum for 10 minutes without pain to demonstrate improved pain-free functional mobility with ADLs.    Baseline 02/14/20 vacuuming brings on 9/10 pain, unable to perform    Time 8    Period Weeks    Status New     Target Date 04/10/20                  Plan - 02/14/20 1806    Clinical Impression Statement Pt is a pleasant 63 y.o female referred for neck pain.  PT examination reveals deficits consistent with cervical pain with RUE radicular pain associated with scapulothoracic stability and postural deficits.   Pt presents with deficits in cervical ROM, RUE strength, scapulothoracic motor control, posture, and pain.  Pt activity limitations with car transfers, sitting, lifting, and carrying interfere with full-participation with ADLs.  Pt will benefit from skilled PT services to address deficits and return to pain-free function at home and in the community.    Personal Factors and Comorbidities Age;Comorbidity 1;Comorbidity 2    Comorbidities DM, HTN    Examination-Activity Limitations Carry;Lift;Sit;Stand;Reach Overhead    Examination-Participation Restrictions Cleaning;Laundry;Meal Prep;Driving;Community Activity    Stability/Clinical Decision Making Evolving/Moderate complexity    Clinical Decision Making Moderate    Rehab Potential Fair    PT Frequency 2x / week    PT Duration 8 weeks    PT Treatment/Interventions ADLs/Self Care Home Management;Cryotherapy;Electrical Stimulation;Moist Heat;Traction;Functional mobility training;Therapeutic activities;Therapeutic exercise;Balance training;Neuromuscular re-education;Patient/family education;Manual techniques;Passive range of motion;Energy conservation;Dry needling    PT Next Visit Plan STM to upper traps/occipitals, scapulothoracic motor control    PT Home Exercise Plan Scapular and cervial retractions, upper trap stretch    Consulted and Agree with Plan of Care Patient           Patient will benefit from skilled therapeutic intervention in order to improve the following deficits and impairments:  Decreased activity tolerance, Decreased range of motion, Decreased strength, Hypomobility, Impaired UE functional use, Improper body mechanics,  Pain, Postural dysfunction, Impaired flexibility, Increased muscle spasms, Decreased mobility, Increased fascial restricitons  Visit Diagnosis: Chronic cervical pain  Chronic right shoulder pain  Abnormal posture  Problem List Patient Active Problem List   Diagnosis Date Noted  . Biceps tendinitis, right 02/07/2020  . Mid back pain 12/06/2019  . Insomnia due to medical condition 12/01/2019  . Chronic prescription opiate use 09/02/2018  . Esophageal stricture   . Tenosynovitis, wrist 10/07/2017  . Trigger thumb of left hand 10/07/2017  . Laryngopharyngeal reflux (LPR) 08/26/2017  . Dysphagia 08/26/2017  . Left carpal tunnel syndrome 03/08/2017  . Diabetic peripheral neuropathy associated with type 2 diabetes mellitus ([place]) 01/06/2017  . OSA (obstructive sleep apnea) 03/10/2016  . Fibromyalgia 02/12/2016  . Controlled diabetes mellitus with diabetic neuropathy, with long-term current use of insulin ([place]) 10/11/2014  . HTN (hypertension) 10/11/2014  . Hyperlipidemia 10/11/2014  . DDD (degenerative disc disease), lumbar 01/15/2014  . Hypothyroidism, acquired 06/12/2013  . Cervical post-laminectomy syndrome 05/10/2013  . Osteopenia 12/01/2012  . Chronic diarrhea 07/04/2012  . Myofascial muscle pain 10/28/2011  . Depression, recurrent ([place]) 10/28/2011  . Chronic pain associated with significant psychosocial dysfunction 01/03/2010    Durwin Reges DPT Chinita Greenland, SPT Chinita Greenland 02/15/2020, 3:26 PM  [facility] [address] [place], [address] Phone: [phone]   Fax:  [phone]  Name: MIKAEL DEBELL MRN: [id number] Date of Birth: September 03, 1955

## 2020-02-19 ENCOUNTER — Ambulatory Visit (INDEPENDENT_AMBULATORY_CARE_PROVIDER_SITE_OTHER): Payer: Medicare HMO | Admitting: Family Medicine

## 2020-02-19 ENCOUNTER — Other Ambulatory Visit: Payer: Self-pay

## 2020-02-19 ENCOUNTER — Encounter: Payer: Self-pay | Admitting: Family Medicine

## 2020-02-19 ENCOUNTER — Ambulatory Visit: Payer: Medicare HMO | Admitting: Physical Therapy

## 2020-02-19 VITALS — BP 122/70 | Ht 61.0 in | Wt 144.4 lb

## 2020-02-19 DIAGNOSIS — I10 Essential (primary) hypertension: Secondary | ICD-10-CM

## 2020-02-19 DIAGNOSIS — M961 Postlaminectomy syndrome, not elsewhere classified: Secondary | ICD-10-CM

## 2020-02-19 DIAGNOSIS — Z794 Long term (current) use of insulin: Secondary | ICD-10-CM

## 2020-02-19 DIAGNOSIS — E1140 Type 2 diabetes mellitus with diabetic neuropathy, unspecified: Secondary | ICD-10-CM

## 2020-02-19 DIAGNOSIS — F339 Major depressive disorder, recurrent, unspecified: Secondary | ICD-10-CM | POA: Diagnosis not present

## 2020-02-19 DIAGNOSIS — R69 Illness, unspecified: Secondary | ICD-10-CM | POA: Diagnosis not present

## 2020-02-19 DIAGNOSIS — M7918 Myalgia, other site: Secondary | ICD-10-CM | POA: Diagnosis not present

## 2020-02-19 LAB — POCT GLYCOSYLATED HEMOGLOBIN (HGB A1C): Hemoglobin A1C: 7.7 % — AB (ref 4.0–5.6)

## 2020-02-19 MED ORDER — DAPAGLIFLOZIN PROPANEDIOL 10 MG PO TABS: 10.0000 mg | ORAL_TABLET | Freq: Every day | ORAL | 5 refills | Status: DC

## 2020-02-19 NOTE — Patient Instructions (Signed)
Please return in 3 months for diabetes follow up  Please call us if the farxiga is expensive: [employer] has financial assistance paperwork that can be completed if needed.   I have ordered a mammogram and/or bone density for you. It is over due: []   Mammogram  [x]   Bone Density  Please call the office checked below to schedule your appointment: Your appointment will at the following location  [x]   [facility]      [place], [place]         []   [facility]  [address] [place], [place] .    If you have any questions or concerns, please don't hesitate to send me a message via MyChart or call the office at [phone]. Thank you for visiting with [place] today! It's our pleasure caring for you.

## 2020-02-19 NOTE — Progress Notes (Signed)
Subjective  CC:  Chief Complaint  Patient presents with  . Diabetes  . Hypertension  . Depression    HPI: Robin Morgan is a 64 y.o. female who presents to the office today for follow up of diabetes and problems listed above in the chief complaint.   Diabetes follow up: Her diabetic control is reported as Unchanged. Increased lantus up to 35mg  bid.  She denies exertional CP or SOB or symptomatic hypoglycemia. She denies foot sores or paresthesias.   Chronic pain remains active. She struggles to get through her days. Dr. Vira Blanco has referred her back to NS.   Depression and pain: now on high dose cymbalta: doing 'OK' on it.   Overdue for f/u dexa for osteopenia.  HTN remains controlled.   Wt Readings from Last 3 Encounters:  02/19/20 144 lb 6.4 oz (65.5 kg)  02/07/20 148 lb (67.1 kg)  01/03/20 146 lb (66.2 kg)    BP Readings from Last 3 Encounters:  02/19/20 122/70  02/07/20 122/73  01/03/20 113/70    Assessment  1. Controlled type 2 diabetes mellitus with diabetic neuropathy, with long-term current use of insulin (HCC)   2. Cervical post-laminectomy syndrome   3. Myofascial muscle pain   4. Depression, recurrent ([place])   5. Essential hypertension      Plan   Diabetes is currently marginally controlled. And worsening. Would like to start farxiga: will see if can afford it or get it through patient assistance program. Continue insulin and metformin  HTN is stable  Depression/pain: counseling done. Also seeing neuropsychiatrist; continue pain mgt and see if NS can be helpful. Seeing PT as well.   HM: recommend scheduling dexa.   Follow up: No follow-ups on file.. Orders Placed This Encounter  Procedures  . POCT HgB A1C   Meds ordered this encounter  Medications  . dapagliflozin propanediol (FARXIGA) 10 MG TABS tablet    Sig: Take 1 tablet (10 mg total) by mouth daily.    Dispense:  30 tablet    Refill:  5      Immunization History  Administered  Date(s) Administered  . Fluad Quad(high Dose 65+) 04/06/2019  . Influenza Split 06/04/2009, 05/02/2012  . Influenza, Quadrivalent, Recombinant, Inj, Pf 05/12/2016, 07/07/2017  . Influenza, Seasonal, Injecte, Preservative Fre 06/06/2014, 04/24/2015  . Influenza,inj,Quad PF,6+ Mos 05/12/2016, 07/07/2017, 05/05/2018  . Influenza-Unspecified 09/03/2011, 04/19/2013  . PFIZER SARS-COV-2 Vaccination 10/03/2019, 10/24/2019  . Pneumococcal Conjugate-13 12/01/2019  . Pneumococcal Polysaccharide-23 08/29/2009, 10/19/2018  . Tdap 05/11/2012    Diabetes Related Lab Review: Lab Results  Component Value Date   HGBA1C 7.7 (A) 02/19/2020   HGBA1C 7.3 (A) 12/01/2019   HGBA1C 7.3 12/01/2019   HGBA1C 7.3 (A) 12/01/2019   HGBA1C 7.3 (A) 12/01/2019    Lab Results  Component Value Date   MICROALBUR <0.7 06/05/2019   Lab Results  Component Value Date   CREATININE 0.95 06/05/2019   BUN 21 06/05/2019   NA 140 06/05/2019   K 4.2 06/05/2019   CL 101 06/05/2019   CO2 28 06/05/2019   Lab Results  Component Value Date   CHOL 140 06/05/2019   CHOL 132 06/16/2018   Lab Results  Component Value Date   HDL 36.50 (L) 06/05/2019   HDL 39.90 06/16/2018   Lab Results  Component Value Date   LDLCALC 73 06/05/2019   LDLCALC 68 06/16/2018   Lab Results  Component Value Date   TRIG 151.0 (H) 06/05/2019   TRIG 120.0 06/16/2018  Lab Results  Component Value Date   CHOLHDL 4 06/05/2019   CHOLHDL 3 06/16/2018   No results found for: LDLDIRECT The 10-year ASCVD risk score Mikey Bussing DC Jr., et al., 2013) is: 13.9%   Values used to calculate the score:     Age: 32 years     Sex: Female     Is Non-Hispanic African American: No     Diabetic: Yes     Tobacco smoker: No     Systolic Blood Pressure: 270 mmHg     Is BP treated: Yes     HDL Cholesterol: 36.5 mg/dL     Total Cholesterol: 140 mg/dL I have reviewed the PMH, Fam and Soc history. Patient Active Problem List   Diagnosis Date Noted  .  Diabetic peripheral neuropathy associated with type 2 diabetes mellitus ([place]) 01/06/2017    Priority: High  . OSA (obstructive sleep apnea) 03/10/2016    Priority: High  . Controlled diabetes mellitus with diabetic neuropathy, with long-term current use of insulin ([place]) 10/11/2014    Priority: High  . HTN (hypertension) 10/11/2014    Priority: High  . Hyperlipidemia 10/11/2014    Priority: High  . DDD (degenerative disc disease), lumbar 01/15/2014    Priority: High  . Hypothyroidism, acquired 06/12/2013    Priority: High  . Cervical post-laminectomy syndrome 05/10/2013    Priority: High  . Chronic pain associated with significant psychosocial dysfunction 01/03/2010    Priority: High    Overview:  Chronic Pain Syndrome, Dr. Earl Lites  Overview:  Overview:  Overview:  Chronic Pain Syndrome, Dr. Earl Lites   . Esophageal stricture     Priority: Medium  . Tenosynovitis, wrist 10/07/2017    Priority: Medium  . Trigger thumb of left hand 10/07/2017    Priority: Medium  . Laryngopharyngeal reflux (LPR) 08/26/2017    Priority: Medium  . Dysphagia 08/26/2017    Priority: Medium  . Left carpal tunnel syndrome 03/08/2017    Priority: Medium  . Fibromyalgia 02/12/2016    Priority: Medium  . Osteopenia 12/01/2012    Priority: Medium    10/2017: osteopenia: T = -1.9; stable. Recheck 2 years T = -1.2 at hip, -1.8 at spine, 4/ 2014   . Chronic diarrhea 07/04/2012    Priority: Medium    Overview:  ? Related to gallbladder surgery. Has been evaluated by Dr. Amedeo Plenty [facility]. Lomotil as needed.   . Biceps tendinitis, right 02/07/2020  . Mid back pain 12/06/2019  . Insomnia due to medical condition 12/01/2019  . Chronic prescription opiate use 09/02/2018  . Myofascial muscle pain 10/28/2011  . Depression, recurrent ([place]) 10/28/2011    Social History: Patient  reports that she has never smoked. She has never used smokeless tobacco. She reports that she does not drink alcohol and does  not use drugs.  Review of Systems: Ophthalmic: negative for eye pain, loss of vision or double vision Cardiovascular: negative for chest pain Respiratory: negative for SOB or persistent cough Gastrointestinal: negative for abdominal pain Genitourinary: negative for dysuria or gross hematuria MSK: negative for foot lesions Neurologic: negative for weakness or gait disturbance  Objective  Vitals: BP 122/70   Ht 5\' 1"  (1.549 m)   Wt 144 lb 6.4 oz (65.5 kg)   BMI 27.28 kg/m  General: well appearing, no acute distress  Psych:  Alert and oriented, normal mood and affect HEENT:  Normocephalic, atraumatic, moist mucous membranes, supple neck  Cardiovascular:  Nl S1 and S2, RRR without murmur, gallop or rub. no  edema Respiratory:  Good breath sounds bilaterally, CTAB with normal effort, no rales  Diabetic education: ongoing education regarding chronic disease management for diabetes was given today. We continue to reinforce the ABC's of diabetic management: A1c (<7 or 8 dependent upon patient), tight blood pressure control, and cholesterol management with goal LDL < 100 minimally. We discuss diet strategies, exercise recommendations, medication options and possible side effects. At each visit, we review recommended immunizations and preventive care recommendations for diabetics and stress that good diabetic control can prevent other problems. See below for this patient's data.    Commons side effects, risks, benefits, and alternatives for medications and treatment plan prescribed today were discussed, and the patient expressed understanding of the given instructions. Patient is instructed to call or message via MyChart if he/she has any questions or concerns regarding our treatment plan. No barriers to understanding were identified. We discussed Red Flag symptoms and signs in detail. Patient expressed understanding regarding what to do in case of urgent or emergency type symptoms.   Medication  list was reconciled, printed and provided to the patient in AVS. Patient instructions and summary information was reviewed with the patient as documented in the AVS. This note was prepared with assistance of Dragon voice recognition software. Occasional wrong-word or sound-a-like substitutions may have occurred due to the inherent limitations of voice recognition software  This visit occurred during the SARS-CoV-2 public health emergency.  Safety protocols were in place, including screening questions prior to the visit, additional usage of staff PPE, and extensive cleaning of exam room while observing appropriate contact time as indicated for disinfecting solutions.

## 2020-02-21 ENCOUNTER — Encounter: Payer: Medicare HMO | Admitting: Physical Therapy

## 2020-02-23 ENCOUNTER — Ambulatory Visit: Payer: Medicare HMO | Admitting: Physical Therapy

## 2020-02-26 ENCOUNTER — Ambulatory Visit: Payer: Medicare HMO | Admitting: Physical Therapy

## 2020-02-28 ENCOUNTER — Encounter: Payer: Self-pay | Admitting: Physical Therapy

## 2020-02-28 ENCOUNTER — Other Ambulatory Visit: Payer: Self-pay

## 2020-02-28 ENCOUNTER — Ambulatory Visit: Payer: Medicare HMO | Admitting: Physical Therapy

## 2020-02-28 DIAGNOSIS — M542 Cervicalgia: Secondary | ICD-10-CM

## 2020-02-28 DIAGNOSIS — R293 Abnormal posture: Secondary | ICD-10-CM | POA: Diagnosis not present

## 2020-02-28 DIAGNOSIS — G8929 Other chronic pain: Secondary | ICD-10-CM | POA: Diagnosis not present

## 2020-02-28 DIAGNOSIS — M7918 Myalgia, other site: Secondary | ICD-10-CM | POA: Diagnosis not present

## 2020-02-28 DIAGNOSIS — M25511 Pain in right shoulder: Secondary | ICD-10-CM

## 2020-02-28 NOTE — Therapy (Signed)
[facility] [address] [place], [address] Phone: [phone]   Fax:  [phone]  Physical Therapy Treatment  Patient Details  Name: Robin Morgan MRN: [id number] Date of Birth: 05-07-1956 No data recorded  Encounter Date: 02/28/2020   PT End of Session - 02/28/20 1349    Visit Number 2    Number of Visits 16    Date for PT Re-Evaluation 04/10/20    PT Start Time 8867    PT Stop Time 1430    PT Time Calculation (min) 45 min    Activity Tolerance Patient tolerated treatment well    Behavior During Therapy [facility] for tasks assessed/performed           Past Medical History:  Diagnosis Date  . Calcific tendonitis   . Cervical spondylosis without myelopathy   . Depression   . Diabetes mellitus   . Esophageal stricture   . Fibromyalgia   . GERD (gastroesophageal reflux disease)   . Hiatal hernia   . Hyperlipidemia   . Hypertension     Past Surgical History:  Procedure Laterality Date  . ABDOMINAL HYSTERECTOMY     1986  . APPENDECTOMY     1983  . BREAST EXCISIONAL BIOPSY Right 1990's  . BREAST SURGERY     rt breast cyst done in the 90's  . CHOLECYSTECTOMY    . GALLBLADDER SURGERY     1983  . ROTATOR CUFF REPAIR     right  . SPINE SURGERY    . TONSILLECTOMY     1973    There were no vitals filed for this visit.   Subjective Assessment - 02/28/20 1347    Subjective Pt reports 7/10 pain in neck. Compliance with HEP.  Pt reports being in bed yesterday and over the weekend d/t a headache that extends into the neck.    Pertinent History Pt is a 64 y.o retired female reporting to PT with chronic neck and R shoulder pain.  Pain is described as sharp shooting pain that begins as a headache-like pain and radiates into the R shoulder and proximal arm (Worst: 9/10, Best and Present: 7/10).  Neck pain has been present for the past 20 years, radiating pain began 5-6 months ago with no reported MOI or  prior hx of trauma.  Pt has hx of cervical laminectomy and R RC surgery in the 90s, and injections in R shoulder (most recent on 02/07/20).  Pain worsens throughout the day and is aggravated by push/pull activities, picking up objects, lifting, and getting in and out of car; eases with rest, ice and pain medication.  Pt reports hx of PT that was unsuccessful and would like to decrease pain with therapy.  PMH of DM and HTN.  Pt denies N/V, B&B changes, unexplained weight fluctuation, saddle paresthesia, fever, night sweats, or unrelenting night pain at this time.    Limitations Sitting;House hold activities;Lifting;Standing    How long can you sit comfortably? Immediate pain    How long can you stand comfortably? 15-20 min    How long can you walk comfortably? Unlimited, unless pain already aggravated by daily activities    Patient Stated Goals Decrease cervical and shoulder pain    Currently in Pain? Yes    Pain Score 7     Pain Location Neck    Pain Onset More than a month ago            Manual Therapy STM  to upper traps, cervical paraspinals, and suboccipitals Suboccipital release 4x30 sec Cervical traction 2x30 sec Cervical pain decreased to 4/10 post MT  TherEx Supine cervical retraction 2x10 with cueing to reduce neck extension Seated cervical retraction x10 with cueing to reduce back extension compensation Scap retractions with YTB 3x10 with cueing to reduce R UT activation and eccentric control    PT Education - 02/28/20 1348    Education Details Therex form/ mechanics    Person(s) Educated Patient    Methods Explanation;Demonstration;Verbal cues    Comprehension Verbalized understanding;Returned demonstration            PT Short Term Goals - 02/14/20 1727      PT SHORT TERM GOAL #1   Title Pt will be independent with HEP in order to improve strength and reduce neck pain in order to improve pain-free function with ADLs.    Baseline 02/14/20 Pt educated on HEP and handout  given    Time 4    Period Weeks    Status New    Target Date 03/13/20             PT Long Term Goals - 02/14/20 1729      PT LONG TERM GOAL #1   Title Pt will increase FOTO score to 45 to demonstrate increased functional mobility with ADLs.    Baseline 02/14/20 FOTO 37    Time 8    Period Weeks    Status New    Target Date 04/10/20      PT LONG TERM GOAL #2   Title Pt will decreased worst neck pain as reported on NPRS by at least 2 points to demonstrate clinically significant reduction in neck pain.    Baseline 02/14/20 Worst: 9/10    Time 8    Period Weeks    Status New    Target Date 04/10/20      PT LONG TERM GOAL #3   Title Pt will increase R shoulder flexion by 1/2 MMT grade in order to demonstrate improvement in strength and function.    Baseline 02/14/20 R shoulder flexion 4/5 and painful    Time 8    Period Weeks    Status New    Target Date 04/10/20      PT LONG TERM GOAL #4   Title Pt will be able to vacuum for 10 minutes without pain to demonstrate improved pain-free functional mobility with ADLs.    Baseline 02/14/20 vacuuming brings on 9/10 pain, unable to perform    Time 8    Period Weeks    Status New    Target Date 04/10/20                 Plan - 02/28/20 1413    Clinical Impression Statement PT focused on cervical mobility and scapular strengthening with good success.  Pt with significant decrease in cervical pain following MT and some decrease in B upper trap tension.  Pt tolerated therex for cervical/scapular motor control with no increase in pain. Pt educated on appropriate posture and its potential impact on cervicogenic headaches.  PT will continue progression as able.    Personal Factors and Comorbidities Age;Comorbidity 1;Comorbidity 2    Comorbidities DM, HTN    Examination-Activity Limitations Carry;Lift;Sit;Stand;Reach Overhead    Examination-Participation Restrictions Cleaning;Laundry;Meal Prep;Driving;Community Activity     Stability/Clinical Decision Making Evolving/Moderate complexity    Clinical Decision Making Moderate    Rehab Potential Fair    PT Frequency 2x / week  PT Duration 8 weeks    PT Treatment/Interventions ADLs/Self Care Home Management;Cryotherapy;Electrical Stimulation;Moist Heat;Traction;Functional mobility training;Therapeutic activities;Therapeutic exercise;Balance training;Neuromuscular re-education;Patient/family education;Manual techniques;Passive range of motion;Energy conservation;Dry needling    PT Next Visit Plan STM to upper traps/occipitals, progress scapulothoracic motor control    PT Home Exercise Plan Scapular and cervial retractions, upper trap stretch    Consulted and Agree with Plan of Care Patient           Patient will benefit from skilled therapeutic intervention in order to improve the following deficits and impairments:  Decreased activity tolerance, Decreased range of motion, Decreased strength, Hypomobility, Impaired UE functional use, Improper body mechanics, Pain, Postural dysfunction, Impaired flexibility, Increased muscle spasms, Decreased mobility, Increased fascial restricitons  Visit Diagnosis: Chronic cervical pain  Chronic right shoulder pain  Abnormal posture  Myofascial pain syndrome of thoracic spine     Problem List Patient Active Problem List   Diagnosis Date Noted  . Biceps tendinitis, right 02/07/2020  . Mid back pain 12/06/2019  . Insomnia due to medical condition 12/01/2019  . Chronic prescription opiate use 09/02/2018  . Esophageal stricture   . Tenosynovitis, wrist 10/07/2017  . Trigger thumb of left hand 10/07/2017  . Laryngopharyngeal reflux (LPR) 08/26/2017  . Dysphagia 08/26/2017  . Left carpal tunnel syndrome 03/08/2017  . Diabetic peripheral neuropathy associated with type 2 diabetes mellitus ([place]) 01/06/2017  . OSA (obstructive sleep apnea) 03/10/2016  . Fibromyalgia 02/12/2016  . Controlled diabetes mellitus with  diabetic neuropathy, with long-term current use of insulin ([place]) 10/11/2014  . HTN (hypertension) 10/11/2014  . Hyperlipidemia 10/11/2014  . DDD (degenerative disc disease), lumbar 01/15/2014  . Hypothyroidism, acquired 06/12/2013  . Cervical post-laminectomy syndrome 05/10/2013  . Osteopenia 12/01/2012  . Chronic diarrhea 07/04/2012  . Myofascial muscle pain 10/28/2011  . Depression, recurrent ([place]) 10/28/2011  . Chronic pain associated with significant psychosocial dysfunction 01/03/2010    Durwin Reges DPT Chinita Greenland, SPT Durwin Reges 02/28/2020, 2:37 PM  [facility] [address] [place], [address] Phone: [phone]   Fax:  [phone]  Name: Robin Morgan MRN: [id number] Date of Birth: 02/10/1956

## 2020-02-29 ENCOUNTER — Other Ambulatory Visit: Payer: Self-pay | Admitting: Family Medicine

## 2020-03-01 ENCOUNTER — Other Ambulatory Visit: Payer: Self-pay | Admitting: Family Medicine

## 2020-03-04 ENCOUNTER — Ambulatory Visit: Payer: Medicare HMO | Admitting: Physical Therapy

## 2020-03-06 ENCOUNTER — Encounter: Payer: Self-pay | Admitting: Physical Therapy

## 2020-03-06 ENCOUNTER — Ambulatory Visit: Payer: Medicare HMO | Admitting: Physical Therapy

## 2020-03-06 ENCOUNTER — Other Ambulatory Visit: Payer: Self-pay

## 2020-03-06 DIAGNOSIS — M542 Cervicalgia: Secondary | ICD-10-CM | POA: Diagnosis not present

## 2020-03-06 DIAGNOSIS — M7918 Myalgia, other site: Secondary | ICD-10-CM | POA: Diagnosis not present

## 2020-03-06 DIAGNOSIS — G8929 Other chronic pain: Secondary | ICD-10-CM

## 2020-03-06 DIAGNOSIS — R293 Abnormal posture: Secondary | ICD-10-CM

## 2020-03-06 DIAGNOSIS — M25511 Pain in right shoulder: Secondary | ICD-10-CM | POA: Diagnosis not present

## 2020-03-06 NOTE — Therapy (Signed)
[facility] [address], [place], [address] Phone: [phone]   Fax:  [phone]  Physical Therapy Treatment  Patient Details  Name: Robin Morgan MRN: [id number] Date of Birth: 1956-06-17 No data recorded  Encounter Date: 03/06/2020   PT End of Session - 03/06/20 1343    Visit Number 3    Number of Visits 16    Date for PT Re-Evaluation 04/10/20    PT Start Time 3557    PT Stop Time 1425    PT Time Calculation (min) 40 min    Activity Tolerance Patient tolerated treatment well    Behavior During Therapy [facility] for tasks assessed/performed           Past Medical History:  Diagnosis Date  . Calcific tendonitis   . Cervical spondylosis without myelopathy   . Depression   . Diabetes mellitus   . Esophageal stricture   . Fibromyalgia   . GERD (gastroesophageal reflux disease)   . Hiatal hernia   . Hyperlipidemia   . Hypertension     Past Surgical History:  Procedure Laterality Date  . ABDOMINAL HYSTERECTOMY     1986  . APPENDECTOMY     1983  . BREAST EXCISIONAL BIOPSY Right 1990's  . BREAST SURGERY     rt breast cyst done in the 90's  . CHOLECYSTECTOMY    . GALLBLADDER SURGERY     1983  . ROTATOR CUFF REPAIR     right  . SPINE SURGERY    . TONSILLECTOMY     1973    There were no vitals filed for this visit.   Subjective Assessment - 03/06/20 1347    Subjective Pt reports 6/10 pain in neck on R side. Pt reports she is fighting off a headache currently; headache was on L side of neck yesterday and day before.  Pt reports the day following headaches is difficult and that she avoids certain movements that may trigger the headache to return (ie. cervical rotation).  Compliance with HEP.    Pertinent History Pt is a 64 y.o retired female reporting to PT with chronic neck and R shoulder pain.  Pain is described as sharp shooting pain that begins as a headache-like pain and radiates into the  R shoulder and proximal arm (Worst: 9/10, Best and Present: 7/10).  Neck pain has been present for the past 20 years, radiating pain began 5-6 months ago with no reported MOI or prior hx of trauma.  Pt has hx of cervical laminectomy and R RC surgery in the 90s, and injections in R shoulder (most recent on 02/07/20).  Pain worsens throughout the day and is aggravated by push/pull activities, picking up objects, lifting, and getting in and out of car; eases with rest, ice and pain medication.  Pt reports hx of PT that was unsuccessful and would like to decrease pain with therapy.  PMH of DM and HTN.  Pt denies N/V, B&B changes, unexplained weight fluctuation, saddle paresthesia, fever, night sweats, or unrelenting night pain at this time.    Limitations Sitting;House hold activities;Lifting;Standing    How long can you sit comfortably? Immediate pain    How long can you stand comfortably? 15-20 min    How long can you walk comfortably? Unlimited, unless pain already aggravated by daily activities    Patient Stated Goals Decrease cervical and shoulder pain    Currently in Pain? Yes    Pain Score 6  Pain Location Neck    Pain Orientation Right    Pain Onset More than a month ago          Manual Therapy Supine STM to upper traps, cervical paraspinals, and suboccipitals for several minutes  Suboccipital release 4x30 sec Prone STM to UT with multiple TrP noted in L UT with several minutes of TrP release STM Cervical pain decreased to 4/10 post MT  TherEx Prone T 2x8 with cueing for scapular retraction and depression B knee trunk rotation x10 to reduce catch in L thoracic area Seated cervical retraction 2x10 with cueing to reduce back extension compensation Scap retractions with YTB 2x10 with cueing to reduce R UT activation and maintain scap retraction and depression with eccentric control Doorway pec stretch x30 sec     PT Education - 03/06/20 1343    Education Details Therex form/  mechanics    Person(s) Educated Patient    Methods Explanation;Demonstration;Verbal cues    Comprehension Verbalized understanding;Returned demonstration            PT Short Term Goals - 02/14/20 1727      PT SHORT TERM GOAL #1   Title Pt will be independent with HEP in order to improve strength and reduce neck pain in order to improve pain-free function with ADLs.    Baseline 02/14/20 Pt educated on HEP and handout given    Time 4    Period Weeks    Status New    Target Date 03/13/20             PT Long Term Goals - 02/14/20 1729      PT LONG TERM GOAL #1   Title Pt will increase FOTO score to 45 to demonstrate increased functional mobility with ADLs.    Baseline 02/14/20 FOTO 37    Time 8    Period Weeks    Status New    Target Date 04/10/20      PT LONG TERM GOAL #2   Title Pt will decreased worst neck pain as reported on NPRS by at least 2 points to demonstrate clinically significant reduction in neck pain.    Baseline 02/14/20 Worst: 9/10    Time 8    Period Weeks    Status New    Target Date 04/10/20      PT LONG TERM GOAL #3   Title Pt will increase R shoulder flexion by 1/2 MMT grade in order to demonstrate improvement in strength and function.    Baseline 02/14/20 R shoulder flexion 4/5 and painful    Time 8    Period Weeks    Status New    Target Date 04/10/20      PT LONG TERM GOAL #4   Title Pt will be able to vacuum for 10 minutes without pain to demonstrate improved pain-free functional mobility with ADLs.    Baseline 02/14/20 vacuuming brings on 9/10 pain, unable to perform    Time 8    Period Weeks    Status New    Target Date 04/10/20                 Plan - 03/06/20 1349    Clinical Impression Statement PT focused on reducing R UT and suboccipital tension to reduce R sided cervicogenic headache with good success.  Pt reports clinically significant reduction of tension and headache pain to 4/10 post MT.  Pt requires multimodal cueing to reduce  UT activation compensation during cervical and scapulothoracic therex,  needs further instruction.  PT will continue progression as able.    Personal Factors and Comorbidities Age;Comorbidity 1;Comorbidity 2    Comorbidities DM, HTN    Examination-Activity Limitations Carry;Lift;Sit;Stand;Reach Overhead    Examination-Participation Restrictions Cleaning;Laundry;Meal Prep;Driving;Community Activity    Stability/Clinical Decision Making Evolving/Moderate complexity    Clinical Decision Making Moderate    Rehab Potential Fair    PT Frequency 2x / week    PT Duration 8 weeks    PT Treatment/Interventions ADLs/Self Care Home Management;Cryotherapy;Electrical Stimulation;Moist Heat;Traction;Functional mobility training;Therapeutic activities;Therapeutic exercise;Balance training;Neuromuscular re-education;Patient/family education;Manual techniques;Passive range of motion;Energy conservation;Dry needling    PT Next Visit Plan STM to upper traps/occipitals, progress scapulothoracic motor control    PT Home Exercise Plan Scapular and cervial retractions, upper trap stretch    Consulted and Agree with Plan of Care Patient           Patient will benefit from skilled therapeutic intervention in order to improve the following deficits and impairments:  Decreased activity tolerance, Decreased range of motion, Decreased strength, Hypomobility, Impaired UE functional use, Improper body mechanics, Pain, Postural dysfunction, Impaired flexibility, Increased muscle spasms, Decreased mobility, Increased fascial restricitons  Visit Diagnosis: Chronic cervical pain  Chronic right shoulder pain  Abnormal posture  Myofascial pain syndrome of thoracic spine     Problem List Patient Active Problem List   Diagnosis Date Noted  . Biceps tendinitis, right 02/07/2020  . Mid back pain 12/06/2019  . Insomnia due to medical condition 12/01/2019  . Chronic prescription opiate use 09/02/2018  . Esophageal  stricture   . Tenosynovitis, wrist 10/07/2017  . Trigger thumb of left hand 10/07/2017  . Laryngopharyngeal reflux (LPR) 08/26/2017  . Dysphagia 08/26/2017  . Left carpal tunnel syndrome 03/08/2017  . Diabetic peripheral neuropathy associated with type 2 diabetes mellitus ([place]) 01/06/2017  . OSA (obstructive sleep apnea) 03/10/2016  . Fibromyalgia 02/12/2016  . Controlled diabetes mellitus with diabetic neuropathy, with long-term current use of insulin ([place]) 10/11/2014  . HTN (hypertension) 10/11/2014  . Hyperlipidemia 10/11/2014  . DDD (degenerative disc disease), lumbar 01/15/2014  . Hypothyroidism, acquired 06/12/2013  . Cervical post-laminectomy syndrome 05/10/2013  . Osteopenia 12/01/2012  . Chronic diarrhea 07/04/2012  . Myofascial muscle pain 10/28/2011  . Depression, recurrent ([place]) 10/28/2011  . Chronic pain associated with significant psychosocial dysfunction 01/03/2010    Durwin Reges DPT Chinita Greenland, SPT Durwin Reges 03/06/2020, 3:26 PM  [facility] [address] [place], [address] Phone: [phone]   Fax:  [phone]  Name: Robin Morgan MRN: [id number] Date of Birth: [birth year]

## 2020-03-11 ENCOUNTER — Ambulatory Visit: Payer: Medicare HMO | Admitting: Physical Therapy

## 2020-03-13 ENCOUNTER — Other Ambulatory Visit: Payer: Self-pay

## 2020-03-13 ENCOUNTER — Encounter: Payer: Self-pay | Admitting: Physical Therapy

## 2020-03-13 ENCOUNTER — Ambulatory Visit: Payer: Medicare HMO | Attending: Physical Medicine & Rehabilitation | Admitting: Physical Therapy

## 2020-03-13 DIAGNOSIS — G8929 Other chronic pain: Secondary | ICD-10-CM | POA: Insufficient documentation

## 2020-03-13 DIAGNOSIS — M542 Cervicalgia: Secondary | ICD-10-CM | POA: Diagnosis not present

## 2020-03-13 DIAGNOSIS — M7918 Myalgia, other site: Secondary | ICD-10-CM | POA: Diagnosis not present

## 2020-03-13 DIAGNOSIS — M25511 Pain in right shoulder: Secondary | ICD-10-CM | POA: Insufficient documentation

## 2020-03-13 DIAGNOSIS — R293 Abnormal posture: Secondary | ICD-10-CM | POA: Insufficient documentation

## 2020-03-13 NOTE — Therapy (Signed)
[facility] [address], [place], [address] Phone: [phone]   Fax:  [phone]  Physical Therapy Treatment  Patient Details  Name: Robin Morgan MRN: [id number] Date of Birth: 03/18/1956 No data recorded  Encounter Date: 03/13/2020   PT End of Session - 03/13/20 1445    Visit Number 4    Number of Visits 16    Date for PT Re-Evaluation 04/10/20    PT Start Time 0230    PT Stop Time 0310    PT Time Calculation (min) 40 min    Activity Tolerance Patient tolerated treatment well    Behavior During Therapy [facility] for tasks assessed/performed           Past Medical History:  Diagnosis Date  . Calcific tendonitis   . Cervical spondylosis without myelopathy   . Depression   . Diabetes mellitus   . Esophageal stricture   . Fibromyalgia   . GERD (gastroesophageal reflux disease)   . Hiatal hernia   . Hyperlipidemia   . Hypertension     Past Surgical History:  Procedure Laterality Date  . ABDOMINAL HYSTERECTOMY     1986  . APPENDECTOMY     1983  . BREAST EXCISIONAL BIOPSY Right 1990's  . BREAST SURGERY     rt breast cyst done in the 90's  . CHOLECYSTECTOMY    . GALLBLADDER SURGERY     1983  . ROTATOR CUFF REPAIR     right  . SPINE SURGERY    . TONSILLECTOMY     1973    There were no vitals filed for this visit.   Subjective Assessment - 03/13/20 1428    Subjective Patient reports that she is having R shoulder pain that "catches" when she reaches behind her or when she wakes up in the morning. She localizes this pain to bicep muscle belly and proximal insertion. Reports she is cotinuing to get L sided headache, but that her pain is feeling pretty good today, reporting 4/10 pain    Pertinent History Pt is a 64 y.o retired female reporting to PT with chronic neck and R shoulder pain.  Pain is described as sharp shooting pain that begins as a headache-like pain and radiates into the R  shoulder and proximal arm (Worst: 9/10, Best and Present: 7/10).  Neck pain has been present for the past 20 years, radiating pain began 5-6 months ago with no reported MOI or prior hx of trauma.  Pt has hx of cervical laminectomy and R RC surgery in the 90s, and injections in R shoulder (most recent on 02/07/20).  Pain worsens throughout the day and is aggravated by push/pull activities, picking up objects, lifting, and getting in and out of car; eases with rest, ice and pain medication.  Pt reports hx of PT that was unsuccessful and would like to decrease pain with therapy.  PMH of DM and HTN.  Pt denies N/V, B&B changes, unexplained weight fluctuation, saddle paresthesia, fever, night sweats, or unrelenting night pain at this time.    Limitations Sitting;House hold activities;Lifting;Standing    How long can you sit comfortably? Immediate pain    How long can you stand comfortably? 15-20 min    How long can you walk comfortably? Unlimited, unless pain already aggravated by daily activities    Patient Stated Goals Decrease cervical and shoulder pain           Manual Therapy Supine STM to upper  traps, cervical paraspinals, and suboccipitals for several minutes  Suboccipital release 4x30 sec CTJ-T2 CPA post grade II mob 30sec bouts 6 bouts for increased thoracic mobility   TherEx Prone Y 2x8 with heavy cuing to prevent shoulder hiking with decent carry over Prone T 2x8 with cueing for scapular retraction and depression Open book x12 each direction with cuing for breath control with this with decent carry over Diaphragmatic breathing 59mins with heavy cuing initially for technique with hand on belly and chest. Education on breathing without excessive tension of accessory muscles Cat/camel x12 with heavy cuing for proper technique and breath control with rib cage expansion; good carry over Seated with 1/2 foam thoracic ext with hands behind head x12 with cuing for breath control Seated rows YTB 2x  10 with maintained cervical retraction with cuing to prevent shoulder hiking and breath holding with decent carry over                           PT Education - 03/13/20 1445    Education Details therex form/technique, breath control    Person(s) Educated Patient    Methods Explanation;Demonstration;Verbal cues    Comprehension Verbalized understanding;Returned demonstration;Verbal cues required            PT Short Term Goals - 02/14/20 1727      PT SHORT TERM GOAL #1   Title Pt will be independent with HEP in order to improve strength and reduce neck pain in order to improve pain-free function with ADLs.    Baseline 02/14/20 Pt educated on HEP and handout given    Time 4    Period Weeks    Status New    Target Date 03/13/20             PT Long Term Goals - 02/14/20 1729      PT LONG TERM GOAL #1   Title Pt will increase FOTO score to 45 to demonstrate increased functional mobility with ADLs.    Baseline 02/14/20 FOTO 37    Time 8    Period Weeks    Status New    Target Date 04/10/20      PT LONG TERM GOAL #2   Title Pt will decreased worst neck pain as reported on NPRS by at least 2 points to demonstrate clinically significant reduction in neck pain.    Baseline 02/14/20 Worst: 9/10    Time 8    Period Weeks    Status New    Target Date 04/10/20      PT LONG TERM GOAL #3   Title Pt will increase R shoulder flexion by 1/2 MMT grade in order to demonstrate improvement in strength and function.    Baseline 02/14/20 R shoulder flexion 4/5 and painful    Time 8    Period Weeks    Status New    Target Date 04/10/20      PT LONG TERM GOAL #4   Title Pt will be able to vacuum for 10 minutes without pain to demonstrate improved pain-free functional mobility with ADLs.    Baseline 02/14/20 vacuuming brings on 9/10 pain, unable to perform    Time 8    Period Weeks    Status New    Target Date 04/10/20                 Plan - 03/13/20 1449     Clinical Impression Statement Patient reports "catching" sensation, that seems  to be proximal bicep tendon d/t subacromial impingement. Patient does report some pain at CTJ as well. Following MT patient reports decreased pain. PT continued graded exposure to therex for postural correction with patient able to comply with all cuing for technique/posture with good motivation throughout session. Patient responds well to education on breath control and thoracic mobility impact on subacromial impingement. PT will continue progression as able.    Personal Factors and Comorbidities Age;Comorbidity 1;Comorbidity 2    Comorbidities DM, HTN    Examination-Activity Limitations Carry;Lift;Sit;Stand;Reach Overhead    Examination-Participation Restrictions Cleaning;Laundry;Meal Prep;Driving;Community Activity    Stability/Clinical Decision Making Evolving/Moderate complexity    Clinical Decision Making Moderate    Rehab Potential Fair    PT Frequency 2x / week    PT Duration 8 weeks    PT Treatment/Interventions ADLs/Self Care Home Management;Cryotherapy;Electrical Stimulation;Moist Heat;Traction;Functional mobility training;Therapeutic activities;Therapeutic exercise;Balance training;Neuromuscular re-education;Patient/family education;Manual techniques;Passive range of motion;Energy conservation;Dry needling    PT Next Visit Plan STM to upper traps/occipitals, progress scapulothoracic motor control    PT Home Exercise Plan Scapular and cervial retractions, upper trap stretch    Consulted and Agree with Plan of Care Patient           Patient will benefit from skilled therapeutic intervention in order to improve the following deficits and impairments:  Decreased activity tolerance, Decreased range of motion, Decreased strength, Hypomobility, Impaired UE functional use, Improper body mechanics, Pain, Postural dysfunction, Impaired flexibility, Increased muscle spasms, Decreased mobility, Increased fascial  restricitons  Visit Diagnosis: Chronic cervical pain  Chronic right shoulder pain  Abnormal posture  Myofascial pain syndrome of thoracic spine     Problem List Patient Active Problem List   Diagnosis Date Noted  . Biceps tendinitis, right 02/07/2020  . Mid back pain 12/06/2019  . Insomnia due to medical condition 12/01/2019  . Chronic prescription opiate use 09/02/2018  . Esophageal stricture   . Tenosynovitis, wrist 10/07/2017  . Trigger thumb of left hand 10/07/2017  . Laryngopharyngeal reflux (LPR) 08/26/2017  . Dysphagia 08/26/2017  . Left carpal tunnel syndrome 03/08/2017  . Diabetic peripheral neuropathy associated with type 2 diabetes mellitus ([place]) 01/06/2017  . OSA (obstructive sleep apnea) 03/10/2016  . Fibromyalgia 02/12/2016  . Controlled diabetes mellitus with diabetic neuropathy, with long-term current use of insulin ([place]) 10/11/2014  . HTN (hypertension) 10/11/2014  . Hyperlipidemia 10/11/2014  . DDD (degenerative disc disease), lumbar 01/15/2014  . Hypothyroidism, acquired 06/12/2013  . Cervical post-laminectomy syndrome 05/10/2013  . Osteopenia 12/01/2012  . Chronic diarrhea 07/04/2012  . Myofascial muscle pain 10/28/2011  . Depression, recurrent ([place]) 10/28/2011  . Chronic pain associated with significant psychosocial dysfunction 01/03/2010   Durwin Reges DPT Durwin Reges 03/13/2020, 3:17 PM  [facility] [address] [place], [address] Phone: [phone]   Fax:  [phone]  Name: KASSAUNDRA HAIR MRN: [id number] Date of Birth: 25-Nov-1955

## 2020-03-19 ENCOUNTER — Ambulatory Visit: Payer: Medicare HMO | Admitting: Physical Therapy

## 2020-03-19 DIAGNOSIS — M7918 Myalgia, other site: Secondary | ICD-10-CM

## 2020-03-19 DIAGNOSIS — M797 Fibromyalgia: Secondary | ICD-10-CM

## 2020-03-20 ENCOUNTER — Ambulatory Visit: Payer: Medicare HMO | Admitting: Physical Therapy

## 2020-03-20 ENCOUNTER — Other Ambulatory Visit: Payer: Self-pay

## 2020-03-20 ENCOUNTER — Encounter: Payer: Self-pay | Admitting: Physical Therapy

## 2020-03-20 DIAGNOSIS — G8929 Other chronic pain: Secondary | ICD-10-CM

## 2020-03-20 DIAGNOSIS — M25511 Pain in right shoulder: Secondary | ICD-10-CM | POA: Diagnosis not present

## 2020-03-20 DIAGNOSIS — M7918 Myalgia, other site: Secondary | ICD-10-CM

## 2020-03-20 DIAGNOSIS — M542 Cervicalgia: Secondary | ICD-10-CM | POA: Diagnosis not present

## 2020-03-20 DIAGNOSIS — R293 Abnormal posture: Secondary | ICD-10-CM

## 2020-03-20 MED ORDER — MORPHINE SULFATE 15 MG PO TABS: 15.0000 mg | ORAL_TABLET | Freq: Three times a day (TID) | ORAL | 0 refills | Status: DC | PRN

## 2020-03-20 NOTE — Telephone Encounter (Signed)
MS IR rx sent to pharmacy

## 2020-03-20 NOTE — Therapy (Addendum)
[facility] [address] [place], [address] Phone: [phone]   Fax:  [phone]  Physical Therapy Treatment and Discharge Note  Patient Details  Name: Robin Morgan MRN: [id number] Date of Birth: 12/31/1955 No data recorded  PHYSICAL THERAPY DISCHARGE SUMMARY  Visits from Start of Care: 5  Current functional level related to goals / functional outcomes: Unable to assess due to unplanned discharge    Remaining deficits: Unable to assess due to unplanned discharge    Education / Equipment: Unable to assess due to unplanned discharge    Patient agrees to discharge. Patient goals were not met. Patient is being discharged due to not returning since the last visit.  Signing therapist has never treated or seen patient, but is discharging patient on behalf of last treating therapist secondary to long standing open episode that needs closing.  12:38 PM, 08/19/21 Jerene Pitch, DPT Physical Therapy with [facility]  [phone] office   Encounter Date: 03/20/2020   PT End of Session - 03/20/20 1410     Visit Number 5    Number of Visits 16    Date for PT Re-Evaluation 04/10/20    PT Start Time 0145    PT Stop Time 0225    PT Time Calculation (min) 40 min    Activity Tolerance Patient tolerated treatment well    Behavior During Therapy WFL for tasks assessed/performed             Past Medical History:  Diagnosis Date   Calcific tendonitis    Cervical spondylosis without myelopathy    Depression    Diabetes mellitus    Esophageal stricture    Fibromyalgia    GERD (gastroesophageal reflux disease)    Hiatal hernia    Hyperlipidemia    Hypertension     Past Surgical History:  Procedure Laterality Date   ABDOMINAL HYSTERECTOMY     1986   APPENDECTOMY     1983   BREAST EXCISIONAL BIOPSY Right 1990's   BREAST SURGERY     rt breast cyst done in the [place]     right   [place]    There were no vitals filed for this visit.   Subjective Assessment - 03/20/20 1350     Subjective Patinet was unable to attend PT earlier this week d/t headache. Reports she feels more stiff d/t this, mostly on the RUE/R side of the neck. She reports the RUE hurts d/t shoulder pain that she has had injections for. Reports R sided neck pain is 7/10 today    Pertinent History Pt is a 64 y.o retired female reporting to PT with chronic neck and R shoulder pain.  Pain is described as sharp shooting pain that begins as a headache-like pain and radiates into the R shoulder and proximal arm (Worst: 9/10, Best and Present: 7/10).  Neck pain has been present for the past 20 years, radiating pain began 5-6 months ago with no reported MOI or prior hx of trauma.  Pt has hx of cervical laminectomy and R RC surgery in the 90s, and injections in R shoulder (most recent on 02/07/20).  Pain worsens throughout the day and is aggravated by push/pull activities, picking up objects, lifting, and getting in and out of car; eases  with rest, ice and pain medication.  Pt reports hx of PT that was unsuccessful and would like to decrease pain with therapy.  PMH of DM and HTN.  Pt denies N/V, B&B changes, unexplained weight fluctuation, saddle paresthesia, fever, night sweats, or unrelenting night pain at this time.    Limitations Sitting;House hold activities;Lifting;Standing    How long can you sit comfortably? Immediate pain    How long can you stand comfortably? 15-20 min    How long can you walk comfortably? Unlimited, unless pain already aggravated by daily activities    Patient Stated Goals Decrease cervical and shoulder pain    Pain Onset More than a month ago                Manual Therapy Supine STM to upper traps, cervical paraspinals, and suboccipitals for several minutes   Suboccipital release 4x30 sec CTJ-T2 CPA post grade II mob 30sec bouts 6 bouts for increased thoracic mobility  Following: Dry Needling: (2/2/2/2) 7m .25 needles placed along the bilat UT and levator scapulae to decrease increased muscular spasms and trigger points with the patient positioned in prone with pincer grasp. Patient was educated on risks and benefits of therapy and verbally consents to PT.      TherEx Seated cervical retraction x15 3sec hold; demo and TC initially for proper position with good carry over Supine chin tuck + lift x10 10sec hold with TC initially with good carry over folloiwng Y qped on theraball 3x 8 with cuing for set up, and TC throughout for cervical neutral with good carry over Diaphragmatic breathing 243ms with heavy cuing initially for technique with hand on belly and chest. Education on breathing without excessive tension of accessory muscles                         PT Education - 03/20/20 1410     Education Details posture, therex techniqueform    Person(s) Educated Patient    Methods Explanation;Tactile cues;Demonstration    Comprehension Verbalized understanding;Verbal cues required;Returned demonstration              PT Short Term Goals - 02/14/20 1727       PT SHORT TERM GOAL #1   Title Pt will be independent with HEP in order to improve strength and reduce neck pain in order to improve pain-free function with ADLs.    Baseline 02/14/20 Pt educated on HEP and handout given    Time 4    Period Weeks    Status New    Target Date 03/13/20               PT Long Term Goals - 02/14/20 1729       PT LONG TERM GOAL #1   Title Pt will increase FOTO score to 45 to demonstrate increased functional mobility with ADLs.    Baseline 02/14/20 FOTO 37    Time 8    Period Weeks    Status New    Target Date 04/10/20      PT LONG TERM GOAL #2   Title Pt will decreased worst neck pain as reported on NPRS by at least 2  points to demonstrate clinically significant reduction in neck pain.    Baseline 02/14/20 Worst: 9/10    Time 8    Period Weeks    Status New    Target Date 04/10/20      PT LONG TERM GOAL #3  Title Pt will increase R shoulder flexion by 1/2 MMT grade in order to demonstrate improvement in strength and function.    Baseline 02/14/20 R shoulder flexion 4/5 and painful    Time 8    Period Weeks    Status New    Target Date 04/10/20      PT LONG TERM GOAL #4   Title Pt will be able to vacuum for 10 minutes without pain to demonstrate improved pain-free functional mobility with ADLs.    Baseline 02/14/20 vacuuming brings on 9/10 pain, unable to perform    Time 8    Period Weeks    Status New    Target Date 04/10/20                   Plan - 03/20/20 1419     Clinical Impression Statement Pt with increased muscular tension this session, with increased time needed to resolve. Patient responds well to manual with TDN to reduce pain and muscle tension to allow for netural cervical posutre, reports decreased pain following. Patient is able to comply with cuing for therex progression toward neutral posture to reduce tension/tension HA, and restore proper length tension relationships of cervical and thoracic spine with good success. PT will continue progression as able.    Personal Factors and Comorbidities Age;Comorbidity 1;Comorbidity 2    Comorbidities DM, HTN    Examination-Activity Limitations Carry;Lift;Sit;Stand;Reach Overhead    Examination-Participation Restrictions Cleaning;Laundry;Meal Prep;Driving;Community Activity    Stability/Clinical Decision Making Evolving/Moderate complexity    Clinical Decision Making Moderate    Rehab Potential Fair    PT Frequency 2x / week    PT Duration 8 weeks    PT Treatment/Interventions ADLs/Self Care Home Management;Cryotherapy;Electrical Stimulation;Moist Heat;Traction;Functional mobility training;Therapeutic activities;Therapeutic  exercise;Balance training;Neuromuscular re-education;Patient/family education;Manual techniques;Passive range of motion;Energy conservation;Dry needling    PT Next Visit Plan STM to upper traps/occipitals, progress scapulothoracic motor control    PT Home Exercise Plan Scapular and cervial retractions, upper trap stretch    Consulted and Agree with Plan of Care Patient             Patient will benefit from skilled therapeutic intervention in order to improve the following deficits and impairments:  Decreased activity tolerance, Decreased range of motion, Decreased strength, Hypomobility, Impaired UE functional use, Improper body mechanics, Pain, Postural dysfunction, Impaired flexibility, Increased muscle spasms, Decreased mobility, Increased fascial restricitons  Visit Diagnosis: Chronic cervical pain  Chronic right shoulder pain  Abnormal posture  Myofascial pain syndrome of thoracic spine     Problem List Patient Active Problem List   Diagnosis Date Noted   Biceps tendinitis, right 02/07/2020   Mid back pain 12/06/2019   Insomnia due to medical condition 12/01/2019   Chronic prescription opiate use 09/02/2018   Esophageal stricture    Tenosynovitis, wrist 10/07/2017   Trigger thumb of left hand 10/07/2017   Laryngopharyngeal reflux (LPR) 08/26/2017   Dysphagia 08/26/2017   Left carpal tunnel syndrome 03/08/2017   Diabetic peripheral neuropathy associated with type 2 diabetes mellitus ([place]) 01/06/2017   OSA (obstructive sleep apnea) 03/10/2016   Fibromyalgia 02/12/2016   Controlled diabetes mellitus with diabetic neuropathy, with long-term current use of insulin ([place]) 10/11/2014   HTN (hypertension) 10/11/2014   Hyperlipidemia 10/11/2014   DDD (degenerative disc disease), lumbar 01/15/2014   Hypothyroidism, acquired 06/12/2013   Cervical post-laminectomy syndrome 05/10/2013   Osteopenia 12/01/2012   Chronic diarrhea 07/04/2012   Myofascial muscle pain 10/28/2011    Depression, recurrent ([place]) 10/28/2011  Chronic pain associated with significant psychosocial dysfunction 01/03/2010   Durwin Reges DPT  Durwin Reges 03/20/2020, 2:22 PM  [facility] [address], [place], [address] Phone: [phone]   Fax:  [phone]  Name: Robin Morgan MRN: [id number] Date of Birth: February 10, 1956

## 2020-03-26 ENCOUNTER — Ambulatory Visit: Payer: Medicare HMO | Admitting: Physical Therapy

## 2020-03-27 ENCOUNTER — Ambulatory Visit: Payer: Medicare HMO | Admitting: Physical Therapy

## 2020-04-01 ENCOUNTER — Encounter: Payer: Medicare HMO | Admitting: Psychology

## 2020-04-02 ENCOUNTER — Ambulatory Visit: Payer: Medicare HMO | Admitting: Physical Therapy

## 2020-04-03 ENCOUNTER — Encounter: Payer: Medicare HMO | Admitting: Physical Therapy

## 2020-04-03 ENCOUNTER — Encounter: Payer: Self-pay | Admitting: Physical Medicine & Rehabilitation

## 2020-04-03 ENCOUNTER — Encounter: Payer: Medicare HMO | Attending: Physical Medicine and Rehabilitation | Admitting: Physical Medicine & Rehabilitation

## 2020-04-03 ENCOUNTER — Other Ambulatory Visit: Payer: Self-pay

## 2020-04-03 VITALS — BP 116/54 | HR 105 | Temp 98.6°F | Ht 61.0 in | Wt 143.0 lb

## 2020-04-03 DIAGNOSIS — E1142 Type 2 diabetes mellitus with diabetic polyneuropathy: Secondary | ICD-10-CM | POA: Insufficient documentation

## 2020-04-03 DIAGNOSIS — Z5181 Encounter for therapeutic drug level monitoring: Secondary | ICD-10-CM | POA: Diagnosis not present

## 2020-04-03 DIAGNOSIS — F339 Major depressive disorder, recurrent, unspecified: Secondary | ICD-10-CM | POA: Diagnosis present

## 2020-04-03 DIAGNOSIS — G894 Chronic pain syndrome: Secondary | ICD-10-CM | POA: Diagnosis not present

## 2020-04-03 DIAGNOSIS — Z79891 Long term (current) use of opiate analgesic: Secondary | ICD-10-CM | POA: Diagnosis not present

## 2020-04-03 DIAGNOSIS — M7918 Myalgia, other site: Secondary | ICD-10-CM | POA: Diagnosis not present

## 2020-04-03 DIAGNOSIS — M7521 Bicipital tendinitis, right shoulder: Secondary | ICD-10-CM | POA: Diagnosis not present

## 2020-04-03 DIAGNOSIS — M961 Postlaminectomy syndrome, not elsewhere classified: Secondary | ICD-10-CM | POA: Insufficient documentation

## 2020-04-03 DIAGNOSIS — R69 Illness, unspecified: Secondary | ICD-10-CM | POA: Diagnosis not present

## 2020-04-03 DIAGNOSIS — M549 Dorsalgia, unspecified: Secondary | ICD-10-CM | POA: Diagnosis not present

## 2020-04-03 DIAGNOSIS — M797 Fibromyalgia: Secondary | ICD-10-CM | POA: Diagnosis not present

## 2020-04-03 DIAGNOSIS — M609 Myositis, unspecified: Secondary | ICD-10-CM | POA: Diagnosis present

## 2020-04-03 DIAGNOSIS — M25511 Pain in right shoulder: Secondary | ICD-10-CM | POA: Diagnosis not present

## 2020-04-03 MED ORDER — MORPHINE SULFATE 15 MG PO TABS: 15.0000 mg | ORAL_TABLET | Freq: Three times a day (TID) | ORAL | 0 refills | Status: DC | PRN

## 2020-04-03 NOTE — Patient Instructions (Signed)
PLEASE FEEL FREE TO CALL OUR OFFICE WITH ANY PROBLEMS OR QUESTIONS ([phone])      

## 2020-04-03 NOTE — Progress Notes (Signed)
Subjective:    Patient ID: Robin Morgan, female    DOB: 1956-01-22, 64 y.o.   MRN: [id number]  HPI   Robin Morgan is here in follow up of her chronic pain. At our last visit on 6/30 we injected her right shoulder. She had temporary results with the shoulder injection. She feels the pain is more posterior than before. Therapy has helped but doesn't provide long term relief  She has not seen NS yet.   She remains on [facility] IR for pain relief which congtinues to be effective for her.   Robin Morgan is moving her bowels and bladder.    Pain Inventory Average Pain 8 Pain Right Now 8 My pain is constant and aching  In the last 24 hours, has pain interfered with the following? General activity 9 Relation with others 8 Enjoyment of life 7 What TIME of day is your pain at its worst? morning , daytime, evening and night Sleep (in general) Poor  Pain is worse with: walking, sitting and standing Pain improves with: medication Relief from Meds: 8  Family History  Problem Relation Age of Onset  . Alzheimer's disease Mother   . Diabetes Father   . Heart disease Maternal Grandmother   . Breast cancer Paternal Grandmother 77   Social History   Socioeconomic History  . Marital status: Divorced    Spouse name: Not on file  . Number of children: 1  . Years of education: Not on file  . Highest education level: Not on file  Occupational History  . Occupation: disabled  Tobacco Use  . Smoking status: Never Smoker  . Smokeless tobacco: Never Used  Vaping Use  . Vaping Use: Never used  Substance and Sexual Activity  . Alcohol use: No  . Drug use: No  . Sexual activity: Not Currently  Other Topics Concern  . Not on file  Social History Narrative  . Not on file   Social Determinants of Health   Financial Resource Strain:   . Difficulty of Paying Living Expenses: Not on file  Food Insecurity:   . Worried About Charity fundraiser in the Last Year: Not on file  . Ran Out of Food in the Last  Year: Not on file  Transportation Needs:   . Lack of Transportation (Medical): Not on file  . Lack of Transportation (Non-Medical): Not on file  Physical Activity:   . Days of Exercise per Week: Not on file  . Minutes of Exercise per Session: Not on file  Stress:   . Feeling of Stress : Not on file  Social Connections:   . Frequency of Communication with Friends and Family: Not on file  . Frequency of Social Gatherings with Friends and Family: Not on file  . Attends Religious Services: Not on file  . Active Member of Clubs or Organizations: Not on file  . Attends Archivist Meetings: Not on file  . Marital Status: Not on file   Past Surgical History:  Procedure Laterality Date  . ABDOMINAL HYSTERECTOMY     1986  . APPENDECTOMY     1983  . BREAST EXCISIONAL BIOPSY Right 1990's  . BREAST SURGERY     rt breast cyst done in the 90's  . CHOLECYSTECTOMY    . GALLBLADDER SURGERY     1983  . ROTATOR CUFF REPAIR     right  . SPINE SURGERY    . TONSILLECTOMY     1973   Past Surgical History:  Procedure Laterality Date  . ABDOMINAL HYSTERECTOMY     1986  . APPENDECTOMY     1983  . BREAST EXCISIONAL BIOPSY Right 1990's  . BREAST SURGERY     rt breast cyst done in the 90's  . CHOLECYSTECTOMY    . GALLBLADDER SURGERY     1983  . ROTATOR CUFF REPAIR     right  . SPINE SURGERY    . TONSILLECTOMY     1973   Past Medical History:  Diagnosis Date  . Calcific tendonitis   . Cervical spondylosis without myelopathy   . Depression   . Diabetes mellitus   . Esophageal stricture   . Fibromyalgia   . GERD (gastroesophageal reflux disease)   . Hiatal hernia   . Hyperlipidemia   . Hypertension    BP (!) 116/54   Pulse (!) 105   Temp 98.6 F (37 C)   Ht 5\' 1"  (1.549 m)   Wt 143 lb (64.9 kg)   SpO2 95%   BMI 27.02 kg/m   Opioid Risk Score:   Fall Risk Score:  `1  Depression screen PHQ 2/9  Depression screen [facility] 2/9 02/19/2020 12/06/2019 12/01/2019 09/27/2019  06/16/2018 03/07/2018 02/08/2018  Decreased Interest 3 3 2 3  0 1 0  Down, Depressed, Hopeless 3 3 1 3  0 1 0  PHQ - 2 Score 6 6 3 6  0 2 0  Altered sleeping 3 - 2 - 0 - 0  Tired, decreased energy 3 - 3 - 0 - 0  Change in appetite 3 - 2 - 0 - 0  Feeling bad or failure about yourself  1 - 3 - 0 - 0  Trouble concentrating 3 - 2 - 0 - 0  Moving slowly or fidgety/restless 0 - 1 - 0 - 0  Suicidal thoughts 0 - 0 - 0 - 0  PHQ-9 Score 19 - 16 - 0 - 0  Difficult doing work/chores Very difficult - Very difficult - Not difficult at all - Not difficult at all  Some recent data might be hidden    Review of Systems  Constitutional: Negative.   HENT: Negative.   Eyes: Negative.   Respiratory: Negative.   Cardiovascular: Negative.   Gastrointestinal: Negative.   Endocrine: Negative.   Genitourinary: Negative.   Musculoskeletal: Positive for back pain.  Skin: Negative.   Allergic/Immunologic: Negative.   Neurological: Negative.   Hematological: Negative.   Psychiatric/Behavioral: Negative.   All other systems reviewed and are negative.      Objective:   Physical Exam General: No acute distress HEENT: EOMI, oral membranes moist Cards: reg rate  Chest: normal effort Abdomen: Soft, NT, ND Skin: dry, intact Extremities: no edema Neuro: Pt is cognitively appropriate with normal insight, memory, and awareness. Cranial nerves 2-12 are intact. Sensory exam is normal.  Fine motor coordination is intact. No tremors.    Motor function remains grossly 5 out of 5 with pain inhibition at times.   Reflexes are 1+.  Spurling's test remains negative. Musculoskeletal:  thoracic paraspinals, infraspinatus/lat TP's.  +speeds test R>L present. Head forward posture.. +/- impingement sign one again  Psych:  pleasant             Assessment & Plan:  1. Fibromyalgia: Continue HEp as possible. Encouraged outdoor activities.   2. Rotator cuff syndrome on the right>left:   Patient's exam remains more impressive  for biceps tendonitis once again today        -improved today.  3. Cervicalgia. Post-laminectomy syndrome, facet arthropathy:  Cervical Radiculopathy: Continue Gabapentin.                     -MS IR 15mg  q12 prn #60. RF today       -We will continue the controlled substance monitoring program, this consists of regular clinic visits, examinations, routine drug screening, pill counts as well as use of [place] Controlled Substance Reporting System. [place] was reviewed today.          --? cervical MBB's of cervical spine        -C7-T1 ESI without benefit        -there's a myofascial component to this        -pending eval by NS to assess neck esp at C5-6 where there is disc and facet degeneration leading to fromainal stenosis        -therapy pending to address scapular and cervical posture and ROM, modalities, HEP        -continue tizanidine for muscle spasms. Also helps headaches         -After informed consent and preparation of the skin with isopropyl alcohol, I injected the right infraspinatus and upper lat (2 total) each with 2cc of 1% lidocaine. Pt tolerated without issue  -maintain stretching, postural exercises  4. Depression: Continue Cymbalta.   90 mg daily.--increase to 60mg  bid        -neuropsych for pain mgt techniques, coping skills 6. Lumbar Radiculopathy:   maintain HEP, posture 7 OA of right hand:    Voltaren gel and heat therapy.              . 8. Migraines: stable, largely driven by shoulder girdle and neck pain.  See above  9. Left CTS:S/P EMG on 03/08/2017: S/P Carpal Tunnel Release on 06/14/2017 via . Dr. Apolonio Schneiders.  10. OIC: movantik           -continue diet/probiotic   15 minutes of face to face patient care time were spent during this visit. All questions were encouraged and answered.  Follow up with me in one month .

## 2020-04-06 LAB — TOXASSURE SELECT,+ANTIDEPR,UR

## 2020-04-11 ENCOUNTER — Telehealth: Payer: Self-pay | Admitting: *Deleted

## 2020-04-11 NOTE — Telephone Encounter (Signed)
Urine drug screen for this encounter is consistent for prescribed medication 

## 2020-04-18 DIAGNOSIS — Z961 Presence of intraocular lens: Secondary | ICD-10-CM | POA: Diagnosis not present

## 2020-04-18 DIAGNOSIS — H04123 Dry eye syndrome of bilateral lacrimal glands: Secondary | ICD-10-CM | POA: Diagnosis not present

## 2020-04-23 DIAGNOSIS — M5412 Radiculopathy, cervical region: Secondary | ICD-10-CM | POA: Diagnosis not present

## 2020-04-29 ENCOUNTER — Other Ambulatory Visit: Payer: Self-pay

## 2020-04-29 ENCOUNTER — Encounter: Payer: Medicare HMO | Attending: Physical Medicine and Rehabilitation | Admitting: Psychology

## 2020-04-29 DIAGNOSIS — F411 Generalized anxiety disorder: Secondary | ICD-10-CM | POA: Diagnosis not present

## 2020-04-29 DIAGNOSIS — M961 Postlaminectomy syndrome, not elsewhere classified: Secondary | ICD-10-CM | POA: Diagnosis not present

## 2020-04-29 DIAGNOSIS — M25511 Pain in right shoulder: Secondary | ICD-10-CM | POA: Insufficient documentation

## 2020-04-29 DIAGNOSIS — F339 Major depressive disorder, recurrent, unspecified: Secondary | ICD-10-CM | POA: Diagnosis not present

## 2020-04-29 DIAGNOSIS — M609 Myositis, unspecified: Secondary | ICD-10-CM | POA: Diagnosis present

## 2020-04-29 DIAGNOSIS — E1142 Type 2 diabetes mellitus with diabetic polyneuropathy: Secondary | ICD-10-CM | POA: Insufficient documentation

## 2020-04-29 DIAGNOSIS — G894 Chronic pain syndrome: Secondary | ICD-10-CM

## 2020-04-29 DIAGNOSIS — M7918 Myalgia, other site: Secondary | ICD-10-CM | POA: Insufficient documentation

## 2020-04-29 DIAGNOSIS — Z79891 Long term (current) use of opiate analgesic: Secondary | ICD-10-CM | POA: Insufficient documentation

## 2020-04-29 DIAGNOSIS — Z5181 Encounter for therapeutic drug level monitoring: Secondary | ICD-10-CM | POA: Diagnosis not present

## 2020-04-29 DIAGNOSIS — R69 Illness, unspecified: Secondary | ICD-10-CM | POA: Diagnosis not present

## 2020-04-29 DIAGNOSIS — M797 Fibromyalgia: Secondary | ICD-10-CM | POA: Insufficient documentation

## 2020-04-29 DIAGNOSIS — M549 Dorsalgia, unspecified: Secondary | ICD-10-CM | POA: Insufficient documentation

## 2020-05-07 ENCOUNTER — Encounter: Payer: Self-pay | Admitting: Psychology

## 2020-05-07 DIAGNOSIS — M542 Cervicalgia: Secondary | ICD-10-CM | POA: Diagnosis not present

## 2020-05-07 DIAGNOSIS — M5412 Radiculopathy, cervical region: Secondary | ICD-10-CM | POA: Diagnosis not present

## 2020-05-07 DIAGNOSIS — Z6826 Body mass index (BMI) 26.0-26.9, adult: Secondary | ICD-10-CM | POA: Diagnosis not present

## 2020-05-07 DIAGNOSIS — R030 Elevated blood-pressure reading, without diagnosis of hypertension: Secondary | ICD-10-CM | POA: Diagnosis not present

## 2020-05-07 NOTE — Progress Notes (Signed)
Neuropsychology Visit  Patient:  Robin Morgan   DOB: [birth year]  MR Number: [id number]  Location: [facility] [place], [place] [id number] [place] [id number] Dept: [phone]  Date of Service: 04/29/2020  Start: 3 PM End: 4 PM  Duration of Service: 1 Hour  Today's visit was an in person visit that was conducted in my outpatient clinic office.  The patient myself were present.  Provider/Observer:     Edgardo Roys PsyD  Chief Complaint:      Chief Complaint  Patient presents with  . Pain  . Back Pain  . Neck Pain  . Fatigue  . Anxiety  . Depression    Reason For Service:     Robin Morgan is a 64 year old right-handed female referred by Dr. Naaman Morgan for neuropsychological/psychological consultation due to significant depression and anxiety with primary stressors related to family issues in the setting of fibromyalgia and chronic severe pain symptoms.  The patient reports that she has had significant pain in her back primarily mid back for 20 years or more.  The patient is also been diagnosed with fibromyalgia.  The patient had surgery on her neck to try to alleviate help with her severe recurrent headaches but experience little change post surgery.  The patient also has significant shoulder pain and severe headache with these pain and headaches resulting in her having to spend multiple days at a time essentially in bed.  Treatment Interventions:  Therapeutic interventions for issues related to chronic pain/fibromyalgia and recurrent headaches and depression anxiety symptoms with significant psychosocial stressors.  Participation Level:   Active  Participation Quality:  Appropriate and Attentive      Behavioral Observation:  Well Groomed, Alert, and Appropriate.   Current Psychosocial Factors: The patient continues to have significant psychosocial  stressors with particular stresses related to her older sister and the forced interaction they have around the care of their father who is in decreasing health and need significant assistance.  These difficulties started with the care of their mother that Alzheimer's was now passed away.  The sister is very controlling and when the patient and her middle sister attempted do things the younger sister becomes very controlling and manipulative.  Content of Session:   Reviewed current symptoms and continue to develop coping skills around her chronic pain and fibromyalgia symptoms as well as headaches and dealing with cervical postlaminectomy syndrome.  We also worked on significant psychosocial stressors that are being impacted by the patient's limited physical functioning as well as her stressors having a significant deleterious on her overall pain symptoms.  Effectiveness of Interventions: The patient has been quite open and active in these therapeutic processes and rapport been easily established.  The patient's cognitive function is good and while stress affects her ability to stay focused this is not indicative of any type of cognitive deficits with the patient.  The patient is already working on some of the initial issues we have focused around sleep hygiene, physical activity and other coping issues around her chronic pain symptoms.  Target Goals:   Goals include building better coping and adaptive skills around her chronic pain symptoms and working on better pain management as well as working on significant psychosocial stressors that are negatively impacting her current medical issues.  Goals Last Reviewed:   04/29/2020  Goals Addressed Today:    Today we worked a lot on the various stressors in  her life that are exacerbating her chronic pain symptoms.  Impression/Diagnosis:   Robin Morgan is a 64 year old right-handed female referred by Dr. Naaman Morgan for neuropsychological/psychological  consultation due to significant depression and anxiety with primary stressors related to family issues in the setting of fibromyalgia and chronic severe pain symptoms.  The patient reports that she has had significant pain in her back primarily mid back for 20 years or more.  The patient is also been diagnosed with fibromyalgia.  The patient had surgery on her neck to try to alleviate help with her severe recurrent headaches but experience little change post surgery.  The patient also has significant shoulder pain and severe headache with these pain and headaches resulting in her having to spend multiple days at a time essentially in bed.  I do think that the patient's anxiety, stress and depressive symptoms do play and exacerbating role in her overall pain symptoms and while she clearly has abnormalities in lumbar and cervical regions as a primary factor for her pain symptoms her stress responses, fibromyalgia and depression do play a role in the acute day-to-day level of her pain.  Diagnosis:   Fibromyalgia  Chronic pain syndrome  Depression, recurrent (HCC)  Generalized anxiety disorder  Cervical post-laminectomy syndrome  Chronic pain associated with significant psychosocial dysfunction    Robin Morgan, Psy.D. Clinical Psychologist Neuropsychologist

## 2020-05-13 ENCOUNTER — Encounter: Payer: Medicare HMO | Admitting: Psychology

## 2020-05-27 ENCOUNTER — Encounter: Payer: Self-pay | Admitting: Family Medicine

## 2020-05-27 ENCOUNTER — Ambulatory Visit (INDEPENDENT_AMBULATORY_CARE_PROVIDER_SITE_OTHER): Payer: Medicare HMO | Admitting: Family Medicine

## 2020-05-27 ENCOUNTER — Other Ambulatory Visit: Payer: Self-pay

## 2020-05-27 ENCOUNTER — Other Ambulatory Visit: Payer: Self-pay | Admitting: Registered Nurse

## 2020-05-27 VITALS — BP 116/58 | HR 92 | Temp 97.5°F | Ht 61.0 in | Wt 140.4 lb

## 2020-05-27 DIAGNOSIS — Z1231 Encounter for screening mammogram for malignant neoplasm of breast: Secondary | ICD-10-CM | POA: Diagnosis not present

## 2020-05-27 DIAGNOSIS — E1140 Type 2 diabetes mellitus with diabetic neuropathy, unspecified: Secondary | ICD-10-CM | POA: Diagnosis not present

## 2020-05-27 DIAGNOSIS — M797 Fibromyalgia: Secondary | ICD-10-CM

## 2020-05-27 DIAGNOSIS — Z0000 Encounter for general adult medical examination without abnormal findings: Secondary | ICD-10-CM

## 2020-05-27 DIAGNOSIS — I10 Essential (primary) hypertension: Secondary | ICD-10-CM | POA: Diagnosis not present

## 2020-05-27 DIAGNOSIS — E1142 Type 2 diabetes mellitus with diabetic polyneuropathy: Secondary | ICD-10-CM | POA: Diagnosis not present

## 2020-05-27 DIAGNOSIS — Z794 Long term (current) use of insulin: Secondary | ICD-10-CM

## 2020-05-27 DIAGNOSIS — E039 Hypothyroidism, unspecified: Secondary | ICD-10-CM | POA: Diagnosis not present

## 2020-05-27 DIAGNOSIS — G4733 Obstructive sleep apnea (adult) (pediatric): Secondary | ICD-10-CM | POA: Diagnosis not present

## 2020-05-27 DIAGNOSIS — E782 Mixed hyperlipidemia: Secondary | ICD-10-CM | POA: Diagnosis not present

## 2020-05-27 DIAGNOSIS — M7918 Myalgia, other site: Secondary | ICD-10-CM

## 2020-05-27 DIAGNOSIS — G894 Chronic pain syndrome: Secondary | ICD-10-CM | POA: Diagnosis not present

## 2020-05-27 LAB — POCT GLYCOSYLATED HEMOGLOBIN (HGB A1C): Hemoglobin A1C: 6.2 % — AB (ref 4.0–5.6)

## 2020-05-27 MED ORDER — LISINOPRIL 10 MG PO TABS
5.0000 mg | ORAL_TABLET | Freq: Every day | ORAL | 3 refills | Status: DC
Start: 2020-05-27 — End: 2020-06-27

## 2020-05-27 MED ORDER — SHINGRIX 50 MCG/0.5ML IM SUSR: 0.5000 mL | Freq: Once | INTRAMUSCULAR | 0 refills | Status: AC

## 2020-05-27 NOTE — Addendum Note (Signed)
Addended by: Doran Clay A on: 05/27/2020 01:17 PM   Modules accepted: Orders

## 2020-05-27 NOTE — Progress Notes (Signed)
Subjective  Chief Complaint  Patient presents with   Diabetes    HPI: Robin Morgan is a 64 y.o. female who presents to [facility] [place] today for a Female Wellness Visit. She also has the concerns and/or needs as listed above in the chief complaint. These will be addressed in addition to the Health Maintenance Visit.   Wellness Visit: annual visit with health maintenance review and exam without Pap   HM: due mammo and dexa.  Chronic disease f/u and/or acute problem visit: (deemed necessary to be done in addition to the wellness visit):  Diabetes follow up: we added farxiga at last visit due to worsening control.  Her diabetic control is reported as Improved.  She is tolerating new medication well.  No adverse effects. She denies exertional CP or SOB or symptomatic hypoglycemia. She denies foot sores.  She suffers from peripheral neuropathy burning pain.  She is on gabapentin.  Patient is up-to-date.  Hypertension: Blood pressure running a little bit low and she admits to orthostatic hypotensive symptoms rarely.  She is on lisinopril.  Chronic pain: This has been very active and she is struggling.  Also having opioid-induced constipation.  She is seeing a new Chief of Staff for further investigation and treatment options.  Chronic depression: Difficult times due to her pain but she says she is holding steady.  She continues on her antidepressant  Hyperlipidemia and hypothyroidism: Both due for recheck today.  She takes her medications as recommended.  No symptoms of low thyroid  Immunization History  Administered Date(s) Administered   Fluad Quad(high Dose 65+) 04/06/2019   Influenza Split 06/04/2009, 05/02/2012   Influenza, High Dose Seasonal PF 05/13/2020   Influenza, Quadrivalent, Recombinant, Inj, Pf 05/12/2016, 07/07/2017   Influenza, Seasonal, Injecte, Preservative Fre 06/06/2014, 04/24/2015   Influenza,inj,Quad PF,6+ Mos 05/12/2016, 07/07/2017,  05/05/2018   Influenza-Unspecified 09/03/2011, 04/19/2013   PFIZER SARS-COV-2 Vaccination 10/03/2019, 10/24/2019   Pneumococcal Conjugate-13 12/01/2019   Pneumococcal Polysaccharide-23 08/29/2009, 10/19/2018   Tdap 05/11/2012    Diabetes Related Lab Review: Lab Results  Component Value Date   HGBA1C 7.7 (A) 02/19/2020   HGBA1C 7.3 (A) 12/01/2019   HGBA1C 7.3 12/01/2019   HGBA1C 7.3 (A) 12/01/2019   HGBA1C 7.3 (A) 12/01/2019    Lab Results  Component Value Date   MICROALBUR <0.7 06/05/2019   Lab Results  Component Value Date   CREATININE 0.95 06/05/2019   BUN 21 06/05/2019   NA 140 06/05/2019   K 4.2 06/05/2019   CL 101 06/05/2019   CO2 28 06/05/2019   Lab Results  Component Value Date   CHOL 140 06/05/2019   CHOL 132 06/16/2018   Lab Results  Component Value Date   HDL 36.50 (L) 06/05/2019   HDL 39.90 06/16/2018   Lab Results  Component Value Date   LDLCALC 73 06/05/2019   LDLCALC 68 06/16/2018   Lab Results  Component Value Date   TRIG 151.0 (H) 06/05/2019   TRIG 120.0 06/16/2018   Lab Results  Component Value Date   CHOLHDL 4 06/05/2019   CHOLHDL 3 06/16/2018   No results found for: LDLDIRECT The 10-year ASCVD risk score Mikey Bussing DC Jr., et al., 2013) is: 12.6%   Values used to calculate the score:     Age: 9 years     Sex: Female     Is Non-Hispanic African American: No     Diabetic: Yes     Tobacco smoker: No     Systolic  Blood Pressure: 116 mmHg     Is BP treated: Yes     HDL Cholesterol: 36.5 mg/dL     Total Cholesterol: 140 mg/dL  BP Readings from Last 3 Encounters:  05/27/20 (!) 116/58  04/03/20 (!) 116/54  02/19/20 122/70   Wt Readings from Last 3 Encounters:  05/27/20 140 lb 6.4 oz (63.7 kg)  04/03/20 143 lb (64.9 kg)  02/19/20 144 lb 6.4 oz (65.5 kg)    Health Maintenance  Topic Date Due   DEXA SCAN  11/04/2019   MAMMOGRAM  04/13/2020   OPHTHALMOLOGY EXAM  08/06/2020   HEMOGLOBIN A1C  08/21/2020   FOOT EXAM   05/27/2021   TETANUS/TDAP  05/11/2022   COLONOSCOPY  11/19/2025   INFLUENZA VACCINE  Completed   COVID-19 Vaccine  Completed   Hepatitis C Screening  Completed   PNA vac Low Risk Adult  Completed     Assessment  1. Annual physical exam   2. Controlled type 2 diabetes mellitus with diabetic neuropathy, with long-term current use of insulin ([place])   3. Diabetic peripheral neuropathy associated with type 2 diabetes mellitus ([place])   4. Chronic pain associated with significant psychosocial dysfunction   5. Primary hypertension   6. Mixed hyperlipidemia   7. Hypothyroidism, acquired   8. OSA (obstructive sleep apnea)   9. Fibromyalgia   10. Encounter for screening mammogram for breast cancer      Plan  Female Wellness Visit:  Age appropriate Health Maintenance and Prevention measures were discussed with patient. Included topics are cancer screening recommendations, ways to keep healthy (see AVS) including dietary and exercise recommendations, regular eye and dental care, use of seat belts, and avoidance of moderate alcohol use and tobacco use.  Mammogram and bone density ordered  BMI: discussed patient's BMI and encouraged positive lifestyle modifications to help get to or maintain a target BMI.  HM needs and immunizations were addressed and ordered. See below for orders. See HM and immunization section for updates.  Routine labs and screening tests ordered including cmp, cbc and lipids where appropriate.  Discussed recommendations regarding Vit D and calcium supplementation (see AVS)  Chronic disease management visit and/or acute problem visit:  Diabetes: Not well controlled.  Continue Wilder Glade and other medications.  Check lab work  Hypertension: Decrease lisinopril to 5 mg daily given borderline low blood pressures and some symptoms.  Educated.  Recheck lipids and thyroid again.  Adjust doses as needed  Chronic pain: Per specialist.  Very difficult for her.  Continue  gabapentin for diabetic neuropathy.   Follow up: 3 months for blood pressure and diabetes recheck Orders Placed This Encounter  Procedures   MM DIGITAL SCREENING BILATERAL   CBC with Differential/Platelet   COMPLETE METABOLIC PANEL WITH GFR   Lipid panel   TSH   No orders of the defined types were placed in this encounter.     Lifestyle: Body mass index is 26.53 kg/m. Wt Readings from Last 3 Encounters:  05/27/20 140 lb 6.4 oz (63.7 kg)  04/03/20 143 lb (64.9 kg)  02/19/20 144 lb 6.4 oz (65.5 kg)    Patient Active Problem List   Diagnosis Date Noted   Diabetic peripheral neuropathy associated with type 2 diabetes mellitus ([place]) 01/06/2017    Priority: High   OSA (obstructive sleep apnea) 03/10/2016    Priority: High   Controlled diabetes mellitus with diabetic neuropathy, with long-term current use of insulin ([place]) 10/11/2014    Priority: High   HTN (hypertension) 10/11/2014  Priority: High   Hyperlipidemia 10/11/2014    Priority: High   DDD (degenerative disc disease), lumbar 01/15/2014    Priority: High   Hypothyroidism, acquired 06/12/2013    Priority: High   Cervical post-laminectomy syndrome 05/10/2013    Priority: High   Chronic pain associated with significant psychosocial dysfunction 01/03/2010    Priority: High    Overview:  Chronic Pain Syndrome, Dr. Earl Lites  Overview:  Overview:  Overview:  Chronic Pain Syndrome, Dr. Earl Lites    Esophageal stricture     Priority: Medium   Tenosynovitis, wrist 10/07/2017    Priority: Medium   Trigger thumb of left hand 10/07/2017    Priority: Medium   Laryngopharyngeal reflux (LPR) 08/26/2017    Priority: Medium   Dysphagia 08/26/2017    Priority: Medium   Left carpal tunnel syndrome 03/08/2017    Priority: Medium   Fibromyalgia 02/12/2016    Priority: Medium   Osteopenia 12/01/2012    Priority: Medium    10/2017: osteopenia: T = -1.9; stable. Recheck 2 years T = -1.2 at  hip, -1.8 at spine, 4/ 2014    Chronic diarrhea 07/04/2012    Priority: Medium    Overview:  ? Related to gallbladder surgery. Has been evaluated by Dr. Amedeo Plenty [facility]. Lomotil as needed.    Biceps tendinitis, right 02/07/2020   Mid back pain 12/06/2019   Insomnia due to medical condition 12/01/2019   Chronic prescription opiate use 09/02/2018   Myofascial muscle pain 10/28/2011   Depression, recurrent ([place]) 10/28/2011   Health Maintenance  Topic Date Due   DEXA SCAN  11/04/2019   MAMMOGRAM  04/13/2020   OPHTHALMOLOGY EXAM  08/06/2020   HEMOGLOBIN A1C  08/21/2020   FOOT EXAM  05/27/2021   TETANUS/TDAP  05/11/2022   COLONOSCOPY  11/19/2025   INFLUENZA VACCINE  Completed   COVID-19 Vaccine  Completed   Hepatitis C Screening  Completed   PNA vac Low Risk Adult  Completed   Immunization History  Administered Date(s) Administered   Fluad Quad(high Dose 65+) 04/06/2019   Influenza Split 06/04/2009, 05/02/2012   Influenza, High Dose Seasonal PF 05/13/2020   Influenza, Quadrivalent, Recombinant, Inj, Pf 05/12/2016, 07/07/2017   Influenza, Seasonal, Injecte, Preservative Fre 06/06/2014, 04/24/2015   Influenza,inj,Quad PF,6+ Mos 05/12/2016, 07/07/2017, 05/05/2018   Influenza-Unspecified 09/03/2011, 04/19/2013   PFIZER SARS-COV-2 Vaccination 10/03/2019, 10/24/2019   Pneumococcal Conjugate-13 12/01/2019   Pneumococcal Polysaccharide-23 08/29/2009, 10/19/2018   Tdap 05/11/2012   We updated and reviewed the patient's past history in detail and it is documented below. Allergies: Patient is allergic to compazine, prochlorperazine, and naproxen. Past Medical History Patient  has a past medical history of Calcific tendonitis, Cervical spondylosis without myelopathy, Depression, Diabetes mellitus, Esophageal stricture, Fibromyalgia, GERD (gastroesophageal reflux disease), Hiatal hernia, Hyperlipidemia, and Hypertension. Past Surgical History Patient  has a past  surgical history that includes Spine surgery; Cholecystectomy; Tonsillectomy; Rotator cuff repair; Breast excisional biopsy (Right, 1990's); Gallbladder surgery; Breast surgery; Appendectomy; and Abdominal hysterectomy. Family History: Patient family history includes Alzheimer's disease in her mother; Breast cancer (age of onset: 28) in her paternal grandmother; Diabetes in her father; Heart disease in her maternal grandmother. Social History:  Patient  reports that she has never smoked. She has never used smokeless tobacco. She reports that she does not drink alcohol and does not use drugs.  Review of Systems: Constitutional: negative for fever or malaise Ophthalmic: negative for photophobia, double vision or loss of vision Cardiovascular: negative for chest pain, dyspnea on exertion, or new LE  swelling Respiratory: negative for SOB or persistent cough Gastrointestinal: negative for abdominal pain, change in bowel habits or melena Genitourinary: negative for dysuria or gross hematuria, no abnormal uterine bleeding or disharge Musculoskeletal: negative for new gait disturbance or muscular weakness Integumentary: negative for new or persistent rashes, no breast lumps Neurological: negative for TIA or stroke symptoms Psychiatric: negative for SI or delusions Allergic/Immunologic: negative for hives  Patient Care Team    Relationship Specialty Notifications Start End  Leamon Arnt, MD PCP - General Family Medicine  11/09/17   Iran Planas, MD Consulting Physician Orthopedic Surgery  11/09/17   Meredith Staggers, MD Consulting Physician Physical Medicine and Rehabilitation  11/09/17   Jolene Provost, PA-C Physician Assistant Otolaryngology  11/09/17   Dohmeier, Asencion Partridge, MD Consulting Physician Neurology  11/09/17   Arta Silence, MD Consulting Physician Gastroenterology  11/23/18   Hortencia Pilar, MD Consulting Physician Ophthalmology  12/01/19     Objective  Vitals: BP (!) 116/58     Pulse 92    Temp (!) 97.5 F (36.4 C) (Temporal)    Ht 5\' 1"  (1.549 m)    Wt 140 lb 6.4 oz (63.7 kg)    SpO2 95%    BMI 26.53 kg/m  General:  Well developed, well nourished, no acute distress  Psych:  Alert and orientedx3,normal mood and affect HEENT:  Normocephalic, atraumatic, non-icteric sclera,  supple neck without adenopathy, mass or thyromegaly Cardiovascular:  Normal S1, S2, RRR without gallop, rub or murmur Respiratory:  Good breath sounds bilaterally, CTAB with normal respiratory effort Gastrointestinal: normal bowel sounds, soft, non-tender, no noted masses. No HSM MSK: no deformities, contusions. Joints are without erythema or swelling.  Skin:  Warm, no rashes or suspicious lesions noted Neurologic:    Mental status is normal.  Gross motor and sensory exams are normal. Normal gait. No tremor Diabetic Foot Exam: Appearance - no lesions, ulcers or calluses Skin - no sigificant pallor or erythema Monofilament testing - sensitive bilaterally in following locations:  Right - Great toe, medial, central, lateral ball and posterior foot intact  Left - Great toe, medial, central, lateral ball and posterior foot intact Pulses - +2 distally bilaterally   Commons side effects, risks, benefits, and alternatives for medications and treatment plan prescribed today were discussed, and the patient expressed understanding of the given instructions. Patient is instructed to call or message via MyChart if he/she has any questions or concerns regarding our treatment plan. No barriers to understanding were identified. We discussed Red Flag symptoms and signs in detail. Patient expressed understanding regarding what to do in case of urgent or emergency type symptoms.   Medication list was reconciled, printed and provided to the patient in AVS. Patient instructions and summary information was reviewed with the patient as documented in the AVS. This note was prepared with assistance of Dragon voice  recognition software. Occasional wrong-word or sound-a-like substitutions may have occurred due to the inherent limitations of voice recognition software  This visit occurred during the SARS-CoV-2 public health emergency.  Safety protocols were in place, including screening questions prior to the visit, additional usage of staff PPE, and extensive cleaning of exam room while observing appropriate contact time as indicated for disinfecting solutions.

## 2020-05-27 NOTE — Patient Instructions (Signed)
Please return in 3 months for diabetes follow up and blood pressure recheck.   Please cut your lisinopril pill in half; we are decreasing it to 5mg  daily (down from 10mg ). Your diabetes is now doing well!  I will release your lab results to you on your MyChart account with further instructions. Please reply with any questions.  Please take the prescription for Shingrix to the pharmacy so they may administer the vaccinations. Your insurance will then cover the injections.    If you have any questions or concerns, please don't hesitate to send me a message via MyChart or call the office at [phone]. Thank you for visiting with [place] today! It's our pleasure caring for you.

## 2020-05-27 NOTE — Addendum Note (Signed)
Addended by: Doran Clay A on: 05/27/2020 01:35 PM   Modules accepted: Orders

## 2020-05-28 LAB — COMPLETE METABOLIC PANEL WITH GFR
AG Ratio: 1.8 (calc) (ref 1.0–2.5)
ALT: 18 U/L (ref 6–29)
AST: 27 U/L (ref 10–35)
Albumin: 4.2 g/dL (ref 3.6–5.1)
Alkaline phosphatase (APISO): 48 U/L (ref 37–153)
BUN: 13 mg/dL (ref 7–25)
CO2: 26 mmol/L (ref 20–32)
Calcium: 9.5 mg/dL (ref 8.6–10.4)
Chloride: 107 mmol/L (ref 98–110)
Creat: 0.86 mg/dL (ref 0.50–0.99)
GFR, Est African American: 82 mL/min/{1.73_m2} (ref >=60)
GFR, Est Non African American: 70 mL/min/{1.73_m2} (ref >=60)
Globulin: 2.4 g/dL (calc) (ref 1.9–3.7)
Glucose, Bld: 64 mg/dL — ABNORMAL LOW (ref 65–99)
Potassium: 4.3 mmol/L (ref 3.5–5.3)
Sodium: 142 mmol/L (ref 135–146)
Total Bilirubin: 0.4 mg/dL (ref 0.2–1.2)
Total Protein: 6.6 g/dL (ref 6.1–8.1)

## 2020-05-28 LAB — CBC WITH DIFFERENTIAL/PLATELET
Absolute Monocytes: 675 cells/uL (ref 200–950)
Basophils Absolute: 38 cells/uL (ref 0–200)
Basophils Relative: 0.4 %
Eosinophils Absolute: 247 cells/uL (ref 15–500)
Eosinophils Relative: 2.6 %
HCT: 40.4 % (ref 35.0–45.0)
Hemoglobin: 12.5 g/dL (ref 11.7–15.5)
Lymphs Abs: 3363 cells/uL (ref 850–3900)
MCH: 26.1 pg — ABNORMAL LOW (ref 27.0–33.0)
MCHC: 30.9 g/dL — ABNORMAL LOW (ref 32.0–36.0)
MCV: 84.3 fL (ref 80.0–100.0)
MPV: 12.3 fL (ref 7.5–12.5)
Monocytes Relative: 7.1 %
Neutro Abs: 5178 cells/uL (ref 1500–7800)
Neutrophils Relative %: 54.5 %
Platelets: 401 10*3/uL — ABNORMAL HIGH (ref 140–400)
RBC: 4.79 10*6/uL (ref 3.80–5.10)
RDW: 14.1 % (ref 11.0–15.0)
Total Lymphocyte: 35.4 %
WBC: 9.5 10*3/uL (ref 3.8–10.8)

## 2020-05-28 LAB — LIPID PANEL
Cholesterol: 116 mg/dL (ref <200)
HDL: 39 mg/dL — ABNORMAL LOW (ref >=50)
LDL Cholesterol (Calc): 56 mg/dL (calc)
Non-HDL Cholesterol (Calc): 77 mg/dL (calc) (ref <130)
Total CHOL/HDL Ratio: 3 (calc) (ref <5.0)
Triglycerides: 124 mg/dL (ref <150)

## 2020-05-28 LAB — TSH: TSH: 0.61 mIU/L (ref 0.40–4.50)

## 2020-05-28 LAB — HEMOGLOBIN A1C
Hgb A1c MFr Bld: 6.6 % of total Hgb — ABNORMAL HIGH (ref <5.7)
Mean Plasma Glucose: 143 (calc)
eAG (mmol/L): 7.9 (calc)

## 2020-05-30 ENCOUNTER — Other Ambulatory Visit: Payer: Self-pay | Admitting: Family Medicine

## 2020-05-31 ENCOUNTER — Other Ambulatory Visit: Payer: Self-pay | Admitting: Family Medicine

## 2020-05-31 DIAGNOSIS — Z13820 Encounter for screening for osteoporosis: Secondary | ICD-10-CM

## 2020-05-31 DIAGNOSIS — Z1231 Encounter for screening mammogram for malignant neoplasm of breast: Secondary | ICD-10-CM

## 2020-06-03 ENCOUNTER — Encounter: Payer: Medicare HMO | Attending: Physical Medicine and Rehabilitation | Admitting: Psychology

## 2020-06-03 ENCOUNTER — Ambulatory Visit: Payer: Medicare HMO

## 2020-06-03 ENCOUNTER — Other Ambulatory Visit: Payer: Self-pay

## 2020-06-03 DIAGNOSIS — M961 Postlaminectomy syndrome, not elsewhere classified: Secondary | ICD-10-CM | POA: Diagnosis not present

## 2020-06-03 DIAGNOSIS — F339 Major depressive disorder, recurrent, unspecified: Secondary | ICD-10-CM | POA: Diagnosis not present

## 2020-06-03 DIAGNOSIS — G894 Chronic pain syndrome: Secondary | ICD-10-CM | POA: Diagnosis not present

## 2020-06-03 DIAGNOSIS — M797 Fibromyalgia: Secondary | ICD-10-CM | POA: Diagnosis not present

## 2020-06-03 DIAGNOSIS — F411 Generalized anxiety disorder: Secondary | ICD-10-CM | POA: Diagnosis not present

## 2020-06-03 DIAGNOSIS — M7918 Myalgia, other site: Secondary | ICD-10-CM | POA: Diagnosis not present

## 2020-06-05 ENCOUNTER — Other Ambulatory Visit: Payer: Self-pay

## 2020-06-05 ENCOUNTER — Encounter: Payer: Self-pay | Admitting: Physical Medicine & Rehabilitation

## 2020-06-05 ENCOUNTER — Encounter (HOSPITAL_BASED_OUTPATIENT_CLINIC_OR_DEPARTMENT_OTHER): Payer: Medicare HMO | Admitting: Physical Medicine & Rehabilitation

## 2020-06-05 DIAGNOSIS — M797 Fibromyalgia: Secondary | ICD-10-CM | POA: Diagnosis not present

## 2020-06-05 DIAGNOSIS — M7918 Myalgia, other site: Secondary | ICD-10-CM | POA: Diagnosis not present

## 2020-06-05 MED ORDER — MORPHINE SULFATE 15 MG PO TABS: 15.0000 mg | ORAL_TABLET | Freq: Three times a day (TID) | ORAL | 0 refills | Status: DC | PRN

## 2020-06-05 NOTE — Progress Notes (Signed)
Subjective:    Patient ID: Robin Morgan, female    DOB: 08-15-1955, 64 y.o.   MRN: [id number]  HPI  Mrs Meares is here in follow up of her chronic pain and cervical post-laminectomy syndrome. She has developed pain in her left shoulder over the last 2 weeks. The left shoulder is tender with IR/ER and when she tries to lift the arm overhead. There is no radiation past the mid upper arm. She denies any tingling in her hands or arms. Neck is still tender.   We performed trigger point injections around her right posterior scapula at last visit without substantial benefit. She started therapy and has had some dry needling which has been beneficial. She has a cervical injection scheduled with NS tomorrow although i'm not even clear as to what they're doing exactly.   She remains on ms IR for pain control. She also uses gabapentin, cymbalta and tizanidine  [name] maintains f/u with neuropsych for coping skills, mood and finds it helpful.   Pain Inventory Average Pain 9 Pain Right Now 8 My pain is constant, stabbing and aching  In the last 24 hours, has pain interfered with the following? General activity 7 Relation with others 8 Enjoyment of life 9 What TIME of day is your pain at its worst? morning , daytime, evening and night Sleep (in general) Poor  Pain is worse with: bending and standing Pain improves with: medication Relief from Meds: 7  Family History  Problem Relation Age of Onset  . Alzheimer's disease Mother   . Diabetes Father   . Heart disease Maternal Grandmother   . Breast cancer Paternal Grandmother 70   Social History   Socioeconomic History  . Marital status: Divorced    Spouse name: Not on file  . Number of children: 1  . Years of education: Not on file  . Highest education level: Not on file  Occupational History  . Occupation: disabled  Tobacco Use  . Smoking status: Never Smoker  . Smokeless tobacco: Never Used  Vaping Use  . Vaping Use: Never used    Substance and Sexual Activity  . Alcohol use: No  . Drug use: No  . Sexual activity: Not Currently  Other Topics Concern  . Not on file  Social History Narrative  . Not on file   Social Determinants of Health   Financial Resource Strain:   . Difficulty of Paying Living Expenses: Not on file  Food Insecurity:   . Worried About Charity fundraiser in the Last Year: Not on file  . Ran Out of Food in the Last Year: Not on file  Transportation Needs:   . Lack of Transportation (Medical): Not on file  . Lack of Transportation (Non-Medical): Not on file  Physical Activity:   . Days of Exercise per Week: Not on file  . Minutes of Exercise per Session: Not on file  Stress:   . Feeling of Stress : Not on file  Social Connections:   . Frequency of Communication with Friends and Family: Not on file  . Frequency of Social Gatherings with Friends and Family: Not on file  . Attends Religious Services: Not on file  . Active Member of Clubs or Organizations: Not on file  . Attends Archivist Meetings: Not on file  . Marital Status: Not on file   Past Surgical History:  Procedure Laterality Date  . ABDOMINAL HYSTERECTOMY     1986  . APPENDECTOMY  1983  . BREAST EXCISIONAL BIOPSY Right 1990's  . BREAST SURGERY     rt breast cyst done in the 90's  . CHOLECYSTECTOMY    . GALLBLADDER SURGERY     1983  . ROTATOR CUFF REPAIR     right  . SPINE SURGERY    . TONSILLECTOMY     1973   Past Surgical History:  Procedure Laterality Date  . ABDOMINAL HYSTERECTOMY     1986  . APPENDECTOMY     1983  . BREAST EXCISIONAL BIOPSY Right 1990's  . BREAST SURGERY     rt breast cyst done in the 90's  . CHOLECYSTECTOMY    . GALLBLADDER SURGERY     1983  . ROTATOR CUFF REPAIR     right  . SPINE SURGERY    . TONSILLECTOMY     1973   Past Medical History:  Diagnosis Date  . Calcific tendonitis   . Cervical spondylosis without myelopathy   . Depression   . Diabetes mellitus    . Esophageal stricture   . Fibromyalgia   . GERD (gastroesophageal reflux disease)   . Hiatal hernia   . Hyperlipidemia   . Hypertension    BP (!) 116/54   Pulse 96   Temp 98.7 F (37.1 C)   Ht 5\' 1"  (1.549 m)   Wt 135 lb (61.2 kg)   SpO2 94%   BMI 25.51 kg/m   Opioid Risk Score:   Fall Risk Score:  `1  Depression screen PHQ 2/9  Depression screen [facility] 2/9 02/19/2020 12/06/2019 12/01/2019 09/27/2019 06/16/2018 03/07/2018 02/08/2018  Decreased Interest 3 3 2 3  0 1 0  Down, Depressed, Hopeless 3 3 1 3  0 1 0  PHQ - 2 Score 6 6 3 6  0 2 0  Altered sleeping 3 - 2 - 0 - 0  Tired, decreased energy 3 - 3 - 0 - 0  Change in appetite 3 - 2 - 0 - 0  Feeling bad or failure about yourself  1 - 3 - 0 - 0  Trouble concentrating 3 - 2 - 0 - 0  Moving slowly or fidgety/restless 0 - 1 - 0 - 0  Suicidal thoughts 0 - 0 - 0 - 0  PHQ-9 Score 19 - 16 - 0 - 0  Difficult doing work/chores Very difficult - Very difficult - Not difficult at all - Not difficult at all  Some recent data might be hidden    Review of Systems  Constitutional: Negative.   HENT: Negative.   Eyes: Negative.   Respiratory: Negative.   Cardiovascular: Negative.   Gastrointestinal: Negative.   Endocrine: Negative.   Genitourinary: Negative.   Musculoskeletal: Positive for neck pain.  Skin: Negative.   Allergic/Immunologic: Negative.   Neurological: Negative.   Hematological: Negative.   Psychiatric/Behavioral: Negative.   All other systems reviewed and are negative.      Objective:   Physical Exam  General: No acute distress HEENT: EOMI, oral membranes moist Cards: reg rate  Chest: normal effort Abdomen: Soft, NT, ND Skin: dry, intact Extremities: no edema Neuro: Pt is cognitively appropriate with normal insight, memory, and awareness. Cranial nerves 2-12 are intact. Sensory exam is normal.  Fine motor coordination is intact. No tremors.    Motor function remains grossly 5 out of 5 in all 4's.    Reflexes are 1+  2+.  Spurling's test negative Musculoskeletal:  TP's right periscapular.  Head forward posture.. + impingement sign bilaterally  L>R  Psych:  pleasant as always             Assessment & Plan:  1. Fibromyalgia: Continue HEp as possible. Encouraged outdoor activities.   2. Rotator cuff syndrome bilaterally.   -symptoms more pronounced on left today  -has had bicipital tendon pain as well.           -would like therapy to address these             3. Cervicalgia. Post-laminectomy syndrome, facet arthropathy:  Cervical Radiculopathy: Continue Gabapentin.                     -MS IR 15mg  q12 prn #60. RF today       -We will continue the controlled substance monitoring program, this consists of regular clinic visits, examinations, routine drug screening, pill counts as well as use of [place] Controlled Substance Reporting System. [place] was [place] today.  -Medication was refilled and a second prescription was sent to the patient's pharmacy for next month.          --? cervical MBB's of cervical spine        -C7-T1 ESI without benefit        -there's a definite myofascial/postural component to this        -resume outpt PT for posture, scapular rom, modalities, as well as above in #2.   4. Depression: Continue Cymbalta.   60mg  bid        -neuropsych for pain mgt techniques, coping skills has been helpful 6. Lumbar Radiculopathy:   maintain HEP, posture 7 OA of right hand:    Voltaren gel and heat therapy.              . 8. Migraines: stable, largely driven by shoulder girdle and neck pain.  See above  9. Left CTS:S/P EMG on 03/08/2017: S/P Carpal Tunnel Release on 06/14/2017 via . Dr. Apolonio Schneiders.  10. OIC: movantik           -continue diet/probiotic  Fifteen minutes of face to face patient care time were spent during this visit. All questions were encouraged and answered.  Follow up with me in 2 mos .

## 2020-06-05 NOTE — Patient Instructions (Signed)
PLEASE FEEL FREE TO CALL OUR OFFICE WITH ANY PROBLEMS OR QUESTIONS ([phone])      

## 2020-06-06 DIAGNOSIS — M542 Cervicalgia: Secondary | ICD-10-CM | POA: Diagnosis not present

## 2020-06-06 DIAGNOSIS — M5412 Radiculopathy, cervical region: Secondary | ICD-10-CM | POA: Diagnosis not present

## 2020-06-06 DIAGNOSIS — M47812 Spondylosis without myelopathy or radiculopathy, cervical region: Secondary | ICD-10-CM | POA: Diagnosis not present

## 2020-06-07 ENCOUNTER — Other Ambulatory Visit: Payer: Self-pay | Admitting: Registered Nurse

## 2020-06-07 DIAGNOSIS — M797 Fibromyalgia: Secondary | ICD-10-CM

## 2020-06-19 ENCOUNTER — Other Ambulatory Visit: Payer: Self-pay

## 2020-06-19 ENCOUNTER — Encounter: Payer: Self-pay | Admitting: Psychology

## 2020-06-19 ENCOUNTER — Ambulatory Visit
Admission: RE | Admit: 2020-06-19 | Discharge: 2020-06-19 | Disposition: A | Discharge status: Home / Self Care | Payer: Medicare HMO | Source: Ambulatory Visit | Attending: Family Medicine | Admitting: Family Medicine

## 2020-06-19 DIAGNOSIS — Z1231 Encounter for screening mammogram for malignant neoplasm of breast: Secondary | ICD-10-CM | POA: Diagnosis not present

## 2020-06-19 NOTE — Progress Notes (Signed)
Neuropsychology Visit  Patient:  Robin Morgan   DOB: 06-Feb-1956  MR Number: [id number]  Location: [place] [facility] [place], [place] [id number] [place] [id number] Dept: [phone]  Date of Service: 06/01/2020  Start: 3 PM End: 4 PM  Duration of Service: 1 Hour  Today's visit was an in person visit that was conducted in my outpatient clinic office.  The patient myself were present.  Provider/Observer:     Edgardo Roys PsyD  Chief Complaint:      Chief Complaint  Patient presents with  . Pain  . Back Pain  . Neck Pain  . Anxiety  . Depression    Reason For Service:     Robin Morgan is a 64 year old right-handed female referred by Dr. Naaman Plummer for neuropsychological/psychological consultation due to significant depression and anxiety with primary stressors related to family issues in the setting of fibromyalgia and chronic severe pain symptoms.  The patient reports that she has had significant pain in her back primarily mid back for 20 years or more.  The patient is also been diagnosed with fibromyalgia.  The patient had surgery on her neck to try to alleviate help with her severe recurrent headaches but experience little change post surgery.  The patient also has significant shoulder pain and severe headache with these pain and headaches resulting in her having to spend multiple days at a time essentially in bed.  Treatment Interventions:  Therapeutic interventions for issues related to chronic pain/fibromyalgia and recurrent headaches and depression anxiety symptoms with significant psychosocial stressors.  Participation Level:   Active  Participation Quality:  Appropriate and Attentive      Behavioral Observation:  Well Groomed, Alert, and Appropriate.   Current Psychosocial Factors: The patient continues to have significant psychosocial stressors with  particular stresses related to her older sister and the forced interaction they have around the care of their father who is in decreasing health and need significant assistance.  These difficulties started with the care of their mother that Alzheimer's was now passed away.  The sister is very controlling and when the patient and her middle sister attempted do things the younger sister becomes very controlling and manipulative.  Content of Session:   Reviewed current symptoms and continue to develop coping skills around her chronic pain and fibromyalgia symptoms as well as headaches and dealing with cervical postlaminectomy syndrome.  We also worked on significant psychosocial stressors that are being impacted by the patient's limited physical functioning as well as her stressors having a significant deleterious on her overall pain symptoms.  Effectiveness of Interventions: The patient has been quite open and active in these therapeutic processes and rapport been easily established.  The patient's cognitive function is good and while stress affects her ability to stay focused this is not indicative of any type of cognitive deficits with the patient.  The patient is already working on some of the initial issues we have focused around sleep hygiene, physical activity and other coping issues around her chronic pain symptoms.  Target Goals:   Goals include building better coping and adaptive skills around her chronic pain symptoms and working on better pain management as well as working on significant psychosocial stressors that are negatively impacting her current medical issues.  Goals Last Reviewed:   1020 10/2019  Goals Addressed Today:    Today we worked a lot on the various stressors in her life  that are exacerbating her chronic pain symptoms.  Impression/Diagnosis:   Robin Morgan is a 64 year old right-handed female referred by Dr. Naaman Plummer for neuropsychological/psychological consultation due to  significant depression and anxiety with primary stressors related to family issues in the setting of fibromyalgia and chronic severe pain symptoms.  The patient reports that she has had significant pain in her back primarily mid back for 20 years or more.  The patient is also been diagnosed with fibromyalgia.  The patient had surgery on her neck to try to alleviate help with her severe recurrent headaches but experience little change post surgery.  The patient also has significant shoulder pain and severe headache with these pain and headaches resulting in her having to spend multiple days at a time essentially in bed.  I do think that the patient's anxiety, stress and depressive symptoms do play and exacerbating role in her overall pain symptoms and while she clearly has abnormalities in lumbar and cervical regions as a primary factor for her pain symptoms her stress responses, fibromyalgia and depression do play a role in the acute day-to-day level of her pain.  Diagnosis:   Fibromyalgia  Chronic pain syndrome  Depression, recurrent (HCC)  Generalized anxiety disorder  Cervical post-laminectomy syndrome  Chronic pain associated with significant psychosocial dysfunction    Robin Morgan, Psy.D. Clinical Psychologist Neuropsychologist

## 2020-06-20 DIAGNOSIS — Z6826 Body mass index (BMI) 26.0-26.9, adult: Secondary | ICD-10-CM | POA: Diagnosis not present

## 2020-06-20 DIAGNOSIS — M47812 Spondylosis without myelopathy or radiculopathy, cervical region: Secondary | ICD-10-CM | POA: Diagnosis not present

## 2020-06-20 DIAGNOSIS — R030 Elevated blood-pressure reading, without diagnosis of hypertension: Secondary | ICD-10-CM | POA: Diagnosis not present

## 2020-06-26 ENCOUNTER — Other Ambulatory Visit: Payer: Self-pay | Admitting: Family Medicine

## 2020-06-26 ENCOUNTER — Telehealth: Payer: Self-pay

## 2020-06-26 NOTE — Telephone Encounter (Signed)
The pharmacy called and stated they needed a new lisinopril (ZESTRIL) 10 MG table prescription sent in but it should be for 5mg .

## 2020-06-26 NOTE — Telephone Encounter (Signed)
Last refill: 12/01/19 #30, 5 Last OV: 05/27/20 dx. CPE

## 2020-06-27 ENCOUNTER — Other Ambulatory Visit: Payer: Self-pay

## 2020-06-27 MED ORDER — LISINOPRIL 10 MG PO TABS
5.0000 mg | ORAL_TABLET | Freq: Every day | ORAL | 3 refills | Status: DC
Start: 2020-06-27 — End: 2021-07-18

## 2020-06-27 NOTE — Telephone Encounter (Signed)
Script sent to pharmacy.

## 2020-07-15 DIAGNOSIS — M47812 Spondylosis without myelopathy or radiculopathy, cervical region: Secondary | ICD-10-CM | POA: Diagnosis not present

## 2020-07-30 ENCOUNTER — Ambulatory Visit: Payer: Medicare HMO | Admitting: Physical Therapy

## 2020-07-31 ENCOUNTER — Telehealth: Payer: Self-pay

## 2020-07-31 ENCOUNTER — Other Ambulatory Visit: Payer: Self-pay

## 2020-07-31 ENCOUNTER — Encounter: Payer: Medicare HMO | Attending: Physical Medicine and Rehabilitation | Admitting: Physical Medicine & Rehabilitation

## 2020-07-31 ENCOUNTER — Encounter: Payer: Self-pay | Admitting: Physical Medicine & Rehabilitation

## 2020-07-31 VITALS — BP 133/79 | HR 95 | Temp 98.9°F | Ht 61.0 in | Wt 139.2 lb

## 2020-07-31 DIAGNOSIS — M797 Fibromyalgia: Secondary | ICD-10-CM

## 2020-07-31 DIAGNOSIS — M5010 Cervical disc disorder with radiculopathy, unspecified cervical region: Secondary | ICD-10-CM | POA: Insufficient documentation

## 2020-07-31 DIAGNOSIS — G43019 Migraine without aura, intractable, without status migrainosus: Secondary | ICD-10-CM | POA: Insufficient documentation

## 2020-07-31 MED ORDER — TOPIRAMATE 25 MG PO TABS: 25.0000 mg | ORAL_TABLET | Freq: Every day | ORAL | 2 refills | Status: DC

## 2020-07-31 NOTE — Telephone Encounter (Signed)
Patient called stating that she has been put on a new med for migraine to take at night. Can she take Ambien still at night or does she need to discontinue? Please advise

## 2020-07-31 NOTE — Progress Notes (Signed)
Subjective:    Patient ID: Robin Morgan, female    DOB: 24-Dec-1955, 64 y.o.   MRN: [id number]  HPI   Robin Morgan is here in follow-up of her fibromyalgia and multiple pain issues.  She has been seen by [facility] in their new anesthesiologist.  She had another injection in her cervical spine 2 weeks ago which seems to have provided more relief than the last injection.  She is not exactly sure what was done.  It was a fluoroscopy guided injection of some sort.  I have not received a copy of the procedure yet here at the office.  She still has some pain into the lower neck and occipital portion of the head.  Some of the shooting pain however into her arms appears better.  She denies any weakness or numbness in either limb.  We talked a bit about her migraines today she still having severe migraine headaches.  We have not addressed this pacifically in the past given her other problems.  She is interested in taking something for migraine relief.  She is on gabapentin for fibromyalgia symptoms and neuropathic pain.  She remains on morphine sulfate immediate release as needed for her generalized pain in her back and neck.  Pain Inventory Average Pain 9 Pain Right Now 9 My pain is constant and aching  In the last 24 hours, has pain interfered with the following? General activity 8 Relation with others 8 Enjoyment of life 9 What TIME of day is your pain at its worst? morning , daytime, evening and night Sleep (in general) Fair  Pain is worse with: sitting and standing Pain improves with: medication Relief from Meds: 8  Family History  Problem Relation Age of Onset  . Alzheimer's disease Mother   . Diabetes Father   . Heart disease Maternal Grandmother   . Breast cancer Paternal Grandmother 57   Social History   Socioeconomic History  . Marital status: Divorced    Spouse name: Not on file  . Number of children: 1  . Years of education: Not on file  .  Highest education level: Not on file  Occupational History  . Occupation: disabled  Tobacco Use  . Smoking status: Never Smoker  . Smokeless tobacco: Never Used  Vaping Use  . Vaping Use: Never used  Substance and Sexual Activity  . Alcohol use: No  . Drug use: No  . Sexual activity: Not Currently  Other Topics Concern  . Not on file  Social History Narrative  . Not on file   Social Determinants of Health   Financial Resource Strain: Not on file  Food Insecurity: Not on file  Transportation Needs: Not on file  Physical Activity: Not on file  Stress: Not on file  Social Connections: Not on file   Past Surgical History:  Procedure Laterality Date  . ABDOMINAL HYSTERECTOMY     1986  . APPENDECTOMY     1983  . BREAST EXCISIONAL BIOPSY Right 1990's  . BREAST SURGERY     rt breast cyst done in the 90's  . CHOLECYSTECTOMY    . GALLBLADDER SURGERY     1983  . ROTATOR CUFF REPAIR     right  . SPINE SURGERY    . TONSILLECTOMY     1973   Past Surgical History:  Procedure Laterality Date  . ABDOMINAL HYSTERECTOMY     1986  . APPENDECTOMY     1983  . BREAST EXCISIONAL BIOPSY Right  1990's  . BREAST SURGERY     rt breast cyst done in the 90's  . CHOLECYSTECTOMY    . GALLBLADDER SURGERY     1983  . ROTATOR CUFF REPAIR     right  . SPINE SURGERY    . TONSILLECTOMY     1973   Past Medical History:  Diagnosis Date  . Calcific tendonitis   . Cervical spondylosis without myelopathy   . Depression   . Diabetes mellitus   . Esophageal stricture   . Fibromyalgia   . GERD (gastroesophageal reflux disease)   . Hiatal hernia   . Hyperlipidemia   . Hypertension    BP 133/79   Pulse 95   Temp 98.9 F (37.2 C)   Ht 5\' 1"  (1.549 m)   Wt 139 lb 3.2 oz (63.1 kg)   SpO2 94%   BMI 26.30 kg/m   Opioid Risk Score:   Fall Risk Score:  `1  Depression screen PHQ 2/9  Depression screen [facility] 2/9 02/19/2020 12/06/2019 12/01/2019 09/27/2019 06/16/2018 03/07/2018 02/08/2018   Decreased Interest 3 3 2 3  0 1 0  Down, Depressed, Hopeless 3 3 1 3  0 1 0  PHQ - 2 Score 6 6 3 6  0 2 0  Altered sleeping 3 - 2 - 0 - 0  Tired, decreased energy 3 - 3 - 0 - 0  Change in appetite 3 - 2 - 0 - 0  Feeling bad or failure about yourself  1 - 3 - 0 - 0  Trouble concentrating 3 - 2 - 0 - 0  Moving slowly or fidgety/restless 0 - 1 - 0 - 0  Suicidal thoughts 0 - 0 - 0 - 0  PHQ-9 Score 19 - 16 - 0 - 0  Difficult doing work/chores Very difficult - Very difficult - Not difficult at all - Not difficult at all  Some recent data might be hidden    Review of Systems  Musculoskeletal: Positive for back pain.       Shoulder pain Buttocks Leg pain  All other systems reviewed and are negative.      Objective:   Physical Exam  General: No acute distress HEENT: EOMI, oral membranes moist Cards: reg rate  Chest: normal effort Abdomen: Soft, NT, ND Skin: dry, intact Extremities: no edema Neuro:  Patient is cognitively appropriate.  Neuro cranial nerve exam.  Normal insight and awareness.  Strength is grossly 4 out of 5 in both upper extremities proximal distal.  Reflexes are 1-2+.  Sensory exam grossly intact. Musculoskeletal:   Continues have some tightness in the trapezius muscles once again was rhomboids to a lesser extent..  Head forward posture.. + impingement sign bilaterally  L>R Psych:   Very pleasant.            Assessment & Plan:     1. Fibromyalgia: Continue HEp as possible. Encouraged outdoor activities.   2. Rotator cuff syndrome bilaterally.            -HEP             3. Cervicalgia. Post-laminectomy syndrome, facet arthropathy:  Cervical Radiculopathy: Continue Gabapentin.                     -MS IR 15mg  q12 prn #60. RF today       -NO MED refills today        --? cervical MBB's of cervical spine        -C7-T1 ESI  without beneift. Repeat injections seem to have helped. I am unclear as to where injection was performed. ?transforaminal ESI  -may need  surgical fusion eventually        -there's a definite myofascial/postural component to this         -continue outpt therapies 4. Depression: Continue Cymbalta.   60mg  bid        -continue neuropsych for pain mgt techniques, coping skills has been helpful 6. Lumbar Radiculopathy: continue HEP 7 OA of right hand:    Voltaren gel and heat therapy.              . 8. Migraines: stable, largely driven by shoulder girdle and neck pain.  -trial of topamax 25mg  qhs to start 9. Left CTS:S/P EMG on 03/08/2017: S/P Carpal Tunnel Release on 06/14/2017 via . Dr. Apolonio Schneiders.  10. OIC: movantik           -continue diet/probiotic

## 2020-07-31 NOTE — Patient Instructions (Addendum)
CALL ME IN 2 WEEKS IF TOPAMAX HASN'T MADE A SIGNIFICANT DIFFERENCE IN YOUR MIGRAINES.    PLEASE FEEL FREE TO CALL OUR OFFICE WITH ANY PROBLEMS OR QUESTIONS ([phone])                                @                 @@               @@@                        @@@@                      @@@@@         @@@@@@                  @@@@@@@                [id number]              @@@@@@@@@             @@@@@@@@@@       IIII                  IIII                                                        HAPPY HOLIDAYS!!!!!

## 2020-08-06 ENCOUNTER — Encounter: Payer: Medicare HMO | Admitting: Physical Therapy

## 2020-08-06 ENCOUNTER — Ambulatory Visit: Payer: Medicare HMO | Attending: Physical Medicine & Rehabilitation | Admitting: Physical Therapy

## 2020-08-13 ENCOUNTER — Encounter: Payer: Medicare HMO | Admitting: Physical Therapy

## 2020-08-15 ENCOUNTER — Encounter: Payer: Medicare HMO | Admitting: Physical Therapy

## 2020-08-19 ENCOUNTER — Other Ambulatory Visit: Payer: Self-pay | Admitting: Family Medicine

## 2020-08-19 ENCOUNTER — Telehealth: Payer: Self-pay

## 2020-08-19 ENCOUNTER — Encounter: Payer: Medicare HMO | Admitting: Physical Therapy

## 2020-08-19 NOTE — Telephone Encounter (Signed)
Call back ph [phone]  Patient cannot get in to see you until 10/09/2020. Per patient her pain is so bad she can hardly walk. Its getting worse. The chronic pain is a level 9/10 all over the body.  Having to be the main caretaker of her Father has added to the pain problem. She has requested a call back to speak with you.   Please advise. Thank you,

## 2020-08-20 MED ORDER — METHYLPREDNISOLONE 4 MG PO TBPK: ORAL_TABLET | ORAL | 0 refills | Status: DC

## 2020-08-20 NOTE — Telephone Encounter (Signed)
Called and spoke with Robin Morgan. Wrote for medrol dose pack. Also needs to rest.   Don't recommend that kind of physical labor in the future given her multiple issues. She needs to speak with family about other options for her father's care.   Pt understood and appreciative.

## 2020-08-21 ENCOUNTER — Encounter: Payer: Medicare HMO | Admitting: Physical Therapy

## 2020-08-23 ENCOUNTER — Telehealth: Payer: Self-pay | Admitting: Family Medicine

## 2020-08-23 NOTE — Telephone Encounter (Signed)
Encounter opened in error

## 2020-08-26 ENCOUNTER — Encounter: Payer: Medicare HMO | Admitting: Physical Therapy

## 2020-08-28 ENCOUNTER — Ambulatory Visit: Payer: Medicare HMO | Admitting: Family Medicine

## 2020-08-29 ENCOUNTER — Encounter: Payer: Medicare HMO | Admitting: Physical Therapy

## 2020-08-29 ENCOUNTER — Ambulatory Visit: Payer: Medicare HMO | Admitting: Psychology

## 2020-09-02 ENCOUNTER — Other Ambulatory Visit: Payer: Medicare HMO

## 2020-09-02 ENCOUNTER — Encounter: Payer: Medicare HMO | Admitting: Physical Therapy

## 2020-09-02 ENCOUNTER — Other Ambulatory Visit: Payer: Self-pay

## 2020-09-02 DIAGNOSIS — Z1152 Encounter for screening for COVID-19: Secondary | ICD-10-CM

## 2020-09-03 ENCOUNTER — Ambulatory Visit: Payer: Medicare HMO | Admitting: Family Medicine

## 2020-09-03 LAB — SARS-COV-2, NAA 2 DAY TAT

## 2020-09-03 LAB — NOVEL CORONAVIRUS, NAA: SARS-CoV-2, NAA: NOT DETECTED

## 2020-09-04 ENCOUNTER — Telehealth: Payer: Self-pay

## 2020-09-04 NOTE — Telephone Encounter (Signed)
Robin Morgan wanted to know if you create a letter stating her limitations? For her family to understand she can not do certain activities.   Please advise. Thank you Call back ph [phone].

## 2020-09-04 NOTE — Telephone Encounter (Signed)
I wrote her a brief note

## 2020-09-05 ENCOUNTER — Encounter: Payer: Medicare HMO | Admitting: Physical Therapy

## 2020-09-05 ENCOUNTER — Other Ambulatory Visit: Payer: Self-pay

## 2020-09-05 ENCOUNTER — Encounter: Payer: Self-pay | Admitting: Family Medicine

## 2020-09-05 ENCOUNTER — Ambulatory Visit (INDEPENDENT_AMBULATORY_CARE_PROVIDER_SITE_OTHER): Payer: Medicare HMO | Admitting: Family Medicine

## 2020-09-05 VITALS — BP 118/70 | HR 99 | Temp 97.6°F | Ht 61.0 in | Wt 137.6 lb

## 2020-09-05 DIAGNOSIS — J069 Acute upper respiratory infection, unspecified: Secondary | ICD-10-CM

## 2020-09-05 DIAGNOSIS — J0100 Acute maxillary sinusitis, unspecified: Secondary | ICD-10-CM | POA: Diagnosis not present

## 2020-09-05 LAB — POCT INFLUENZA A/B
Influenza A, POC: NEGATIVE
Influenza B, POC: NEGATIVE

## 2020-09-05 MED ORDER — AMOXICILLIN-POT CLAVULANATE 875-125 MG PO TABS: 1.0000 | ORAL_TABLET | Freq: Two times a day (BID) | ORAL | 0 refills | Status: DC

## 2020-09-05 NOTE — Telephone Encounter (Signed)
Letter at front desk for letter pick-up. Message left on patient voicemail.

## 2020-09-05 NOTE — Progress Notes (Signed)
Patient: Robin Morgan MRN: [id number] DOB: [birth year] PCP: Leamon Arnt, MD     Subjective:  Chief Complaint  Patient presents with  . URI    Starting last week.   . Cough    HPI: The patient is a 65 y.o. female who presents today for URI/cough starting last week. She states she has "terrible stuff in her nose", she has a cough and she keeps getting a headache. She has intermittent chills. She has no recorded fevers. She has been triple vaccinated. She was tested for covid 3 days ago with negative PCR. She was around a lady that was exposed to covid, but she tested negative. She has no shortness of breath or wheezing. Her cough is dry in nature. Her nose can be dry and then have green mucous. Her dad is very feeble and she doesn't want to be around him  In case she is sick. She hasn't taken anything over the counter. She has tenderness in her sinuses and it hurts to wear reading glasses. Also has pain in her teeth.   Review of Systems  Constitutional: Negative for chills and fever.  HENT: Positive for congestion, sinus pressure and sinus pain. Negative for sore throat.   Neurological: Positive for headaches. Negative for dizziness.    Allergies Patient is allergic to compazine, prochlorperazine, and naproxen.  Past Medical History Patient  has a past medical history of Calcific tendonitis, Cervical spondylosis without myelopathy, Depression, Diabetes mellitus, Esophageal stricture, Fibromyalgia, GERD (gastroesophageal reflux disease), Hiatal hernia, Hyperlipidemia, and Hypertension.  Surgical History Patient  has a past surgical history that includes Spine surgery; Cholecystectomy; Tonsillectomy; Rotator cuff repair; Breast excisional biopsy (Right, 1990's); Gallbladder surgery; Breast surgery; Appendectomy; and Abdominal hysterectomy.  Family History Pateint's family history includes Alzheimer's disease in her mother; Breast cancer (age of onset: 66) in her paternal  grandmother; Diabetes in her father; Heart disease in her maternal grandmother.  Social History Patient  reports that she has never smoked. She has never used smokeless tobacco. She reports that she does not drink alcohol and does not use drugs.    Objective: Vitals:   09/05/20 1315  BP: 118/70  Pulse: 99  Temp: 97.6 F (36.4 C)  TempSrc: Temporal  SpO2: 96%  Weight: 137 lb 9.6 oz (62.4 kg)  Height: 5\' 1"  (1.549 m)    Body mass index is 26 kg/m.  Physical Exam Vitals reviewed.  Constitutional:      Appearance: Normal appearance. She is well-developed and well-nourished. She is obese.  HENT:     Head: Normocephalic and atraumatic.     Comments: TTP over bilateral maxillary sinuses     Right Ear: Tympanic membrane, ear canal and external ear normal.     Left Ear: Tympanic membrane, ear canal and external ear normal.     Nose: Congestion and rhinorrhea present.     Mouth/Throat:     Mouth: Oropharynx is clear and moist.  Eyes:     Extraocular Movements: Extraocular movements intact and EOM normal.     Conjunctiva/sclera: Conjunctivae normal.     Pupils: Pupils are equal, round, and reactive to light.  Neck:     Thyroid: No thyromegaly.  Cardiovascular:     Rate and Rhythm: Normal rate and regular rhythm.     Pulses: Intact distal pulses.     Heart sounds: Normal heart sounds. No murmur heard.   Pulmonary:     Effort: Pulmonary effort is normal.     Breath sounds: Normal  breath sounds.  Abdominal:     General: Bowel sounds are normal. There is no distension.     Palpations: Abdomen is soft.     Tenderness: There is no abdominal tenderness.  Musculoskeletal:     Cervical back: Normal range of motion and neck supple.  Lymphadenopathy:     Cervical: No cervical adenopathy.  Skin:    General: Skin is warm and dry.     Capillary Refill: Capillary refill takes less than 2 seconds.     Findings: No rash.  Neurological:     General: No focal deficit present.      Mental Status: She is alert and oriented to person, place, and time.     Cranial Nerves: No cranial nerve deficit.     Coordination: Coordination normal.     Deep Tendon Reflexes: Reflexes normal.  Psychiatric:        Mood and Affect: Mood and affect and mood normal.        Behavior: Behavior normal.   flu: negative      Assessment/plan: 1. URI, acute Symptoms do sound typical for covid vs. Sinus infection. Testing again for covid and treating for sinus infection. conservative measures also discussed.  - Novel Coronavirus, NAA ([facility]) - POCT Influenza A/B  2. Acute non-recurrent maxillary sinusitis 10 day course of augmentin. Start flonase and recommended cool mist humidifier at night. Discussed side effects of augmentin and recommended to take with food. Let us know if not getting better.   This visit occurred during the SARS-CoV-2 public health emergency.  Safety protocols were in place, including screening questions prior to the visit, additional usage of staff PPE, and extensive cleaning of exam room while observing appropriate contact time as indicated for disinfecting solutions.     Return if symptoms worsen or fail to improve.    Orma Flaming, MD [place]   09/05/2020

## 2020-09-05 NOTE — Patient Instructions (Signed)
Sent in 10 day course of augmentin. Take with food. Can cause diarrhea, please let us know if this happens. Also would start flonase nightly and a cool mist humidifier is helpful.   reswabbing for covid as well.   So nice to meet you!  Dr. Rogers Blocker   Sinusitis, Adult Sinusitis is soreness and swelling (inflammation) of your sinuses. Sinuses are hollow spaces in the bones around your face. They are located:  Around your eyes.  In the middle of your forehead.  Behind your nose.  In your cheekbones. Your sinuses and nasal passages are lined with a fluid called mucus. Mucus drains out of your sinuses. Swelling can trap mucus in your sinuses. This lets germs (bacteria, virus, or fungus) grow, which leads to infection. Most of the time, this condition is caused by a virus. What are the causes? This condition is caused by:  Allergies.  Asthma.  Germs.  Things that block your nose or sinuses.  Growths in the nose (nasal polyps).  Chemicals or irritants in the air.  Fungus (rare). What increases the risk? You are more likely to develop this condition if:  You have a weak body defense system (immune system).  You do a lot of swimming or diving.  You use nasal sprays too much.  You smoke. What are the signs or symptoms? The main symptoms of this condition are pain and a feeling of pressure around the sinuses. Other symptoms include:  Stuffy nose (congestion).  Runny nose (drainage).  Swelling and warmth in the sinuses.  Headache.  Toothache.  A cough that may get worse at night.  Mucus that collects in the throat or the back of the nose (postnasal drip).  Being unable to smell and taste.  Being very tired (fatigue).  A fever.  Sore throat.  Bad breath. How is this diagnosed? This condition is diagnosed based on:  Your symptoms.  Your medical history.  A physical exam.  Tests to find out if your condition is short-term (acute) or long-term (chronic).  Your doctor may: ? Check your nose for growths (polyps). ? Check your sinuses using a tool that has a light (endoscope). ? Check for allergies or germs. ? Do imaging tests, such as an MRI or CT scan. How is this treated? Treatment for this condition depends on the cause and whether it is short-term or long-term.  If caused by a virus, your symptoms should go away on their own within 10 days. You may be given medicines to relieve symptoms. They include: ? Medicines that shrink swollen tissue in the nose. ? Medicines that treat allergies (antihistamines). ? A spray that treats swelling of the nostrils. ? Rinses that help get rid of thick mucus in your nose (nasal saline washes).  If caused by bacteria, your doctor may wait to see if you will get better without treatment. You may be given antibiotic medicine if you have: ? A very bad infection. ? A weak body defense system.  If caused by growths in the nose, you may need to have surgery. Follow these instructions at home: Medicines  Take, use, or apply over-the-counter and prescription medicines only as told by your doctor. These may include nasal sprays.  If you were prescribed an antibiotic medicine, take it as told by your doctor. Do not stop taking the antibiotic even if you start to feel better. Hydrate and humidify  Drink enough water to keep your pee (urine) pale yellow.  Use a cool mist humidifier to  keep the humidity level in your home above 50%.  Breathe in steam for 10-15 minutes, 3-4 times a day, or as told by your doctor. You can do this in the bathroom while a hot shower is running.  Try not to spend time in cool or dry air.   Rest  Rest as much as you can.  Sleep with your head raised (elevated).  Make sure you get enough sleep each night. General instructions  Put a warm, moist washcloth on your face 3-4 times a day, or as often as told by your doctor. This will help with discomfort.  Wash your hands often  with soap and water. If there is no soap and water, use hand sanitizer.  Do not smoke. Avoid being around people who are smoking (secondhand smoke).  Keep all follow-up visits as told by your doctor. This is important.   Contact a doctor if:  You have a fever.  Your symptoms get worse.  Your symptoms do not get better within 10 days. Get help right away if:  You have a very bad headache.  You cannot stop throwing up (vomiting).  You have very bad pain or swelling around your face or eyes.  You have trouble seeing.  You feel confused.  Your neck is stiff.  You have trouble breathing. Summary  Sinusitis is swelling of your sinuses. Sinuses are hollow spaces in the bones around your face.  This condition is caused by tissues in your nose that become inflamed or swollen. This traps germs. These can lead to infection.  If you were prescribed an antibiotic medicine, take it as told by your doctor. Do not stop taking it even if you start to feel better.  Keep all follow-up visits as told by your doctor. This is important. This information is not intended to replace advice given to you by your health care provider. Make sure you discuss any questions you have with your health care provider. Document Revised: 12/27/2017 Document Reviewed: 12/27/2017 Elsevier Patient Education  2021 [employer].

## 2020-09-06 LAB — NOVEL CORONAVIRUS, NAA: SARS-CoV-2, NAA: NOT DETECTED

## 2020-09-06 LAB — SARS-COV-2, NAA 2 DAY TAT

## 2020-09-09 ENCOUNTER — Encounter: Payer: Medicare HMO | Admitting: Physical Therapy

## 2020-09-12 ENCOUNTER — Encounter: Payer: Medicare HMO | Admitting: Physical Therapy

## 2020-09-16 ENCOUNTER — Other Ambulatory Visit: Payer: Self-pay

## 2020-09-16 ENCOUNTER — Other Ambulatory Visit: Payer: Medicare HMO

## 2020-09-16 ENCOUNTER — Encounter: Payer: Self-pay | Admitting: Psychology

## 2020-09-16 ENCOUNTER — Encounter: Payer: Medicare HMO | Attending: Physical Medicine and Rehabilitation | Admitting: Psychology

## 2020-09-16 DIAGNOSIS — M797 Fibromyalgia: Secondary | ICD-10-CM | POA: Diagnosis not present

## 2020-09-16 DIAGNOSIS — F339 Major depressive disorder, recurrent, unspecified: Secondary | ICD-10-CM | POA: Insufficient documentation

## 2020-09-16 DIAGNOSIS — R69 Illness, unspecified: Secondary | ICD-10-CM | POA: Diagnosis not present

## 2020-09-16 DIAGNOSIS — G43019 Migraine without aura, intractable, without status migrainosus: Secondary | ICD-10-CM | POA: Diagnosis not present

## 2020-09-16 DIAGNOSIS — M961 Postlaminectomy syndrome, not elsewhere classified: Secondary | ICD-10-CM | POA: Diagnosis not present

## 2020-09-16 DIAGNOSIS — M5010 Cervical disc disorder with radiculopathy, unspecified cervical region: Secondary | ICD-10-CM

## 2020-09-16 DIAGNOSIS — G894 Chronic pain syndrome: Secondary | ICD-10-CM

## 2020-09-16 DIAGNOSIS — F411 Generalized anxiety disorder: Secondary | ICD-10-CM | POA: Diagnosis not present

## 2020-09-16 NOTE — Progress Notes (Signed)
Neuropsychology Visit  Patient:  Robin Morgan   DOB: [birth year]  MR Number: [id number]  Location: [facility] [address] [facility] PHYSICAL MEDICINE AND REHABILITATION [place], [place] [id number] [place] [id number] Dept: [phone]  Date of Service: 09/16/2020  Start: 1:45 PM End: 2:45 PM  Duration of Service: 1 Hour  Today's visit was conducted in my outpatient clinic office with the patient myself present.  It was an in person visit.  Provider/Observer:     Edgardo Roys PsyD  Chief Complaint:      Chief Complaint  Patient presents with  . Pain  . Back Pain  . Neck Pain  . Depression  . Anxiety    Reason For Service:     Dhana Totton. Vanacker is a 65 year old right-handed female referred by Dr. Naaman Plummer for neuropsychological/psychological consultation due to significant depression and anxiety with primary stressors related to family issues in the setting of fibromyalgia and chronic severe pain symptoms.  The patient reports that she has had significant pain in her back primarily mid back for 20 years or more.  The patient is also been diagnosed with fibromyalgia.  The patient had surgery on her neck to try to alleviate help with her severe recurrent headaches but experience little change post surgery.  The patient also has significant shoulder pain and severe headache with these pain and headaches resulting in her having to spend multiple days at a time essentially in bed.  The patient is continued to struggle with anxiety and depressive symptoms on top of her chronic pain disorder.  The patient was asked to help take care of her father who has dementia and her sisters expected her to stay there for an extended period of time.  The patient was there for almost a week experiencing increasing problems due to poor sleeping conditions and an exacerbation of her underlying chronic pain symptoms.  There continues to be stressors  and animosity between the patient and her siblings.  Treatment Interventions:  Therapeutic interventions for issues related to chronic pain/fibromyalgia and recurrent headaches and depression anxiety symptoms with significant psychosocial stressors.  Participation Level:   Active  Participation Quality:  Appropriate and Attentive      Behavioral Observation:  Well Groomed, Alert, and Appropriate.   Current Psychosocial Factors: In an apparent passive-aggressive way, the patient's sisters "selected" the patient to start spending the night at the patient's father's house for extended periods of time.  The patient attempted to help with her father as long as she could but this caused increasing pain and difficulties.  This potentially manipulative response from her sisters particularly her youngest sister was not unusual and the patient felt obligated but ultimately had significant worsening of her underlying pain symptoms.  Content of Session:   Reviewed current symptoms and continue to develop coping skills around her chronic pain and fibromyalgia symptoms as well as headaches and dealing with cervical postlaminectomy syndrome.  We also worked on significant psychosocial stressors that are being impacted by the patient's limited physical functioning as well as her stressors having a significant deleterious on her overall pain symptoms.  Effectiveness of Interventions: The patient has been quite open and active in these therapeutic processes and rapport been easily established.  The patient's cognitive function is good and while stress affects her ability to stay focused this is not indicative of any type of cognitive deficits with the patient.  The patient is already working on some of  the initial issues we have focused around sleep hygiene, physical activity and other coping issues around her chronic pain symptoms.  Target Goals:   Goals include building better coping and adaptive skills around her  chronic pain symptoms and working on better pain management as well as working on significant psychosocial stressors that are negatively impacting her current medical issues.  Goals Last Reviewed:   1020 10/2019  Goals Addressed Today:    Today we worked a lot on the various stressors in her life that are exacerbating her chronic pain symptoms.  Impression/Diagnosis:   Cristin Penaflor. Wheeless is a 65 year old right-handed female referred by Dr. Naaman Plummer for neuropsychological/psychological consultation due to significant depression and anxiety with primary stressors related to family issues in the setting of fibromyalgia and chronic severe pain symptoms.  The patient reports that she has had significant pain in her back primarily mid back for 20 years or more.  The patient is also been diagnosed with fibromyalgia.  The patient had surgery on her neck to try to alleviate help with her severe recurrent headaches but experience little change post surgery.  The patient also has significant shoulder pain and severe headache with these pain and headaches resulting in her having to spend multiple days at a time essentially in bed.  I do think that the patient's anxiety, stress and depressive symptoms do play and exacerbating role in her overall pain symptoms and while she clearly has abnormalities in lumbar and cervical regions as a primary factor for her pain symptoms her stress responses, fibromyalgia and depression do play a role in the acute day-to-day level of her pain.  Today we continue to work on therapeutic interventions and address issues around the patient's need to take care of herself for so she could be there for a longer period of time with her father versus acute extended stays to the point of the patient becoming nonfunctional physically.  Diagnosis:   Fibromyalgia  Cervical disc disorder with radiculopathy of cervical region  Migraine without aura, intractable, without status  migrainosus  Chronic pain syndrome  Depression, recurrent (HCC)  Generalized anxiety disorder  Cervical post-laminectomy syndrome  Chronic pain associated with significant psychosocial dysfunction    Ilean Skill, Psy.D. Clinical Psychologist Neuropsychologist

## 2020-09-17 ENCOUNTER — Other Ambulatory Visit: Payer: Self-pay | Admitting: Family Medicine

## 2020-09-17 ENCOUNTER — Other Ambulatory Visit: Payer: Self-pay | Admitting: Physical Medicine & Rehabilitation

## 2020-09-17 DIAGNOSIS — K5903 Drug induced constipation: Secondary | ICD-10-CM

## 2020-09-20 ENCOUNTER — Other Ambulatory Visit: Payer: Self-pay

## 2020-09-20 ENCOUNTER — Encounter: Payer: Self-pay | Admitting: Family Medicine

## 2020-09-20 ENCOUNTER — Ambulatory Visit (INDEPENDENT_AMBULATORY_CARE_PROVIDER_SITE_OTHER): Payer: Medicare HMO | Admitting: Family Medicine

## 2020-09-20 VITALS — BP 114/63 | HR 91 | Temp 98.4°F | Ht 61.0 in | Wt 133.6 lb

## 2020-09-20 DIAGNOSIS — Z794 Long term (current) use of insulin: Secondary | ICD-10-CM

## 2020-09-20 DIAGNOSIS — R0600 Dyspnea, unspecified: Secondary | ICD-10-CM

## 2020-09-20 DIAGNOSIS — E1140 Type 2 diabetes mellitus with diabetic neuropathy, unspecified: Secondary | ICD-10-CM

## 2020-09-20 DIAGNOSIS — G894 Chronic pain syndrome: Secondary | ICD-10-CM | POA: Diagnosis not present

## 2020-09-20 DIAGNOSIS — R053 Chronic cough: Secondary | ICD-10-CM | POA: Diagnosis not present

## 2020-09-20 DIAGNOSIS — R0609 Other forms of dyspnea: Secondary | ICD-10-CM

## 2020-09-20 MED ORDER — AZITHROMYCIN 250 MG PO TABS: ORAL_TABLET | ORAL | 0 refills | Status: DC

## 2020-09-20 MED ORDER — PREDNISONE 10 MG PO TABS: ORAL_TABLET | ORAL | 0 refills | Status: DC

## 2020-09-20 NOTE — Progress Notes (Signed)
Subjective  CC:  Chief Complaint  Patient presents with  . Cough  . Shortness of Breath    Patient states that when she talks the words stop and she just begin to gasp for air.   . No smell or taste  . Chills    HPI: Robin Morgan is a 65 y.o. female who presents to the office today to address the problems listed above in the chief complaint.  65 year old female with diabetes, hypertension, chronic pain, fibromyalgia presents for persistent cough and URI symptoms.  She was evaluated in the office on January 27 with Covid-like symptoms.  She was treated with a 10-day course of Augmentin.  Unfortunately, many of her symptoms persist.  She describes thick purulent nasal discharge that has thinned since taking antibiotics, however she still has decreased sense of smell and taste.  She also reports being short of breath with activity.  Even noted feeling winded with talking.  She denies pleuritic chest pain.  She is coughing.  She no longer has fevers.  No longer has body aches.  She has had 2 Covid test that were negative.  She did have her cold vaccinations.  Symptoms started right after her last vaccine.  She denies GI symptoms.  She does feel thirsty.  No nausea vomiting or diarrhea  Diabetic follow-up: This has been well controlled.  She does have neuropathy.  She would like to be rechecked again today.  She is on Farxiga 10 daily, Metformin 1000 twice daily and insulin twice daily.  She denies lows.  She feels thirsty often.  She feels her sugars have been up and down since this illness started.  Chronic pain: She reports that last month she had chronic pain flare requiring prednisone. Assessment  1. Persistent cough   2. DOE (dyspnea on exertion)   3. Controlled type 2 diabetes mellitus with diabetic neuropathy, with long-term current use of insulin ([place])   4. Chronic pain associated with significant psychosocial dysfunction      Plan   Persistent cough with dyspnea: Vitals are  stable and lung exam is clear.  Recommend chest x-ray, treating for persistent sinus infection with Z-Pak and prednisone.  She has sleep apnea but no history of pulmonary disease.  She is not wheezing.  Cough and sinus congestion could be prolonged from viral illness as well.  Follow-up if not improving.  Patient will return on Monday for chest x-ray as her lab technician is out.  Controlled diabetes with neuropathy on insulin: We will recheck A1c.  Has been well controlled.  Chronic pain: Recently required prednisone.  We are repeating prednisone.  Warned about effects on glycemic control.  Continues chronic narcotics per pain management  Follow up: 3 months for diabetes recheck 09/23/2020 for chest x-ray  Orders Placed This Encounter  Procedures  . DG Chest 2 View  . Hemoglobin A1c   Meds ordered this encounter  Medications  . azithromycin (ZITHROMAX) 250 MG tablet    Sig: Take 2 tabs today, then 1 tab daily for 4 days    Dispense:  1 each    Refill:  0  . predniSONE (DELTASONE) 10 MG tablet    Sig: Take 4 tabs qd x 2 days, 3 qd x 2 days, 2 qd x 2d, 1qd x 3 days    Dispense:  21 tablet    Refill:  0      I reviewed the patients updated PMH, FH, and SocHx.    Patient Active  Problem List   Diagnosis Date Noted  . Diabetic peripheral neuropathy associated with type 2 diabetes mellitus ([place]) 01/06/2017    Priority: High  . OSA (obstructive sleep apnea) 03/10/2016    Priority: High  . Controlled diabetes mellitus with diabetic neuropathy, with long-term current use of insulin ([place]) 10/11/2014    Priority: High  . HTN (hypertension) 10/11/2014    Priority: High  . Hyperlipidemia 10/11/2014    Priority: High  . DDD (degenerative disc disease), lumbar 01/15/2014    Priority: High  . Hypothyroidism, acquired 06/12/2013    Priority: High  . Cervical post-laminectomy syndrome 05/10/2013    Priority: High  . Chronic pain associated with significant psychosocial dysfunction  01/03/2010    Priority: High  . Esophageal stricture     Priority: Medium  . Tenosynovitis, wrist 10/07/2017    Priority: Medium  . Trigger thumb of left hand 10/07/2017    Priority: Medium  . Laryngopharyngeal reflux (LPR) 08/26/2017    Priority: Medium  . Dysphagia 08/26/2017    Priority: Medium  . Left carpal tunnel syndrome 03/08/2017    Priority: Medium  . Fibromyalgia 02/12/2016    Priority: Medium  . Osteopenia 12/01/2012    Priority: Medium  . Chronic diarrhea 07/04/2012    Priority: Medium  . Migraine without aura, intractable, without status migrainosus 07/31/2020  . Cervical disc disorder with radiculopathy of cervical region 07/31/2020  . Biceps tendinitis, right 02/07/2020  . Mid back pain 12/06/2019  . Insomnia due to medical condition 12/01/2019  . Chronic prescription opiate use 09/02/2018  . Myofascial muscle pain 10/28/2011  . Depression, recurrent ([place]) 10/28/2011   Current Meds  Medication Sig  . atorvastatin (LIPITOR) 40 MG tablet TAKE 1 TABLET BY MOUTH ONCE DAILY  . azithromycin (ZITHROMAX) 250 MG tablet Take 2 tabs today, then 1 tab daily for 4 days  . b complex vitamins tablet Take 1 tablet by mouth daily.  . Calcium Carbonate-Vitamin D (CALCIUM-VITAMIN D) 500-200 MG-UNIT per tablet Take 1 tablet by mouth 2 (two) times daily with a meal.  . co-enzyme Q-10 30 MG capsule Take 30 mg by mouth at bedtime.   . diclofenac sodium (VOLTAREN) 1 % GEL Apply 2 g topically 4 (four) times daily. (Patient taking differently: Apply 2 g topically as needed.)  . DULoxetine (CYMBALTA) 30 MG capsule TAKE 3 CAPSULES BY MOUTH DAILY  . esomeprazole (NEXIUM) 40 MG capsule TAKE 1 CAPSULE BY MOUTH 2 TIMES DAILY BEFORE A MEAL. MORNING AND EVENING  . fenofibrate 160 MG tablet TAKE 1 TABLET BY MOUTH ONCE A DAY  . gabapentin (NEURONTIN) 300 MG capsule TAKE 2 CAPSULES BY MOUTH IN THE MORNING,2 CAPSULES AT LUNCH AND 2 CAPSULES AT BEDTIME  . gentamicin ointment (GARAMYCIN) 0.1 % Apply  1 application topically as needed.   Marland Kitchen LANTUS SOLOSTAR 100 UNIT/ML Solostar Pen Inject 35 Units into the skin 2 (two) times daily.  Marland Kitchen levothyroxine (SYNTHROID) 88 MCG tablet TAKE 1 TAB BY MOUTH ONCE DAILY. TAKE ON AN EMPTY STOMACH WITH A GLASS OF WATER ATLEAST 30-60 MINUTES BEFORE BREAKFAST  . lisinopril (ZESTRIL) 10 MG tablet Take 0.5 tablets (5 mg total) by mouth daily.  . magnesium gluconate (MAGONATE) 500 MG tablet Take 1 tablet (500 mg total) by mouth daily.  . metFORMIN (GLUCOPHAGE) 1000 MG tablet TAKE 1 TABLET BY MOUTH TWICE A DAY WITH MEALS  . morphine (MSIR) 15 MG tablet Take 1 tablet (15 mg total) by mouth 3 (three) times daily as needed for moderate  pain.  Marland Kitchen MOVANTIK 25 MG TABS tablet TAKE 1 TABLET BY MOUTH ONCE DAILY  . Omega-3 Fatty Acids (FISH OIL) 1000 MG CAPS Take 2 capsules by mouth daily.  . OPTIVE 0.5-0.9 % ophthalmic solution Place 1 drop into both eyes as needed.   . predniSONE (DELTASONE) 10 MG tablet Take 4 tabs qd x 2 days, 3 qd x 2 days, 2 qd x 2d, 1qd x 3 days  . RESTASIS 0.05 % ophthalmic emulsion Place 1 drop into both eyes daily.   Marland Kitchen tiZANidine (ZANAFLEX) 2 MG tablet TAKE 1/2 TO 1 TABLET (1-2 MG TOTAL) BY MOUTH EVERY 6 HOURS AS NEEDED FOR MUSCLE SPASMS  . topiramate (TOPAMAX) 25 MG tablet Take 1 tablet (25 mg total) by mouth at bedtime.  . triamcinolone cream (KENALOG) 0.1 % Apply 1 application topically 2 (two) times daily. As needed  . zolpidem (AMBIEN) 10 MG tablet TAKE 1 TABLET BY MOUTH AT BEDTIME AS NEEDED FOR SLEEP    Allergies: Patient is allergic to compazine, prochlorperazine, and naproxen. Family History: Patient family history includes Alzheimer's disease in her mother; Breast cancer (age of onset: 68) in her paternal grandmother; Diabetes in her father; Heart disease in her maternal grandmother. Social History:  Patient  reports that she has never smoked. She has never used smokeless tobacco. She reports that she does not drink alcohol and does not use  drugs.  Review of Systems: Constitutional: Negative for fever malaise or anorexia Cardiovascular: negative for chest pain Respiratory: negative for SOB or persistent cough Gastrointestinal: negative for abdominal pain  Objective  Vitals: BP 114/63   Pulse 91   Temp 98.4 F (36.9 C) (Temporal)   Ht 5\' 1"  (1.549 m)   Wt 133 lb 9.6 oz (60.6 kg)   SpO2 96%   BMI 25.24 kg/m  General: no acute distress , A&Ox3, nontoxic HEENT: PEERL, conjunctiva normal, neck is supple, no sinus tenderness Cardiovascular:  RRR without murmur or gallop.  Respiratory:  Good breath sounds bilaterally, CTAB with normal respiratory effort, no wheezing or rales Skin:  Warm, no rashes     Commons side effects, risks, benefits, and alternatives for medications and treatment plan prescribed today were discussed, and the patient expressed understanding of the given instructions. Patient is instructed to call or message via MyChart if he/she has any questions or concerns regarding our treatment plan. No barriers to understanding were identified. We discussed Red Flag symptoms and signs in detail. Patient expressed understanding regarding what to do in case of urgent or emergency type symptoms.   Medication list was reconciled, printed and provided to the patient in AVS. Patient instructions and summary information was reviewed with the patient as documented in the AVS. This note was prepared with assistance of Dragon voice recognition software. Occasional wrong-word or sound-a-like substitutions may have occurred due to the inherent limitations of voice recognition software  This visit occurred during the SARS-CoV-2 public health emergency.  Safety protocols were in place, including screening questions prior to the visit, additional usage of staff PPE, and extensive cleaning of exam room while observing appropriate contact time as indicated for disinfecting solutions.

## 2020-09-20 NOTE — Patient Instructions (Addendum)
Please return in 3 months for diabetes recheck. Please return on Monday for a chest xray.   Let's try another round of antibiotics and a prednisone taper to see if we can get your symptoms calmed down.   If you have any questions or concerns, please don't hesitate to send me a message via MyChart or call the office at [phone]. Thank you for visiting with [place] today! It's our pleasure caring for you.

## 2020-09-23 ENCOUNTER — Other Ambulatory Visit: Payer: Medicare HMO

## 2020-09-23 ENCOUNTER — Ambulatory Visit (INDEPENDENT_AMBULATORY_CARE_PROVIDER_SITE_OTHER): Payer: Medicare HMO

## 2020-09-23 ENCOUNTER — Other Ambulatory Visit: Payer: Self-pay

## 2020-09-23 DIAGNOSIS — R053 Chronic cough: Secondary | ICD-10-CM

## 2020-09-23 DIAGNOSIS — R059 Cough, unspecified: Secondary | ICD-10-CM | POA: Diagnosis not present

## 2020-09-23 DIAGNOSIS — R0600 Dyspnea, unspecified: Secondary | ICD-10-CM

## 2020-09-23 DIAGNOSIS — R0609 Other forms of dyspnea: Secondary | ICD-10-CM

## 2020-09-23 LAB — HEMOGLOBIN A1C: Hgb A1c MFr Bld: 7.2 % — ABNORMAL HIGH (ref 4.6–6.5)

## 2020-10-01 ENCOUNTER — Other Ambulatory Visit: Payer: Self-pay | Admitting: Physical Medicine & Rehabilitation

## 2020-10-01 DIAGNOSIS — M7918 Myalgia, other site: Secondary | ICD-10-CM

## 2020-10-01 DIAGNOSIS — M797 Fibromyalgia: Secondary | ICD-10-CM

## 2020-10-02 ENCOUNTER — Telehealth: Payer: Self-pay | Admitting: Registered Nurse

## 2020-10-02 MED ORDER — MORPHINE SULFATE 15 MG PO TABS: ORAL_TABLET | ORAL | 0 refills | Status: DC

## 2020-10-02 NOTE — Telephone Encounter (Signed)
Placed a call to Robin Morgan regarding her pharmacy Internet is still down, no answer awaiting her return call.

## 2020-10-02 NOTE — Telephone Encounter (Signed)
PMP was Reviewed: Last MSIR was filled on 07/22/2020. This provider placed a call to Robin Morgan, she states her last fill was on 07/22/2020, today she has 7 tablets. MSIR e-scribed today. She has an appointment on 10/09/2020. She verbalizes understanding.

## 2020-10-02 NOTE — Telephone Encounter (Signed)
Received a Morphine error message. Placed a call to [facility], the representative stated their Internet was down this morning. New prescription was e-scribed, today.

## 2020-10-02 NOTE — Addendum Note (Signed)
Addended by: Bayard Hugger on: 10/02/2020 12:02 PM   Modules accepted: Orders

## 2020-10-03 ENCOUNTER — Telehealth: Payer: Self-pay | Admitting: Registered Nurse

## 2020-10-03 DIAGNOSIS — M7918 Myalgia, other site: Secondary | ICD-10-CM

## 2020-10-03 DIAGNOSIS — M797 Fibromyalgia: Secondary | ICD-10-CM

## 2020-10-03 MED ORDER — MORPHINE SULFATE 15 MG PO TABS: ORAL_TABLET | ORAL | 0 refills | Status: DC

## 2020-10-03 NOTE — Telephone Encounter (Signed)
PMP was Reviewed.  [facility] Internet was down on 10/02/2020. MSIR E-scribed today. Placed a call to Ms. Amedeo Plenty , no answer. Left message regarding the above.

## 2020-10-07 ENCOUNTER — Encounter (HOSPITAL_BASED_OUTPATIENT_CLINIC_OR_DEPARTMENT_OTHER): Payer: Medicare HMO | Admitting: Psychology

## 2020-10-07 ENCOUNTER — Other Ambulatory Visit: Payer: Self-pay

## 2020-10-07 DIAGNOSIS — M797 Fibromyalgia: Secondary | ICD-10-CM

## 2020-10-07 DIAGNOSIS — G894 Chronic pain syndrome: Secondary | ICD-10-CM | POA: Diagnosis not present

## 2020-10-07 DIAGNOSIS — F339 Major depressive disorder, recurrent, unspecified: Secondary | ICD-10-CM

## 2020-10-07 DIAGNOSIS — G43019 Migraine without aura, intractable, without status migrainosus: Secondary | ICD-10-CM

## 2020-10-07 DIAGNOSIS — M5010 Cervical disc disorder with radiculopathy, unspecified cervical region: Secondary | ICD-10-CM

## 2020-10-07 DIAGNOSIS — F411 Generalized anxiety disorder: Secondary | ICD-10-CM

## 2020-10-07 DIAGNOSIS — R69 Illness, unspecified: Secondary | ICD-10-CM | POA: Diagnosis not present

## 2020-10-07 DIAGNOSIS — M7918 Myalgia, other site: Secondary | ICD-10-CM | POA: Diagnosis not present

## 2020-10-07 DIAGNOSIS — M961 Postlaminectomy syndrome, not elsewhere classified: Secondary | ICD-10-CM | POA: Diagnosis not present

## 2020-10-09 ENCOUNTER — Encounter: Payer: Self-pay | Admitting: Physical Medicine & Rehabilitation

## 2020-10-09 ENCOUNTER — Encounter: Payer: Medicare HMO | Attending: Physical Medicine and Rehabilitation | Admitting: Physical Medicine & Rehabilitation

## 2020-10-09 ENCOUNTER — Other Ambulatory Visit: Payer: Self-pay

## 2020-10-09 VITALS — BP 113/67 | HR 99 | Temp 99.3°F | Ht 61.0 in | Wt 137.8 lb

## 2020-10-09 DIAGNOSIS — M7918 Myalgia, other site: Secondary | ICD-10-CM | POA: Diagnosis not present

## 2020-10-09 DIAGNOSIS — Z79891 Long term (current) use of opiate analgesic: Secondary | ICD-10-CM | POA: Insufficient documentation

## 2020-10-09 DIAGNOSIS — M5136 Other intervertebral disc degeneration, lumbar region: Secondary | ICD-10-CM

## 2020-10-09 DIAGNOSIS — Z5181 Encounter for therapeutic drug level monitoring: Secondary | ICD-10-CM | POA: Insufficient documentation

## 2020-10-09 DIAGNOSIS — G43019 Migraine without aura, intractable, without status migrainosus: Secondary | ICD-10-CM | POA: Diagnosis not present

## 2020-10-09 DIAGNOSIS — G5602 Carpal tunnel syndrome, left upper limb: Secondary | ICD-10-CM | POA: Diagnosis not present

## 2020-10-09 DIAGNOSIS — M797 Fibromyalgia: Secondary | ICD-10-CM | POA: Diagnosis not present

## 2020-10-09 DIAGNOSIS — M5010 Cervical disc disorder with radiculopathy, unspecified cervical region: Secondary | ICD-10-CM | POA: Diagnosis not present

## 2020-10-09 DIAGNOSIS — G894 Chronic pain syndrome: Secondary | ICD-10-CM | POA: Diagnosis not present

## 2020-10-09 MED ORDER — MORPHINE SULFATE 15 MG PO TABS: ORAL_TABLET | ORAL | 0 refills | Status: DC

## 2020-10-09 MED ORDER — TOPIRAMATE 25 MG PO TABS: 25.0000 mg | ORAL_TABLET | Freq: Every day | ORAL | 4 refills | Status: DC

## 2020-10-09 NOTE — Patient Instructions (Addendum)
PLEASE FEEL FREE TO CALL OUR OFFICE WITH ANY PROBLEMS OR QUESTIONS ([phone])  WORK ON SOME SIDE STRETCHES WHICH SHOULD INCLUDE ROTATION AND LATERAL BENDING TO THE LEFT. APPLY HEAT TO AREA

## 2020-10-09 NOTE — Progress Notes (Signed)
Subjective:    Patient ID: Robin Morgan, female    DOB: [birth year], 65 y.o.   MRN: [id number]  HPI   Robin Morgan is here in follow-up of her chronic pain.  She has made some progress since I last saw her.  She has been able to deal with her family more directly regarding her capabilities to care for her father.  With the help of neuropsychology and a letter I wrote for her she has been able to tell them that she cannot keep up with the care she has been providing.  It has caused some stress at times but for the most part has been a relief for her.  Additionally the Topamax we started has helped with her headaches.  She still has a breakthrough headache fairly regularly but they are much improved.  She is interested in potentially increasing the dose if possible.  Otherwise her back and cervical symptoms are consistent with before.  She did develop a spot in her right low back which emerged recently without obvious cause.  It tends to bother her more when she bends or twists to the left side.  She remains on morphine for her baseline pain control.   Pain Inventory Average Pain 9 Pain Right Now 8 My pain is tingling and aching  In the last 24 hours, has pain interfered with the following? General activity 8 Relation with others 8 Enjoyment of life 8 What TIME of day is your pain at its worst? morning , evening and night Sleep (in general) Fair  Pain is worse with: standing and unsure Pain improves with: rest and pacing activities Relief from Meds: 8  Family History  Problem Relation Age of Onset  . Alzheimer's disease Mother   . Diabetes Father   . Heart disease Maternal Grandmother   . Breast cancer Paternal Grandmother 81   Social History   Socioeconomic History  . Marital status: Divorced    Spouse name: Not on file  . Number of children: 1  . Years of education: Not on file  . Highest education level: Not on file  Occupational History  . Occupation: disabled   Tobacco Use  . Smoking status: Never Smoker  . Smokeless tobacco: Never Used  Vaping Use  . Vaping Use: Never used  Substance and Sexual Activity  . Alcohol use: No  . Drug use: No  . Sexual activity: Not Currently  Other Topics Concern  . Not on file  Social History Narrative  . Not on file   Social Determinants of Health   Financial Resource Strain: Not on file  Food Insecurity: Not on file  Transportation Needs: Not on file  Physical Activity: Not on file  Stress: Not on file  Social Connections: Not on file   Past Surgical History:  Procedure Laterality Date  . ABDOMINAL HYSTERECTOMY     1986  . APPENDECTOMY     1983  . BREAST EXCISIONAL BIOPSY Right 1990's  . BREAST SURGERY     rt breast cyst done in the 90's  . CHOLECYSTECTOMY    . GALLBLADDER SURGERY     1983  . ROTATOR CUFF REPAIR     right  . SPINE SURGERY    . TONSILLECTOMY     1973   Past Surgical History:  Procedure Laterality Date  . ABDOMINAL HYSTERECTOMY     1986  . APPENDECTOMY     1983  . BREAST EXCISIONAL BIOPSY Right 1990's  . BREAST SURGERY  rt breast cyst done in the 90's  . CHOLECYSTECTOMY    . GALLBLADDER SURGERY     1983  . ROTATOR CUFF REPAIR     right  . SPINE SURGERY    . TONSILLECTOMY     1973   Past Medical History:  Diagnosis Date  . Calcific tendonitis   . Cervical spondylosis without myelopathy   . Depression   . Diabetes mellitus   . Esophageal stricture   . Fibromyalgia   . GERD (gastroesophageal reflux disease)   . Hiatal hernia   . Hyperlipidemia   . Hypertension    BP 113/67   Pulse 99   Temp 99.3 F (37.4 C)   Ht 5\' 1"  (1.549 m)   Wt 137 lb 12.8 oz (62.5 kg)   SpO2 93%   BMI 26.04 kg/m   Opioid Risk Score:   Fall Risk Score:  `1  Depression screen PHQ 2/9  Depression screen [facility] 2/9 09/20/2020 02/19/2020 12/06/2019 12/01/2019 09/27/2019 06/16/2018 03/07/2018  Decreased Interest 3 3 3 2 3  0 1  Down, Depressed, Hopeless 2 3 3 1 3  0 1  PHQ - 2  Score 5 6 6 3 6  0 2  Altered sleeping 3 3 - 2 - 0 -  Tired, decreased energy 3 3 - 3 - 0 -  Change in appetite 3 3 - 2 - 0 -  Feeling bad or failure about yourself  0 1 - 3 - 0 -  Trouble concentrating 0 3 - 2 - 0 -  Moving slowly or fidgety/restless 0 0 - 1 - 0 -  Suicidal thoughts 0 0 - 0 - 0 -  PHQ-9 Score 14 19 - 16 - 0 -  Difficult doing work/chores Somewhat difficult Very difficult - Very difficult - Not difficult at all -  Some recent data might be hidden     Review of Systems  Musculoskeletal: Positive for back pain and neck pain.  All other systems reviewed and are negative.      Objective:   Physical Exam  General: No acute distress HEENT: EOMI, oral membranes moist Cards: reg rate  Chest: normal effort Abdomen: Soft, NT, ND Skin: dry, intact Extremities: no edema Psych: pleasant and appropriate.  Seems in better spirits today. Neuro:  Patient is cognitively appropriate.  Neuro cranial nerve exam.  Normal insight and awareness.  Strength is grossly 4 out of 5 in both upper extremities proximal distal.  Reflexes are 1-2+.  Sensory exam grossly intact. Musculoskeletal:   right lower lat with tenderness.  head forward position better.               Assessment & Plan:     1. Fibromyalgia:  Continue home exercise program as possible  2. Rotator cuff syndrome bilaterally.            -HEP             3. Cervicalgia. Post-laminectomy syndrome, facet arthropathy:  Cervical Radiculopathy:  Continue Gabapentin.                     -MS IR 15mg  q12 prn #60. RF today  -We will continue the controlled substance monitoring program, this consists of regular clinic visits, examinations, routine drug screening, pill counts as well as use of [place] Controlled Substance Reporting System. [place] today.    -UDS today        --? cervical MBB's of cervical spine        -  C7-T1 ESI without beneift. Repeat injections seem to have helped. I am unclear as to where  injection was performed. ?transforaminal ESI             -may need surgical fusion at some point        -myofascial/postural component           HEP 4. Depression: Continue Cymbalta.   60mg  bid        -continue neuropsych for pain mgt techniques, coping skills==very helpfull 6. Lumbar Radiculopathy:  I think symptoms in her right low back are related to a muscle strain potentially involving her lower latissimus dorsi muscle.  Reviewed some basic stretching that she can perform at home  She should also apply regular heat for now. 7 OA of right hand:    Voltaren gel and heat therapy.              . 8. Migraines: stable, largely driven by shoulder girdle and neck pain.             -increase topamax  to 50mg  qhs.  Alternative dosing would be 25 mg twice daily 9. Left CTS:S/P EMG on 03/08/2017: S/P Carpal Tunnel Release on 06/14/2017 via . Dr. Apolonio Schneiders.  10. OIC: movantik           -continue diet/probiotic, bowels are fairly regular hip present  Fifteen minutes of face to face patient care time were spent during this visit. All questions were encouraged and answered.  Follow up with me in 2 mos .

## 2020-10-11 ENCOUNTER — Other Ambulatory Visit: Payer: Self-pay | Admitting: Family Medicine

## 2020-10-11 ENCOUNTER — Other Ambulatory Visit: Payer: Self-pay | Admitting: Registered Nurse

## 2020-10-11 DIAGNOSIS — M797 Fibromyalgia: Secondary | ICD-10-CM

## 2020-10-18 ENCOUNTER — Telehealth: Payer: Self-pay | Admitting: *Deleted

## 2020-10-18 ENCOUNTER — Encounter: Payer: Self-pay | Admitting: Psychology

## 2020-10-18 LAB — TOXASSURE SELECT,+ANTIDEPR,UR

## 2020-10-18 NOTE — Progress Notes (Signed)
Neuropsychology Visit  Patient:  DANISA KOPEC   DOB: 24-Jan-1956  MR Number: [id number]  Location: [facility] FOR PAIN AND [facility] MEDICINE [facility] PHYSICAL MEDICINE AND REHABILITATION [place], [place] [id number] [place] [id number] Dept: [phone]  Date of Service: 10/07/2020  Start: 1:45 PM End: 2:45 PM  Duration of Service: 1 Hour  Today's visit was conducted in my outpatient clinic office with the patient myself present.  It was an in person visit.  Provider/Observer:     Edgardo Roys PsyD  Chief Complaint:      Chief Complaint  Patient presents with  . Pain  . Back Pain  . Anxiety  . Depression  . Stress    Reason For Service:     Keira Bohlin. Salman is a 65 year old right-handed female referred by Dr. Naaman Plummer for neuropsychological/psychological consultation due to significant depression and anxiety with primary stressors related to family issues in the setting of fibromyalgia and chronic severe pain symptoms.  The patient reports that she has had significant pain in her back primarily mid back for 20 years or more.  The patient is also been diagnosed with fibromyalgia.  The patient had surgery on her neck to try to alleviate help with her severe recurrent headaches but experience little change post surgery.  The patient also has significant shoulder pain and severe headache with these pain and headaches resulting in her having to spend multiple days at a time essentially in bed.  The patient is continued to struggle with anxiety and depressive symptoms on top of her chronic pain disorder.  The patient was asked to help take care of her father who has dementia and her sisters expected her to stay there for an extended period of time.  The patient was there for almost a week experiencing increasing problems due to poor sleeping conditions and an exacerbation of her underlying chronic pain symptoms.  There continues to be stressors  and animosity between the patient and her siblings.  Treatment Interventions:  Therapeutic interventions for issues related to chronic pain/fibromyalgia and recurrent headaches and depression anxiety symptoms with significant psychosocial stressors.  Participation Level:   Active  Participation Quality:  Appropriate and Attentive      Behavioral Observation:  Well Groomed, Alert, and Appropriate.   Current Psychosocial Factors: In an apparent passive-aggressive way, the patient's sisters "selected" the patient to start spending the night at the patient's father's house for extended periods of time.  The patient attempted to help with her father as long as she could but this caused increasing pain and difficulties.  This potentially manipulative response from her sisters particularly her youngest sister was not unusual and the patient felt obligated but ultimately had significant worsening of her underlying pain symptoms.  Content of Session:   Reviewed current symptoms and continue to develop coping skills around her chronic pain and fibromyalgia symptoms as well as headaches and dealing with cervical postlaminectomy syndrome.  We also worked on significant psychosocial stressors that are being impacted by the patient's limited physical functioning as well as her stressors having a significant deleterious on her overall pain symptoms.  Effectiveness of Interventions: The patient has been quite open and active in these therapeutic processes and rapport been easily established.  The patient's cognitive function is good and while stress affects her ability to stay focused this is not indicative of any type of cognitive deficits with the patient.  The patient is already working on some of the  initial issues we have focused around sleep hygiene, physical activity and other coping issues around her chronic pain symptoms.  Target Goals:   Goals include building better coping and adaptive skills around her  chronic pain symptoms and working on better pain management as well as working on significant psychosocial stressors that are negatively impacting her current medical issues.  Goals Last Reviewed:   10/07/2020  Goals Addressed Today:    Today we worked a lot on the various stressors in her life that are exacerbating her chronic pain symptoms.  Impression/Diagnosis:   Sheilla Maris. Krack is a 65 year old right-handed female referred by Dr. Naaman Plummer for neuropsychological/psychological consultation due to significant depression and anxiety with primary stressors related to family issues in the setting of fibromyalgia and chronic severe pain symptoms.  The patient reports that she has had significant pain in her back primarily mid back for 20 years or more.  The patient is also been diagnosed with fibromyalgia.  The patient had surgery on her neck to try to alleviate help with her severe recurrent headaches but experience little change post surgery.  The patient also has significant shoulder pain and severe headache with these pain and headaches resulting in her having to spend multiple days at a time essentially in bed.  I do think that the patient's anxiety, stress and depressive symptoms do play and exacerbating role in her overall pain symptoms and while she clearly has abnormalities in lumbar and cervical regions as a primary factor for her pain symptoms her stress responses, fibromyalgia and depression do play a role in the acute day-to-day level of her pain.  Today we continue to work on therapeutic interventions and address issues around the patient's need to take care of herself for so she could be there for a longer period of time with her father versus acute extended stays to the point of the patient becoming nonfunctional physically.  Diagnosis:   Myofascial muscle pain  Fibromyalgia  Cervical disc disorder with radiculopathy of cervical region  Migraine without aura, intractable, without  status migrainosus  Chronic pain syndrome  Depression, recurrent (HCC)  Generalized anxiety disorder  Chronic pain associated with significant psychosocial dysfunction    Ilean Skill, Psy.D. Clinical Psychologist Neuropsychologist

## 2020-10-18 NOTE — Telephone Encounter (Signed)
Urine drug screen for this encounter is consistent for prescribed medication 

## 2020-10-22 DIAGNOSIS — H04123 Dry eye syndrome of bilateral lacrimal glands: Secondary | ICD-10-CM | POA: Diagnosis not present

## 2020-10-22 DIAGNOSIS — E113293 Type 2 diabetes mellitus with mild nonproliferative diabetic retinopathy without macular edema, bilateral: Secondary | ICD-10-CM | POA: Diagnosis not present

## 2020-10-22 DIAGNOSIS — Z961 Presence of intraocular lens: Secondary | ICD-10-CM | POA: Diagnosis not present

## 2020-10-22 LAB — HM DIABETES EYE EXAM

## 2020-10-25 DIAGNOSIS — J329 Chronic sinusitis, unspecified: Secondary | ICD-10-CM | POA: Diagnosis not present

## 2020-10-25 DIAGNOSIS — L299 Pruritus, unspecified: Secondary | ICD-10-CM | POA: Diagnosis not present

## 2020-10-31 ENCOUNTER — Other Ambulatory Visit: Payer: Self-pay | Admitting: Registered Nurse

## 2020-10-31 DIAGNOSIS — M7918 Myalgia, other site: Secondary | ICD-10-CM

## 2020-10-31 DIAGNOSIS — M797 Fibromyalgia: Secondary | ICD-10-CM

## 2020-11-08 ENCOUNTER — Other Ambulatory Visit: Payer: Self-pay | Admitting: Gastroenterology

## 2020-11-08 ENCOUNTER — Other Ambulatory Visit (HOSPITAL_COMMUNITY): Payer: Self-pay | Admitting: Gastroenterology

## 2020-11-08 DIAGNOSIS — R6881 Early satiety: Secondary | ICD-10-CM

## 2020-11-08 DIAGNOSIS — Z1211 Encounter for screening for malignant neoplasm of colon: Secondary | ICD-10-CM | POA: Diagnosis not present

## 2020-11-08 DIAGNOSIS — R1310 Dysphagia, unspecified: Secondary | ICD-10-CM | POA: Diagnosis not present

## 2020-11-08 DIAGNOSIS — R634 Abnormal weight loss: Secondary | ICD-10-CM | POA: Diagnosis not present

## 2020-11-08 DIAGNOSIS — K5903 Drug induced constipation: Secondary | ICD-10-CM | POA: Diagnosis not present

## 2020-11-12 DIAGNOSIS — J324 Chronic pansinusitis: Secondary | ICD-10-CM | POA: Diagnosis not present

## 2020-11-12 DIAGNOSIS — J0110 Acute frontal sinusitis, unspecified: Secondary | ICD-10-CM | POA: Diagnosis not present

## 2020-11-12 DIAGNOSIS — J321 Chronic frontal sinusitis: Secondary | ICD-10-CM | POA: Diagnosis not present

## 2020-11-12 DIAGNOSIS — J320 Chronic maxillary sinusitis: Secondary | ICD-10-CM | POA: Diagnosis not present

## 2020-11-12 DIAGNOSIS — J3489 Other specified disorders of nose and nasal sinuses: Secondary | ICD-10-CM | POA: Diagnosis not present

## 2020-11-13 ENCOUNTER — Ambulatory Visit (HOSPITAL_COMMUNITY)
Admission: RE | Admit: 2020-11-13 | Discharge: 2020-11-13 | Disposition: A | Discharge status: Home / Self Care | Payer: Medicare HMO | Source: Ambulatory Visit | Attending: Gastroenterology | Admitting: Gastroenterology

## 2020-11-13 ENCOUNTER — Other Ambulatory Visit: Payer: Self-pay

## 2020-11-13 ENCOUNTER — Ambulatory Visit (HOSPITAL_COMMUNITY): Admission: RE | Admit: 2020-11-13 | Payer: Medicare HMO | Source: Ambulatory Visit

## 2020-11-13 DIAGNOSIS — R6881 Early satiety: Secondary | ICD-10-CM | POA: Insufficient documentation

## 2020-11-13 MED ORDER — TECHNETIUM TC 99M SULFUR COLLOID
2.1000 | Freq: Once | INTRAVENOUS | Status: AC
  Administered 2020-11-13: 2.1 via INTRAVENOUS

## 2020-11-19 ENCOUNTER — Encounter: Payer: Self-pay | Admitting: Family Medicine

## 2020-12-05 ENCOUNTER — Other Ambulatory Visit: Payer: Self-pay | Admitting: Family Medicine

## 2020-12-06 ENCOUNTER — Other Ambulatory Visit: Payer: Self-pay

## 2020-12-06 ENCOUNTER — Ambulatory Visit (INDEPENDENT_AMBULATORY_CARE_PROVIDER_SITE_OTHER): Payer: Medicare HMO

## 2020-12-06 VITALS — BP 110/64 | HR 98 | Temp 98.0°F | Wt 132.8 lb

## 2020-12-06 DIAGNOSIS — Z0000 Encounter for general adult medical examination without abnormal findings: Secondary | ICD-10-CM

## 2020-12-06 NOTE — Patient Instructions (Addendum)
Robin Morgan , Thank you for taking time to come for your Medicare Wellness Visit. I appreciate your ongoing commitment to your health goals. Please review the following plan we discussed and let me know if I can assist you in the future.   Screening recommendations/referrals: Colonoscopy: Done 11/20/15 Mammogram: Done 06/19/20 Bone Density: Done 11/03/17 Recommended yearly ophthalmology/optometry visit for glaucoma screening and checkup Recommended yearly dental visit for hygiene and checkup  Vaccinations: Influenza vaccine: Up to date Pneumococcal vaccine: Up to date Tdap vaccine: Up to date Shingles vaccine: Shingrix discussed. Please contact your pharmacy for coverage information.    Covid-19:Completed 2/23,3/16, & 06/28/20  Advanced directives: Please bring a copy of your health care power of attorney and living will to the office at your convenience.  Conditions/risks identified: None at this time   Next appointment: Follow up in one year for your annual wellness visit    Preventive Care 65 Years and Older, Female Preventive care refers to lifestyle choices and visits with your health care provider that can promote health and wellness. What does preventive care include?  A yearly physical exam. This is also called an annual well check.  Dental exams once or twice a year.  Routine eye exams. Ask your health care provider how often you should have your eyes checked.  Personal lifestyle choices, including:  Daily care of your teeth and gums.  Regular physical activity.  Eating a healthy diet.  Avoiding tobacco and drug use.  Limiting alcohol use.  Practicing safe sex.  Taking low-dose aspirin every day.  Taking vitamin and mineral supplements as recommended by your health care provider. What happens during an annual well check? The services and screenings done by your health care provider during your annual well check will depend on your age, overall health,  lifestyle risk factors, and family history of disease. Counseling  Your health care provider may ask you questions about your:  Alcohol use.  Tobacco use.  Drug use.  Emotional well-being.  Home and relationship well-being.  Sexual activity.  Eating habits.  History of falls.  Memory and ability to understand (cognition).  Work and work Statistician.  Reproductive health. Screening  You may have the following tests or measurements:  Height, weight, and BMI.  Blood pressure.  Lipid and cholesterol levels. These may be checked every 5 years, or more frequently if you are over 82 years old.  Skin check.  Lung cancer screening. You may have this screening every year starting at age 58 if you have a 30-pack-year history of smoking and currently smoke or have quit within the past 15 years.  Fecal occult blood test (FOBT) of the stool. You may have this test every year starting at age 70.  Flexible sigmoidoscopy or colonoscopy. You may have a sigmoidoscopy every 5 years or a colonoscopy every 10 years starting at age 19.  Hepatitis C blood test.  Hepatitis B blood test.  Sexually transmitted disease (STD) testing.  Diabetes screening. This is done by checking your blood sugar (glucose) after you have not eaten for a while (fasting). You may have this done every 1-3 years.  Bone density scan. This is done to screen for osteoporosis. You may have this done starting at age 35.  Mammogram. This may be done every 1-2 years. Talk to your health care provider about how often you should have regular mammograms. Talk with your health care provider about your test results, treatment options, and if necessary, the need for more tests.  Vaccines  Your health care provider may recommend certain vaccines, such as:  Influenza vaccine. This is recommended every year.  Tetanus, diphtheria, and acellular pertussis (Tdap, Td) vaccine. You may need a Td booster every 10 years.  Zoster  vaccine. You may need this after age 24.  Pneumococcal 13-valent conjugate (PCV13) vaccine. One dose is recommended after age 47.  Pneumococcal polysaccharide (PPSV23) vaccine. One dose is recommended after age 37. Talk to your health care provider about which screenings and vaccines you need and how often you need them. This information is not intended to replace advice given to you by your health care provider. Make sure you discuss any questions you have with your health care provider. Document Released: 08/23/2015 Document Revised: 04/15/2016 Document Reviewed: 05/28/2015 Elsevier Interactive Patient Education  2017 [place] Prevention in the Home Falls can cause injuries. They can happen to people of all ages. There are many things you can do to make your home safe and to help prevent falls. What can I do on the outside of my home?  Regularly fix the edges of walkways and driveways and fix any cracks.  Remove anything that might make you trip as you walk through a door, such as a raised step or threshold.  Trim any bushes or trees on the path to your home.  Use bright outdoor lighting.  Clear any walking paths of anything that might make someone trip, such as rocks or tools.  Regularly check to see if handrails are loose or broken. Make sure that both sides of any steps have handrails.  Any raised decks and porches should have guardrails on the edges.  Have any leaves, snow, or ice cleared regularly.  Use sand or salt on walking paths during winter.  Clean up any spills in your garage right away. This includes oil or grease spills. What can I do in the bathroom?  Use night lights.  Install grab bars by the toilet and in the tub and shower. Do not use towel bars as grab bars.  Use non-skid mats or decals in the tub or shower.  If you need to sit down in the shower, use a plastic, non-slip stool.  Keep the floor dry. Clean up any water that spills on the  floor as soon as it happens.  Remove soap buildup in the tub or shower regularly.  Attach bath mats securely with double-sided non-slip rug tape.  Do not have throw rugs and other things on the floor that can make you trip. What can I do in the bedroom?  Use night lights.  Make sure that you have a light by your bed that is easy to reach.  Do not use any sheets or blankets that are too big for your bed. They should not hang down onto the floor.  Have a firm chair that has side arms. You can use this for support while you get dressed.  Do not have throw rugs and other things on the floor that can make you trip. What can I do in the kitchen?  Clean up any spills right away.  Avoid walking on wet floors.  Keep items that you use a lot in easy-to-reach places.  If you need to reach something above you, use a strong step stool that has a grab bar.  Keep electrical cords out of the way.  Do not use floor polish or wax that makes floors slippery. If you must use wax, use non-skid floor wax.  Do not have throw rugs and other things on the floor that can make you trip. What can I do with my stairs?  Do not leave any items on the stairs.  Make sure that there are handrails on both sides of the stairs and use them. Fix handrails that are broken or loose. Make sure that handrails are as long as the stairways.  Check any carpeting to make sure that it is firmly attached to the stairs. Fix any carpet that is loose or worn.  Avoid having throw rugs at the top or bottom of the stairs. If you do have throw rugs, attach them to the floor with carpet tape.  Make sure that you have a light switch at the top of the stairs and the bottom of the stairs. If you do not have them, ask someone to add them for you. What else can I do to help prevent falls?  Wear shoes that:  Do not have high heels.  Have rubber bottoms.  Are comfortable and fit you well.  Are closed at the toe. Do not wear  sandals.  If you use a stepladder:  Make sure that it is fully opened. Do not climb a closed stepladder.  Make sure that both sides of the stepladder are locked into place.  Ask someone to hold it for you, if possible.  Clearly mark and make sure that you can see:  Any grab bars or handrails.  First and last steps.  Where the edge of each step is.  Use tools that help you move around (mobility aids) if they are needed. These include:  Canes.  Walkers.  Scooters.  Crutches.  Turn on the lights when you go into a dark area. Replace any light bulbs as soon as they burn out.  Set up your furniture so you have a clear path. Avoid moving your furniture around.  If any of your floors are uneven, fix them.  If there are any pets around you, be aware of where they are.  Review your medicines with your doctor. Some medicines can make you feel dizzy. This can increase your chance of falling. Ask your doctor what other things that you can do to help prevent falls. This information is not intended to replace advice given to you by your health care provider. Make sure you discuss any questions you have with your health care provider. Document Released: 05/23/2009 Document Revised: 01/02/2016 Document Reviewed: 08/31/2014 Elsevier Interactive Patient Education  2017 [employer].

## 2020-12-06 NOTE — Progress Notes (Signed)
Subjective:   Robin Morgan is a 65 y.o. female who presents for Medicare Annual (Subsequent) preventive examination.  Review of Systems     Cardiac Risk Factors include: advanced age (>57men, >69 women);hypertension;dyslipidemia;diabetes mellitus     Objective:    Today's Vitals   12/06/20 1350 12/06/20 1355  BP:  110/64  Pulse:  98  Temp:  98 F (36.7 C)  SpO2:  95%  Weight:  132 lb 12.8 oz (60.2 kg)  PainSc: 8     Body mass index is 25.09 kg/m.  Advanced Directives 12/06/2020 02/07/2020 06/03/2017 04/29/2017 04/08/2017 04/08/2017 01/06/2017  Does Patient Have a Medical Advance Directive? Yes No No No No No No  Type of Advance Directive Living will - - - - - -  Would patient like information on creating a medical advance directive? - Yes (MAU/Ambulatory/Procedural Areas - Information given) No - Patient declined - - No - Patient declined -    Current Medications (verified) Outpatient Encounter Medications as of 12/06/2020  Medication Sig  . atorvastatin (LIPITOR) 40 MG tablet TAKE 1 TABLET BY MOUTH ONCE DAILY  . b complex vitamins tablet Take 1 tablet by mouth daily.  . Calcium Carbonate-Vitamin D (CALCIUM-VITAMIN D) 500-200 MG-UNIT per tablet Take 1 tablet by mouth 2 (two) times daily with a meal.  . co-enzyme Q-10 30 MG capsule Take 30 mg by mouth at bedtime.   . diclofenac sodium (VOLTAREN) 1 % GEL Apply 2 g topically 4 (four) times daily. (Patient taking differently: Apply 2 g topically as needed.)  . DULoxetine (CYMBALTA) 30 MG capsule TAKE 3 CAPSULES BY MOUTH DAILY  . esomeprazole (NEXIUM) 40 MG capsule TAKE 1 CAPSULE BY MOUTH 2 TIMES DAILY BEFORE A MEAL. MORNING AND EVENING  . FARXIGA 10 MG TABS tablet TAKE 1 TABLET BY MOUTH ONCE A DAY  . fenofibrate 160 MG tablet TAKE 1 TABLET BY MOUTH ONCE A DAY  . gabapentin (NEURONTIN) 300 MG capsule TAKE 2 CAPSULES BY MOUTH IN THE MORNING,2 CAPSULES AT LUNCH AND 2 CAPSULES AT BEDTIME  . Insulin Glargine (BASAGLAR KWIKPEN) 100  UNIT/ML INJECT 35 UNITS INTO THE SKIN 2 TIMES DAILY  . levothyroxine (SYNTHROID) 88 MCG tablet TAKE 1 TAB BY MOUTH ONCE DAILY. TAKE ON AN EMPTY STOMACH WITH A GLASS OF WATER ATLEAST 30-60 MINUTES BEFORE BREAKFAST  . lisinopril (ZESTRIL) 10 MG tablet Take 0.5 tablets (5 mg total) by mouth daily.  . magnesium gluconate (MAGONATE) 500 MG tablet Take 1 tablet (500 mg total) by mouth daily.  . metFORMIN (GLUCOPHAGE) 1000 MG tablet TAKE 1 TABLET BY MOUTH TWICE A DAY WITH MEALS  . morphine (MSIR) 15 MG tablet TAKE 1 TABLET BY MOUTH 3 TIMES DAILY AS NEEDED FOR MODERATE PAIN  . MOVANTIK 25 MG TABS tablet TAKE 1 TABLET BY MOUTH ONCE DAILY  . Omega-3 Fatty Acids (FISH OIL) 1000 MG CAPS Take 2 capsules by mouth daily.  . OPTIVE 0.5-0.9 % ophthalmic solution Place 1 drop into both eyes as needed.   . RESTASIS 0.05 % ophthalmic emulsion Place 1 drop into both eyes daily.   Marland Kitchen tiZANidine (ZANAFLEX) 2 MG tablet TAKE 1/2 TO 1 TABLET (1-2 MG TOTAL) BY MOUTH EVERY 6 HOURS AS NEEDED FOR MUSCLE SPASMS  . topiramate (TOPAMAX) 25 MG tablet Take 1-2 tablets (25-50 mg total) by mouth at bedtime.  Marland Kitchen zolpidem (AMBIEN) 10 MG tablet TAKE 1 TABLET BY MOUTH AT BEDTIME AS NEEDED FOR SLEEP  . [DISCONTINUED] azithromycin (ZITHROMAX) 250 MG tablet Take 2  tabs today, then 1 tab daily for 4 days (Patient not taking: Reported on 12/06/2020)  . [DISCONTINUED] esomeprazole (NEXIUM) 40 MG capsule Take by mouth.  . [DISCONTINUED] gentamicin ointment (GARAMYCIN) 0.1 % Apply 1 application topically as needed.   . [DISCONTINUED] LANTUS SOLOSTAR 100 UNIT/ML Solostar Pen Inject 35 Units into the skin 2 (two) times daily.  . [DISCONTINUED] predniSONE (DELTASONE) 10 MG tablet Take 4 tabs qd x 2 days, 3 qd x 2 days, 2 qd x 2d, 1qd x 3 days  . [DISCONTINUED] triamcinolone cream (KENALOG) 0.1 % Apply 1 application topically 2 (two) times daily. As needed   No facility-administered encounter medications on file as of 12/06/2020.    Allergies  (verified) Compazine, Prochlorperazine, and Naproxen   History: Past Medical History:  Diagnosis Date  . Calcific tendonitis   . Cervical spondylosis without myelopathy   . Depression   . Diabetes mellitus   . Esophageal stricture   . Fibromyalgia   . GERD (gastroesophageal reflux disease)   . Hiatal hernia   . Hyperlipidemia   . Hypertension    Past Surgical History:  Procedure Laterality Date  . ABDOMINAL HYSTERECTOMY     1986  . APPENDECTOMY     1983  . BREAST EXCISIONAL BIOPSY Right 1990's  . BREAST SURGERY     rt breast cyst done in the 90's  . CHOLECYSTECTOMY    . GALLBLADDER SURGERY     1983  . ROTATOR CUFF REPAIR     right  . SPINE SURGERY    . TONSILLECTOMY     1973   Family History  Problem Relation Age of Onset  . Alzheimer's disease Mother   . Diabetes Father   . Heart disease Maternal Grandmother   . Breast cancer Paternal Grandmother 65   Social History   Socioeconomic History  . Marital status: Divorced    Spouse name: Not on file  . Number of children: 1  . Years of education: Not on file  . Highest education level: Not on file  Occupational History  . Occupation: disabled  Tobacco Use  . Smoking status: Never Smoker  . Smokeless tobacco: Never Used  Vaping Use  . Vaping Use: Never used  Substance and Sexual Activity  . Alcohol use: No  . Drug use: No  . Sexual activity: Not Currently  Other Topics Concern  . Not on file  Social History Narrative  . Not on file   Social Determinants of Health   Financial Resource Strain: Low Risk   . Difficulty of Paying Living Expenses: Not hard at all  Food Insecurity: No Food Insecurity  . Worried About Charity fundraiser in the Last Year: Never true  . Ran Out of Food in the Last Year: Never true  Transportation Needs: No Transportation Needs  . Lack of Transportation (Medical): No  . Lack of Transportation (Non-Medical): No  Physical Activity: Inactive  . Days of Exercise per Week: 0  days  . Minutes of Exercise per Session: 0 min  Stress: No Stress Concern Present  . Feeling of Stress : Only a little  Social Connections: Socially Isolated  . Frequency of Communication with Friends and Family: Twice a week  . Frequency of Social Gatherings with Friends and Family: Once a week  . Attends Religious Services: Never  . Active Member of Clubs or Organizations: No  . Attends Archivist Meetings: Never  . Marital Status: Divorced    Tobacco Counseling Counseling given:  Not Answered   Clinical Intake:  Pre-visit preparation completed: Yes  Pain : 0-10 Pain Score: 8  Pain Type: Chronic pain Pain Location: Back (neck) Pain Descriptors / Indicators: Aching,Sharp Pain Onset: More than a month ago Pain Frequency: Constant     BMI - recorded: 25.09 Nutritional Status: BMI 25 -29 Overweight Nutritional Risks: None Diabetes: Yes CBG done?: No Did pt. bring in CBG monitor from home?: No  How often do you need to have someone help you when you read instructions, pamphlets, or other written materials from your doctor or pharmacy?: 1 - Never  Diabetic?Nutrition Risk Assessment:  Has the patient had any N/V/D within the last 2 months?  No  Does the patient have any non-healing wounds?  No  Has the patient had any unintentional weight loss or weight gain?  No   Diabetes:  Is the patient diabetic?  Yes  If diabetic, was a CBG obtained today?  No  Did the patient bring in their glucometer from home?  Yes  How often do you monitor your CBG's? At times .   Financial Strains and Diabetes Management:  Are you having any financial strains with the device, your supplies or your medication? No .  Does the patient want to be seen by Chronic Care Management for management of their diabetes?  No  Would the patient like to be referred to a Nutritionist or for Diabetic Management?  No   Diabetic Exams:  Diabetic Eye Exam: Completed 10/12/20 Diabetic Foot Exam:  Completed 05/27/20   Interpreter Needed?: No  Information entered by :: Charlott Rakes, LPN   Activities of Daily Living In your present state of health, do you have any difficulty performing the following activities: 12/06/2020  Hearing? N  Vision? N  Difficulty concentrating or making decisions? Y  Comment memory at times  Walking or climbing stairs? N  Dressing or bathing? N  Doing errands, shopping? N  Preparing Food and eating ? N  Using the Toilet? N  In the past six months, have you accidently leaked urine? N  Do you have problems with loss of bowel control? N  Managing your Medications? N  Managing your Finances? N  Housekeeping or managing your Housekeeping? N  Some recent data might be hidden    Patient Care Team: Leamon Arnt, MD as PCP - General (Family Medicine) Iran Planas, MD as Consulting Physician (Orthopedic Surgery) Meredith Staggers, MD as Consulting Physician (Physical Medicine and Rehabilitation) Jolene Provost, PA-C as Physician Assistant (Otolaryngology) Dohmeier, Asencion Partridge, MD as Consulting Physician (Neurology) Arta Silence, MD as Consulting Physician (Gastroenterology) Hortencia Pilar, MD as Consulting Physician (Ophthalmology)  Indicate any recent Medical Services you may have received from other than [facility] providers in the past year (date may be approximate).     Assessment:   This is a routine wellness examination for Taytem.  Hearing/Vision screen  Hearing Screening   125Hz  250Hz  500Hz  1000Hz  2000Hz  3000Hz  4000Hz  6000Hz  8000Hz   Right ear:           Left ear:           Comments: Pt denies any hearing issues   Vision Screening Comments: Pt follows up with Dr Kathlen Mody at [facility]   Dietary issues and exercise activities discussed: Current Exercise Habits: The patient does not participate in regular exercise at present  Goals    . Patient Stated     None at this time      Depression Screen PHQ  2/9 Scores  12/06/2020 09/20/2020 02/19/2020 12/06/2019 12/01/2019 09/27/2019 06/16/2018  PHQ - 2 Score 1 5 6 6 3 6  0  PHQ- 9 Score - 14 19 - 16 - 0    Fall Risk Fall Risk  12/06/2020 09/05/2020 06/05/2020 04/03/2020 02/07/2020  Falls in the past year? 1 0 0 0 -  Number falls in past yr: 1 - - - 1  Comment - - - - -  Injury with Fall? 0 - - - 1  Risk for fall due to : Impaired balance/gait;Impaired vision No Fall Risks - - -  Follow up Falls prevention discussed - - - -    FALL RISK PREVENTION PERTAINING TO THE HOME:  Any stairs in or around the home? No  If so, are there any without handrails? No  Home free of loose throw rugs in walkways, pet beds, electrical cords, etc? Yes  Adequate lighting in your home to reduce risk of falls? Yes   ASSISTIVE DEVICES UTILIZED TO PREVENT FALLS:  Life alert? No  Use of a cane, walker or w/c? No  Grab bars in the bathroom? Yes  Shower chair or bench in shower? No  Elevated toilet seat or a handicapped toilet? No   TIMED UP AND GO:  Was the test performed? Yes .  Length of time to ambulate 10 feet: 10 sec.   Gait steady and fast without use of assistive device  Cognitive Function: MMSE - Mini Mental State Exam 05/26/2018  Orientation to time 5  Orientation to Place 5  Registration 3  Attention/ Calculation 5  Recall 2  Language- name 2 objects 2  Language- repeat 1  Language- follow 3 step command 3  Language- read & follow direction 1  Write a sentence 1  Copy design 1  Total score 29     6CIT Screen 12/06/2020 12/01/2019  What Year? 0 points 0 points  What month? 0 points 0 points  What time? - 0 points  Count back from 20 0 points 0 points  Months in reverse 4 points 0 points  Repeat phrase 6 points 0 points  Total Score - 0    Immunizations Immunization History  Administered Date(s) Administered  . Fluad Quad(high Dose 65+) 04/06/2019  . Influenza Split 06/04/2009, 05/02/2012  . Influenza, High Dose Seasonal PF 05/13/2020  .  Influenza, Quadrivalent, Recombinant, Inj, Pf 05/12/2016, 07/07/2017  . Influenza, Seasonal, Injecte, Preservative Fre 06/06/2014, 04/24/2015  . Influenza,inj,Quad PF,6+ Mos 05/12/2016, 07/07/2017, 05/05/2018  . Influenza-Unspecified 09/03/2011, 04/19/2013  . PFIZER(Purple Top)SARS-COV-2 Vaccination 10/03/2019, 10/24/2019, 06/28/2020  . Pneumococcal Conjugate-13 12/01/2019  . Pneumococcal Polysaccharide-23 08/29/2009, 10/19/2018  . Tdap 05/11/2012    TDAP status: Up to date  Flu Vaccine status: Up to date  Pneumococcal vaccine status: Up to date  Covid-19 vaccine status: Completed vaccines  Qualifies for Shingles Vaccine? Yes   Zostavax completed No   Shingrix Completed?: No.    Education has been provided regarding the importance of this vaccine. Patient has been advised to call insurance company to determine out of pocket expense if they have not yet received this vaccine. Advised may also receive vaccine at local pharmacy or Health Dept. Verbalized acceptance and understanding.  Screening Tests Health Maintenance  Topic Date Due  . DEXA SCAN  11/04/2019  . INFLUENZA VACCINE  03/10/2021  . HEMOGLOBIN A1C  03/20/2021  . FOOT EXAM  05/27/2021  . MAMMOGRAM  06/19/2021  . OPHTHALMOLOGY EXAM  10/22/2021  . TETANUS/TDAP  05/11/2022  . COLONOSCOPY (  Pts 45-77yrs Insurance coverage will need to be confirmed)  11/19/2025  . COVID-19 Vaccine  Completed  . Hepatitis C Screening  Completed  . PNA vac Low Risk Adult  Completed  . HPV VACCINES  Aged Out    Health Maintenance  Health Maintenance Due  Topic Date Due  . DEXA SCAN  11/04/2019    Colorectal cancer screening: Type of screening: Colonoscopy. Completed 11/20/15. Repeat every 10 years  Mammogram status: Completed 06/19/20. Repeat every year  Bone Density status: Completed 11/03/17. Results reflect: Bone density results: OSTEOPENIA. Repeat every 2 years.    Additional Screening:  Hepatitis C Screening: Completed  07/07/17  Vision Screening: Recommended annual ophthalmology exams for early detection of glaucoma and other disorders of the eye. Is the patient up to date with their annual eye exam?  Yes  Who is the provider or what is the name of the office in which the patient attends annual eye exams? Dr weaver  If pt is not established with a provider, would they like to be referred to a provider to establish care? No .   Dental Screening: Recommended annual dental exams for proper oral hygiene  Community Resource Referral / Chronic Care Management: CRR required this visit?  No   CCM required this visit?  No      Plan:     I have personally reviewed and noted the following in the patient's chart:   . Medical and social history . Use of alcohol, tobacco or illicit drugs  . Current medications and supplements . Functional ability and status . Nutritional status . Physical activity . Advanced directives . List of other physicians . Hospitalizations, surgeries, and ER visits in previous 12 months . Vitals . Screenings to include cognitive, depression, and falls . Referrals and appointments  In addition, I have reviewed and discussed with patient certain preventive protocols, quality metrics, and best practice recommendations. A written personalized care plan for preventive services as well as general preventive health recommendations were provided to patient.     Willette Brace, LPN   [id number]   Nurse Notes: None

## 2020-12-11 ENCOUNTER — Encounter: Payer: Medicare HMO | Attending: Physical Medicine and Rehabilitation | Admitting: Physical Medicine & Rehabilitation

## 2020-12-11 ENCOUNTER — Other Ambulatory Visit: Payer: Self-pay

## 2020-12-11 ENCOUNTER — Encounter: Payer: Self-pay | Admitting: Physical Medicine & Rehabilitation

## 2020-12-11 DIAGNOSIS — M7918 Myalgia, other site: Secondary | ICD-10-CM | POA: Diagnosis not present

## 2020-12-11 DIAGNOSIS — M797 Fibromyalgia: Secondary | ICD-10-CM | POA: Insufficient documentation

## 2020-12-11 DIAGNOSIS — M75102 Unspecified rotator cuff tear or rupture of left shoulder, not specified as traumatic: Secondary | ICD-10-CM | POA: Diagnosis not present

## 2020-12-11 MED ORDER — MORPHINE SULFATE 15 MG PO TABS: ORAL_TABLET | ORAL | 0 refills | Status: DC

## 2020-12-11 NOTE — Progress Notes (Signed)
Subjective:    Patient ID: Robin Morgan, female    DOB: June 21, 1956, 65 y.o.   MRN: [id number]  HPI   Saretta is here in follow up of her chronic pain. She had some benefit with the topamax for her headaches although she's had more recently as her neck /shoulder tighntess has increased. At the same time her left shoulder has become more painful. She does some generalized stretches but says she has some difficulties applying anything topical. She has a heating pad at home that she uses. The muscle relaxant helps.   She remains on ms IR for pain control.   She hasn't had any further f/u with NS/anaesthesia.   Mood has been ok although she continues to deal with a lot of stresses concerning her family.   Pain Inventory Average Pain 8 Pain Right Now 8 My pain is tingling and aching  In the last 24 hours, has pain interfered with the following? General activity 9 Relation with others 8 Enjoyment of life 8 What TIME of day is your pain at its worst? morning , daytime, evening and night Sleep (in general) Poor  Pain is worse with: bending, sitting and standing Pain improves with: medication and injections Relief from Meds: 8  Family History  Problem Relation Age of Onset  . Alzheimer's disease Mother   . Diabetes Father   . Heart disease Maternal Grandmother   . Breast cancer Paternal Grandmother 31   Social History   Socioeconomic History  . Marital status: Divorced    Spouse name: Not on file  . Number of children: 1  . Years of education: Not on file  . Highest education level: Not on file  Occupational History  . Occupation: disabled  Tobacco Use  . Smoking status: Never Smoker  . Smokeless tobacco: Never Used  Vaping Use  . Vaping Use: Never used  Substance and Sexual Activity  . Alcohol use: No  . Drug use: No  . Sexual activity: Not Currently  Other Topics Concern  . Not on file  Social History Narrative  . Not on file   Social Determinants of Health    Financial Resource Strain: Low Risk   . Difficulty of Paying Living Expenses: Not hard at all  Food Insecurity: No Food Insecurity  . Worried About Charity fundraiser in the Last Year: Never true  . Ran Out of Food in the Last Year: Never true  Transportation Needs: No Transportation Needs  . Lack of Transportation (Medical): No  . Lack of Transportation (Non-Medical): No  Physical Activity: Inactive  . Days of Exercise per Week: 0 days  . Minutes of Exercise per Session: 0 min  Stress: No Stress Concern Present  . Feeling of Stress : Only a little  Social Connections: Socially Isolated  . Frequency of Communication with Friends and Family: Twice a week  . Frequency of Social Gatherings with Friends and Family: Once a week  . Attends Religious Services: Never  . Active Member of Clubs or Organizations: No  . Attends Archivist Meetings: Never  . Marital Status: Divorced   Past Surgical History:  Procedure Laterality Date  . ABDOMINAL HYSTERECTOMY     1986  . APPENDECTOMY     1983  . BREAST EXCISIONAL BIOPSY Right 1990's  . BREAST SURGERY     rt breast cyst done in the 90's  . CHOLECYSTECTOMY    . GALLBLADDER SURGERY     1983  . ROTATOR  CUFF REPAIR     right  . SPINE SURGERY    . TONSILLECTOMY     1973   Past Surgical History:  Procedure Laterality Date  . ABDOMINAL HYSTERECTOMY     1986  . APPENDECTOMY     1983  . BREAST EXCISIONAL BIOPSY Right 1990's  . BREAST SURGERY     rt breast cyst done in the 90's  . CHOLECYSTECTOMY    . GALLBLADDER SURGERY     1983  . ROTATOR CUFF REPAIR     right  . SPINE SURGERY    . TONSILLECTOMY     1973   Past Medical History:  Diagnosis Date  . Calcific tendonitis   . Cervical spondylosis without myelopathy   . Depression   . Diabetes mellitus   . Esophageal stricture   . Fibromyalgia   . GERD (gastroesophageal reflux disease)   . Hiatal hernia   . Hyperlipidemia   . Hypertension    BP 103/65   Pulse  90   Temp 98.5 F (36.9 C)   Ht 5\' 1"  (1.549 m)   Wt 133 lb 6.4 oz (60.5 kg)   SpO2 97%   BMI 25.21 kg/m   Opioid Risk Score:   Fall Risk Score:  `1  Depression screen PHQ 2/9  Depression screen [facility] 2/9 12/06/2020 09/20/2020 02/19/2020 12/06/2019 12/01/2019 09/27/2019 06/16/2018  Decreased Interest 0 3 3 3 2 3  0  Down, Depressed, Hopeless 1 2 3 3 1 3  0  PHQ - 2 Score 1 5 6 6 3 6  0  Altered sleeping - 3 3 - 2 - 0  Tired, decreased energy - 3 3 - 3 - 0  Change in appetite - 3 3 - 2 - 0  Feeling bad or failure about yourself  - 0 1 - 3 - 0  Trouble concentrating - 0 3 - 2 - 0  Moving slowly or fidgety/restless - 0 0 - 1 - 0  Suicidal thoughts - 0 0 - 0 - 0  PHQ-9 Score - 14 19 - 16 - 0  Difficult doing work/chores - Somewhat difficult Very difficult - Very difficult - Not difficult at all  Some recent data might be hidden   Review of Systems  Musculoskeletal: Positive for back pain and neck pain.  All other systems reviewed and are negative.      Objective:   Physical Exam General: No acute distress HEENT: EOMI, oral membranes moist Cards: reg rate  Chest: normal effort Abdomen: Soft, NT, ND Skin: dry, intact Extremities: no edema Psych: pleasant and appropriate.  Seems in better spirits today. Neuro:  Patient is cognitively appropriate.  Neuro cranial nerve exam.  Normal insight and awareness.  Strength is grossly 4 out of 5 in both upper extremities proximal distal.  Reflexes are 1-2+.  Sensory exam grossly intact in all 4's. . Musculoskeletal:  traps, ls, rhomboids tight. Left shoulder with + impingement sign. Right shoulder sl tender with ROM. Neck ROM sl limited in rotation and flexion/extension             Assessment & Plan:     1. Fibromyalgia:  Continue home exercise program as possible  -heat, topicals for shoulder girdle pain  -stretching/posture were discussed today  2. Rotator cuff syndrome bilaterally.            -HEP           -After informed consent and  preparation of the skin with betadine and isopropyl alcohol,  I injected 6mg  (1cc) of celestone and 4cc of 1% lidocaine into the left subacromial space via lateral approach. Additionally, aspiration was performed prior to injection. The patient tolerated well, and no complications were encountered. Afterward the area was cleaned and dressed. Post- injection instructions were provided.   3. Cervicalgia. Post-laminectomy syndrome, facet arthropathy:  Cervical Radiculopathy:     Continue Gabapentin.                     -MS IR 15mg  q12 prn #60. RF today  -Medication was refilled and a second prescription was sent to the patient's pharmacy for next month.               -.We will continue the controlled substance monitoring program, this consists of regular clinic visits, examinations, routine drug screening, pill counts as well as use of [place] Controlled Substance Reporting System. [place] today.               -? cervical MBB's of cervical spine        -C7-T1 ESI without beneift. Repeat injections with some benefit             -may need surgical fusion at some point          -NS follow up? 4. Depression: Continue Cymbalta.   60mg  bid        -continue neuropsych for pain mgt techniques, coping skills==very helpfull 6. Lumbar Radiculopathy:  HEP 7 OA of right hand:    Voltaren gel and heat therapy.              . 8. Migraines: stable, largely driven by shoulder girdle and neck pain.             -continue topamax  50mg  qhs.    9. Left CTS:S/P EMG on 03/08/2017: S/P Carpal Tunnel Release on 06/14/2017 via . Dr. Apolonio Schneiders.  10. OIC: movantik           -continue diet/probiotic, bowels are fairly regular hip present   15 minutes of face to face patient care time were spent during this visit. All questions were encouraged and answered.  Follow up with me in 2 mos .

## 2020-12-11 NOTE — Patient Instructions (Signed)
PLEASE FEEL FREE TO CALL OUR OFFICE WITH ANY PROBLEMS OR QUESTIONS ([phone])      

## 2021-01-09 DIAGNOSIS — M5412 Radiculopathy, cervical region: Secondary | ICD-10-CM | POA: Diagnosis not present

## 2021-01-09 DIAGNOSIS — M542 Cervicalgia: Secondary | ICD-10-CM | POA: Diagnosis not present

## 2021-01-15 ENCOUNTER — Other Ambulatory Visit: Payer: Self-pay

## 2021-01-15 ENCOUNTER — Ambulatory Visit
Admission: RE | Admit: 2021-01-15 | Discharge: 2021-01-15 | Disposition: A | Discharge status: Home / Self Care | Payer: Medicare HMO | Source: Ambulatory Visit | Attending: Family Medicine | Admitting: Family Medicine

## 2021-01-15 DIAGNOSIS — Z780 Asymptomatic menopausal state: Secondary | ICD-10-CM | POA: Diagnosis not present

## 2021-01-15 DIAGNOSIS — M85852 Other specified disorders of bone density and structure, left thigh: Secondary | ICD-10-CM | POA: Diagnosis not present

## 2021-01-15 DIAGNOSIS — M810 Age-related osteoporosis without current pathological fracture: Secondary | ICD-10-CM | POA: Diagnosis not present

## 2021-01-15 DIAGNOSIS — Z13820 Encounter for screening for osteoporosis: Secondary | ICD-10-CM

## 2021-01-20 ENCOUNTER — Encounter: Payer: Medicare HMO | Attending: Physical Medicine and Rehabilitation | Admitting: Psychology

## 2021-01-20 ENCOUNTER — Other Ambulatory Visit: Payer: Self-pay

## 2021-01-20 ENCOUNTER — Encounter: Payer: Self-pay | Admitting: Psychology

## 2021-01-20 DIAGNOSIS — F339 Major depressive disorder, recurrent, unspecified: Secondary | ICD-10-CM

## 2021-01-20 DIAGNOSIS — M961 Postlaminectomy syndrome, not elsewhere classified: Secondary | ICD-10-CM | POA: Diagnosis not present

## 2021-01-20 DIAGNOSIS — M5136 Other intervertebral disc degeneration, lumbar region: Secondary | ICD-10-CM

## 2021-01-20 DIAGNOSIS — R69 Illness, unspecified: Secondary | ICD-10-CM | POA: Diagnosis not present

## 2021-01-20 DIAGNOSIS — M5010 Cervical disc disorder with radiculopathy, unspecified cervical region: Secondary | ICD-10-CM

## 2021-01-20 DIAGNOSIS — M797 Fibromyalgia: Secondary | ICD-10-CM | POA: Diagnosis not present

## 2021-01-20 DIAGNOSIS — G894 Chronic pain syndrome: Secondary | ICD-10-CM

## 2021-01-20 NOTE — Progress Notes (Signed)
Neuropsychology Visit  Patient:  Robin Morgan   DOB: [birth year]  MR Number: [id number]  Location: [facility] FOR PAIN AND [facility] MEDICINE [facility] PHYSICAL MEDICINE AND REHABILITATION [place], [place] [id number] [place] [address] Dept: [phone]  Date of Service: 01/20/2021  Start: 1 PM End: 2 PM  Duration of Service: 1 Hour  T today's visit was conducted in my outpatient clinic office with the patient myself present.  Today's visit was an in person visit.  Provider/Observer:     Edgardo Roys PsyD  Chief Complaint:      Chief Complaint  Patient presents with   Pain   Back Pain   Anxiety   Depression    Reason For Service:     Robin Morgan. Lawrance is a 65 year old right-handed female referred by Dr. Naaman Plummer for neuropsychological/psychological consultation due to significant depression and anxiety with primary stressors related to family issues in the setting of fibromyalgia and chronic severe pain symptoms.  The patient reports that she has had significant pain in her back primarily mid back for 20 years or more.  The patient is also been diagnosed with fibromyalgia.  The patient had surgery on her neck to try to alleviate help with her severe recurrent headaches but experience little change post surgery.  The patient also has significant shoulder pain and severe headache with these pain and headaches resulting in her having to spend multiple days at a time essentially in bed.  The patient is continued to struggle with anxiety and depressive symptoms on top of her chronic pain disorder.  The patient was asked to help take care of her father who has dementia and her sisters expected her to stay there for an extended period of time.  The patient was there for almost a week experiencing increasing problems due to poor sleeping conditions and an exacerbation of her underlying chronic pain symptoms.  There continues to be stressors and  animosity between the patient and her siblings.  Treatment Interventions:  Therapeutic interventions for issues related to chronic pain/fibromyalgia and recurrent headaches and depression anxiety symptoms with significant psychosocial stressors.  Participation Level:   Active  Participation Quality:  Appropriate and Attentive      Behavioral Observation:  Well Groomed, Alert, and Appropriate.   Current Psychosocial Factors: The patient reports that the primary stressors that she has been dealing with related to one of her sisters has diminished considerably since she had a confrontation with her around the Christmas holiday.  The sister has stopped interjecting into the patient's activities attempts at controlling the patient herself.  The patient reports that she has continued to have pain and they are now looking at her cervical spine for issues that could be causing her pain down her arms.  The patient reports that sleep has been disturbed with this chronic pain situation.  Content of Session:   Reviewed current symptoms and continue to develop coping skills around her chronic pain and fibromyalgia symptoms as well as headaches and dealing with cervical postlaminectomy syndrome.  We also worked on significant psychosocial stressors that are being impacted by the patient's limited physical functioning as well as her stressors having a significant deleterious on her overall pain symptoms.  Effectiveness of Interventions: The patient has been quite open and active in these therapeutic processes and rapport been easily established.  The patient's cognitive function is good and while stress affects her ability to stay focused this is not indicative of any type  of cognitive deficits with the patient.  The patient is already working on some of the initial issues we have focused around sleep hygiene, physical activity and other coping issues around her chronic pain symptoms.  Target Goals:   Goals include  building better coping and adaptive skills around her chronic pain symptoms and working on better pain management as well as working on significant psychosocial stressors that are negatively impacting her current medical issues.  Goals Last Reviewed:   01/20/2021  Goals Addressed Today:    Today we worked a lot on the various stressors in her life that are exacerbating her chronic pain symptoms.  Impression/Diagnosis:   Robin Morgan. Rennert is a 65 year old right-handed female referred by Dr. Naaman Plummer for neuropsychological/psychological consultation due to significant depression and anxiety with primary stressors related to family issues in the setting of fibromyalgia and chronic severe pain symptoms.  The patient reports that she has had significant pain in her back primarily mid back for 20 years or more.  The patient is also been diagnosed with fibromyalgia.  The patient had surgery on her neck to try to alleviate help with her severe recurrent headaches but experience little change post surgery.  The patient also has significant shoulder pain and severe headache with these pain and headaches resulting in her having to spend multiple days at a time essentially in bed.   I do think that the patient's anxiety, stress and depressive symptoms do play and exacerbating role in her overall pain symptoms and while she clearly has abnormalities in lumbar and cervical regions as a primary factor for her pain symptoms her stress responses, fibromyalgia and depression do play a role in the acute day-to-day level of her pain.  Today we have continue to work on therapeutic interventions around chronic pain and the patient taking better care of her self particular around issues of depression and stress.  Diagnosis:   Chronic pain syndrome  Fibromyalgia  DDD (degenerative disc disease), lumbar  Depression, recurrent ([facility])    Ilean Skill, Psy.D. Clinical Psychologist Neuropsychologist

## 2021-01-21 DIAGNOSIS — M542 Cervicalgia: Secondary | ICD-10-CM | POA: Diagnosis not present

## 2021-01-21 DIAGNOSIS — M5412 Radiculopathy, cervical region: Secondary | ICD-10-CM | POA: Diagnosis not present

## 2021-01-23 DIAGNOSIS — M5412 Radiculopathy, cervical region: Secondary | ICD-10-CM | POA: Diagnosis not present

## 2021-01-23 DIAGNOSIS — Z6825 Body mass index (BMI) 25.0-25.9, adult: Secondary | ICD-10-CM | POA: Diagnosis not present

## 2021-02-01 ENCOUNTER — Other Ambulatory Visit: Payer: Self-pay | Admitting: Family Medicine

## 2021-02-03 NOTE — Telephone Encounter (Signed)
Last Refill 06/26/2020 Last OV 09/20/2020 dx cough

## 2021-02-07 ENCOUNTER — Encounter: Payer: Self-pay | Admitting: Family Medicine

## 2021-02-07 NOTE — Progress Notes (Signed)
Please call patient: I have reviewed his/her lab results. Bone density shows worsening; now osteoporosis. Pt is due for diabetes recheck visit and we can discuss treatment options . Please schedule next available (august is ok).

## 2021-02-12 ENCOUNTER — Encounter: Payer: Medicare HMO | Attending: Physical Medicine and Rehabilitation | Admitting: Physical Medicine & Rehabilitation

## 2021-02-12 ENCOUNTER — Other Ambulatory Visit: Payer: Self-pay

## 2021-02-12 ENCOUNTER — Encounter: Payer: Self-pay | Admitting: Physical Medicine & Rehabilitation

## 2021-02-12 VITALS — BP 93/58 | HR 90 | Temp 98.2°F | Ht 61.0 in | Wt 132.0 lb

## 2021-02-12 DIAGNOSIS — M7918 Myalgia, other site: Secondary | ICD-10-CM | POA: Diagnosis not present

## 2021-02-12 DIAGNOSIS — F339 Major depressive disorder, recurrent, unspecified: Secondary | ICD-10-CM | POA: Diagnosis not present

## 2021-02-12 DIAGNOSIS — M797 Fibromyalgia: Secondary | ICD-10-CM | POA: Diagnosis not present

## 2021-02-12 DIAGNOSIS — R69 Illness, unspecified: Secondary | ICD-10-CM | POA: Diagnosis not present

## 2021-02-12 DIAGNOSIS — Z5181 Encounter for therapeutic drug level monitoring: Secondary | ICD-10-CM

## 2021-02-12 DIAGNOSIS — Z79891 Long term (current) use of opiate analgesic: Secondary | ICD-10-CM | POA: Diagnosis not present

## 2021-02-12 DIAGNOSIS — M5010 Cervical disc disorder with radiculopathy, unspecified cervical region: Secondary | ICD-10-CM | POA: Insufficient documentation

## 2021-02-12 DIAGNOSIS — M5136 Other intervertebral disc degeneration, lumbar region: Secondary | ICD-10-CM | POA: Insufficient documentation

## 2021-02-12 DIAGNOSIS — G894 Chronic pain syndrome: Secondary | ICD-10-CM | POA: Diagnosis not present

## 2021-02-12 MED ORDER — MORPHINE SULFATE 15 MG PO TABS: ORAL_TABLET | ORAL | 0 refills | Status: DC

## 2021-02-12 NOTE — Patient Instructions (Signed)
DEEP TISSUE MASSAGE, DRY NEEDLING, ?ULTRA SOUND TO YOUR LEFT PERI-SCAPULAR MUSCLES INCLUDING RHOMBOIDS.   LEARN HOW TO MANAGE A HOME EXERCISE PROGRAM FOR THIS AREA AS WELL.

## 2021-02-12 NOTE — Progress Notes (Signed)
Subjective:    Patient ID: Robin Morgan, female    DOB: 09/05/1955, 65 y.o.   MRN: [id number]  HPI  Robin Morgan here in follow-up of her chronic pain.  She has seen neurosurgery who is starting out with bilateral epidural injections at C5-C6.  They are also talking to her about therapy.  We have injected her left shoulder in the past and talked about range of motion and muscular activities around the scapula.  [name] has continued pain in both arms with some associated weakness as well especially at the elbow and wrist.  She remains on morphine for pain control 15 mg every 8 hours as needed he also uses gabapentin Voltaren gel Cymbalta and tizanidine as well as topiramate for headaches.  Pain Inventory Average Pain 8 Pain Right Now 7 My pain is constant  In the last 24 hours, has pain interfered with the following? General activity 8 Relation with others 9 Enjoyment of life 9 What TIME of day is your pain at its worst? morning , daytime, evening, and night Sleep (in general) Fair  Pain is worse with: sitting and standing Pain improves with: rest and medication Relief from Meds: 8  Family History  Problem Relation Age of Onset   Alzheimer's disease Mother    Diabetes Father    Heart disease Maternal Grandmother    Breast cancer Paternal Grandmother 72   Social History   Socioeconomic History   Marital status: Divorced    Spouse name: Not on file   Number of children: 1   Years of education: Not on file   Highest education level: Not on file  Occupational History   Occupation: disabled  Tobacco Use   Smoking status: Never   Smokeless tobacco: Never  Vaping Use   Vaping Use: Never used  Substance and Sexual Activity   Alcohol use: No   Drug use: No   Sexual activity: Not Currently  Other Topics Concern   Not on file  Social History Narrative   Not on file   Social Determinants of Health   Financial Resource Strain: Low Risk    Difficulty of Paying Living Expenses:  Not hard at all  Food Insecurity: No Food Insecurity   Worried About Charity fundraiser in the Last Year: Never true   [place] in the Last Year: Never true  Transportation Needs: No Transportation Needs   Lack of Transportation (Medical): No   Lack of Transportation (Non-Medical): No  Physical Activity: Inactive   Days of Exercise per Week: 0 days   Minutes of Exercise per Session: 0 min  Stress: No Stress Concern Present   Feeling of Stress : Only a little  Social Connections: Socially Isolated   Frequency of Communication with Friends and Family: Twice a week   Frequency of Social Gatherings with Friends and Family: Once a week   Attends Religious Services: Never   Marine scientist or Organizations: No   Attends Music therapist: Never   Marital Status: Divorced   Past Surgical History:  Procedure Laterality Date   [place] EXCISIONAL BIOPSY Right 1990's   BREAST SURGERY     rt breast cyst done in the [place]     right   SPINE SURGERY  TONSILLECTOMY     1973   Past Surgical History:  Procedure Laterality Date   ABDOMINAL HYSTERECTOMY     1986   APPENDECTOMY     1983   BREAST EXCISIONAL BIOPSY Right 1990's   BREAST SURGERY     rt breast cyst done in the 90's   [place]     right   SPINE SURGERY     TONSILLECTOMY     1973   Past Medical History:  Diagnosis Date   Calcific tendonitis    Cervical spondylosis without myelopathy    Depression    Diabetes mellitus    Esophageal stricture    Fibromyalgia    GERD (gastroesophageal reflux disease)    Hiatal hernia    Hyperlipidemia    Hypertension    BP (!) 93/58   Pulse 90   Temp 98.2 F (36.8 C)   Ht 5\' 1"  (1.549 m)   Wt 132 lb (59.9 kg)   SpO2 94%   BMI 24.94 kg/m   Opioid Risk  Score:   Fall Risk Score:  `1  Depression screen PHQ 2/9  Depression screen [facility] 2/9 12/06/2020 09/20/2020 02/19/2020 12/06/2019 12/01/2019 09/27/2019 06/16/2018  Decreased Interest 0 3 3 3 2 3  0  Down, Depressed, Hopeless 1 2 3 3 1 3  0  PHQ - 2 Score 1 5 6 6 3 6  0  Altered sleeping - 3 3 - 2 - 0  Tired, decreased energy - 3 3 - 3 - 0  Change in appetite - 3 3 - 2 - 0  Feeling bad or failure about yourself  - 0 1 - 3 - 0  Trouble concentrating - 0 3 - 2 - 0  Moving slowly or fidgety/restless - 0 0 - 1 - 0  Suicidal thoughts - 0 0 - 0 - 0  PHQ-9 Score - 14 19 - 16 - 0  Difficult doing work/chores - Somewhat difficult Very difficult - Very difficult - Not difficult at all  Some recent data might be hidden    Review of Systems  Constitutional: Negative.   HENT: Negative.    Eyes: Negative.   Respiratory: Negative.    Cardiovascular: Negative.   Gastrointestinal: Negative.   Endocrine: Negative.   Genitourinary: Negative.   Musculoskeletal:  Positive for arthralgias, back pain and neck pain.  Skin: Negative.   Allergic/Immunologic: Negative.   Neurological: Negative.   Psychiatric/Behavioral: Negative.    All other systems reviewed and are negative.     Objective:   Physical Exam General: No acute distress HEENT: EOMI, oral membranes moist Cards: reg rate  Chest: normal effort Abdomen: Soft, NT, ND Skin: dry, intact Extremities: no edema Psych: pleasant and appropriate  Psych: pleasant and appropriate.  Seems in better spirits today. Neuro:  Patient is cognitively appropriate.  Neuro cranial nerve exam.  Normal insight and awareness.  Strength is grossly 4 out of 5 in both upper extremities proximal distal.  Reflexes are 1-2+.  Sensory exam grossly intact in all 4's. . Musculoskeletal:  traps, ls, rhomboids tight. again   Right shoulder sl tender with ROM. Neck ROM sl limited in rotation and flexion/extension             Assessment & Plan:     1. Fibromyalgia/myofascial  pain involving the left shoulder girdle, particularly the rhomboid area.  Continue home exercise program as possible             -  heat, topicals for shoulder girdle pain             -stretching/posture were discussed again today  -would benefit from ROM, modalities (including deep tissue massage, dry needling) to left periscapular muscles including rhomboids.  We have discussed therapy in the past.  I will defer therapy referral to neurosurgery right now  2. Rotator cuff syndrome bilaterally.            -HEP            -s/p injection with some benefit 3. Cervicalgia. Post-laminectomy syndrome, facet arthropathy:  Cervical Radiculopathy:     Continue Gabapentin.           -MS IR 15mg  q12 prn #60. RF today             -We will continue the controlled substance monitoring program, this consists of regular clinic visits, examinations, routine drug screening, pill counts as well as use of [place] Controlled Substance Reporting System. [place] was [place] today.   Medication was refilled and a second prescription was sent to the patient's pharmacy for next month.              -? cervical MBB's of cervical spine        -C7-T1 ESI without beneift. Repeat injections with some benefit           C5-6 foraminal stenosis on MRI--for ESI's at [facility] to start  -therapy per #1 4. Depression: Continue Cymbalta.   60mg  bid        -continue neuropsych for pain mgt techniques, coping skills==very helpfull 6. Lumbar Radiculopathy:  HEP 7 OA of right hand:    Voltaren gel and heat therapy.              . 8. Migraines: stable, largely driven by shoulder girdle and neck pain.             -continue topamax  50mg  qhs.    9. Left CTS:S/P EMG on 03/08/2017: S/P Carpal Tunnel Release on 06/14/2017 via . Dr. Apolonio Schneiders.  10. OIC: movantik           -continue diet/probiotic, bowels are fairly regular hip present   15 minutes of face to face patient care time were spent during this visit. All questions were encouraged  and answered.  Follow up with me in 2 mos.

## 2021-02-17 ENCOUNTER — Encounter (HOSPITAL_BASED_OUTPATIENT_CLINIC_OR_DEPARTMENT_OTHER): Payer: Medicare HMO | Admitting: Psychology

## 2021-02-17 ENCOUNTER — Telehealth: Payer: Self-pay

## 2021-02-17 ENCOUNTER — Other Ambulatory Visit: Payer: Self-pay

## 2021-02-17 DIAGNOSIS — Z79891 Long term (current) use of opiate analgesic: Secondary | ICD-10-CM | POA: Diagnosis not present

## 2021-02-17 DIAGNOSIS — G894 Chronic pain syndrome: Secondary | ICD-10-CM

## 2021-02-17 DIAGNOSIS — M5010 Cervical disc disorder with radiculopathy, unspecified cervical region: Secondary | ICD-10-CM

## 2021-02-17 DIAGNOSIS — M797 Fibromyalgia: Secondary | ICD-10-CM | POA: Diagnosis not present

## 2021-02-17 DIAGNOSIS — R69 Illness, unspecified: Secondary | ICD-10-CM | POA: Diagnosis not present

## 2021-02-17 DIAGNOSIS — F339 Major depressive disorder, recurrent, unspecified: Secondary | ICD-10-CM | POA: Diagnosis not present

## 2021-02-17 DIAGNOSIS — M5136 Other intervertebral disc degeneration, lumbar region: Secondary | ICD-10-CM | POA: Diagnosis not present

## 2021-02-17 DIAGNOSIS — Z5181 Encounter for therapeutic drug level monitoring: Secondary | ICD-10-CM | POA: Diagnosis not present

## 2021-02-17 DIAGNOSIS — M7918 Myalgia, other site: Secondary | ICD-10-CM | POA: Diagnosis not present

## 2021-02-17 NOTE — Telephone Encounter (Signed)
LMOVM to return call for lab results

## 2021-02-19 LAB — TOXASSURE SELECT,+ANTIDEPR,UR

## 2021-02-20 ENCOUNTER — Encounter: Payer: Self-pay | Admitting: Psychology

## 2021-02-20 DIAGNOSIS — M79622 Pain in left upper arm: Secondary | ICD-10-CM | POA: Diagnosis not present

## 2021-02-20 DIAGNOSIS — M542 Cervicalgia: Secondary | ICD-10-CM | POA: Diagnosis not present

## 2021-02-20 DIAGNOSIS — M5412 Radiculopathy, cervical region: Secondary | ICD-10-CM | POA: Diagnosis not present

## 2021-02-20 DIAGNOSIS — M79621 Pain in right upper arm: Secondary | ICD-10-CM | POA: Diagnosis not present

## 2021-02-20 NOTE — Progress Notes (Signed)
Neuropsychology Visit  Patient:  Robin Morgan   DOB: Feb 22, 1956  MR Number: [id number]  Location: [facility]ICINE AND REHABILITATION [place], [place] [id number] [place] [address] Dept: [phone]  Date of Service: 02/18/2021  Start: 2 PM End: 3 PM  Duration of Service: 1 Hour  Today's visit was conducted in my outpatient clinic office with the patient myself present.  Today's visit was an in person visit.  Provider/Observer:     Edgardo Roys PsyD  Chief Complaint:      Chief Complaint  Patient presents with   Pain   Back Pain   Anxiety   Depression    Reason For Service:     Robin Morgan. Beem is a 65 year old right-handed female referred by Dr. Naaman Plummer for neuropsychological/psychological consultation due to significant depression and anxiety with primary stressors related to family issues in the setting of fibromyalgia and chronic severe pain symptoms.  The patient reports that she has had significant pain in her back primarily mid back for 20 years or more.  The patient is also been diagnosed with fibromyalgia.  The patient had surgery on her neck to try to alleviate help with her severe recurrent headaches but experience little change post surgery.  The patient also has significant shoulder pain and severe headache with these pain and headaches resulting in her having to spend multiple days at a time essentially in bed.  The patient is continued to struggle with anxiety and depressive symptoms on top of her chronic pain disorder.  The patient was asked to help take care of her father who has dementia and her sisters expected her to stay there for an extended period of time.  The patient was there for almost a week experiencing increasing problems due to poor sleeping conditions and an exacerbation of her underlying chronic pain symptoms.  There continues to be stressors and  animosity between the patient and her siblings.  Treatment Interventions:  Therapeutic interventions for issues related to chronic pain/fibromyalgia and recurrent headaches and depression anxiety symptoms with significant psychosocial stressors.  Participation Level:   Active  Participation Quality:  Appropriate and Attentive      Behavioral Observation:  Well Groomed, Alert, and Appropriate.   Current Psychosocial Factors: The patient reports that the primary stressors that she has been dealing with related to one of her sisters has diminished considerably since she had a confrontation with her around the Christmas holiday.  The sister has stopped interjecting into the patient's activities attempts at controlling the patient herself.  The patient reports that she has continued to have pain and they are now looking at her cervical spine for issues that could be causing her pain down her arms.  The patient reports that sleep has been disturbed with this chronic pain situation.  Content of Session:   Reviewed current symptoms and continue to develop coping skills around her chronic pain and fibromyalgia symptoms as well as headaches and dealing with cervical postlaminectomy syndrome.  We also worked on significant psychosocial stressors that are being impacted by the patient's limited physical functioning as well as her stressors having a significant deleterious on her overall pain symptoms.  Effectiveness of Interventions: The patient has been quite open and active in these therapeutic processes and rapport been easily established.  The patient's cognitive function is good and while stress affects her ability to stay focused this is not indicative of any type of  cognitive deficits with the patient.  The patient is already working on some of the initial issues we have focused around sleep hygiene, physical activity and other coping issues around her chronic pain symptoms.  Target Goals:   Goals include  building better coping and adaptive skills around her chronic pain symptoms and working on better pain management as well as working on significant psychosocial stressors that are negatively impacting her current medical issues.  Goals Last Reviewed:   02/18/2021  Goals Addressed Today:    Today we worked a lot on the various stressors in her life that are exacerbating her chronic pain symptoms.  Impression/Diagnosis:   Robin Morgan. [name] is a 65 year old right-handed female referred by Dr. Naaman Plummer for neuropsychological/psychological consultation due to significant depression and anxiety with primary stressors related to family issues in the setting of fibromyalgia and chronic severe pain symptoms.  The patient reports that she has had significant pain in her back primarily mid back for 20 years or more.  The patient is also been diagnosed with fibromyalgia.  The patient had surgery on her neck to try to alleviate help with her severe recurrent headaches but experience little change post surgery.  The patient also has significant shoulder pain and severe headache with these pain and headaches resulting in her having to spend multiple days at a time essentially in bed.   I do think that the patient's anxiety, stress and depressive symptoms do play and exacerbating role in her overall pain symptoms and while she clearly has abnormalities in lumbar and cervical regions as a primary factor for her pain symptoms her stress responses, fibromyalgia and depression do play a role in the acute day-to-day level of her pain.  Today we have continue to work on therapeutic interventions around chronic pain and the patient taking better care of her self particular around issues of depression and stress.  Diagnosis:   Chronic pain syndrome  Cervical disc disorder with radiculopathy of cervical region  Fibromyalgia  DDD (degenerative disc disease), lumbar  Depression, recurrent ([place])    Ilean Skill,  Psy.D. Clinical Psychologist Neuropsychologist

## 2021-02-24 DIAGNOSIS — M5412 Radiculopathy, cervical region: Secondary | ICD-10-CM | POA: Diagnosis not present

## 2021-02-28 ENCOUNTER — Telehealth: Payer: Self-pay | Admitting: *Deleted

## 2021-02-28 NOTE — Telephone Encounter (Signed)
Urine drug screen for this encounter is consistent for prescribed medication 

## 2021-03-10 DIAGNOSIS — M79622 Pain in left upper arm: Secondary | ICD-10-CM | POA: Diagnosis not present

## 2021-03-10 DIAGNOSIS — M5412 Radiculopathy, cervical region: Secondary | ICD-10-CM | POA: Diagnosis not present

## 2021-03-10 DIAGNOSIS — M79621 Pain in right upper arm: Secondary | ICD-10-CM | POA: Diagnosis not present

## 2021-03-10 DIAGNOSIS — M542 Cervicalgia: Secondary | ICD-10-CM | POA: Diagnosis not present

## 2021-03-11 ENCOUNTER — Other Ambulatory Visit: Payer: Self-pay | Admitting: Registered Nurse

## 2021-03-11 ENCOUNTER — Other Ambulatory Visit: Payer: Self-pay | Admitting: Family Medicine

## 2021-03-11 DIAGNOSIS — M797 Fibromyalgia: Secondary | ICD-10-CM

## 2021-03-12 DIAGNOSIS — M542 Cervicalgia: Secondary | ICD-10-CM | POA: Diagnosis not present

## 2021-03-12 DIAGNOSIS — M79622 Pain in left upper arm: Secondary | ICD-10-CM | POA: Diagnosis not present

## 2021-03-12 DIAGNOSIS — M79621 Pain in right upper arm: Secondary | ICD-10-CM | POA: Diagnosis not present

## 2021-03-12 DIAGNOSIS — M5412 Radiculopathy, cervical region: Secondary | ICD-10-CM | POA: Diagnosis not present

## 2021-03-19 ENCOUNTER — Other Ambulatory Visit: Payer: Self-pay | Admitting: Physical Medicine & Rehabilitation

## 2021-03-19 DIAGNOSIS — M797 Fibromyalgia: Secondary | ICD-10-CM

## 2021-03-19 DIAGNOSIS — M7918 Myalgia, other site: Secondary | ICD-10-CM

## 2021-03-22 ENCOUNTER — Other Ambulatory Visit: Payer: Self-pay | Admitting: Family Medicine

## 2021-03-24 ENCOUNTER — Encounter: Payer: Medicare HMO | Attending: Physical Medicine and Rehabilitation | Admitting: Psychology

## 2021-03-24 ENCOUNTER — Other Ambulatory Visit: Payer: Self-pay

## 2021-03-24 DIAGNOSIS — G894 Chronic pain syndrome: Secondary | ICD-10-CM

## 2021-03-24 DIAGNOSIS — F339 Major depressive disorder, recurrent, unspecified: Secondary | ICD-10-CM | POA: Diagnosis not present

## 2021-03-24 DIAGNOSIS — M5136 Other intervertebral disc degeneration, lumbar region: Secondary | ICD-10-CM | POA: Diagnosis not present

## 2021-03-24 DIAGNOSIS — M7918 Myalgia, other site: Secondary | ICD-10-CM | POA: Diagnosis not present

## 2021-03-24 DIAGNOSIS — M5010 Cervical disc disorder with radiculopathy, unspecified cervical region: Secondary | ICD-10-CM | POA: Diagnosis not present

## 2021-03-24 DIAGNOSIS — Z79891 Long term (current) use of opiate analgesic: Secondary | ICD-10-CM | POA: Diagnosis not present

## 2021-03-24 DIAGNOSIS — M797 Fibromyalgia: Secondary | ICD-10-CM | POA: Diagnosis not present

## 2021-03-24 DIAGNOSIS — Z5181 Encounter for therapeutic drug level monitoring: Secondary | ICD-10-CM | POA: Insufficient documentation

## 2021-03-25 IMAGING — CR DG SHOULDER 2+V*R*
3 series · 3 of 3 positions shown · non-contrast
Comparison: None.

CLINICAL DATA: Severe pain

EXAM:
RIGHT SHOULDER - 2+ VIEW

[w shoulder grashey right]
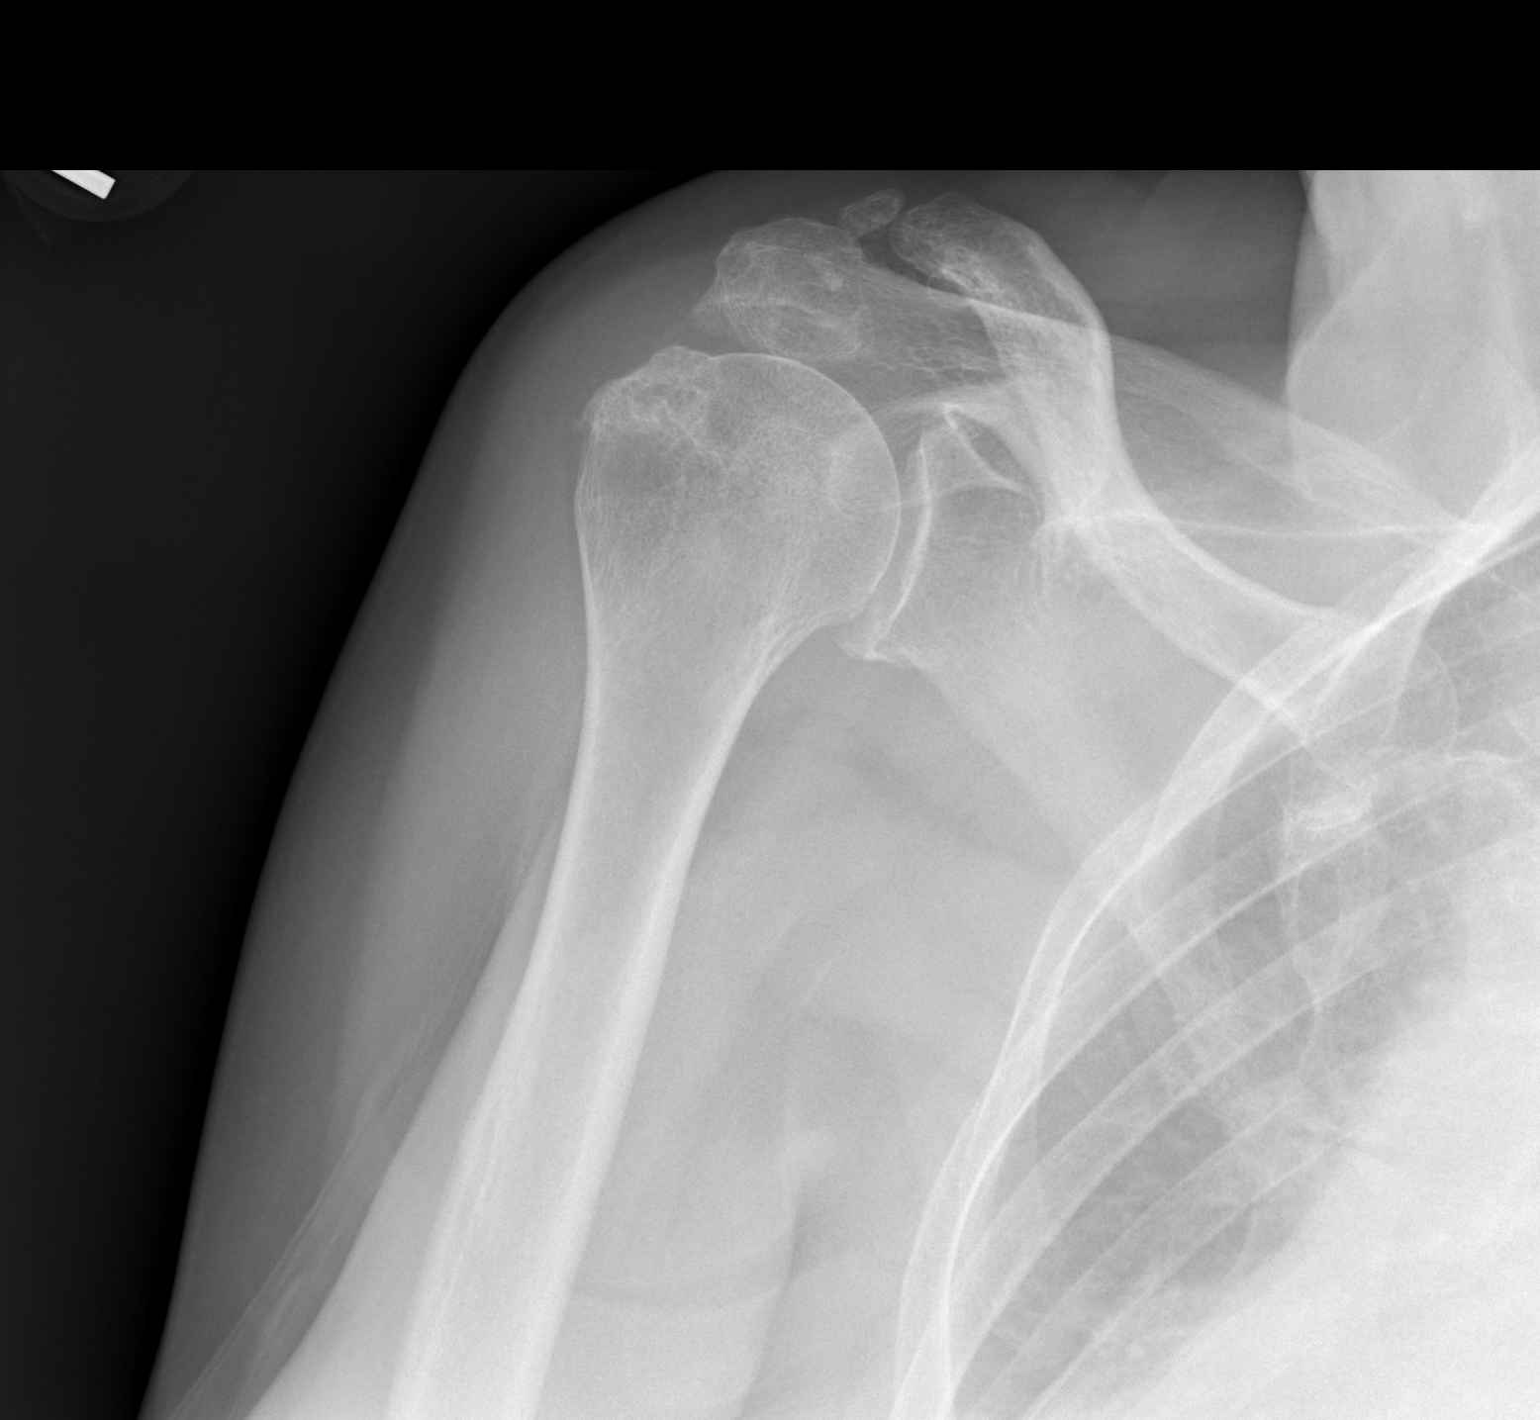

[w shoulder y-view right]
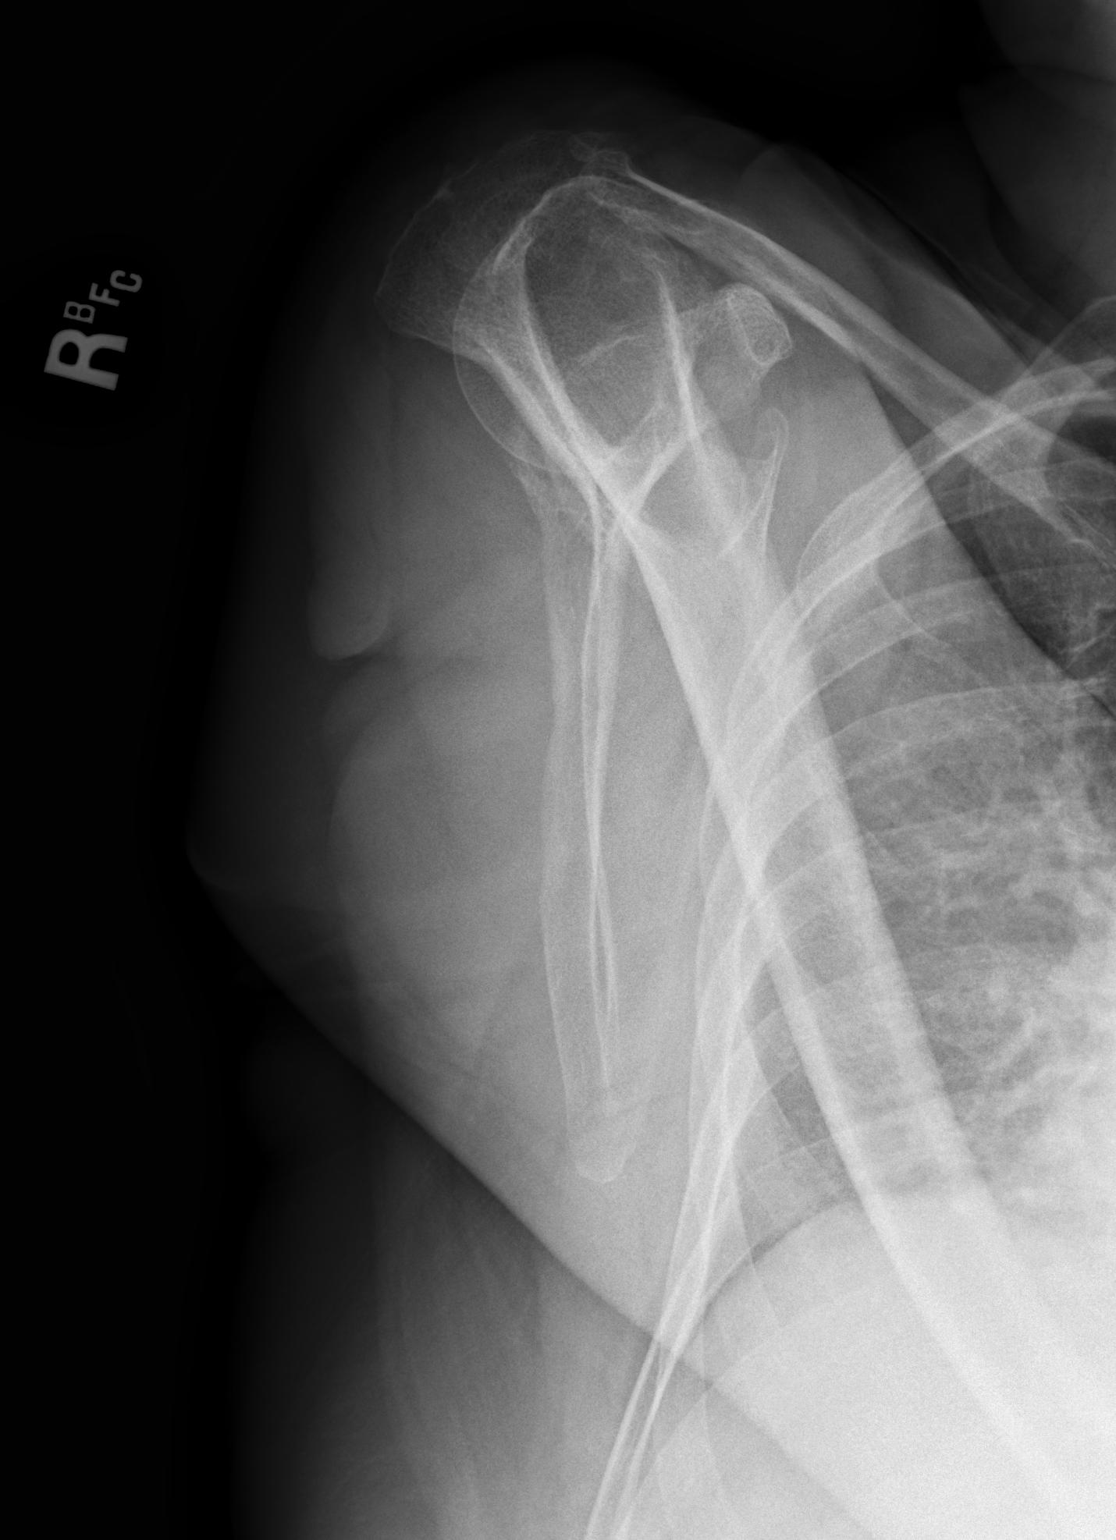

[x shoulder axillary right]
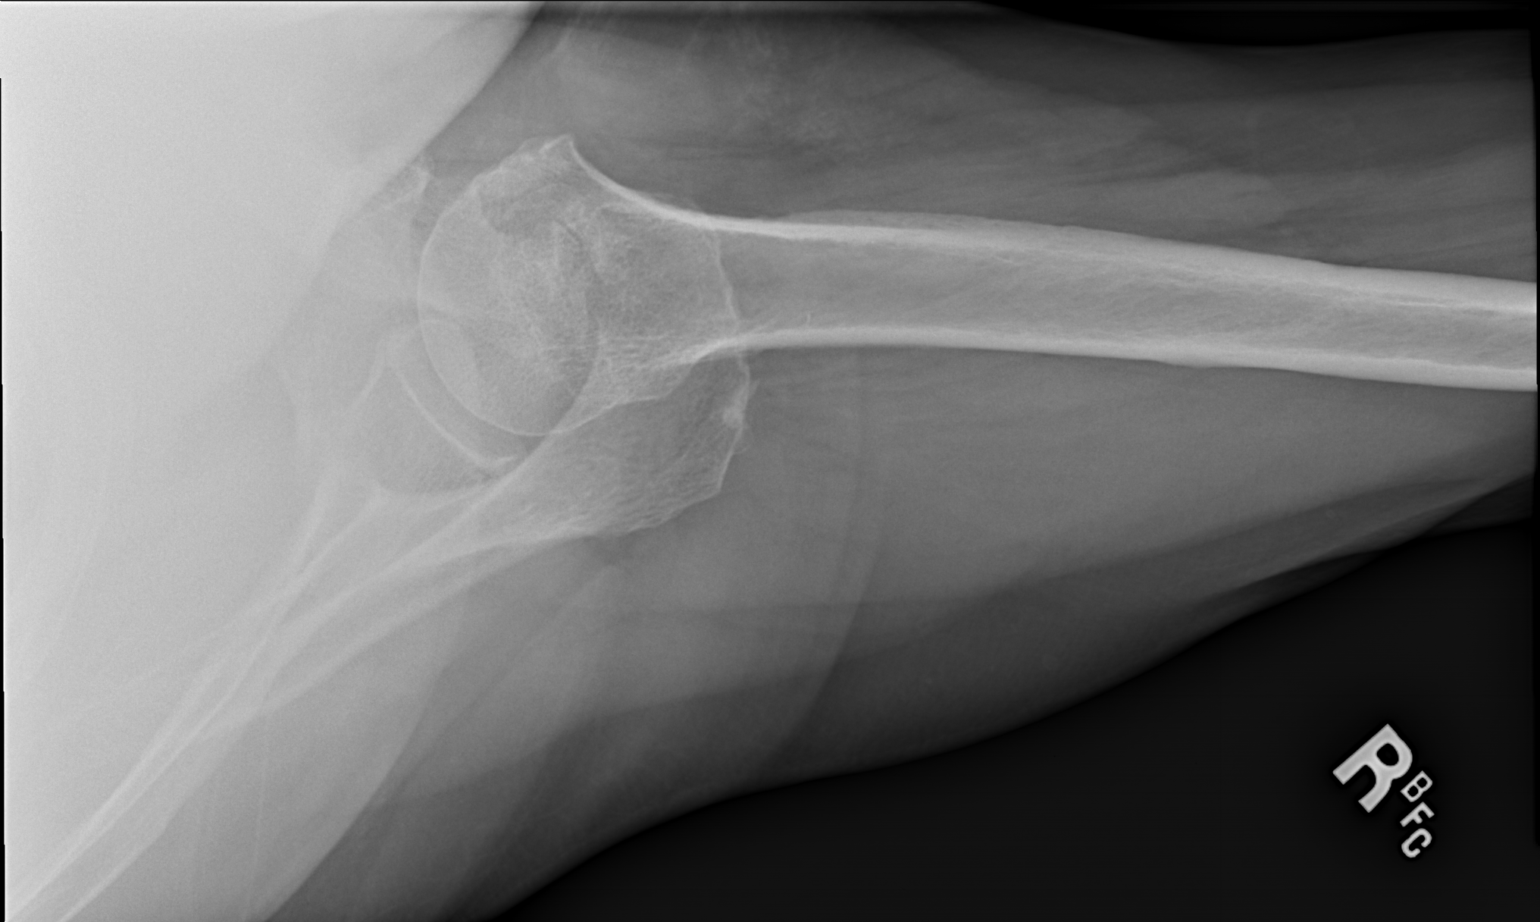

[3 of 3 positions shown; findings below may reference images not displayed]

FINDINGS: No acute fracture. Glenohumeral joint intact. Chronic changes
related AC separation. Osteophytes along the greater tuberosity.
IMPRESSION: No acute findings.  AC joint arthropathy related to remote trauma.

## 2021-04-18 NOTE — Telephone Encounter (Signed)
This encounter was opened in error. No patient interaction occurred.   

## 2021-04-22 DIAGNOSIS — Z961 Presence of intraocular lens: Secondary | ICD-10-CM | POA: Diagnosis not present

## 2021-04-22 DIAGNOSIS — D3131 Benign neoplasm of right choroid: Secondary | ICD-10-CM | POA: Diagnosis not present

## 2021-04-22 DIAGNOSIS — H04123 Dry eye syndrome of bilateral lacrimal glands: Secondary | ICD-10-CM | POA: Diagnosis not present

## 2021-04-22 LAB — HM DIABETES EYE EXAM

## 2021-04-23 ENCOUNTER — Other Ambulatory Visit: Payer: Self-pay

## 2021-04-23 ENCOUNTER — Encounter: Payer: Medicare HMO | Attending: Physical Medicine and Rehabilitation | Admitting: Physical Medicine & Rehabilitation

## 2021-04-23 ENCOUNTER — Encounter: Payer: Self-pay | Admitting: Physical Medicine & Rehabilitation

## 2021-04-23 VITALS — BP 119/66 | HR 103 | Temp 98.4°F | Ht 61.0 in | Wt 129.0 lb

## 2021-04-23 DIAGNOSIS — M5010 Cervical disc disorder with radiculopathy, unspecified cervical region: Secondary | ICD-10-CM | POA: Diagnosis not present

## 2021-04-23 DIAGNOSIS — G43019 Migraine without aura, intractable, without status migrainosus: Secondary | ICD-10-CM | POA: Insufficient documentation

## 2021-04-23 DIAGNOSIS — M797 Fibromyalgia: Secondary | ICD-10-CM | POA: Insufficient documentation

## 2021-04-23 DIAGNOSIS — M7918 Myalgia, other site: Secondary | ICD-10-CM | POA: Diagnosis not present

## 2021-04-23 DIAGNOSIS — G894 Chronic pain syndrome: Secondary | ICD-10-CM | POA: Diagnosis not present

## 2021-04-23 MED ORDER — MORPHINE SULFATE 15 MG PO TABS: ORAL_TABLET | ORAL | 0 refills | Status: DC

## 2021-04-23 MED ORDER — TOPIRAMATE 50 MG PO TABS: 50.0000 mg | ORAL_TABLET | Freq: Every day | ORAL | 4 refills | Status: DC

## 2021-04-23 NOTE — Progress Notes (Signed)
Subjective:    Patient ID: Robin Morgan, female    DOB: March 13, 1956, 65 y.o.   MRN: [id number]  HPI  Robin Morgan is back regarding her chronic pain. Her cervical injections did not have any benefit. She is still having pain down there left arm. She is falling and having more problems with transfers, bending over, etc. She feels that she lacks energy and strength at times. Sleep is fair but she's on ambien '5mg'$  at beditme.  She often feels tired when she wakes up in the morning.  She still deals with some stresses from her family although has had better coping skills of late.  She saw Dr. Sima Matas for counseling and found that this was beneficial.  For pain she is using morphine immediate release 15 mg 2-3 times per day.  She also is on Cymbalta 90 mg daily and gabapentin.  600 mg 3 times daily she takes Topamax 25 mg at bedtime as well as tizanidine.     Pain Inventory Average Pain 10 Pain Right Now 8 My pain is constant, stabbing, and aching  In the last 24 hours, has pain interfered with the following? General activity 10 Relation with others 10 Enjoyment of life 10 What TIME of day is your pain at its worst? evening and night Sleep (in general)  very poor  Pain is worse with: bending, sitting, and standing Pain improves with: medication Relief from Meds: 6  Family History  Problem Relation Age of Onset   Alzheimer's disease Mother    Diabetes Father    Heart disease Maternal Grandmother    Breast cancer Paternal Grandmother 18   Social History   Socioeconomic History   Marital status: Divorced    Spouse name: Not on file   Number of children: 1   Years of education: Not on file   Highest education level: Not on file  Occupational History   Occupation: disabled  Tobacco Use   Smoking status: Never   Smokeless tobacco: Never  Vaping Use   Vaping Use: Never used  Substance and Sexual Activity   Alcohol use: No   Drug use: No   Sexual activity: Not Currently  Other  Topics Concern   Not on file  Social History Narrative   Not on file   Social Determinants of Health   Financial Resource Strain: Low Risk    Difficulty of Paying Living Expenses: Not hard at all  Food Insecurity: No Food Insecurity   Worried About Charity fundraiser in the Last Year: Never true   Ran Out of Food in the Last Year: Never true  Transportation Needs: No Transportation Needs   Lack of Transportation (Medical): No   Lack of Transportation (Non-Medical): No  Physical Activity: Inactive   Days of Exercise per Week: 0 days   Minutes of Exercise per Session: 0 min  Stress: No Stress Concern Present   Feeling of Stress : Only a little  Social Connections: Socially Isolated   Frequency of Communication with Friends and Family: Twice a week   Frequency of Social Gatherings with Friends and Family: Once a week   Attends Religious Services: Never   Marine scientist or Organizations: No   Attends Archivist Meetings: Never   Marital Status: Divorced   Past Surgical History:  Procedure Laterality Date   [place] EXCISIONAL BIOPSY Right 1990's   BREAST  SURGERY     rt breast cyst done in the 90's   [place]     right   [place]   Past Surgical History:  Procedure Laterality Date   ABDOMINAL [place]   BREAST EXCISIONAL BIOPSY Right 1990's   BREAST SURGERY     rt breast cyst done in the 90's   [place]     right   SPINE SURGERY     TONSILLECTOMY     1973   Past Medical History:  Diagnosis Date   Calcific tendonitis    Cervical spondylosis without myelopathy    Depression    Diabetes mellitus    Esophageal stricture    Fibromyalgia    GERD (gastroesophageal reflux disease)    Hiatal  hernia    Hyperlipidemia    Hypertension    BP 119/66   Pulse (!) 103   Temp 98.4 F (36.9 C)   Ht '5\' 1"'$  (1.549 m)   Wt 129 lb (58.5 kg)   SpO2 95%   BMI 24.37 kg/m   Opioid Risk Score:   Fall Risk Score:  `1  Depression screen PHQ 2/9  Depression screen [facility] 2/9 12/06/2020 09/20/2020 02/19/2020 12/06/2019 12/01/2019 09/27/2019 06/16/2018  Decreased Interest 0 '3 3 3 2 3 '$ 0  Down, Depressed, Hopeless '1 2 3 3 1 3 '$ 0  PHQ - 2 Score '1 5 6 6 3 6 '$ 0  Altered sleeping - 3 3 - 2 - 0  Tired, decreased energy - 3 3 - 3 - 0  Change in appetite - 3 3 - 2 - 0  Feeling bad or failure about yourself  - 0 1 - 3 - 0  Trouble concentrating - 0 3 - 2 - 0  Moving slowly or fidgety/restless - 0 0 - 1 - 0  Suicidal thoughts - 0 0 - 0 - 0  PHQ-9 Score - 14 19 - 16 - 0  Difficult doing work/chores - Somewhat difficult Very difficult - Very difficult - Not difficult at all  Some recent data might be hidden    Review of Systems  Musculoskeletal:  Positive for back pain.       Pain in upper left arm, left shoulder  All other systems reviewed and are negative.     Objective:   Physical Exam  General: No acute distress HEENT: NCAT, EOMI, oral membranes moist Cards: reg rate  Chest: normal effort Abdomen: Soft, NT, ND Skin: dry, intact Extremities: no edema Psych: pleasant and appropriate  Neuro:  Patient is cognitively appropriate.  Neuro cranial nerve exam.  Normal insight and awareness.  Strength is grossly 4 out of 5 in both upper extremities proximal distal.  Reflexes are 1-2+.  Sensory remains intact in all 4's. . Musculoskeletal:  traps, ls, rhomboids tight. again   right shoulder tender with ROM. Neck ROM sl limited in rotation and flexion/extension             Assessment & Plan:     1. Fibromyalgia/myofascial pain involving the left shoulder girdle, particularly the rhomboid area. Continue with home exercise program.  I do feel that some of her symptoms recently are more related to her  fibromyalgia syndrome.  I want her getting outside, getting out in the sun especially since the weather is improved.  Also would like her to work on some leisure activities things that make her happy.  2. Rotator cuff syndrome bilaterally.            -HEP            -s/p injection with some benefit 3. Cervicalgia. Post-laminectomy syndrome, facet arthropathy:  Cervical Radiculopathy:     Continue Gabapentin.           -MS IR '15mg'$  q12 prn #80. RF today             We will continue the controlled substance monitoring program, this consists of regular clinic visits, examinations, routine drug screening, pill counts as well as use of [place] Controlled Substance Reporting System. [place] today.             -? cervical MBB's of cervical spine        -C7-T1 ESI without beneift. Repeat injections with some benefit           C5-6 foraminal stenosis on MRI-ESI's without much benefit             -Surgical follow-up is pending 4. Depression/sleep: Continue Cymbalta.   '60mg'$  bid        -continue neuropsych for pain mgt techniques, coping skills==very helpful  -Wean off Ambien melatonin 3 to 6 mg at bedtime.  Additionally will increase Topamax up to 50 mg. 6. Lumbar Radiculopathy:  HEP 7 OA of right hand:    Voltaren gel and heat therapy.              . 8. Migraines: stable, largely driven by shoulder girdle and neck pain.             -Increase topamax to '50mg'$  qhs as she was only taking 25 mg still.    9. Left CTS:S/P EMG on 03/08/2017: S/P Carpal Tunnel Release on 06/14/2017 via . Dr. Apolonio Schneiders.  10. OIC: movantik           -continue diet/probiotic and diet  Fifteen minutes of face to face patient care time were spent during this visit. All questions were encouraged and answered.  Follow up with me in 2 mos .

## 2021-04-23 NOTE — Patient Instructions (Addendum)
PLEASE FEEL FREE TO CALL OUR OFFICE WITH ANY PROBLEMS OR QUESTIONS [id number])  INCREASE TOPAMAX TO '50MG'$  AT BEDTIME IN 3 DAYS STOP AMBIEN START MELATONIN 3-'6MG'$  AT BEDTIME

## 2021-04-29 ENCOUNTER — Encounter: Payer: Self-pay | Admitting: Family Medicine

## 2021-05-04 ENCOUNTER — Encounter: Payer: Self-pay | Admitting: Psychology

## 2021-05-04 NOTE — Progress Notes (Signed)
Neuropsychology Visit  Patient:  Robin Morgan   DOB: Oct 07, 1955  MR Number: [id number]  Location: [facility] [place], [place] [id number] [place] [id number] Dept: [phone]  Date of Service: 03/24/2021  Start: 1 PM End: 2 PM  Duration of Service: 1 Hour  Today's visit was conducted in my outpatient clinic office with the patient myself present.  Today's visit was an in person visit.  Provider/Observer:     Edgardo Roys PsyD  Chief Complaint:      Chief Complaint  Patient presents with   Back Pain   Pain   Anxiety   Depression    Reason For Service:     Robin Morgan is a 65 year old right-handed female referred by Dr. Naaman Plummer for neuropsychological/psychological consultation due to significant depression and anxiety with primary stressors related to family issues in the setting of fibromyalgia and chronic severe pain symptoms.  The patient reports that she has had significant pain in her back primarily mid back for 20 years or more.  The patient is also been diagnosed with fibromyalgia.  The patient had surgery on her neck to try to alleviate help with her severe recurrent headaches but experience little change post surgery.  The patient also has significant shoulder pain and severe headache with these pain and headaches resulting in her having to spend multiple days at a time essentially in bed.  The patient is continued to struggle with anxiety and depressive symptoms on top of her chronic pain disorder.  The patient was asked to help take care of her father who has dementia and her sisters expected her to stay there for an extended period of time.  The patient was there for almost a week experiencing increasing problems due to poor sleeping conditions and an exacerbation of her underlying chronic pain symptoms.  There continues to be stressors and  animosity between the patient and her siblings.  Treatment Interventions:  Therapeutic interventions for issues related to chronic pain/fibromyalgia and recurrent headaches and depression anxiety symptoms with significant psychosocial stressors.  Participation Level:   Active  Participation Quality:  Appropriate and Attentive      Behavioral Observation:  Well Groomed, Alert, and Appropriate.   Current Psychosocial Factors: The patient reports that the primary stressors that she has been dealing with related to one of her sisters has diminished considerably since she had a confrontation with her around the Christmas holiday.  The sister has stopped interjecting into the patient's activities attempts at controlling the patient herself.  The patient reports that she has continued to have pain and they are now looking at her cervical spine for issues that could be causing her pain down her arms.  The patient reports that sleep has been disturbed with this chronic pain situation.  Content of Session:   Reviewed current symptoms and continue to develop coping skills around her chronic pain and fibromyalgia symptoms as well as headaches and dealing with cervical postlaminectomy syndrome.  We also worked on significant psychosocial stressors that are being impacted by the patient's limited physical functioning as well as her stressors having a significant deleterious on her overall pain symptoms.  Effectiveness of Interventions: The patient has been quite open and active in these therapeutic processes and rapport been easily established.  The patient's cognitive function is good and while stress affects her ability to stay focused this is not indicative of any type of  cognitive deficits with the patient.  The patient is already working on some of the initial issues we have focused around sleep hygiene, physical activity and other coping issues around her chronic pain symptoms.  Target Goals:   Goals include  building better coping and adaptive skills around her chronic pain symptoms and working on better pain management as well as working on significant psychosocial stressors that are negatively impacting her current medical issues.  Goals Last Reviewed:   03/24/2021  Goals Addressed Today:    Today we worked a lot on the various stressors in her life that are exacerbating her chronic pain symptoms.  Impression/Diagnosis:   Robin Morgan is a 65 year old right-handed female referred by Dr. Naaman Plummer for neuropsychological/psychological consultation due to significant depression and anxiety with primary stressors related to family issues in the setting of fibromyalgia and chronic severe pain symptoms.  The patient reports that she has had significant pain in her back primarily mid back for 20 years or more.  The patient is also been diagnosed with fibromyalgia.  The patient had surgery on her neck to try to alleviate help with her severe recurrent headaches but experience little change post surgery.  The patient also has significant shoulder pain and severe headache with these pain and headaches resulting in her having to spend multiple days at a time essentially in bed.   I do think that the patient's anxiety, stress and depressive symptoms do play and exacerbating role in her overall pain symptoms and while she clearly has abnormalities in lumbar and cervical regions as a primary factor for her pain symptoms her stress responses, fibromyalgia and depression do play a role in the acute day-to-day level of her pain.  Today we have continue to work on therapeutic interventions around chronic pain and the patient taking better care of her self particular around issues of depression and stress.  Diagnosis:   Chronic pain syndrome  Cervical disc disorder with radiculopathy of cervical region  Fibromyalgia  Depression, recurrent (HCC)    Ilean Skill, Psy.D. Clinical  Psychologist Neuropsychologist

## 2021-05-15 ENCOUNTER — Telehealth: Payer: Self-pay | Admitting: Family Medicine

## 2021-05-15 NOTE — Progress Notes (Signed)
  Care Management   Follow Up Note   05/15/2021 Name: MAKIYLA LINCH MRN: [id number] DOB: 1955/09/27   Referred by: Leamon Arnt, MD Reason for referral : No chief complaint on file.   An unsuccessful telephone outreach was attempted today. The patient was referred to the case management team for assistance with care management and care coordination.   Follow Up Plan: No further follow up required:    [place]

## 2021-05-21 ENCOUNTER — Other Ambulatory Visit: Payer: Self-pay | Admitting: Family Medicine

## 2021-05-22 ENCOUNTER — Other Ambulatory Visit: Payer: Self-pay | Admitting: Family Medicine

## 2021-05-26 ENCOUNTER — Telehealth: Payer: Self-pay | Admitting: Family Medicine

## 2021-05-26 NOTE — Chronic Care Management (AMB) (Signed)
  Care Management  Note   05/26/2021 Name: Robin Morgan MRN: [id number] DOB: 1956-02-04  Robin Morgan is a 65 y.o. year old female who is a primary care patient of Leamon Arnt, MD. The care management team was consulted for assistance with chronic disease management and care coordination needs.   Ms. Bring was given information about Care Management services today including:  CCM service includes personalized support from designated clinical staff supervised by the physician, including individualized plan of care and coordination with other care providers 24/7 contact phone numbers for assistance for urgent and routine care needs. Service will only be billed when office clinical staff spend 20 minutes or more in a month to coordinate care. Only one practitioner may furnish and bill the service in a calendar month. The patient may stop CCM services at amy time (effective at the end of the month) by phone call to the office staff. The patient will be responsible for cost sharing (co-pay) or up to 20% of the service fee (after annual deductible is met)  Patient agreed to services and verbal consent obtained.  Follow up plan:   Face to Face appointment with care management team member scheduled for: 07/22/21 $RemoveBefor'@11am'[email or web address]$   Noelle Penner Upstream Scheduler

## 2021-06-16 ENCOUNTER — Encounter: Payer: Medicare HMO | Attending: Physical Medicine and Rehabilitation | Admitting: Psychology

## 2021-06-16 ENCOUNTER — Other Ambulatory Visit: Payer: Self-pay

## 2021-06-16 DIAGNOSIS — Z5181 Encounter for therapeutic drug level monitoring: Secondary | ICD-10-CM | POA: Insufficient documentation

## 2021-06-16 DIAGNOSIS — R69 Illness, unspecified: Secondary | ICD-10-CM | POA: Diagnosis not present

## 2021-06-16 DIAGNOSIS — G894 Chronic pain syndrome: Secondary | ICD-10-CM | POA: Insufficient documentation

## 2021-06-16 DIAGNOSIS — F339 Major depressive disorder, recurrent, unspecified: Secondary | ICD-10-CM | POA: Diagnosis not present

## 2021-06-16 DIAGNOSIS — M797 Fibromyalgia: Secondary | ICD-10-CM | POA: Insufficient documentation

## 2021-06-16 DIAGNOSIS — G43019 Migraine without aura, intractable, without status migrainosus: Secondary | ICD-10-CM | POA: Diagnosis not present

## 2021-06-16 DIAGNOSIS — Z79899 Other long term (current) drug therapy: Secondary | ICD-10-CM | POA: Diagnosis not present

## 2021-06-16 DIAGNOSIS — M5136 Other intervertebral disc degeneration, lumbar region: Secondary | ICD-10-CM | POA: Insufficient documentation

## 2021-06-16 DIAGNOSIS — M7918 Myalgia, other site: Secondary | ICD-10-CM | POA: Insufficient documentation

## 2021-06-16 DIAGNOSIS — M5010 Cervical disc disorder with radiculopathy, unspecified cervical region: Secondary | ICD-10-CM | POA: Insufficient documentation

## 2021-06-17 ENCOUNTER — Encounter: Payer: Self-pay | Admitting: Psychology

## 2021-06-17 NOTE — Progress Notes (Signed)
Neuropsychology Visit  Patient:  Robin Morgan   DOB: Jun 03, 1956  MR Number: [id number]  Location: [facility] Robin Morgan, [name] Dept: [phone]  Date of Service: 06/17/2021  Start: 3 PM End: 4 PM  Duration of Service: 1 Hour  Today's visit was conducted in my outpatient clinic office with the patient myself present.  Today's visit was an in person visit.  Provider/Observer:     Robin Roys PsyD  Chief Complaint:      Chief Complaint  Patient presents with   Back Pain   Pain   Anxiety   Depression    Reason For Service:     Robin Morgan is a 65 year old right-handed female referred by Dr. Naaman Morgan for neuropsychological/psychological consultation due to significant depression and anxiety with primary stressors related to family issues in the setting of fibromyalgia and chronic severe pain symptoms.  The patient reports that she has had significant pain in her back primarily mid back for 20 years or more.  The patient is also been diagnosed with fibromyalgia.  The patient had surgery on her neck to try to alleviate help with her severe recurrent headaches but experience little change post surgery.  The patient also has significant shoulder pain and severe headache with these pain and headaches resulting in her having to spend multiple days at a time essentially in bed.  The patient is continued to struggle with anxiety and depressive symptoms on top of her chronic pain disorder.  The patient was asked to help take care of her father who has dementia and her sisters expected her to stay there for an extended period of time.  The patient was there for almost a week experiencing increasing problems due to poor sleeping conditions and an exacerbation of her underlying chronic pain symptoms.  There continues to be stressors and  animosity between the patient and her siblings.  There continues to be a lot of stress around what is happening with the care of her father and the relationship dynamics between the patient and her 2 sisters.  The patient herself has significant pain and is unable to physically help much with her father but particularly her younger sister is demanding and manipulative of the patient to try to be able to do more.  The patient's description of the younger sister present a picture of a very manipulative narcissistic like behavioral pattern in the younger sister and it is very difficult for her and the 2 other sisters to engage and have the younger sister react appropriately.  Treatment Interventions:  Therapeutic interventions for issues related to chronic pain/fibromyalgia and recurrent headaches and depression anxiety symptoms with significant psychosocial stressors.  Participation Level:   Active  Participation Quality:  Appropriate and Attentive      Behavioral Observation:  Well Groomed, Alert, and Appropriate.   Current Psychosocial Factors: The patient reports that there continue to be considerable psychosocial stressors.  The patient has agreed to come and help a little bit more with her father but the father and the younger sister are both very manipulative and hard to cope with.  Content of Session:   Reviewed current symptoms and continue to develop coping skills around her chronic pain and fibromyalgia symptoms as well as headaches and dealing with cervical postlaminectomy syndrome.  We also worked on significant psychosocial stressors that are being impacted by the patient's limited physical  functioning as well as her stressors having a significant deleterious on her overall pain symptoms.  Effectiveness of Interventions: The patient has been quite open and active in these therapeutic processes and rapport been easily established.  The patient's cognitive function is good and while stress  affects her ability to stay focused this is not indicative of any type of cognitive deficits with the patient.  The patient is already working on some of the initial issues we have focused around sleep hygiene, physical activity and other coping issues around her chronic pain symptoms.  Target Goals:   Goals include building better coping and adaptive skills around her chronic pain symptoms and working on better pain management as well as working on significant psychosocial stressors that are negatively impacting her current medical issues.  Goals Last Reviewed:   03/24/2021  Goals Addressed Today:    Today we worked a lot on the various stressors in her life that are exacerbating her chronic pain symptoms.  Impression/Diagnosis:   Robin Morgan is a 65 year old right-handed female referred by Dr. Naaman Morgan for neuropsychological/psychological consultation due to significant depression and anxiety with primary stressors related to family issues in the setting of fibromyalgia and chronic severe pain symptoms.  The patient reports that she has had significant pain in her back primarily mid back for 20 years or more.  The patient is also been diagnosed with fibromyalgia.  The patient had surgery on her neck to try to alleviate help with her severe recurrent headaches but experience little change post surgery.  The patient also has significant shoulder pain and severe headache with these pain and headaches resulting in her having to spend multiple days at a time essentially in bed.   I do think that the patient's anxiety, stress and depressive symptoms do play and exacerbating role in her overall pain symptoms and while she clearly has abnormalities in lumbar and cervical regions as a primary factor for her pain symptoms her stress responses, fibromyalgia and depression do play a role in the acute day-to-day level of her pain.  Today we have continue to work on therapeutic interventions around chronic pain  and the patient taking better care of her self particular around issues of depression and stress.  Diagnosis:   Cervical disc disorder with radiculopathy of cervical region  Chronic pain associated with significant psychosocial dysfunction  Migraine without aura, intractable, without status migrainosus  Fibromyalgia  Depression, recurrent (HCC)    Ilean Skill, Psy.D. Clinical Psychologist Neuropsychologist

## 2021-06-18 ENCOUNTER — Telehealth: Payer: Self-pay | Admitting: Family Medicine

## 2021-06-18 NOTE — Progress Notes (Signed)
  Care Management  Note   06/18/2021 Name: Robin Morgan MRN: [id number] DOB: [birth year]  Robin Morgan is a 65 y.o. year old female who is a primary care patient of Leamon Arnt, MD. The care management team was consulted for assistance with chronic disease management and care coordination needs.   Robin Morgan was given information about Care Management services today including:  CCM service includes personalized support from designated clinical staff supervised by the physician, including individualized plan of care and coordination with other care providers 24/7 contact phone numbers for assistance for urgent and routine care needs. Service will only be billed when office clinical staff spend 20 minutes or more in a month to coordinate care. Only one practitioner may furnish and bill the service in a calendar month. The patient may stop CCM services at amy time (effective at the end of the month) by phone call to the office staff. The patient will be responsible for cost sharing (co-pay) or up to 20% of the service fee (after annual deductible is met)  Patient agreed to services and verbal consent obtained.  Follow up plan:   Face to Face appointment with care management team member scheduled for: 07/22/21 _0   Noelle Penner Upstream Scheduler

## 2021-07-02 ENCOUNTER — Encounter: Payer: Self-pay | Admitting: Physical Medicine & Rehabilitation

## 2021-07-02 ENCOUNTER — Other Ambulatory Visit: Payer: Self-pay

## 2021-07-02 ENCOUNTER — Encounter (HOSPITAL_BASED_OUTPATIENT_CLINIC_OR_DEPARTMENT_OTHER): Payer: Medicare HMO | Admitting: Physical Medicine & Rehabilitation

## 2021-07-02 VITALS — BP 105/65 | HR 92 | Temp 97.8°F | Ht 61.0 in | Wt 131.0 lb

## 2021-07-02 DIAGNOSIS — Z5181 Encounter for therapeutic drug level monitoring: Secondary | ICD-10-CM | POA: Diagnosis not present

## 2021-07-02 DIAGNOSIS — G43019 Migraine without aura, intractable, without status migrainosus: Secondary | ICD-10-CM

## 2021-07-02 DIAGNOSIS — R69 Illness, unspecified: Secondary | ICD-10-CM | POA: Diagnosis not present

## 2021-07-02 DIAGNOSIS — M797 Fibromyalgia: Secondary | ICD-10-CM

## 2021-07-02 DIAGNOSIS — G894 Chronic pain syndrome: Secondary | ICD-10-CM

## 2021-07-02 DIAGNOSIS — M7918 Myalgia, other site: Secondary | ICD-10-CM

## 2021-07-02 DIAGNOSIS — M5136 Other intervertebral disc degeneration, lumbar region: Secondary | ICD-10-CM

## 2021-07-02 DIAGNOSIS — M5010 Cervical disc disorder with radiculopathy, unspecified cervical region: Secondary | ICD-10-CM

## 2021-07-02 DIAGNOSIS — Z79899 Other long term (current) drug therapy: Secondary | ICD-10-CM | POA: Diagnosis not present

## 2021-07-02 MED ORDER — TOPIRAMATE 50 MG PO TABS: 50.0000 mg | ORAL_TABLET | Freq: Every day | ORAL | 4 refills | Status: DC

## 2021-07-02 MED ORDER — MORPHINE SULFATE 15 MG PO TABS: ORAL_TABLET | ORAL | 0 refills | Status: DC

## 2021-07-02 NOTE — Progress Notes (Signed)
Subjective:    Patient ID: Robin Morgan, female    DOB: 19-Oct-1955, 65 y.o.   MRN: [id number]  HPI  Robin Morgan here in follow-up of her chronic pain syndrome.  Things been fairly stable since her last visit.  The increase in Topamax to 50 mg at bedtime has helped her migraine headache's.  She is reporting ongoing pain in her left shoulder.  She asked if her problems with her neck could be contributing to the left shoulder.  We discussed that a bit today.  She has some shoulder exercises but she is not doing them consistently.  She remains on morphine 15 mg immediate release for her pain control on this provide some quality of life for her.  She continues to deal from stresses at home related to her family.  This weighs heavily on her on a daily basis.  She reports normal bowel bladder habits.  Sleep is fair.   Pain Inventory Average Pain 9 Pain Right Now 8 My pain is constant, stabbing, and aching  In the last 24 hours, has pain interfered with the following? General activity 9 Relation with others 9 Enjoyment of life 9 What TIME of day is your pain at its worst? morning , evening, and night Sleep (in general) Fair  Pain is worse with: sitting and standing Pain improves with: rest and medication Relief from Meds: 6  Family History  Problem Relation Age of Onset   Alzheimer's disease Mother    Diabetes Father    Heart disease Maternal Grandmother    Breast cancer Paternal Grandmother 4   Social History   Socioeconomic History   Marital status: Divorced    Spouse name: Not on file   Number of children: 1   Years of education: Not on file   Highest education level: Not on file  Occupational History   Occupation: disabled  Tobacco Use   Smoking status: Never   Smokeless tobacco: Never  Vaping Use   Vaping Use: Never used  Substance and Sexual Activity   Alcohol use: No   Drug use: No   Sexual activity: Not Currently  Other Topics Concern   Not on file  Social  History Narrative   Not on file   Social Determinants of Health   Financial Resource Strain: Low Risk    Difficulty of Paying Living Expenses: Not hard at all  Food Insecurity: No Food Insecurity   Worried About Charity fundraiser in the Last Year: Never true   [place] in the Last Year: Never true  Transportation Needs: No Transportation Needs   Lack of Transportation (Medical): No   Lack of Transportation (Non-Medical): No  Physical Activity: Inactive   Days of Exercise per Week: 0 days   Minutes of Exercise per Session: 0 min  Stress: No Stress Concern Present   Feeling of Stress : Only a little  Social Connections: Socially Isolated   Frequency of Communication with Friends and Family: Twice a week   Frequency of Social Gatherings with Friends and Family: Once a week   Attends Religious Services: Never   Marine scientist or Organizations: No   Attends Archivist Meetings: Never   Marital Status: Divorced   Past Surgical History:  Procedure Laterality Date   [place] EXCISIONAL BIOPSY Right 1990's   BREAST SURGERY     rt breast cyst  done in the 42's   [place]     right   [place]   Past Surgical History:  Procedure Laterality Date   ABDOMINAL HYSTERECTOMY     1986   APPENDECTOMY     1983   BREAST EXCISIONAL BIOPSY Right 1990's   BREAST SURGERY     rt breast cyst done in the 90's   [place]     right   SPINE SURGERY     TONSILLECTOMY     1973   Past Medical History:  Diagnosis Date   Calcific tendonitis    Cervical spondylosis without myelopathy    Depression    Diabetes mellitus    Esophageal stricture    Fibromyalgia    GERD (gastroesophageal reflux disease)    Hiatal hernia    Hyperlipidemia    Hypertension     Ht 5\' 1"  (1.549 m)   Wt 131 lb (59.4 kg)   BMI 24.75 kg/m   Opioid Risk Score:   Fall Risk Score:  `1  Depression screen PHQ 2/9  Depression screen [facility] 2/9 07/02/2021 04/23/2021 12/06/2020 09/20/2020 02/19/2020 12/06/2019 12/01/2019  Decreased Interest 1 1 0 3 3 3 2   Down, Depressed, Hopeless 1 1 1 2 3 3 1   PHQ - 2 Score 2 2 1 5 6 6 3   Altered sleeping - - - 3 3 - 2  Tired, decreased energy - - - 3 3 - 3  Change in appetite - - - 3 3 - 2  Feeling bad or failure about yourself  - - - 0 1 - 3  Trouble concentrating - - - 0 3 - 2  Moving slowly or fidgety/restless - - - 0 0 - 1  Suicidal thoughts - - - 0 0 - 0  PHQ-9 Score - - - 14 19 - 16  Difficult doing work/chores - - - Somewhat difficult Very difficult - Very difficult  Some recent data might be hidden    Review of Systems  Musculoskeletal:  Positive for arthralgias and back pain.       Pain in both shoulder  All other systems reviewed and are negative.     Objective:   Physical Exam  General: No acute distress HEENT: NCAT, EOMI, oral membranes moist Cards: reg rate  Chest: normal effort Abdomen: Soft, NT, ND Skin: dry, intact Extremities: no edema Psych: pleasant and appropriate  Neuro:  Patient is cognitively appropriate.  Neuro cranial nerve exam.  Normal insight and awareness.  Strength is grossly 4 out of 5 in both upper extremities proximal distal.  Reflexes are 1-2+.  Sensory remains intact in all 4's. . Musculoskeletal: Ongoing tightness in her left trap and rhomboids.  It does affect her scapular motion during range of motion of her shoulder.  She has pretty good left humeral range of motion.  Inconsistent rotator cuff signs are seen today on the left.      Assessment & Plan:     1. Fibromyalgia/myofascial pain involving the left shoulder girdle, particularly the rhomboid area. Discussed HEP--needs to be more consistent with this.  Really needs to improve her scapular range of motion.  We discussed this at  length today.  She has a home exercise program which she can refer  to already.  2. Rotator cuff syndrome bilaterally.            -HEP            -s/p injection with some benefit 3. Cervicalgia. Post-laminectomy syndrome, facet arthropathy:  Cervical Radiculopathy:     Continue Gabapentin.           -MS IR 15mg  q12 prn #80. RF today            We will continue the controlled substance monitoring program, this consists of regular clinic visits, examinations, routine drug screening, pill counts as well as use of [place] Controlled Substance Reporting System. [place] today.    Medication was refilled and a second prescription was sent to the patient's pharmacy for next month.    -UDS           -? cervical MBB's of cervical spine        -C7-T1 ESI without beneift. Repeat injections with some benefit           C5-6 foraminal stenosis on MRI-ESI's without much benefit              4. Depression/sleep: Continue Cymbalta.   60mg  bid        -continue neuropsych for pain mgt techniques, coping skills==very helpful             -melatonin 3 to 6 mg at bedtime.   6. Lumbar Radiculopathy:  HEP 7 OA of right hand:    Voltaren gel and heat therapy.              . 8. Migraines: stable, largely driven by shoulder girdle and neck pain.             -CONTINUE topamax at 50mg  qhs     9. Left CTS:S/P EMG on 03/08/2017: S/P Carpal Tunnel Release on 06/14/2017 via . Dr. Apolonio Schneiders.  10. OIC: movantik           -continue diet/probiotic and diet   Fifteen minutes of face to face patient care time were spent during this visit. All questions were encouraged and answered.  Follow up with me in 2 mos .

## 2021-07-02 NOTE — Patient Instructions (Addendum)
PLEASE FEEL FREE TO CALL OUR OFFICE WITH ANY PROBLEMS OR QUESTIONS ([phone])   YOU NEED TO WORK ON SCAPULA  RANGE OF MOTION TO LOOSEN UP YOUR SHOULDER BLADE.Marland Kitchen  USE HEAT AND MASSAGE TOO!

## 2021-07-11 LAB — TOXASSURE SELECT,+ANTIDEPR,UR

## 2021-07-14 ENCOUNTER — Telehealth: Payer: Self-pay | Admitting: Pharmacist

## 2021-07-14 NOTE — Chronic Care Management (AMB) (Signed)
Chronic Care Management Pharmacy Assistant   Name: Robin Morgan  MRN: [id number] DOB: September 24, 1955   Reason for Encounter: Chart Review For Initial Visit With Clinical Pharmacist   Conditions to be addressed/monitored: HTN, Migraine, OSA, DM II, Hypothyroidism, HLD, Depression, Insomnia, Chronic Pain   Primary concerns for visit include: HTN, DM II, HLD  Recent office visits:  None  Recent consult visits:  07/02/2021 OV (Phys Med) Alger Simons T, MD; no medication changes indicated.  04/23/2021 OV (Phys Med) Alger Simons T, MD; INCREASE TOPAMAX TO 50MG  AT BEDTIME, IN 3 DAYS STOP AMBIEN, START MELATONIN 3-6MG  AT BEDTIME  04/22/2021 OV (ophthalmology) Marshall Cork D, no further information available.  02/12/2021 OV (Phys Med) Meredith Staggers, MD; no medication changes indicated.  Hospital visits:  None in previous 6 months  Medications: Outpatient Encounter Medications as of 07/14/2021  Medication Sig   atorvastatin (LIPITOR) 10 MG tablet 1 tablet   atorvastatin (LIPITOR) 40 MG tablet TAKE 1 TABLET BY MOUTH ONCE DAILY   b complex vitamins tablet Take 1 tablet by mouth daily.   Calcium Carbonate-Vitamin D (CALCIUM-VITAMIN D) 500-200 MG-UNIT per tablet Take 1 tablet by mouth 2 (two) times daily with a meal.   co-enzyme Q-10 30 MG capsule Take 30 mg by mouth at bedtime.    cyclobenzaprine (FLEXERIL) 10 MG tablet one tablet   diclofenac Sodium (VOLTAREN) 1 % GEL    DULoxetine (CYMBALTA) 30 MG capsule TAKE 3 CAPSULES BY MOUTH DAILY   FARXIGA 10 MG TABS tablet TAKE 1 TABLET BY MOUTH ONCE A DAY   fenofibrate 160 MG tablet TAKE 1 TABLET BY MOUTH ONCE A DAY   gabapentin (NEURONTIN) 300 MG capsule TAKE 2 CAPSULES BY MOUTH IN THE MORNING,2 CAPSULES AT LUNCH AND 2 CAPSULES AT BEDTIME   glipiZIDE (GLUCOTROL) 5 MG tablet 1 tablet   Insulin Glargine (BASAGLAR KWIKPEN) 100 UNIT/ML INJECT 35 UNITS INTO THE SKIN 2 TIMES DAILY   insulin glargine (LANTUS) 100 UNIT/ML  injection 60 units   levothyroxine (SYNTHROID) 50 MCG tablet 1 tablet   levothyroxine (SYNTHROID) 88 MCG tablet TAKE 1 TAB BY MOUTH ONCE DAILY. TAKE ON AN EMPTY STOMACH WITH A GLASS OF WATER ATLEAST 30-60 MINUTES BEFORE BREAKFAST   lisinopril (ZESTRIL) 10 MG tablet Take 0.5 tablets (5 mg total) by mouth daily.   lisinopril (ZESTRIL) 5 MG tablet Take 5 mg by mouth daily.   magnesium gluconate (MAGONATE) 500 MG tablet Take 1 tablet (500 mg total) by mouth daily.   meloxicam (MOBIC) 7.5 MG tablet 1 tablet   metFORMIN (GLUCOPHAGE) 1000 MG tablet TAKE 1 TABLET BY MOUTH TWICE A DAY WITH MEALS   morphine (MSIR) 15 MG tablet TAKE 1 TABLET BY MOUTH 3 TIMES DAILY AS NEEDED FOR MODERATE PAIN   MOVANTIK 25 MG TABS tablet TAKE 1 TABLET BY MOUTH ONCE DAILY   Omega-3 Fatty Acids (FISH OIL) 1000 MG CAPS 1 capsule   OPTIVE 0.5-0.9 % ophthalmic solution Place 1 drop into both eyes as needed.    pantoprazole (PROTONIX) 40 MG tablet Take 40 mg by mouth 2 (two) times daily.   pravastatin (PRAVACHOL) 20 MG tablet 1 tablet   RESTASIS 0.05 % ophthalmic emulsion Place 1 drop into both eyes daily.    sucralfate (CARAFATE) 1 GM/10ML suspension SMARTSIG:Milliliter(s) By Mouth   tiZANidine (ZANAFLEX) 2 MG tablet TAKE 1/2 TO 1 TABLET (1-2 MG TOTAL) BY MOUTH EVERY 6 HOURS AS NEEDED FOR MUSCLE SPASMS   topiramate (TOPAMAX) 50 MG tablet Take 1  tablet (50 mg total) by mouth at bedtime.   TRIAMCINOLONE & EMOLLIENT EX    Vitamin D, Ergocalciferol, 50000 units CAPS 1 capsule   zolpidem (AMBIEN) 10 MG tablet TAKE 1 TABLET BY MOUTH AT BEDTIME AS NEEDED FOR SLEEP   [DISCONTINUED] esomeprazole (NEXIUM) 40 MG capsule 1 capsule   No facility-administered encounter medications on file as of 07/14/2021.   Current Medications: Morphine 15 mg last filled 04/23/2021 27 DS Topiramate 50 mg last filled 06/25/2021 30 DS Volatern 1% gel prn  Levothyroxine 50 mcg - currently takes 88 mcg Meloxicam 7.5 mg  Pravastatin 20 mg  Lantus 100  unit/mL Omega-3 Fatty Acids 1000 mg Cyclobenzaprine 10 mg Triamcinolone & Emollient Ex Atorvastatin 10 mg  Vitamin D 50000 unit Glipizide 5 mg Fenofibrate 160 mg last filled 05/22/2021 90 DS Duloxetine 30 mg last filled 05/21/2021 90 DS Lisinopril 5 mg last filled 03/11/2021 90 DS Pantoprazole 40 mg Sucralfate 1 gm/10 mL Atorvastatin 40 mg last filled 03/24/2021 100 DS Tizanidine 2 mg last filled 06/02/2021 15 DS Levothyroxine 88 mcg last filled 03/11/2021 90 DS Gabapentin 300 mg last filled 05/09/2021 30 DS Zolpidem 10 mg last filled 11/18/2018 30 DS Basaglar Kiwkpen last filled 03/11/2021 86 DS Movantik 25 mg Farxiga 10 mg last filled 06/25/2021 30 DS Metformin 1000 mg last filled 06/03/2021 90 DS Lisinopril 10 mg  Optive 0.5%-0.9% ophthalmic solution Restasis 0.05% ophthalmic solution Magnesium 500 mg Co-enzyme Q-10 30 mg B Complex Calcium Carbonate-Vitamin D  -Patient is unsure of some of the medications listed. She requested to go over these at her face to face appointment with the clinical pharmacist on 07/22/2021.  Patient Questions: Any changes in your medications or health? Patient states she has a lot of physical pain and may be having surgery soon.  Any side effects from any medications?  She denies having any side effects from any of her medications.  Do you have any symptoms or problems not managed by your medications? "Well, I'm still going through a headache stage." She states she has migraines.   Any concerns about your health right now? Patient states she has concerns about physical pain and migraines. She's had 3 nerve blocks and is disappointed because none of them worked.  Has your provider asked that you check blood pressure, blood sugar, or follow special diet at home? She states she checks her blood sugars sometimes but mostly forgets to check it. She does not follow any special diet.  Do you get any type of exercise on a regular basis? She is  not able to exercise due to chronic pain.  Can you think of a goal you would like to reach for your health? She state she would like to be pain free.  Do you have any problems getting your medications? She denies having any problems getting her medications.  Is there anything that you would like to discuss during the appointment?  She states she does not have anything in particular that she would like to discuss other than her medications.  Please bring medications and supplements to appointment   Care Gaps: Medicare Annual Wellness: Completed Ophthalmology Exam: Next due on 04/22/2022 Foot Exam: Overdue since 05/27/2021 Hemoglobin A1C: 7.2% on 09/20/2020 Colonoscopy: Next due on 11/19/2025 Dexa Scan: Next due on 01/16/2023 Mammogram: Overdue since 06/19/2021  Future Appointments  Date Time Provider [place]  07/22/2021 11:00 AM [facility] CCM PHARMACIST [facility]  08/18/2021  2:00 PM Edgardo Roys, PsyD [facility]  10/08/2021  2:40 PM  Meredith Staggers, MD [facility]  12/12/2021  1:45 PM [facility] HEALTH COACH [facility]    Star Rating Drugs: Lisinopril 5 mg last filled 03/11/2021 90 DS Atorvastatin 40 mg last filled 03/24/2021 100 DS  April D Calhoun, [place] Pharmacist Assistant [phone]

## 2021-07-17 ENCOUNTER — Telehealth: Payer: Self-pay | Admitting: Physical Medicine & Rehabilitation

## 2021-07-17 ENCOUNTER — Other Ambulatory Visit: Payer: Self-pay | Admitting: Family Medicine

## 2021-07-17 ENCOUNTER — Telehealth: Payer: Self-pay | Admitting: *Deleted

## 2021-07-17 DIAGNOSIS — G43019 Migraine without aura, intractable, without status migrainosus: Secondary | ICD-10-CM

## 2021-07-17 NOTE — Telephone Encounter (Signed)
Patient states she was advised to take 2 topamax at 50mg  qhs    at bed and script states 1 pharmacy looking for clarification to fill.

## 2021-07-17 NOTE — Telephone Encounter (Signed)
Chris pharmacist from [facility] called about Tyiana's topiramate order. He says she says you told her to take two tablets at bedtime and that is what she has been doing. The rx reads 50 mg q hs but disp # 60. Your note says 50 mg q hs as well.  Can you clarify?

## 2021-07-17 NOTE — Telephone Encounter (Signed)
Urine drug screen for this encounter is consistent for prescribed medication 

## 2021-07-17 NOTE — Telephone Encounter (Signed)
She was advised when taking 25mg  (after I increased the dose) to take 2 tabs to make the 50mg  dose until I changed the rx to the 50mg  tab. She was instructed to take one 50mg  tab at bedtime.  Is she taking 100mg ?

## 2021-07-18 MED ORDER — TOPIRAMATE 50 MG PO TABS: 100.0000 mg | ORAL_TABLET | Freq: Every day | ORAL | 4 refills | Status: DC

## 2021-07-18 NOTE — Telephone Encounter (Signed)
She said she was taking two so new rx sent to pharmacy 2 tablets at hs of 50 mg tablets #60 [id number].

## 2021-07-21 NOTE — Progress Notes (Incomplete)
Chronic Care Management Pharmacy Note  07/21/2021 Name:  Robin Morgan MRN:  [id number] DOB:  [birth year]  Summary: ***  Recommendations/Changes made from today's visit: ***  Plan: ***   Subjective: Robin Morgan is an 64 y.o. year old female who is a primary patient of Leamon Arnt, MD.  The CCM team was consulted for assistance with disease management and care coordination needs.    Engaged with patient face to face for initial visit in response to provider referral for pharmacy case management and/or care coordination services.   Consent to Services:  The patient was given the following information about Chronic Care Management services today, agreed to services, and gave verbal consent: 1. CCM service includes personalized support from designated clinical staff supervised by the primary care provider, including individualized plan of care and coordination with other care providers 2. 24/7 contact phone numbers for assistance for urgent and routine care needs. 3. Service will only be billed when office clinical staff spend 20 minutes or more in a month to coordinate care. 4. Only one practitioner may furnish and bill the service in a calendar month. 5.The patient may stop CCM services at any time (effective at the end of the month) by phone call to the office staff. 6. The patient will be responsible for cost sharing (co-pay) of up to 20% of the service fee (after annual deductible is met). Patient agreed to services and consent obtained.  Patient Care Team: Leamon Arnt, MD as PCP - General (Family Medicine) Iran Planas, MD as Consulting Physician (Orthopedic Surgery) Meredith Staggers, MD as Consulting Physician (Physical Medicine and Rehabilitation) Jolene Provost, PA-C as Physician Assistant (Otolaryngology) Dohmeier, Asencion Partridge, MD as Consulting Physician (Neurology) Arta Silence, MD as Consulting Physician (Gastroenterology) Hortencia Pilar, MD as  Consulting Physician (Ophthalmology) Edythe Clarity, [facility] as Pharmacist (Pharmacist)  Recent office visits:  None   Recent consult visits:  07/02/2021 OV (Phys Med) Meredith Staggers, MD; no medication changes indicated.   04/23/2021 OV (Phys Med) Alger Simons T, MD; INCREASE TOPAMAX TO $RemoveBe'50MG'FlntdiRpm$  AT BEDTIME, IN 3 DAYS STOP AMBIEN, START MELATONIN 3-$RemoveBeforeDEI'6MG'obHQVnuyFxzoCdxH$  AT BEDTIME   04/22/2021 OV (ophthalmology) Marshall Cork D, no further information available.   02/12/2021 OV (Phys Med) Meredith Staggers, MD; no medication changes indicated.   Hospital visits:  None in previous 6 months   Objective:  Lab Results  Component Value Date   CREATININE 0.86 05/27/2020   BUN 13 05/27/2020   GFR 58.90 (L) 06/05/2019   GFRNONAA 70 05/27/2020   GFRAA 82 05/27/2020   NA 142 05/27/2020   K 4.3 05/27/2020   CALCIUM 9.5 05/27/2020   CO2 26 05/27/2020   GLUCOSE 64 (L) 05/27/2020    Lab Results  Component Value Date/Time   HGBA1C 7.2 (H) 09/20/2020 12:10 PM   HGBA1C 6.6 (H) 05/27/2020 01:44 PM   HGBA1C 6.2 (A) 05/27/2020 01:17 PM   HGBA1C 7.3 12/01/2019 11:10 AM   HGBA1C 7.3 (A) 12/01/2019 11:10 AM   HGBA1C 7.3 (A) 12/01/2019 11:10 AM   GFR 58.90 (L) 06/05/2019 09:35 AM   GFR 70.97 09/02/2018 08:49 AM   MICROALBUR <0.7 06/05/2019 09:35 AM   MICROALBUR 1.8 06/16/2018 10:35 AM    Last diabetic Eye exam:  Lab Results  Component Value Date/Time   HMDIABEYEEXA No Retinopathy 04/22/2021 12:00 AM    Last diabetic Foot exam: No results found for: HMDIABFOOTEX   Lab Results  Component Value Date   CHOL 116 05/27/2020  HDL 39 (L) 05/27/2020   LDLCALC 56 05/27/2020   TRIG 124 05/27/2020   CHOLHDL 3.0 05/27/2020    Hepatic Function Latest Ref Rng & Units 05/27/2020 06/05/2019 09/02/2018  Total Protein 6.1 - 8.1 g/dL 6.6 6.7 6.6  Albumin 3.5 - 5.2 g/dL - 4.4 4.3  AST 10 - 35 U/L 27 23 29   ALT 6 - 29 U/L 18 19 23   Alk Phosphatase 39 - 117 U/L - 51 62  Total Bilirubin 0.2 - 1.2 mg/dL  0.4 0.6 0.4    Lab Results  Component Value Date/Time   TSH 0.61 05/27/2020 12:00 PM   TSH 2.40 06/05/2019 09:35 AM    CBC Latest Ref Rng & Units 05/27/2020 06/05/2019 09/02/2018  WBC 3.8 - 10.8 Thousand/uL 9.5 10.0 8.2  Hemoglobin 11.7 - 15.5 g/dL 12.5 11.8(L) 12.2  Hematocrit 35.0 - 45.0 % 40.4 36.9 39.0  Platelets 140 - 400 Thousand/uL 401(H) 385.0 398.0    No results found for: VD25OH  Clinical ASCVD: {YES/NO:21197} The ASCVD Risk score (Arnett DK, et al., 2019) failed to calculate for the following reasons:   The valid total cholesterol range is 130 to 320 mg/dL    Depression screen [facility] 2/9 07/02/2021 04/23/2021 12/06/2020  Decreased Interest 1 1 0  Down, Depressed, Hopeless 1 1 1   PHQ - 2 Score 2 2 1   Altered sleeping - - -  Tired, decreased energy - - -  Change in appetite - - -  Feeling bad or failure about yourself  - - -  Trouble concentrating - - -  Moving slowly or fidgety/restless - - -  Suicidal thoughts - - -  PHQ-9 Score - - -  Difficult doing work/chores - - -  Some recent data might be hidden     ***Other: (CHADS2VASc if Afib, MMRC or CAT for COPD, ACT, DEXA)  Social History   Tobacco Use  Smoking Status Never  Smokeless Tobacco Never   BP Readings from Last 3 Encounters:  07/02/21 105/65  04/23/21 119/66  02/12/21 (!) 93/58   Pulse Readings from Last 3 Encounters:  07/02/21 92  04/23/21 (!) 103  02/12/21 90   Wt Readings from Last 3 Encounters:  07/02/21 131 lb (59.4 kg)  04/23/21 129 lb (58.5 kg)  02/12/21 132 lb (59.9 kg)   BMI Readings from Last 3 Encounters:  07/02/21 24.75 kg/m  04/23/21 24.37 kg/m  02/12/21 24.94 kg/m    Assessment/Interventions: Review of patient past medical history, allergies, medications, health status, including review of consultants reports, laboratory and other test data, was performed as part of comprehensive evaluation and provision of chronic care management services.   SDOH:  (Social Determinants  of Health) assessments and interventions performed: {yes/no:20286}  SDOH Screenings   Alcohol Screen: Not on file  Depression (PHQ2-9): Low Risk    PHQ-2 Score: 2  Financial Resource Strain: Low Risk    Difficulty of Paying Living Expenses: Not hard at all  Food Insecurity: No Food Insecurity   Worried About Charity fundraiser in the Last Year: Never true   Ran Out of Food in the Last Year: Never true  Housing: Low Risk    Last Housing Risk Score: 0  Physical Activity: Inactive   Days of Exercise per Week: 0 days   Minutes of Exercise per Session: 0 min  Social Connections: Socially Isolated   Frequency of Communication with Friends and Family: Twice a week   Frequency of Social Gatherings with Friends and Family: Once  a week   Attends Religious Services: Never   Active Member of Clubs or Organizations: No   Attends Archivist Meetings: Never   Marital Status: Divorced  Stress: No Stress Concern Present   Feeling of Stress : Only a little  Tobacco Use: Low Risk    Smoking Tobacco Use: Never   Smokeless Tobacco Use: Never   Passive Exposure: Not on file  Transportation Needs: No Transportation Needs   Lack of Transportation (Medical): No   Lack of Transportation (Non-Medical): No    CCM Care Plan  Allergies  Allergen Reactions   Compazine Other (See Comments)    Seizure-like reaction   Emetrol     Other reaction(s): GI upset   Prochlorperazine     Other reaction(s): eye rolled back   Prochlorperazine Edisylate    Prochlorperazine Maleate    Naproxen Itching    Other reaction(s): itiching    Medications Reviewed Today     Reviewed by Meredith Staggers, MD (Physician) on 07/02/21 at 1433  Med List Status: <None>   Medication Order Taking? Sig Documenting Provider Last Dose Status Informant  atorvastatin (LIPITOR) 10 MG tablet [id number] Yes 1 tablet [provider] Taking Active   atorvastatin (LIPITOR) 40 MG tablet [id number] Yes TAKE 1 TABLET  BY MOUTH ONCE DAILY Leamon Arnt, MD Taking Active   b complex vitamins tablet [id number] Yes Take 1 tablet by mouth daily. [provider] Taking Active   Calcium Carbonate-Vitamin D (CALCIUM-VITAMIN D) 500-200 MG-UNIT per tablet [id number] Yes Take 1 tablet by mouth 2 (two) times daily with a meal. [provider] Taking Active   co-enzyme Q-10 30 MG capsule [id number] Yes Take 30 mg by mouth at bedtime.  [provider] Taking Active   cyclobenzaprine (FLEXERIL) 10 MG tablet [id number] Yes one tablet [provider] Taking Active   diclofenac Sodium (VOLTAREN) 1 % GEL [id number] Yes  [provider] Taking Active   DULoxetine (CYMBALTA) 30 MG capsule [id number] Yes TAKE 3 CAPSULES BY MOUTH DAILY Leamon Arnt, MD Taking Active     Discontinued 63/87/56 4332 (Duplicate)   FARXIGA 10 MG TABS tablet [id number] Yes TAKE 1 TABLET BY MOUTH ONCE A DAY Leamon Arnt, MD Taking Active   fenofibrate 160 MG tablet [id number] Yes TAKE 1 TABLET BY MOUTH ONCE A DAY Leamon Arnt, MD Taking Active   gabapentin (NEURONTIN) 300 MG capsule [id number] Yes TAKE 2 CAPSULES BY MOUTH IN THE MORNING,2 CAPSULES AT LUNCH AND 2 CAPSULES AT BEDTIME Bayard Hugger, NP Taking Active   glipiZIDE (GLUCOTROL) 5 MG tablet [id number] Yes 1 tablet [provider] Taking Active   Insulin Glargine [facility] KWIKPEN) 100 UNIT/ML [id number] Yes INJECT 35 UNITS INTO THE SKIN 2 TIMES DAILY Leamon Arnt, MD Taking Active   insulin glargine (LANTUS) 100 UNIT/ML injection [id number] Yes 60 units [provider] Taking Active   levothyroxine (SYNTHROID) 50 MCG tablet [id number] Yes 1 tablet [provider] Taking Active   levothyroxine (SYNTHROID) 88 MCG tablet [id number] Yes TAKE 1 TAB BY MOUTH ONCE DAILY. TAKE ON AN EMPTY STOMACH WITH A GLASS OF WATER ATLEAST 30-60 MINUTES BEFORE BREAKFAST Leamon Arnt, MD Taking Active   lisinopril (ZESTRIL) 10 MG tablet [id number] Yes  Take 0.5 tablets (5 mg total) by mouth daily. Leamon Arnt, MD Taking Active   lisinopril (ZESTRIL) 5 MG tablet [id number] Yes Take 5 mg by mouth daily. [provider] Taking Active   magnesium gluconate (MAGONATE)  500 MG tablet [id number] Yes Take 1 tablet (500 mg total) by mouth daily. Leamon Arnt, MD Taking Active   meloxicam [facility]) 7.5 MG tablet [id number] Yes 1 tablet [provider] Taking Active   metFORMIN (GLUCOPHAGE) 1000 MG tablet [id number] Yes TAKE 1 TABLET BY MOUTH TWICE A DAY WITH MEALS Leamon Arnt, MD Taking Active   morphine (MSIR) 15 MG tablet [id number]  TAKE 1 TABLET BY MOUTH 3 TIMES DAILY AS NEEDED FOR MODERATE PAIN Meredith Staggers, MD  Active   MOVANTIK 25 MG TABS tablet [id number] Yes TAKE 1 TABLET BY MOUTH ONCE DAILY Meredith Staggers, MD Taking Active   Omega-3 Fatty Acids (FISH OIL) 1000 MG CAPS [id number] Yes 1 capsule [provider] Taking Active   OPTIVE 0.5-0.9 % ophthalmic solution [id number] Yes Place 1 drop into both eyes as needed.  [provider] Taking Active   pantoprazole (PROTONIX) 40 MG tablet [id number] Yes Take 40 mg by mouth 2 (two) times daily. [provider] Taking Active   pravastatin (PRAVACHOL) 20 MG tablet [id number] Yes 1 tablet [provider] Taking Active   RESTASIS 0.05 % ophthalmic emulsion [id number] Yes Place 1 drop into both eyes daily.  [provider] Taking Active   sucralfate (CARAFATE) 1 GM/10ML suspension [id number] Yes SMARTSIG:Milliliter(s) By Mouth [provider] Taking Active   tiZANidine (ZANAFLEX) 2 MG tablet [id number] Yes TAKE 1/2 TO 1 TABLET (1-2 MG TOTAL) BY MOUTH EVERY 6 HOURS AS NEEDED FOR MUSCLE SPASMS Meredith Staggers, MD Taking Active   topiramate (TOPAMAX) 50 MG tablet [id number]  Take 1 tablet (50 mg total) by mouth at bedtime. Meredith Staggers, MD  Active   TRIAMCINOLONE & EMOLLIENT EX [id number] Yes  [provider] Taking Active    Vitamin D, Ergocalciferol, 50000 units CAPS [id number] Yes 1 capsule [provider] Taking Active   zolpidem (AMBIEN) 10 MG tablet [id number] Yes TAKE 1 TABLET BY MOUTH AT BEDTIME AS NEEDED FOR SLEEP Leamon Arnt, MD Taking Active             Patient Active Problem List   Diagnosis Date Noted   Rotator cuff syndrome, left 12/11/2020   Migraine without aura, intractable, without status migrainosus 07/31/2020   Cervical disc disorder with radiculopathy of cervical region 07/31/2020   Biceps tendinitis, right 02/07/2020   Mid back pain 12/06/2019   Insomnia due to medical condition 12/01/2019   Chronic prescription opiate use 09/02/2018   Esophageal stricture    Tenosynovitis, wrist 10/07/2017   Trigger thumb of left hand 10/07/2017   Laryngopharyngeal reflux (LPR) 08/26/2017   Dysphagia 08/26/2017   Left carpal tunnel syndrome 03/08/2017   Diabetic peripheral neuropathy associated with type 2 diabetes mellitus ([place]) 01/06/2017   OSA (obstructive sleep apnea) 03/10/2016   Fibromyalgia 02/12/2016   Controlled diabetes mellitus with diabetic neuropathy, with long-term current use of insulin ([place]) 10/11/2014   HTN (hypertension) 10/11/2014   Hyperlipidemia 10/11/2014   DDD (degenerative disc disease), lumbar 01/15/2014   Hypothyroidism, acquired 06/12/2013   Cervical post-laminectomy syndrome 05/10/2013   Osteoporosis 12/01/2012   Chronic diarrhea 07/04/2012   Myofascial muscle pain 10/28/2011   Depression, recurrent ([place]) 10/28/2011   Chronic pain associated with significant psychosocial dysfunction 01/03/2010    Immunization History  Administered Date(s) Administered   Fluad Quad(high Dose 65+) 04/06/2019   Influenza Split 06/04/2009, 05/02/2012   Influenza, High Dose Seasonal PF 05/13/2020   Influenza, Quadrivalent, Recombinant, Inj, Pf 05/12/2016, 07/07/2017   Influenza, Seasonal,  Injecte, Preservative Fre 06/06/2014, 04/24/2015   Influenza,inj,Quad PF,6+ Mos  05/12/2016, 07/07/2017, 05/05/2018   Influenza-Unspecified 09/03/2011, 04/19/2013   PFIZER(Purple Top)SARS-COV-2 Vaccination 10/03/2019, 10/24/2019, 06/28/2020   Pneumococcal Conjugate-13 12/01/2019   Pneumococcal Polysaccharide-23 08/29/2009, 10/19/2018   Tdap 05/11/2012    Conditions to be addressed/monitored:  HTN, Migraine, Hypothyroidism, Type II DM, Hypothryoidism, Osteoporosis, Depression, HLD, Insomnia  There are no care plans that you recently modified to display for this patient.    Medication Assistance: {MEDASSISTANCEINFO:25044}  Compliance/Adherence/Medication fill history: Care Gaps: ***  Star-Rating Drugs: ***  Patient's preferred pharmacy is:  [facility] [place] [facility] [place] [address] Phone: [phone] Fax: [place], [place] - [place] [address] [place] [address] Phone: [phone] Fax: [phone]  Uses pill box? {Yes or If no, why not?:20788} Pt endorses ***% compliance  We discussed: {Pharmacy options:24294} Patient decided to: {US Pharmacy Plan:23885}  Care Plan and Follow Up Patient Decision:  {FOLLOWUP:24991}  Plan: {CM FOLLOW UP BSJG:28366}  ***  Current Barriers:  {pharmacybarriers:24917}  Pharmacist Clinical Goal(s):  Patient will {PHARMACYGOALCHOICES:24921} through collaboration with PharmD and provider.   Interventions: 1:1 collaboration with Leamon Arnt, MD regarding development and update of comprehensive plan of care as evidenced by provider attestation and co-signature Inter-disciplinary care team collaboration (see longitudinal plan of care) Comprehensive medication review performed; medication list updated in electronic medical record  {CCM PHARMD DISEASE STATES:25130}  Patient Goals/Self-Care Activities Patient will:  - {pharmacypatientgoals:24919}  Follow Up Plan: {CM FOLLOW UP QHUT:65465}

## 2021-07-22 ENCOUNTER — Ambulatory Visit: Payer: Medicare HMO

## 2021-07-29 ENCOUNTER — Other Ambulatory Visit: Payer: Self-pay | Admitting: Family Medicine

## 2021-08-18 ENCOUNTER — Encounter: Payer: Medicare HMO | Attending: Physical Medicine and Rehabilitation | Admitting: Psychology

## 2021-08-18 ENCOUNTER — Other Ambulatory Visit: Payer: Self-pay

## 2021-08-18 DIAGNOSIS — G894 Chronic pain syndrome: Secondary | ICD-10-CM

## 2021-08-18 DIAGNOSIS — F339 Major depressive disorder, recurrent, unspecified: Secondary | ICD-10-CM | POA: Diagnosis not present

## 2021-08-18 DIAGNOSIS — G43019 Migraine without aura, intractable, without status migrainosus: Secondary | ICD-10-CM

## 2021-08-18 DIAGNOSIS — M5010 Cervical disc disorder with radiculopathy, unspecified cervical region: Secondary | ICD-10-CM | POA: Diagnosis not present

## 2021-08-19 ENCOUNTER — Encounter: Payer: Self-pay | Admitting: Psychology

## 2021-08-19 NOTE — Progress Notes (Signed)
Neuropsychology Visit  Patient:  Robin Morgan   DOB: 1955-11-24  MR Number: [id number]  Location: [facility] [place], [place] [id number] [place] [id number] Dept: [phone]  Date of Service: 08/18/2021  Start: 2 PM End: 3 PM  Duration of Service: 1 Hour  Today's visit was conducted in my outpatient clinic office with the patient myself present.  Today's visit was an in person visit.  Provider/Observer:     Edgardo Roys PsyD  Chief Complaint:      Chief Complaint  Patient presents with   Back Pain   Pain   Anxiety   Depression    Reason For Service:     Robin Morgan. Toppin is a 66 year old right-handed female referred by Dr. Naaman Plummer for neuropsychological/psychological consultation due to significant depression and anxiety with primary stressors related to family issues in the setting of fibromyalgia and chronic severe pain symptoms.  The patient reports that she has had significant pain in her back primarily mid back for 20 years or more.  The patient is also been diagnosed with fibromyalgia.  The patient had surgery on her neck to try to alleviate help with her severe recurrent headaches but experience little change post surgery.  The patient also has significant shoulder pain and severe headache with these pain and headaches resulting in her having to spend multiple days at a time essentially in bed.  The patient is continued to struggle with anxiety and depressive symptoms on top of her chronic pain disorder.  The patient was asked to help take care of her father who has dementia and her sisters expected her to stay there for an extended period of time.  The patient was there for almost a week experiencing increasing problems due to poor sleeping conditions and an exacerbation of her underlying chronic pain symptoms.  There continues to be stressors and animosity  between the patient and her siblings.  There continues to be a lot of stress around what is happening with the care of her father and the relationship dynamics between the patient and her 2 sisters.  The patient herself has significant pain and is unable to physically help much with her father but particularly her younger sister is demanding and manipulative of the patient to try to be able to do more.  The patient's description of the younger sister present a picture of a very manipulative narcissistic like behavioral pattern in the younger sister and it is very difficult for her and the 2 other sisters to engage and have the younger sister react appropriately.  Treatment Interventions:  Therapeutic interventions for issues related to chronic pain/fibromyalgia and recurrent headaches and depression anxiety symptoms with significant psychosocial stressors.  Participation Level:   Active  Participation Quality:  Appropriate and Attentive      Behavioral Observation:  Well Groomed, Alert, and Appropriate.   Current Psychosocial Factors: Patient reports she is still having a lot of struggles with one of her sisters and the sisters attempt to control aspects of their father's life.  The patient reports that she and her youngest sister are having to try to cope and manage with this narcissistic type of manipulation.  The patient has been working on some the strategies we have developed as the stressors around this tend to exacerbate her pain symptoms.  Content of Session:   Reviewed current symptoms and continue to develop coping skills around her chronic  pain and fibromyalgia symptoms as well as headaches and dealing with cervical postlaminectomy syndrome.  We also worked on significant psychosocial stressors that are being impacted by the patient's limited physical functioning as well as her stressors having a significant deleterious on her overall pain symptoms.  Effectiveness of Interventions: The  patient has been quite open and active in these therapeutic processes and rapport been easily established.  The patient's cognitive function is good and while stress affects her ability to stay focused this is not indicative of any type of cognitive deficits with the patient.  The patient is already working on some of the initial issues we have focused around sleep hygiene, physical activity and other coping issues around her chronic pain symptoms.  Target Goals:   Goals include building better coping and adaptive skills around her chronic pain symptoms and working on better pain management as well as working on significant psychosocial stressors that are negatively impacting her current medical issues.  Goals Last Reviewed:   08/18/2021  Goals Addressed Today:    Today we worked a lot on the various stressors in her life that are exacerbating her chronic pain symptoms.  Impression/Diagnosis:   Robin Morgan is a 66 year old right-handed female referred by Dr. Naaman Plummer for neuropsychological/psychological consultation due to significant depression and anxiety with primary stressors related to family issues in the setting of fibromyalgia and chronic severe pain symptoms.  The patient reports that she has had significant pain in her back primarily mid back for 20 years or more.  The patient is also been diagnosed with fibromyalgia.  The patient had surgery on her neck to try to alleviate help with her severe recurrent headaches but experience little change post surgery.  The patient also has significant shoulder pain and severe headache with these pain and headaches resulting in her having to spend multiple days at a time essentially in bed.   I do think that the patient's anxiety, stress and depressive symptoms do play and exacerbating role in her overall pain symptoms and while she clearly has abnormalities in lumbar and cervical regions as a primary factor for her pain symptoms her stress responses,  fibromyalgia and depression do play a role in the acute day-to-day level of her pain.  Today we have continue to work on therapeutic interventions around chronic pain and building better coping strategies for the numerous stressors that she is facing particularly around issues with her family and the impact that stress has on her chronic pain difficulties.  Diagnosis:   Cervical disc disorder with radiculopathy of cervical region  Chronic pain syndrome  Migraine without aura, intractable, without status migrainosus  Depression, recurrent (HCC)    Ilean Skill, Psy.D. Clinical Psychologist Neuropsychologist

## 2021-08-22 ENCOUNTER — Other Ambulatory Visit: Payer: Self-pay | Admitting: Family Medicine

## 2021-08-28 ENCOUNTER — Other Ambulatory Visit: Payer: Self-pay | Admitting: Family Medicine

## 2021-09-01 ENCOUNTER — Ambulatory Visit (INDEPENDENT_AMBULATORY_CARE_PROVIDER_SITE_OTHER): Payer: Medicare HMO | Admitting: Family Medicine

## 2021-09-01 ENCOUNTER — Other Ambulatory Visit: Payer: Self-pay

## 2021-09-01 ENCOUNTER — Encounter: Payer: Self-pay | Admitting: Family Medicine

## 2021-09-01 VITALS — BP 112/74 | HR 92 | Temp 97.9°F | Ht 61.0 in | Wt 132.2 lb

## 2021-09-01 DIAGNOSIS — E1142 Type 2 diabetes mellitus with diabetic polyneuropathy: Secondary | ICD-10-CM

## 2021-09-01 DIAGNOSIS — E1159 Type 2 diabetes mellitus with other circulatory complications: Secondary | ICD-10-CM | POA: Diagnosis not present

## 2021-09-01 DIAGNOSIS — I152 Hypertension secondary to endocrine disorders: Secondary | ICD-10-CM

## 2021-09-01 DIAGNOSIS — M797 Fibromyalgia: Secondary | ICD-10-CM | POA: Diagnosis not present

## 2021-09-01 DIAGNOSIS — G894 Chronic pain syndrome: Secondary | ICD-10-CM

## 2021-09-01 DIAGNOSIS — E039 Hypothyroidism, unspecified: Secondary | ICD-10-CM

## 2021-09-01 DIAGNOSIS — M961 Postlaminectomy syndrome, not elsewhere classified: Secondary | ICD-10-CM

## 2021-09-01 DIAGNOSIS — E1169 Type 2 diabetes mellitus with other specified complication: Secondary | ICD-10-CM | POA: Diagnosis not present

## 2021-09-01 DIAGNOSIS — F339 Major depressive disorder, recurrent, unspecified: Secondary | ICD-10-CM

## 2021-09-01 DIAGNOSIS — R69 Illness, unspecified: Secondary | ICD-10-CM | POA: Diagnosis not present

## 2021-09-01 DIAGNOSIS — Z794 Long term (current) use of insulin: Secondary | ICD-10-CM

## 2021-09-01 DIAGNOSIS — Z23 Encounter for immunization: Secondary | ICD-10-CM | POA: Diagnosis not present

## 2021-09-01 DIAGNOSIS — E1140 Type 2 diabetes mellitus with diabetic neuropathy, unspecified: Secondary | ICD-10-CM | POA: Diagnosis not present

## 2021-09-01 DIAGNOSIS — M810 Age-related osteoporosis without current pathological fracture: Secondary | ICD-10-CM | POA: Diagnosis not present

## 2021-09-01 DIAGNOSIS — E559 Vitamin D deficiency, unspecified: Secondary | ICD-10-CM

## 2021-09-01 DIAGNOSIS — E782 Mixed hyperlipidemia: Secondary | ICD-10-CM

## 2021-09-01 DIAGNOSIS — Z0000 Encounter for general adult medical examination without abnormal findings: Secondary | ICD-10-CM

## 2021-09-01 MED ORDER — DAPAGLIFLOZIN PROPANEDIOL 10 MG PO TABS: 10.0000 mg | ORAL_TABLET | Freq: Every day | ORAL | 3 refills | Status: DC

## 2021-09-01 NOTE — Progress Notes (Signed)
Subjective  Chief Complaint  Patient presents with   Annual Exam    Fasting    HPI: Robin Morgan is a 66 y.o. female who presents to [facility] today for a Female Wellness Visit. She also has the concerns and/or needs as listed above in the chief complaint. These will be addressed in addition to the Health Maintenance Visit.   Wellness Visit: annual visit with health maintenance review and exam without Pap  HM: pt hasn't been in for over a year. Due mammogram. Eligible for shingrix and covid but declines at this time.  Chronic disease f/u and/or acute problem visit: (deemed necessary to be done in addition to the wellness visit): Multiple chronic medical problems need to be rechecked including diabetes, htn, hld, hypothyroidism, gerd, vit d deficiency, fibromyalgia and depression. Pt reports her chronic pain is her largest issue and is the reason she hasn't been in,. She is on high dose cymbalta for fibro and depression. Reports these problems are stable.  [place] list was reviewed in detailed and updated> multiple duplicates existed.  DM on basaglar 35 bid, farxiga 10 and metformin 500 bid. Denies low blood sugars. Hasn't checked sugars at home. Due eye exam. Denies foot sores HTN and HLD on meds w/o chest pain sob or leg swelling.  Low thyroid w/ stable energy levels. Not sure which dose of levothyroxine she is taking,.   Assessment  1. Annual physical exam   2. Chronic pain associated with significant psychosocial dysfunction   3. Controlled type 2 diabetes mellitus with diabetic neuropathy, with long-term current use of insulin ([place])   4. Diabetic peripheral neuropathy associated with type 2 diabetes mellitus ([place])   5. Hypertension associated with diabetes ([place])   6. Hypothyroidism, acquired   7. Depression, recurrent ([place])   8. Cervical post-laminectomy syndrome   9. Combined hyperlipidemia associated with type 2 diabetes mellitus ([place])   10.  Fibromyalgia   11. Age-related osteoporosis without current pathological fracture   12. Vitamin D deficiency   13. Immunization due      Plan  Female Wellness Visit: Age appropriate Health Maintenance and Prevention measures were discussed with patient. Included topics are cancer screening recommendations, ways to keep healthy (see AVS) including dietary and exercise recommendations, regular eye and dental care, use of seat belts, and avoidance of moderate alcohol use and tobacco use.  BMI: discussed patient's BMI and encouraged positive lifestyle modifications to help get to or maintain a target BMI. HM needs and immunizations were addressed and ordered. See below for orders. See HM and immunization section for updates. Routine labs and screening tests ordered including cmp, cbc and lipids where appropriate. Discussed recommendations regarding Vit D and calcium supplementation (see AVS)  Chronic disease management visit and/or acute problem visit: Recheck labs for diabetes, htn and hld and thyroid. Then can adjust meds if needed. Bp looks good today on ace.  Mood and pain are stable; active but stable.  Recheck vitamin d Flu shot today.  Rec closer f/u.   Follow up: 3 months for recheck   Orders Placed This Encounter  Procedures   Flu Vaccine QUAD High Dose(Fluad)   CBC with Differential/Platelet   Comprehensive metabolic panel   Lipid panel   B12 and Folate Panel   Hemoglobin A1c   TSH   VITAMIN D 25 Hydroxy (Vit-D Deficiency, Fractures)   Meds ordered this encounter  Medications   dapagliflozin propanediol (FARXIGA) 10 MG TABS tablet  Sig: Take 1 tablet (10 mg total) by mouth daily.    Dispense:  90 tablet    Refill:  3      Body mass index is 24.98 kg/m. Wt Readings from Last 3 Encounters:  09/01/21 132 lb 3.2 oz (60 kg)  07/02/21 131 lb (59.4 kg)  04/23/21 129 lb (58.5 kg)     Patient Active Problem List   Diagnosis Date Noted   Diabetic peripheral  neuropathy associated with type 2 diabetes mellitus ([place]) 01/06/2017    Priority: High   OSA (obstructive sleep apnea) 03/10/2016    Priority: High   Controlled diabetes mellitus with diabetic neuropathy, with long-term current use of insulin ([place]) 10/11/2014    Priority: High   Hypertension associated with diabetes ([place]) 10/11/2014    Priority: High   Combined hyperlipidemia associated with type 2 diabetes mellitus ([place]) 10/11/2014    Priority: High   DDD (degenerative disc disease), lumbar 01/15/2014    Priority: High   Hypothyroidism, acquired 06/12/2013    Priority: High   Cervical post-laminectomy syndrome 05/10/2013    Priority: High   Chronic pain associated with significant psychosocial dysfunction 01/03/2010    Priority: High    Chronic Pain Syndrome, Dr. Earl Lites     Esophageal stricture     Priority: Medium    Tenosynovitis, wrist 10/07/2017    Priority: Medium    Trigger thumb of left hand 10/07/2017    Priority: Medium    Laryngopharyngeal reflux (LPR) 08/26/2017    Priority: Medium    Dysphagia 08/26/2017    Priority: Medium    Left carpal tunnel syndrome 03/08/2017    Priority: Medium    Fibromyalgia 02/12/2016    Priority: Medium    Osteoporosis 12/01/2012    Priority: Medium     01/2021: osteoporosis T= -2.7 wrist. Spine excluded. Offer tx.  10/2017: osteopenia: T = -1.9; stable. Recheck 2 years 4/ 2014 T = -1.2 at hip, -1.8 at spine    Chronic diarrhea 07/04/2012    Priority: Medium     Overview:  ? Related to gallbladder surgery. Has been evaluated by Dr. Amedeo Plenty [facility]. Lomotil as needed.    Rotator cuff syndrome, left 12/11/2020   Migraine without aura, intractable, without status migrainosus 07/31/2020   Cervical disc disorder with radiculopathy of cervical region 07/31/2020   Biceps tendinitis, right 02/07/2020   Mid back pain 12/06/2019   Insomnia due to medical condition 12/01/2019   Chronic prescription opiate use 09/02/2018   Myofascial  muscle pain 10/28/2011   Depression, recurrent ([place]) 10/28/2011   Health Maintenance  Topic Date Due   HEMOGLOBIN A1C  03/20/2021   FOOT EXAM  05/27/2021   MAMMOGRAM  06/19/2021   COVID-19 Vaccine (4 - Booster for [place] series) 09/17/2021 (Originally 08/23/2020)   Zoster Vaccines- Shingrix (1 of 2) 11/30/2021 (Originally 09/07/1974)   OPHTHALMOLOGY EXAM  04/22/2022   TETANUS/TDAP  05/11/2022   DEXA SCAN  01/16/2023   COLONOSCOPY (Pts 45-32yrs Insurance coverage will need to be confirmed)  11/19/2025   Pneumonia Vaccine 80+ Years old  Completed   INFLUENZA VACCINE  Completed   Hepatitis C Screening  Completed   HPV VACCINES  Aged Out   Immunization History  Administered Date(s) Administered   Fluad Quad(high Dose 65+) 04/06/2019, 09/01/2021   Influenza Split 06/04/2009, 05/02/2012   Influenza, High Dose Seasonal PF 05/13/2020   Influenza, Quadrivalent, Recombinant, Inj, Pf 05/12/2016, 07/07/2017   Influenza, Seasonal, Injecte, Preservative Fre 06/06/2014, 04/24/2015   Influenza,inj,Quad PF,6+ Mos  05/12/2016, 07/07/2017, 05/05/2018   Influenza-Unspecified 09/03/2011, 04/19/2013   PFIZER(Purple Top)SARS-COV-2 Vaccination 10/03/2019, 10/24/2019, 06/28/2020   Pneumococcal Conjugate-13 12/01/2019   Pneumococcal Polysaccharide-23 08/29/2009, 10/19/2018   Tdap 05/11/2012   We updated and reviewed the patient's past history in detail and it is documented below. Allergies: Patient is allergic to compazine, emetrol, prochlorperazine, prochlorperazine edisylate, prochlorperazine maleate, and naproxen. Past Medical History Patient  has a past medical history of Calcific tendonitis, Cervical spondylosis without myelopathy, Depression, Diabetes mellitus, Esophageal stricture, Fibromyalgia, GERD (gastroesophageal reflux disease), Hiatal hernia, Hyperlipidemia, and Hypertension. Past Surgical History Patient  has a past surgical history that includes Spine surgery; Cholecystectomy;  Tonsillectomy; Rotator cuff repair; Breast excisional biopsy (Right, 1990's); Gallbladder surgery; Breast surgery; Appendectomy; and Abdominal hysterectomy. Family History: Patient family history includes Alzheimer's disease in her mother; Breast cancer (age of onset: 37) in her paternal grandmother; Diabetes in her father; Heart disease in her maternal grandmother. Social History:  Patient  reports that she has never smoked. She has never used smokeless tobacco. She reports that she does not drink alcohol and does not use drugs.  Review of Systems: Constitutional: negative for fever or malaise Ophthalmic: negative for photophobia, double vision or loss of vision Cardiovascular: negative for chest pain, dyspnea on exertion, or new LE swelling Respiratory: negative for SOB or persistent cough Gastrointestinal: negative for abdominal pain, change in bowel habits or melena Genitourinary: negative for dysuria or gross hematuria, no abnormal uterine bleeding or disharge Musculoskeletal: negative for new gait disturbance or muscular weakness Integumentary: negative for new or persistent rashes, no breast lumps Neurological: negative for TIA or stroke symptoms Psychiatric: negative for SI or delusions Allergic/Immunologic: negative for hives  Patient Care Team    Relationship Specialty Notifications Start End  Leamon Arnt, MD PCP - General Family Medicine  11/09/17   Iran Planas, MD Consulting Physician Orthopedic Surgery  11/09/17   Meredith Staggers, MD Consulting Physician Physical Medicine and Rehabilitation  11/09/17   Jolene Provost, PA-C Physician Assistant Otolaryngology  11/09/17   Dohmeier, Asencion Partridge, MD Consulting Physician Neurology  11/09/17   Arta Silence, MD Consulting Physician Gastroenterology  11/23/18   Hortencia Pilar, MD Consulting Physician Ophthalmology  12/01/19   Edythe Clarity, [facility] Pharmacist Pharmacist  05/26/21    Comment: [phone]    Objective   Vitals: BP 112/74    Pulse 92    Temp 97.9 F (36.6 C) (Temporal)    Ht 5\' 1"  (1.549 m)    Wt 132 lb 3.2 oz (60 kg)    SpO2 98%    BMI 24.98 kg/m  General:  Well developed, well nourished, no acute distress  Psych:  Alert and orientedx3,normal mood and affect HEENT:  Normocephalic, atraumatic, non-icteric sclera,  supple neck without adenopathy, mass or thyromegaly Cardiovascular:  Normal S1, S2, RRR without gallop, rub or murmur Respiratory:  Good breath sounds bilaterally, CTAB with normal respiratory effort Gastrointestinal: normal bowel sounds, soft, non-tender, no noted masses. No HSM No edema Diabetic Foot Exam: Appearance - no lesions, ulcers or calluses Skin - no sigificant pallor or erythema Monofilament testing - sensitive bilaterally in following locations:  Right - Great toe, medial, central, lateral ball and posterior foot intact  Left - Great toe, medial, central, lateral ball and posterior foot intact Pulses - +2 distally bilaterally   Commons side effects, risks, benefits, and alternatives for medications and treatment plan prescribed today were discussed, and the patient expressed understanding of the given instructions. Patient is instructed to call  or message via MyChart if he/she has any questions or concerns regarding our treatment plan. No barriers to understanding were identified. We discussed Red Flag symptoms and signs in detail. Patient expressed understanding regarding what to do in case of urgent or emergency type symptoms.  Medication list was reconciled, printed and provided to the patient in AVS. Patient instructions and summary information was reviewed with the patient as documented in the AVS. This note was prepared with assistance of Dragon voice recognition software. Occasional wrong-word or sound-a-like substitutions may have occurred due to the inherent limitations of voice recognition software  This visit occurred during the SARS-CoV-2 public health  emergency.  Safety protocols were in place, including screening questions prior to the visit, additional usage of staff PPE, and extensive cleaning of exam room while observing appropriate contact time as indicated for disinfecting solutions.

## 2021-09-01 NOTE — Patient Instructions (Signed)
Please return in 3 months for diabetes follow up   I will release your lab results to you on your MyChart account with further instructions. Please reply with any questions.    Please review your medication list and verify it is correct with the medications and doses that you are taking at home.   If you have any questions or concerns, please don't hesitate to send me a message via MyChart or call the office at [phone]. Thank you for visiting with [place] today! It's our pleasure caring for you.

## 2021-09-02 ENCOUNTER — Other Ambulatory Visit: Payer: Self-pay | Admitting: Family Medicine

## 2021-09-02 ENCOUNTER — Telehealth: Payer: Self-pay | Admitting: *Deleted

## 2021-09-02 LAB — CBC WITH DIFFERENTIAL/PLATELET
Basophils Absolute: 0.1 10*3/uL (ref 0.0–0.1)
Basophils Relative: 1 % (ref 0.0–3.0)
Eosinophils Absolute: 0.3 10*3/uL (ref 0.0–0.7)
Eosinophils Relative: 3.2 % (ref 0.0–5.0)
HCT: 39.2 % (ref 36.0–46.0)
Hemoglobin: 12.4 g/dL (ref 12.0–15.0)
Lymphocytes Relative: 42.3 % (ref 12.0–46.0)
Lymphs Abs: 3.4 10*3/uL (ref 0.7–4.0)
MCHC: 31.7 g/dL (ref 30.0–36.0)
MCV: 87.5 fl (ref 78.0–100.0)
Monocytes Absolute: 0.4 10*3/uL (ref 0.1–1.0)
Monocytes Relative: 5.3 % (ref 3.0–12.0)
Neutro Abs: 3.9 10*3/uL (ref 1.4–7.7)
Neutrophils Relative %: 48.2 % (ref 43.0–77.0)
Platelets: 327 10*3/uL (ref 150.0–400.0)
RBC: 4.48 Mil/uL (ref 3.87–5.11)
RDW: 15.1 % (ref 11.5–15.5)
WBC: 8 10*3/uL (ref 4.0–10.5)

## 2021-09-02 LAB — COMPREHENSIVE METABOLIC PANEL
ALT: 15 U/L (ref 0–35)
AST: 24 U/L (ref 0–37)
Albumin: 4.2 g/dL (ref 3.5–5.2)
Alkaline Phosphatase: 61 U/L (ref 39–117)
BUN: 18 mg/dL (ref 6–23)
CO2: 25 mEq/L (ref 19–32)
Calcium: 9.3 mg/dL (ref 8.4–10.5)
Chloride: 109 mEq/L (ref 96–112)
Creatinine, Ser: 0.89 mg/dL (ref 0.40–1.20)
GFR: 66.82 mL/min (ref >60.00)
Glucose, Bld: 46 mg/dL — CL (ref 70–99)
Potassium: 4 mEq/L (ref 3.5–5.1)
Sodium: 142 mEq/L (ref 135–145)
Total Bilirubin: 0.4 mg/dL (ref 0.2–1.2)
Total Protein: 6.3 g/dL (ref 6.0–8.3)

## 2021-09-02 LAB — B12 AND FOLATE PANEL
Folate: 15.8 ng/mL (ref >5.9)
Vitamin B-12: 119 pg/mL — ABNORMAL LOW (ref 211–911)

## 2021-09-02 LAB — HEMOGLOBIN A1C: Hgb A1c MFr Bld: 6.6 % — ABNORMAL HIGH (ref 4.6–6.5)

## 2021-09-02 LAB — LIPID PANEL
Cholesterol: 128 mg/dL (ref 0–200)
HDL: 41.9 mg/dL (ref >39.00)
LDL Cholesterol: 64 mg/dL (ref 0–99)
NonHDL: 85.97
Total CHOL/HDL Ratio: 3
Triglycerides: 112 mg/dL (ref 0.0–149.0)
VLDL: 22.4 mg/dL (ref 0.0–40.0)

## 2021-09-02 LAB — TSH: TSH: 1.1 u[IU]/mL (ref 0.35–5.50)

## 2021-09-02 LAB — VITAMIN D 25 HYDROXY (VIT D DEFICIENCY, FRACTURES): VITD: 26.32 ng/mL — ABNORMAL LOW (ref 30.00–100.00)

## 2021-09-02 NOTE — Telephone Encounter (Signed)
Esperanza Richters from [facility] lab called with Critical lab results for this patient.  Glucose: 46. Sending to PCP as high priority.

## 2021-09-15 ENCOUNTER — Encounter: Payer: Medicare HMO | Attending: Physical Medicine and Rehabilitation | Admitting: Psychology

## 2021-09-15 ENCOUNTER — Other Ambulatory Visit: Payer: Self-pay

## 2021-09-15 DIAGNOSIS — M5010 Cervical disc disorder with radiculopathy, unspecified cervical region: Secondary | ICD-10-CM | POA: Insufficient documentation

## 2021-09-15 DIAGNOSIS — F339 Major depressive disorder, recurrent, unspecified: Secondary | ICD-10-CM | POA: Insufficient documentation

## 2021-09-15 DIAGNOSIS — G43019 Migraine without aura, intractable, without status migrainosus: Secondary | ICD-10-CM | POA: Diagnosis not present

## 2021-09-15 DIAGNOSIS — G894 Chronic pain syndrome: Secondary | ICD-10-CM | POA: Diagnosis not present

## 2021-09-17 ENCOUNTER — Telehealth: Payer: Self-pay | Admitting: Family Medicine

## 2021-09-17 NOTE — Telephone Encounter (Signed)
Pt would like to talk to someone about Vitamin D that she and Dr. Jonni Sanger discussed in her prior appt. She is unclear of the dosage and times per day.

## 2021-09-18 ENCOUNTER — Encounter: Payer: Self-pay | Admitting: Psychology

## 2021-09-18 NOTE — Progress Notes (Signed)
Neuropsychology Visit  Patient:  Robin Morgan   DOB: 01/22/1956  MR Number: [id number]  Location: [facility] [place], [place] [id number] [place] [id number] Dept: [phone]  Date of Service: 09/15/2021  Start: 1 PM End: 2 PM  Duration of Service: 1 Hour  Today's visit was conducted in my outpatient clinic office with the patient myself present.  Today's visit was an in person visit.  Provider/Observer:     Edgardo Roys PsyD  Chief Complaint:      Chief Complaint  Patient presents with   Back Pain   Pain   Anxiety   Depression    Reason For Service:     Robin Morgan is a 66 year old right-handed female referred by Dr. Naaman Plummer for neuropsychological/psychological consultation due to significant depression and anxiety with primary stressors related to family issues in the setting of fibromyalgia and chronic severe pain symptoms.  The patient reports that she has had significant pain in her back primarily mid back for 20 years or more.  The patient is also been diagnosed with fibromyalgia.  The patient had surgery on her neck to try to alleviate help with her severe recurrent headaches but experience little change post surgery.  The patient also has significant shoulder pain and severe headache with these pain and headaches resulting in her having to spend multiple days at a time essentially in bed.  The patient is continued to struggle with anxiety and depressive symptoms on top of her chronic pain disorder.  The patient was asked to help take care of her father who has dementia and her sisters expected her to stay there for an extended period of time.  The patient was there for almost a week experiencing increasing problems due to poor sleeping conditions and an exacerbation of her underlying chronic pain symptoms.  There continues to be stressors and animosity  between the patient and her siblings.  There continues to be a lot of stress around what is happening with the care of her father and the relationship dynamics between the patient and her 2 sisters.  The patient herself has significant pain and is unable to physically help much with her father but particularly her younger sister is demanding and manipulative of the patient to try to be able to do more.  The patient's description of the younger sister present a picture of a very manipulative narcissistic like behavioral pattern in the younger sister and it is very difficult for her and the 2 other sisters to engage and have the younger sister react appropriately.  Treatment Interventions:  Therapeutic interventions for issues related to chronic pain/fibromyalgia and recurrent headaches and depression anxiety symptoms with significant psychosocial stressors.  Participation Level:   Active  Participation Quality:  Appropriate and Attentive      Behavioral Observation:  Well Groomed, Alert, and Appropriate.   Current Psychosocial Factors: The patient is still continues to struggle with her aging father and his need for help from his 3 daughters.  One of the daughters constantly causes a great deal of discord and distress and the patient's inability to spend the night at her father's house adds to some of the difficulties.  The patient has to take care of herself primarily due to her significant pain condition and how stress and other variables can exacerbate these difficulties.  Content of Session:   Reviewed current symptoms and continue to develop coping  skills around her chronic pain and fibromyalgia symptoms as well as headaches and dealing with cervical postlaminectomy syndrome.  We also worked on significant psychosocial stressors that are being impacted by the patient's limited physical functioning as well as her stressors having a significant deleterious on her overall pain  symptoms.  Effectiveness of Interventions: The patient has been quite open and active in these therapeutic processes and rapport been easily established.  The patient's cognitive function is good and while stress affects her ability to stay focused this is not indicative of any type of cognitive deficits with the patient.  The patient is already working on some of the initial issues we have focused around sleep hygiene, physical activity and other coping issues around her chronic pain symptoms.  Target Goals:   Goals include building better coping and adaptive skills around her chronic pain symptoms and working on better pain management as well as working on significant psychosocial stressors that are negatively impacting her current medical issues.  Goals Last Reviewed:   09/15/2021  Goals Addressed Today:    Today we worked a lot on the various stressors in her life that are exacerbating her chronic pain symptoms.  Impression/Diagnosis:   Robin Morgan is a 66 year old right-handed female referred by Dr. Naaman Plummer for neuropsychological/psychological consultation due to significant depression and anxiety with primary stressors related to family issues in the setting of fibromyalgia and chronic severe pain symptoms.  The patient reports that she has had significant pain in her back primarily mid back for 20 years or more.  The patient is also been diagnosed with fibromyalgia.  The patient had surgery on her neck to try to alleviate help with her severe recurrent headaches but experience little change post surgery.  The patient also has significant shoulder pain and severe headache with these pain and headaches resulting in her having to spend multiple days at a time essentially in bed.   I do think that the patient's anxiety, stress and depressive symptoms do play and exacerbating role in her overall pain symptoms and while she clearly has abnormalities in lumbar and cervical regions as a primary factor  for her pain symptoms her stress responses, fibromyalgia and depression do play a role in the acute day-to-day level of her pain.  Today we have continue to work on therapeutic interventions around chronic pain and building better coping strategies for the numerous stressors that she is facing particularly around issues with her family and the impact that stress has on her chronic pain difficulties.  Diagnosis:   Cervical disc disorder with radiculopathy of cervical region  Chronic pain syndrome  Migraine without aura, intractable, without status migrainosus  Depression, recurrent (HCC)  Chronic pain associated with significant psychosocial dysfunction    Ilean Skill, Psy.D. Clinical Psychologist Neuropsychologist

## 2021-09-19 NOTE — Telephone Encounter (Signed)
LVM for patient to call back. ?

## 2021-09-22 ENCOUNTER — Encounter: Payer: Self-pay | Admitting: Family Medicine

## 2021-09-23 NOTE — Telephone Encounter (Signed)
Lease advise.

## 2021-09-25 ENCOUNTER — Other Ambulatory Visit: Payer: Self-pay | Admitting: Registered Nurse

## 2021-09-25 DIAGNOSIS — M797 Fibromyalgia: Secondary | ICD-10-CM

## 2021-10-08 ENCOUNTER — Other Ambulatory Visit: Payer: Self-pay

## 2021-10-08 ENCOUNTER — Encounter: Payer: Self-pay | Admitting: Physical Medicine & Rehabilitation

## 2021-10-08 ENCOUNTER — Encounter: Payer: Medicare HMO | Attending: Physical Medicine and Rehabilitation | Admitting: Physical Medicine & Rehabilitation

## 2021-10-08 VITALS — BP 109/65 | HR 100 | Ht 61.0 in | Wt 133.4 lb

## 2021-10-08 DIAGNOSIS — G894 Chronic pain syndrome: Secondary | ICD-10-CM | POA: Diagnosis not present

## 2021-10-08 DIAGNOSIS — M7918 Myalgia, other site: Secondary | ICD-10-CM | POA: Diagnosis not present

## 2021-10-08 DIAGNOSIS — Z79899 Other long term (current) drug therapy: Secondary | ICD-10-CM

## 2021-10-08 DIAGNOSIS — G5602 Carpal tunnel syndrome, left upper limb: Secondary | ICD-10-CM | POA: Diagnosis not present

## 2021-10-08 DIAGNOSIS — Z5181 Encounter for therapeutic drug level monitoring: Secondary | ICD-10-CM

## 2021-10-08 DIAGNOSIS — M797 Fibromyalgia: Secondary | ICD-10-CM | POA: Diagnosis not present

## 2021-10-08 DIAGNOSIS — M5010 Cervical disc disorder with radiculopathy, unspecified cervical region: Secondary | ICD-10-CM

## 2021-10-08 MED ORDER — MORPHINE SULFATE 15 MG PO TABS: ORAL_TABLET | ORAL | 0 refills | Status: DC

## 2021-10-08 MED ORDER — MORPHINE SULFATE 15 MG PO TABS: 15.0000 mg | ORAL_TABLET | Freq: Three times a day (TID) | ORAL | 0 refills | Status: DC | PRN

## 2021-10-08 NOTE — Patient Instructions (Addendum)
PLEASE FEEL FREE TO CALL OUR OFFICE WITH ANY PROBLEMS OR QUESTIONS ([phone]) ? ?-GET OUTSIDE ?-FIND SOME FUN THINGS TO DO ?-PUT   SISTER #3 IN A "BOX" AND DEAL WITH HER ONLY YOU WHEN YOU HAVE TO. DON'T THINKG ABOUT HER OTHERWISE ?-IMPROVE YOUR NUTRITION. MORE PROTEIN.  ?-MORE EXERCISE ? ? ?                ?

## 2021-10-08 NOTE — Progress Notes (Signed)
Subjective:    Patient ID: Robin Morgan, female    DOB: [birth year], 66 y.o.   MRN: [id number]  HPI [name] is here in follow up of her chronic pain. She has been having headaches which last for days on end over the last week or so. Topamax had been helping previously. More significantly, she is having pain in her left scapula which bothers her most when pressure is put on the area with radiation into proximal lateral arm.  This has been an ongoing issue.  We have injected her shoulder in the past and done some trigger point injections remotely.  She still not doing a lot in the way of exercising or stretching.  She seems to be at home a lot.  She still stresses a bit over her family situation and 1 sister that nobody seems to get along with.  She complains of ongoing fatigue.  She saw her primary who started her on vitamin B12 and vitamin D and calcium supplements.  I reviewed her labs which demonstrated deficiencies in both.  She remains on thyroid supplementation as well.  For headaches she is using Topamax as noted above.  She is also on morphine sulfate immediate release 2-3 times a day for generalized pain.   Pain Inventory Average Pain 9 Pain Right Now 7 My pain is constant and aching  In the last 24 hours, has pain interfered with the following? General activity 10 Relation with others 9 Enjoyment of life 9 What TIME of day is your pain at its worst? morning , daytime, evening, and night Sleep (in general) Poor  Pain is worse with: sitting Pain improves with: rest and medication Relief from Meds: 8  Family History  Problem Relation Age of Onset   Alzheimer's disease Mother    Diabetes Father    Heart disease Maternal Grandmother    Breast cancer Paternal Grandmother 66   Social History   Socioeconomic History   Marital status: Divorced    Spouse name: Not on file   Number of children: 1   Years of education: Not on file   Highest education level: Not on file   Occupational History   Occupation: disabled  Tobacco Use   Smoking status: Never   Smokeless tobacco: Never  Vaping Use   Vaping Use: Never used  Substance and Sexual Activity   Alcohol use: No   Drug use: No   Sexual activity: Not Currently  Other Topics Concern   Not on file  Social History Narrative   Not on file   Social Determinants of Health   Financial Resource Strain: Low Risk    Difficulty of Paying Living Expenses: Not hard at all  Food Insecurity: No Food Insecurity   Worried About Charity fundraiser in the Last Year: Never true   [place] in the Last Year: Never true  Transportation Needs: No Transportation Needs   Lack of Transportation (Medical): No   Lack of Transportation (Non-Medical): No  Physical Activity: Inactive   Days of Exercise per Week: 0 days   Minutes of Exercise per Session: 0 min  Stress: No Stress Concern Present   Feeling of Stress : Only a little  Social Connections: Socially Isolated   Frequency of Communication with Friends and Family: Twice a week   Frequency of Social Gatherings with Friends and Family: Once a week   Attends Religious Services: Never   Marine scientist or Organizations: No   Attends  Club or Organization Meetings: Never   Marital Status: Divorced   Past Surgical History:  Procedure Laterality Date   ABDOMINAL [place]   BREAST EXCISIONAL BIOPSY Right 1990's   BREAST SURGERY     rt breast cyst done in the 90's   [place]     right   [place]   Past Surgical History:  Procedure Laterality Date   ABDOMINAL [place]   BREAST EXCISIONAL BIOPSY Right 1990's   BREAST SURGERY     rt breast cyst done in the 90's   [place]     right   SPINE SURGERY      TONSILLECTOMY     1973   Past Medical History:  Diagnosis Date   Calcific tendonitis    Cervical spondylosis without myelopathy    Depression    Diabetes mellitus    Esophageal stricture    Fibromyalgia    GERD (gastroesophageal reflux disease)    Hiatal hernia    Hyperlipidemia    Hypertension    BP 109/65    Pulse 100    Ht 5\' 1"  (1.549 m)    Wt 133 lb 6.4 oz (60.5 kg)    SpO2 94%    BMI 25.21 kg/m   Opioid Risk Score:   Fall Risk Score:  `1  Depression screen PHQ 2/9  Depression screen [facility] 2/9 10/08/2021 09/01/2021 07/02/2021 04/23/2021 12/06/2020 09/20/2020 02/19/2020  Decreased Interest 0 2 1 1  0 3 3  Down, Depressed, Hopeless 1 0 1 1 1 2 3   PHQ - 2 Score 1 2 2 2 1 5 6   Altered sleeping - 0 - - - 3 3  Tired, decreased energy - 2 - - - 3 3  Change in appetite - 0 - - - 3 3  Feeling bad or failure about yourself  - 0 - - - 0 1  Trouble concentrating - 0 - - - 0 3  Moving slowly or fidgety/restless - 0 - - - 0 0  Suicidal thoughts - 0 - - - 0 0  PHQ-9 Score - 4 - - - 14 19  Difficult doing work/chores - Somewhat difficult - - - Somewhat difficult Very difficult  Some recent data might be hidden     Review of Systems  Constitutional: Negative.   HENT: Negative.    Eyes: Negative.   Respiratory: Negative.    Cardiovascular: Negative.   Gastrointestinal: Negative.   Endocrine: Negative.   Genitourinary: Negative.   Musculoskeletal:  Positive for back pain.  Skin: Negative.   Allergic/Immunologic: Negative.   Neurological:  Positive for headaches.  Hematological: Negative.   Psychiatric/Behavioral:  Positive for dysphoric mood and sleep disturbance.       Objective:   Physical Exam   General: No acute distress HEENT: NCAT, EOMI, oral membranes moist Cards: reg rate  Chest: normal effort Abdomen: Soft, NT, ND Skin: dry, intact Extremities: no edema Psych: appears down, pleasant as always Neuro:  Patient is cognitively appropriate.  Neuro cranial nerve exam.   Normal insight and awareness.  Strength is grossly 4 out of 5 in both  upper extremities proximal distal.  Reflexes are 1-2+.  Sensory remains intact in all 4's. . Musculoskeletal: head forward posture. Shoulders tight. Trigger points along mid to lower left rhomboids.      Assessment & Plan:     1. Fibromyalgia/myofascial pain involving the left shoulder girdle, particularly the rhomboid area. RELAXATION AND POSTURE DISCUSSED AGAIN MENTAL HEALTH ALSO REVIEWED. NEEDS TO GET OUT MORE, SOCIALIZE -After informed consent and preparation of the skin with isopropyl alcohol, I injected the left rhomboids (x2) with 2cc of 1% lidocaine. The patient tolerated well, and no complications were experienced. Post-injection instructions were provided.  -B12, vitamin D supplements. Continue thyroid supplementation also -adequate sleep  2. Rotator cuff syndrome bilaterally.            -HEP            -  3. Cervicalgia. Post-laminectomy syndrome, facet arthropathy:  Cervical Radiculopathy:     Continue Gabapentin.           -MS IR 15mg  q12 prn #80. RF today            We will continue the controlled substance monitoring program, this consists of regular clinic visits, examinations, routine drug screening, pill counts as well as use of [place] Controlled Substance Reporting System. [place] was reviewed today.    Medication was refilled and a second prescription was sent to the patient's pharmacy for next month.    -UDS           -? cervical MBB's of cervical spine           -C7-T1 ESI without beneift. Repeat injections with some benefit           C5-6 foraminal stenosis on MRI-ESI's without much benefit              4. Depression/sleep: Continue Cymbalta.   60mg  bid        -continue neuropsych for pain mgt techniques, coping skills==very helpful             -melatonin 3 to 6 mg at bedtime.   6. Lumbar Radiculopathy:  HEP 7 OA of right hand:    Voltaren gel and heat therapy.              . 8.  Migraines: stable, largely driven by shoulder girdle and neck pain.             -CONTINUE topamax at 100 mg qhs     9. Left CTS:S/P EMG on 03/08/2017: S/P Carpal Tunnel Release on 06/14/2017 via . Dr. Apolonio Schneiders.  10. OIC: movantik           -continue diet/probiotic and diet   15 min of face to face patient care time were spent during this visit. All questions were encouraged and answered.  Follow up with NP in 2 mos .

## 2021-10-11 LAB — TOXASSURE SELECT,+ANTIDEPR,UR

## 2021-10-13 ENCOUNTER — Telehealth: Payer: Self-pay | Admitting: *Deleted

## 2021-10-13 NOTE — Telephone Encounter (Signed)
Urine drug screen for this encounter is consistent for prescribed medication 

## 2021-11-07 ENCOUNTER — Other Ambulatory Visit: Payer: Self-pay

## 2021-11-07 ENCOUNTER — Telehealth: Payer: Self-pay

## 2021-11-07 ENCOUNTER — Other Ambulatory Visit: Payer: Self-pay | Admitting: Family Medicine

## 2021-11-07 MED ORDER — ZOLPIDEM TARTRATE 10 MG PO TABS
10.0000 mg | ORAL_TABLET | Freq: Every evening | ORAL | 5 refills | Status: DC | PRN
Start: 2021-11-07 — End: 2022-06-26

## 2021-11-07 NOTE — Telephone Encounter (Signed)
Pt pharmacy requesting Zolpidem Tartrate 10 mg tab ? ?LOV: 09/01/2021 ?Next Visit: 12/01/2021 ?Last Px: 02/03/2021- #30-5 ? ?Please advise for refill. ? ?

## 2021-11-11 ENCOUNTER — Other Ambulatory Visit: Payer: Self-pay | Admitting: Family Medicine

## 2021-11-21 ENCOUNTER — Other Ambulatory Visit: Payer: Self-pay

## 2021-11-21 MED ORDER — ATORVASTATIN CALCIUM 40 MG PO TABS: 40.0000 mg | ORAL_TABLET | Freq: Every day | ORAL | 3 refills | Status: DC

## 2021-12-01 ENCOUNTER — Ambulatory Visit: Payer: Medicare HMO | Admitting: Family Medicine

## 2021-12-03 ENCOUNTER — Encounter: Payer: Self-pay | Admitting: Family Medicine

## 2021-12-03 ENCOUNTER — Ambulatory Visit (INDEPENDENT_AMBULATORY_CARE_PROVIDER_SITE_OTHER): Payer: Medicare HMO | Admitting: Family Medicine

## 2021-12-03 VITALS — BP 118/60 | HR 97 | Temp 98.0°F | Ht 61.0 in | Wt 135.0 lb

## 2021-12-03 DIAGNOSIS — M25512 Pain in left shoulder: Secondary | ICD-10-CM | POA: Diagnosis not present

## 2021-12-03 DIAGNOSIS — R32 Unspecified urinary incontinence: Secondary | ICD-10-CM | POA: Diagnosis not present

## 2021-12-03 DIAGNOSIS — E559 Vitamin D deficiency, unspecified: Secondary | ICD-10-CM | POA: Diagnosis not present

## 2021-12-03 DIAGNOSIS — E1142 Type 2 diabetes mellitus with diabetic polyneuropathy: Secondary | ICD-10-CM | POA: Diagnosis not present

## 2021-12-03 DIAGNOSIS — E1140 Type 2 diabetes mellitus with diabetic neuropathy, unspecified: Secondary | ICD-10-CM

## 2021-12-03 DIAGNOSIS — E538 Deficiency of other specified B group vitamins: Secondary | ICD-10-CM

## 2021-12-03 DIAGNOSIS — Z79899 Other long term (current) drug therapy: Secondary | ICD-10-CM

## 2021-12-03 DIAGNOSIS — G8929 Other chronic pain: Secondary | ICD-10-CM

## 2021-12-03 DIAGNOSIS — Z794 Long term (current) use of insulin: Secondary | ICD-10-CM

## 2021-12-03 LAB — POCT GLYCOSYLATED HEMOGLOBIN (HGB A1C): Hemoglobin A1C: 6.4 % — AB (ref 4.0–5.6)

## 2021-12-03 LAB — POCT URINALYSIS DIPSTICK
Bilirubin, UA: NEGATIVE
Blood, UA: NEGATIVE
Glucose, UA: NEGATIVE
Ketones, UA: NEGATIVE
Leukocytes, UA: NEGATIVE
Nitrite, UA: NEGATIVE
Protein, UA: NEGATIVE
Spec Grav, UA: 1.02 (ref 1.010–1.025)
Urobilinogen, UA: 0.2 E.U./dL
pH, UA: 5 (ref 5.0–8.0)

## 2021-12-03 LAB — MICROALBUMIN / CREATININE URINE RATIO
Creatinine,U: 43.5 mg/dL
Microalb Creat Ratio: 1.6 mg/g (ref 0.0–30.0)
Microalb, Ur: <0.7 mg/dL (ref 0.0–1.9)

## 2021-12-03 LAB — VITAMIN D 25 HYDROXY (VIT D DEFICIENCY, FRACTURES): VITD: 45.55 ng/mL (ref 30.00–100.00)

## 2021-12-03 LAB — VITAMIN B12: Vitamin B-12: 888 pg/mL (ref 211–911)

## 2021-12-03 NOTE — Progress Notes (Signed)
? ? ?Subjective  ?CC:  ?Chief Complaint  ?Patient presents with  ? Diabetes  ?  Pt her for F/U with Diabetes and stated that she stays very thirsty.   ? Urinary Frequency  ?  Also having frequent urination for the past 2 mos. No burning/itching. ?  ? ? ?HPI: Robin Morgan is a 66 y.o. female who presents to the office today for follow up of diabetes and problems listed above in the chief complaint.  ?Diabetes follow up: Her diabetic control is reported as Unchanged. Fastings avg 120s. Denies lows. On bid lantus and metformin and Farxiga.  She denies exertional CP or SOB or symptomatic hypoglycemia. She denies foot sores or paresthesias.  ?Complains of chronic left shoulder pain that is worsening.  Has chronic pain syndrome and is on chronic narcotics and managed by pain management.  However left shoulder pain is worsening.  Seems to be in the joint.  Limited range of motion.  Requesting help.  No injury. ?Polypharmacy: Managed by chronic pain and neurology for headaches.  Reviewed medication list.  High risk ?B12 and vitamin D deficiencies diagnosed in January.  Now on supplements.  Due for recheck. ?Complains of worsening urinary incontinence.  Wears a pad daily.  Will leak throughout the day.  Some of her symptoms.  No irritative symptoms. ? ?Wt Readings from Last 3 Encounters:  ?12/03/21 135 lb (61.2 kg)  ?10/08/21 133 lb 6.4 oz (60.5 kg)  ?09/01/21 132 lb 3.2 oz (60 kg)  ? ? ?BP Readings from Last 3 Encounters:  ?12/03/21 118/60  ?10/08/21 109/65  ?09/01/21 112/74  ? ? ?Assessment  ?1. Controlled type 2 diabetes mellitus with diabetic neuropathy, with long-term current use of insulin ([place])   ?2. Diabetic peripheral neuropathy associated with type 2 diabetes mellitus ([place])   ?3. Vitamin B12 deficiency   ?4. Vitamin D deficiency   ?5. Chronic left shoulder pain   ?6. Urinary incontinence, unspecified type   ?7. Polypharmacy   ? ?  ?Plan  ?Diabetes is currently well controlled.  Continue Basaglar, metformin  twice daily, [place] daily. ?Refer to urology given worsening urinary incontinence: Possibly neurologic and combination.  Given polypharmacy, would prefer evaluation prior to starting medications. ?Refer to sports medicine to see if can help with left shoulder pain.  Has limited range of motion. ?Recheck vitamin B12 and D levels today. ? ? ?Follow up: 3 months for recheck. ?Orders Placed This Encounter  ?Procedures  ? Microalbumin / creatinine urine ratio  ? VITAMIN D 25 Hydroxy (Vit-D Deficiency, Fractures)  ? Vitamin B12  ? Ambulatory referral to Urology  ? Ambulatory referral to Sports Medicine  ? POCT HgB A1C  ? POCT urinalysis dipstick  ? ?No orders of the defined types were placed in this encounter. ? ?  ? ?Immunization History  ?Administered Date(s) Administered  ? Fluad Quad(high Dose 65+) 04/06/2019, 09/01/2021  ? Influenza Split 06/04/2009, 05/02/2012  ? Influenza, High Dose Seasonal PF 05/13/2020  ? Influenza, Quadrivalent, Recombinant, Inj, Pf 05/12/2016, 07/07/2017  ? Influenza, Seasonal, Injecte, Preservative Fre 06/06/2014, 04/24/2015  ? Influenza,inj,Quad PF,6+ Mos 05/12/2016, 07/07/2017, 05/05/2018  ? Influenza-Unspecified 09/03/2011, 04/19/2013  ? PFIZER(Purple Top)SARS-COV-2 Vaccination 10/03/2019, 10/24/2019, 06/28/2020  ? Pneumococcal Conjugate-13 12/01/2019  ? Pneumococcal Polysaccharide-23 08/29/2009, 10/19/2018  ? Tdap 05/11/2012  ? ? ?Diabetes Related Lab Review: ?Lab Results  ?Component Value Date  ? HGBA1C 6.4 (A) 12/03/2021  ? HGBA1C 6.6 (H) 09/01/2021  ? HGBA1C 7.2 (H) 09/20/2020  ?  ?Lab Results  ?  Component Value Date  ? MICROALBUR <0.7 06/05/2019  ? ?Lab Results  ?Component Value Date  ? CREATININE 0.89 09/01/2021  ? BUN 18 09/01/2021  ? NA 142 09/01/2021  ? K 4.0 09/01/2021  ? CL 109 09/01/2021  ? CO2 25 09/01/2021  ? ?Lab Results  ?Component Value Date  ? CHOL 128 09/01/2021  ? CHOL 116 05/27/2020  ? CHOL 140 06/05/2019  ? ?Lab Results  ?Component Value Date  ? HDL 41.90 09/01/2021   ? HDL 39 (L) 05/27/2020  ? HDL 36.50 (L) 06/05/2019  ? ?Lab Results  ?Component Value Date  ? [place] 64 09/01/2021  ? [place] 56 05/27/2020  ? [place] 73 06/05/2019  ? ?Lab Results  ?Component Value Date  ? TRIG 112.0 09/01/2021  ? TRIG 124 05/27/2020  ? TRIG 151.0 (H) 06/05/2019  ? ?Lab Results  ?Component Value Date  ? CHOLHDL 3 09/01/2021  ? CHOLHDL 3.0 05/27/2020  ? CHOLHDL 4 06/05/2019  ? ?No results found for: LDLDIRECT ?The ASCVD Risk score (Arnett DK, et al., 2019) failed to calculate for the following reasons: ?  The valid total cholesterol range is 130 to 320 mg/dL ?I have reviewed the [place], Fam and Soc history. ?Patient Active Problem List  ? Diagnosis Date Noted  ? Diabetic peripheral neuropathy associated with type 2 diabetes mellitus ([place]) 01/06/2017  ?  Priority: High  ? OSA (obstructive sleep apnea) 03/10/2016  ?  Priority: High  ? Controlled diabetes mellitus with diabetic neuropathy, with long-term current use of insulin ([place]) 10/11/2014  ?  Priority: High  ? Hypertension associated with diabetes ([place]) 10/11/2014  ?  Priority: High  ? Combined hyperlipidemia associated with type 2 diabetes mellitus ([place]) 10/11/2014  ?  Priority: High  ? DDD (degenerative disc disease), lumbar 01/15/2014  ?  Priority: High  ? Hypothyroidism, acquired 06/12/2013  ?  Priority: High  ? Cervical post-laminectomy syndrome 05/10/2013  ?  Priority: High  ? Chronic pain associated with significant psychosocial dysfunction 01/03/2010  ?  Priority: High  ?  Chronic Pain Syndrome, Dr. Earl Lites ? ? ?  ? Esophageal stricture   ?  Priority: Medium   ? Tenosynovitis, wrist 10/07/2017  ?  Priority: Medium   ? Trigger thumb of left hand 10/07/2017  ?  Priority: Medium   ? Laryngopharyngeal reflux (LPR) 08/26/2017  ?  Priority: Medium   ? Dysphagia 08/26/2017  ?  Priority: Medium   ? Left carpal tunnel syndrome 03/08/2017  ?  Priority: Medium   ? Fibromyalgia 02/12/2016  ?  Priority: Medium   ? Osteoporosis 12/01/2012  ?   Priority: Medium   ?  01/2021: osteoporosis T= -2.7 wrist. Spine excluded. Offer tx.  ?10/2017: osteopenia: T = -1.9; stable. Recheck 2 years ?4/ 2014 T = -1.2 at hip, -1.8 at spine ? ?  ? Chronic diarrhea 07/04/2012  ?  Priority: Medium   ?  Overview:  ?? Related to gallbladder surgery. Has been evaluated by Dr. Amedeo Plenty GI. Lomotil as needed. ? ?  ? Rotator cuff syndrome, left 12/11/2020  ? Migraine without aura, intractable, without status migrainosus 07/31/2020  ? Cervical disc disorder with radiculopathy of cervical region 07/31/2020  ? Biceps tendinitis, right 02/07/2020  ? Mid back pain 12/06/2019  ? Insomnia due to medical condition 12/01/2019  ? Chronic prescription opiate use 09/02/2018  ? Myofascial muscle pain 10/28/2011  ? Depression, recurrent ([place]) 10/28/2011  ? ? ?Social History: ?Patient  reports that she has never smoked. She has  never used smokeless tobacco. She reports that she does not drink alcohol and does not use drugs. ? ?Review of Systems: ?Ophthalmic: negative for eye pain, loss of vision or double vision ?Cardiovascular: negative for chest pain ?Respiratory: negative for SOB or persistent cough ?Gastrointestinal: negative for abdominal pain ?Genitourinary: negative for dysuria or gross hematuria ?MSK: negative for foot lesions ?Neurologic: negative for weakness or gait disturbance ? ?Objective  ?Vitals: BP 118/60   Pulse 97   Temp 98 ?F (36.7 ?C)   Ht '5\' 1"'$  (1.549 m)   Wt 135 lb (61.2 kg)   SpO2 95%   BMI 25.51 kg/m?  ?General: well appearing, no acute distress  ?Psych:  Alert and oriented, normal mood and affect ?HEENT:  Normocephalic, atraumatic, moist mucous membranes, supple neck  ?Cardiovascular:  Nl S1 and S2, RRR without murmur, gallop or rub. no edema ?Respiratory:  Good breath sounds bilaterally, CTAB with normal effort, no rales ?Left shoulder: Pain with abduction of the past 80 degrees.  Decreased passive range of motion. ? ? ? ?Diabetic education: ongoing education regarding  chronic disease management for diabetes was given today. We continue to reinforce the ABC's of diabetic management: A1c (<7 or 8 dependent upon patient), tight blood pressure control, and cholesterol management with g

## 2021-12-08 ENCOUNTER — Encounter: Payer: Medicare HMO | Attending: Physical Medicine and Rehabilitation | Admitting: Registered Nurse

## 2021-12-08 ENCOUNTER — Encounter: Payer: Self-pay | Admitting: Registered Nurse

## 2021-12-08 VITALS — BP 117/75 | HR 99 | Ht 61.0 in | Wt 135.8 lb

## 2021-12-08 DIAGNOSIS — G43019 Migraine without aura, intractable, without status migrainosus: Secondary | ICD-10-CM

## 2021-12-08 DIAGNOSIS — M542 Cervicalgia: Secondary | ICD-10-CM | POA: Diagnosis not present

## 2021-12-08 DIAGNOSIS — M778 Other enthesopathies, not elsewhere classified: Secondary | ICD-10-CM | POA: Insufficient documentation

## 2021-12-08 DIAGNOSIS — M546 Pain in thoracic spine: Secondary | ICD-10-CM | POA: Insufficient documentation

## 2021-12-08 DIAGNOSIS — G8929 Other chronic pain: Secondary | ICD-10-CM

## 2021-12-08 DIAGNOSIS — R5381 Other malaise: Secondary | ICD-10-CM

## 2021-12-08 DIAGNOSIS — Z79899 Other long term (current) drug therapy: Secondary | ICD-10-CM | POA: Diagnosis not present

## 2021-12-08 DIAGNOSIS — M7582 Other shoulder lesions, left shoulder: Secondary | ICD-10-CM

## 2021-12-08 DIAGNOSIS — G894 Chronic pain syndrome: Secondary | ICD-10-CM | POA: Diagnosis not present

## 2021-12-08 DIAGNOSIS — M797 Fibromyalgia: Secondary | ICD-10-CM

## 2021-12-08 DIAGNOSIS — Z5181 Encounter for therapeutic drug level monitoring: Secondary | ICD-10-CM | POA: Diagnosis not present

## 2021-12-08 DIAGNOSIS — M7918 Myalgia, other site: Secondary | ICD-10-CM

## 2021-12-08 MED ORDER — MORPHINE SULFATE 15 MG PO TABS: 15.0000 mg | ORAL_TABLET | Freq: Three times a day (TID) | ORAL | 0 refills | Status: DC | PRN

## 2021-12-08 NOTE — Progress Notes (Signed)
? ?Subjective:  ? ? Patient ID: Robin Morgan, female    DOB: 1956/08/09, 66 y.o.   MRN: [id number] ? ?HPI: Robin Morgan is a 66 y.o. female who returns for follow up appointment for chronic pain and medication refill. She states her pain is located in her neck, bilateral shoulders and mid- back. She  rates her pain 7. Her current exercise regime is walking and performing stretching exercises. ? ?Robin Morgan states last week she was stooping down to help her neighbor , she lost her balance and fell on her right side. Her neighbor helped her up, she didn't seek medical attention. Robin Morgan was educated on Falls Prevention, she verbalizes understanding. ? ?Robin Morgan Morphine equivalent is 40.00 MME.   Last UDS was Performed on 10/08/2021, it was consistent.  ?  ? ?Pain Inventory ?Average Pain 9 ?Pain Right Now 7 ?My pain is constant, burning, and aching ? ?In the last 24 hours, has pain interfered with the following? ?General activity 9 ?Relation with others 7 ?Enjoyment of life 8 ?What TIME of day is your pain at its worst? morning , evening, and night ?Sleep (in general) Poor ? ?Pain is worse with: walking and standing ?Pain improves with: rest and medication ?Relief from Meds: 8 ? ?Family History  ?Problem Relation Age of Onset  ? Alzheimer's disease Mother   ? Diabetes Father   ? Heart disease Maternal Grandmother   ? Breast cancer Paternal Grandmother 53  ? ?Social History  ? ?Socioeconomic History  ? Marital status: Divorced  ?  Spouse name: Not on file  ? Number of children: 1  ? Years of education: Not on file  ? Highest education level: Not on file  ?Occupational History  ? Occupation: disabled  ?Tobacco Use  ? Smoking status: Never  ? Smokeless tobacco: Never  ?Vaping Use  ? Vaping Use: Never used  ?Substance and Sexual Activity  ? Alcohol use: No  ? Drug use: No  ? Sexual activity: Not Currently  ?Other Topics Concern  ? Not on file  ?Social History Narrative  ? Not on file  ? ?Social Determinants of  Health  ? ?Financial Resource Strain: Not on file  ?Food Insecurity: Not on file  ?Transportation Needs: Not on file  ?Physical Activity: Not on file  ?Stress: Not on file  ?Social Connections: Not on file  ? ?Past Surgical History:  ?Procedure Laterality Date  ? ABDOMINAL HYSTERECTOMY    ? 1986  ? APPENDECTOMY    ? 1983  ? BREAST EXCISIONAL BIOPSY Right 1990's  ? BREAST SURGERY    ? rt breast cyst done in the 90's  ? CHOLECYSTECTOMY    ? GALLBLADDER SURGERY    ? 1983  ? ROTATOR CUFF REPAIR    ? right  ? SPINE SURGERY    ? TONSILLECTOMY    ? 1973  ? ?Past Surgical History:  ?Procedure Laterality Date  ? ABDOMINAL HYSTERECTOMY    ? 1986  ? APPENDECTOMY    ? 1983  ? BREAST EXCISIONAL BIOPSY Right 1990's  ? BREAST SURGERY    ? rt breast cyst done in the 90's  ? CHOLECYSTECTOMY    ? GALLBLADDER SURGERY    ? 1983  ? ROTATOR CUFF REPAIR    ? right  ? SPINE SURGERY    ? TONSILLECTOMY    ? 1973  ? ?Past Medical History:  ?Diagnosis Date  ? Calcific tendonitis   ? Cervical spondylosis without myelopathy   ?  Depression   ? Diabetes mellitus   ? Esophageal stricture   ? Fibromyalgia   ? GERD (gastroesophageal reflux disease)   ? Hiatal hernia   ? Hyperlipidemia   ? Hypertension   ? ?BP 117/75   Pulse 99   Ht '5\' 1"'$  (1.549 m)   Wt 135 lb 12.8 oz (61.6 kg)   SpO2 94%   BMI 25.66 kg/m?  ? ?Opioid Risk Score:   ?Fall Risk Score:  `1 ? ?Depression screen PHQ 2/9 ? ? ?  12/08/2021  ?  1:45 PM 10/08/2021  ?  2:27 PM 09/01/2021  ?  2:09 PM 07/02/2021  ?  2:03 PM 04/23/2021  ?  1:32 PM 12/06/2020  ?  2:11 PM 09/20/2020  ? 11:51 AM  ?Depression screen PHQ 2/9  ?Decreased Interest 0 0 '2 1 1 '$ 0 3  ?Down, Depressed, Hopeless 0 1 0 '1 1 1 2  '$ ?PHQ - 2 Score 0 '1 2 2 2 1 5  '$ ?Altered sleeping   0    3  ?Tired, decreased energy   2    3  ?Change in appetite   0    3  ?Feeling bad or failure about yourself    0    0  ?Trouble concentrating   0    0  ?Moving slowly or fidgety/restless   0    0  ?Suicidal thoughts   0    0  ?PHQ-9 Score   4    14   ?Difficult doing work/chores   Somewhat difficult    Somewhat difficult  ?  ? ?Review of Systems  ?Constitutional: Negative.   ?HENT: Negative.    ?Eyes: Negative.   ?Respiratory: Negative.    ?Cardiovascular: Negative.   ?Gastrointestinal: Negative.   ?Endocrine: Negative.   ?Genitourinary: Negative.   ?Musculoskeletal:  Positive for neck pain.  ?Skin: Negative.   ?Allergic/Immunologic: Negative.   ?Neurological: Negative.   ?Hematological: Negative.   ?Psychiatric/Behavioral: Negative.    ? ?   ?Objective:  ? Physical Exam ?Vitals and nursing note reviewed.  ?Constitutional:   ?   Appearance: Normal appearance.  ?Neck:  ?   Comments: Cervical Paraspinal Tenderness: C-5-C-6 ?Cardiovascular:  ?   Rate and Rhythm: Normal rate and regular rhythm.  ?   Pulses: Normal pulses.  ?   Heart sounds: Normal heart sounds.  ?Pulmonary:  ?   Effort: Pulmonary effort is normal.  ?   Breath sounds: Normal breath sounds.  ?Musculoskeletal:  ?   Cervical back: Normal range of motion and neck supple.  ?   Comments: Normal Muscle Bulk and Muscle Testing Reveals:  ?Upper Extremities: Full ROM and Muscle Strength 5/5 ?Left AC Joint Tenderness ?Thoracic Paraspinal Tenderness: T-1-T-7 Mainly Left Side ? Lower Extremities: Full ROM and Muscle Strength 5/5 ?Arises from Table with Ease ?Narrow Based  Gait  ?   ?Skin: ?   General: Skin is warm and dry.  ?Neurological:  ?   Mental Status: She is alert and oriented to person, place, and time.  ?Psychiatric:     ?   Mood and Affect: Mood normal.     ?   Behavior: Behavior normal.  ? ? ? ? ?   ?Assessment & Plan:  ?1. Fibromyalgia with myofascial pain: Continue home exercise program and heat therapy. Continue current medication regimen with Flexeril. 12/08/2021. ? 2. Rotator cuff syndrome on the right: Continue with stretching exercises and heat therapy. 12/08/2021 ?3. Cervicalgia. Post-laminectomy syndrome, facet arthropathy:  Cervical Radiculopathy: Continue current medication regimen with  Gabapentin. 12/08/2021 ? S/ P  EMG with Dr. Naaman Plummer on 03/08/2017: Diagnosed with Moderate to Severe CTS of Left wrist: 12/08/2021. ?4. Depression: Continue current medication regimen with Cymbalta. PCP Following. Current  dose 90 mg daily. 12/08/2021 ?5. Mid/low back pain/ Lumbar Spondylosis: Continue Current Medication and stretching and heat therapy. 12/08/2021 ?Refilled: Morphine Sulfate IR 15 mg one tablet three times  a day as needed for pain # 80.  ?6. Lumbar Radiculopathy:  Continue current medication regimen with Gabapentin. 12/08/2021 ?7. OA of right hand: Continue Voltaren gel/ May substitute with Voltaren Tablet, she realizes she can't use both and verbalizes understanding. Continue with  heat therapy. 12/08/2021 ?8. Migraines: Continue with headache Journal.  Continue to Monitor  12/08/2021. ?9. Left CTS:S/P EMG on 03/08/2017: S/P Carpal Tunnel Release on 06/14/2017 via . Dr. Apolonio Schneiders. 12/08/2021 ?10.. Muscle Spasm: Continue Tizanidine. Continue to Monitor. 12/08/2021 ?11. Bilateral  Shoulder Pain: Ortho Following: Continue HEP as Tolerated. Continue to Monitor. 12/08/2021  ?12. Fall at Home: Educated on [place] [employer]. She verbalizes understanding. ?13. Physical Debility: She will F/U with her PCP she states for Physical Therapy Referral.   ? ?F/U in 1 month  ? ? ? ? ? ? ? ? ? ?

## 2021-12-09 ENCOUNTER — Other Ambulatory Visit: Payer: Self-pay | Admitting: Family Medicine

## 2021-12-10 ENCOUNTER — Ambulatory Visit: Payer: Medicare HMO | Admitting: Family Medicine

## 2021-12-10 NOTE — Progress Notes (Deleted)
   I, [name], LAT, ATC acting as a scribe for Robin Leader, MD.  Subjective:    CC: L shoulder/arm pain  HPI: Pt is a 66 y/o female c/o L shoulder/arm pain x . Pt is already being seen by [facility], most recently on 12/08/21, for chronic pain syndrome including multiple areas of the body; bilat shoulder, neck low back, L scapular region pain, and Fibromyalgia/myofascial pain. Today, pt locates pain to /.    Neck pain Radiates:  UE Numbness/tingling: UE Weakness: Aggravates: Treatments tried:  Dx imaging: 01/01/20 T-spine XR  03/03/19 C-spine MRI  03/10/16 L-spine XR  01/22/16 C-spine MRI  12/04/15 C-spine XR  Pertinent review of Systems: ***  Relevant historical information: ***   Objective:   There were no vitals filed for this visit. General: Well Developed, well nourished, and in no acute distress.   MSK: ***  Lab and Radiology Results No results found for this or any previous visit (from the past 72 hour(s)). No results found.    Impression and Recommendations:    Assessment and Plan: 66 y.o. female with ***.  PDMP not reviewed this encounter. No orders of the defined types were placed in this encounter.  No orders of the defined types were placed in this encounter.   Discussed warning signs or symptoms. Please see discharge instructions. Patient expresses understanding.   ***

## 2021-12-11 ENCOUNTER — Telehealth: Payer: Self-pay | Admitting: Family Medicine

## 2021-12-11 ENCOUNTER — Ambulatory Visit: Payer: Medicare HMO

## 2021-12-11 NOTE — Telephone Encounter (Signed)
error 

## 2021-12-12 ENCOUNTER — Ambulatory Visit: Payer: Medicare HMO

## 2021-12-16 ENCOUNTER — Ambulatory Visit (INDEPENDENT_AMBULATORY_CARE_PROVIDER_SITE_OTHER): Payer: Medicare HMO

## 2021-12-16 ENCOUNTER — Ambulatory Visit (INDEPENDENT_AMBULATORY_CARE_PROVIDER_SITE_OTHER): Payer: Medicare HMO | Admitting: Family Medicine

## 2021-12-16 VITALS — BP 104/72 | HR 101 | Ht 61.0 in | Wt 136.0 lb

## 2021-12-16 DIAGNOSIS — G8929 Other chronic pain: Secondary | ICD-10-CM

## 2021-12-16 DIAGNOSIS — M47813 Spondylosis without myelopathy or radiculopathy, cervicothoracic region: Secondary | ICD-10-CM | POA: Diagnosis not present

## 2021-12-16 DIAGNOSIS — M25512 Pain in left shoulder: Secondary | ICD-10-CM | POA: Diagnosis not present

## 2021-12-16 DIAGNOSIS — M50321 Other cervical disc degeneration at C4-C5 level: Secondary | ICD-10-CM | POA: Diagnosis not present

## 2021-12-16 DIAGNOSIS — M546 Pain in thoracic spine: Secondary | ICD-10-CM | POA: Diagnosis not present

## 2021-12-16 DIAGNOSIS — M542 Cervicalgia: Secondary | ICD-10-CM

## 2021-12-16 DIAGNOSIS — M50323 Other cervical disc degeneration at C6-C7 level: Secondary | ICD-10-CM | POA: Diagnosis not present

## 2021-12-16 DIAGNOSIS — M62838 Other muscle spasm: Secondary | ICD-10-CM

## 2021-12-16 DIAGNOSIS — M50322 Other cervical disc degeneration at C5-C6 level: Secondary | ICD-10-CM | POA: Diagnosis not present

## 2021-12-16 DIAGNOSIS — R296 Repeated falls: Secondary | ICD-10-CM

## 2021-12-16 DIAGNOSIS — M19012 Primary osteoarthritis, left shoulder: Secondary | ICD-10-CM | POA: Diagnosis not present

## 2021-12-16 NOTE — Patient Instructions (Addendum)
Thank you for coming in today.  ? ?I've referred you to PT at [facility].  Their office will call you to schedule but please let us know if you don't hear from them in one week regarding scheduling. ? ?Please get an Xray today before you leave. ? ?Follow-up: one month (30 min appt) ?

## 2021-12-16 NOTE — Progress Notes (Signed)
? ?I, Robin Morgan, LAT, ATC acting as a scribe for Robin Leader, MD. ? ?Subjective:   ? ?CC: L shoulder/arm pain ? ?HPI: Pt is a 66 y/o female c/o L shoulder/arm pain x 6+ months. Pt is already being seen by [facility], most recently on 12/08/21, for chronic pain syndrome including multiple areas of the body; bilat shoulder, neck low back, L scapular region pain, and Fibromyalgia/myofascial pain. Pt has had 3 "nerve blocks" in her c-spine. Today, pt locates pain to the medial border along the superior angle w/ radiating pain through the whole L arm into hand/fingers. Pt reports a hx of surgery on her c-spine to "stop her HA." Pt suffers from multiple falls, the most recent about 3 weeks ago. ? ?Neck pain: yes ?Radiates: yes ?UE Numbness/tingling: no ?UE Weakness: yes- difficulty dressing, pulling pants up ?Aggravates: worse at night, L-side laying ?Treatments tried: heat, Tylenol, muscle relaxer ? ?Dx imaging: 01/01/20 T-spine XR ? 03/03/19 C-spine MRI ? 03/10/16 L-spine XR ? 01/22/16 C-spine MRI ? 12/04/15 C-spine XR ? ?Pertinent review of Systems: No fevers or chills.  Multiple frequent falls as noted above. ? ?Relevant historical information: Hypertension and diabetes.  Migraines.  Hyperlipidemia. ? ? ?Objective:   ? ?Vitals:  ? 12/16/21 1314  ?BP: 104/72  ?Pulse: (!) 101  ?SpO2: 94%  ? ?General: Well Developed, well nourished, and in no acute distress.  ? ?MSK: C-spine: Normal. ?Nontender to midline. ?Decreased cervical motion. ?Upper extremity strength is intact. ?Reflexes and sensation are intact. ?Left trapezius tender to palpation with decreased scapular motion with pain with shoulder shrug. ? ? ?Left shoulder: Normal-appearing ?Tender palpation left trapezius. ?Decreased shoulder motion to abduction to 90 degrees.  External rotation limited to 10 degrees beyond neutral position.  Functional internal rotation limited posterior iliac crest. ? ?L-spine: Nontender midline.  Intact  lower extremity strength with intact reflexes bilaterally. ? ?Lab and Radiology Results ? ?X-ray images C-spine, T-spine, and left shoulder obtained today personally and independently interpreted ? ?C-spine: Abnormal appearance of C5 question old or compression fracture.  Significant degenerative changes throughout cervical spine especially at C4-5 and below. ? ?T-spine: Multilevel degenerative changes throughout thoracic spine without visible fracture. ? ?Left shoulder: Mild glenohumeral DJD.  Moderate AC DJD.  No acute fractures are present. ? ?Await formal radiology review ? ? ? ?Impression and Recommendations:   ? ?Assessment and Plan: ?66 y.o. female with left trapezius pain. ?Robin Morgan has chronic pain in multiple areas due to multiple causes but the top of the list today is left trapezius pain thought to be due to muscle spasm and dysfunction.  Some of the pain is probably also due to referred pain from her cervical spine which is chronic in nature.  Additionally some of the pain is probably fibromyalgia related as well.  However I think most of the current exacerbation pain is muscle spasm and dysfunction.  She should be a good candidate for physical therapy especially dry needling.  Plan for trial of physical therapy for dry needling for this issue. ? ?Additionally she has had multiple falls relatively recently that she attributes to leg weakness.  Physical exam today does not show isolated leg weakness but my sensitivity to strength testing does not get to be as reliable as hers is.  Plan for fall prevention and leg strengthening for physical therapy as well. ? ?Additionally she has significant lack of left shoulder range of motion that could be part of her trapezius pain.Marland Kitchen  She  does not have severe glenohumeral DJD.  I suspect that a lot of her shoulder dysfunction is frozen shoulder. ? ?Plan on checking back in 1 month.  We can start chipping away at some of these other problems at that time as  well. ? ?She should continue her relationship with her chronic pain doctor.  I hope to work collaboratively with Dr. Naaman Morgan ? ?PDMP not reviewed this encounter. ?Orders Placed This Encounter  ?Procedures  ? DG Cervical Spine 2 or 3 views  ?  Standing Status:   Future  ?  Number of Occurrences:   1  ?  Standing Expiration Date:   01/16/2022  ?  Order Specific Question:   Reason for Exam (SYMPTOM  OR DIAGNOSIS REQUIRED)  ?  Answer:   neck pain  ?  Order Specific Question:   Preferred imaging location?  ?  Answer:   [facility]  ? DG Thoracic Spine W/Swimmers  ?  Standing Status:   Future  ?  Number of Occurrences:   1  ?  Standing Expiration Date:   01/16/2022  ?  Order Specific Question:   Reason for Exam (SYMPTOM  OR DIAGNOSIS REQUIRED)  ?  Answer:   thoracic pain  ?  Order Specific Question:   Preferred imaging location?  ?  Answer:   [facility]  ? DG Shoulder Left  ?  Standing Status:   Future  ?  Number of Occurrences:   1  ?  Standing Expiration Date:   01/16/2022  ?  Order Specific Question:   Reason for Exam (SYMPTOM  OR DIAGNOSIS REQUIRED)  ?  Answer:   L shoulder pain  ?  Order Specific Question:   Preferred imaging location?  ?  Answer:   [facility]  ? Ambulatory referral to Physical Therapy  ?  Referral Priority:   Routine  ?  Referral Type:   Physical Medicine  ?  Referral Reason:   Specialty Services Required  ?  Requested Specialty:   Physical Therapy  ?  Number of Visits Requested:   1  ? ?No orders of the defined types were placed in this encounter. ? ? ?Discussed warning signs or symptoms. Please see discharge instructions. Patient expresses understanding. ? ? ?The above documentation has been reviewed and is accurate and complete Robin Morgan, M.D. ? ?

## 2021-12-18 NOTE — Progress Notes (Signed)
?  X-ray shows some arthritis changes in the neck.

## 2021-12-18 NOTE — Progress Notes (Signed)
Thoracic spine x-ray shows some arthritis changes present in the thoracic spine.

## 2021-12-18 NOTE — Progress Notes (Signed)
Left shoulder x-ray shows mild arthritis changes.

## 2022-01-06 ENCOUNTER — Other Ambulatory Visit: Payer: Self-pay | Admitting: Physical Medicine & Rehabilitation

## 2022-01-06 DIAGNOSIS — G43019 Migraine without aura, intractable, without status migrainosus: Secondary | ICD-10-CM

## 2022-01-07 ENCOUNTER — Ambulatory Visit: Payer: Medicare HMO | Attending: Family Medicine

## 2022-01-07 DIAGNOSIS — R293 Abnormal posture: Secondary | ICD-10-CM | POA: Insufficient documentation

## 2022-01-07 DIAGNOSIS — M6281 Muscle weakness (generalized): Secondary | ICD-10-CM | POA: Diagnosis not present

## 2022-01-07 DIAGNOSIS — M62838 Other muscle spasm: Secondary | ICD-10-CM | POA: Insufficient documentation

## 2022-01-07 DIAGNOSIS — R296 Repeated falls: Secondary | ICD-10-CM | POA: Diagnosis not present

## 2022-01-07 DIAGNOSIS — M542 Cervicalgia: Secondary | ICD-10-CM | POA: Diagnosis not present

## 2022-01-07 NOTE — Therapy (Signed)
OUTPATIENT PHYSICAL THERAPY CERVICAL EVALUATION   Patient Name: Robin Morgan MRN: [id number] DOB:[birth year], 66 y.o., female Today's Date: 01/07/2022   PT End of Session - 01/07/22 1348     Visit Number 1    Number of Visits 17    Date for PT Re-Evaluation 03/04/22    Authorization Type [employer] MCR    PT Start Time 1215    PT Stop Time 7829    PT Time Calculation (min) 43 min    Activity Tolerance Patient tolerated treatment well    Behavior During Therapy WFL for tasks assessed/performed             Past Medical History:  Diagnosis Date   Calcific tendonitis    Cervical spondylosis without myelopathy    Depression    Diabetes mellitus    Esophageal stricture    Fibromyalgia    GERD (gastroesophageal reflux disease)    Hiatal hernia    Hyperlipidemia    Hypertension    Past Surgical History:  Procedure Laterality Date   ABDOMINAL HYSTERECTOMY     1986   APPENDECTOMY     1983   BREAST EXCISIONAL BIOPSY Right 1990's   BREAST SURGERY     rt breast cyst done in the 10's   [place]     right   [place]   Patient Active Problem List   Diagnosis Date Noted   Rotator cuff syndrome, left 12/11/2020   Migraine without aura, intractable, without status migrainosus 07/31/2020   Cervical disc disorder with radiculopathy of cervical region 07/31/2020   Biceps tendinitis, right 02/07/2020   Mid back pain 12/06/2019   Insomnia due to medical condition 12/01/2019   Chronic prescription opiate use 09/02/2018   Esophageal stricture    Tenosynovitis, wrist 10/07/2017   Trigger thumb of left hand 10/07/2017   Laryngopharyngeal reflux (LPR) 08/26/2017   Dysphagia 08/26/2017   Left carpal tunnel syndrome 03/08/2017   Diabetic peripheral neuropathy associated with type 2 diabetes mellitus ([place]) 01/06/2017   OSA (obstructive sleep apnea) 03/10/2016   Fibromyalgia 02/12/2016    Controlled diabetes mellitus with diabetic neuropathy, with long-term current use of insulin ([place]) 10/11/2014   Hypertension associated with diabetes ([place]) 10/11/2014   Combined hyperlipidemia associated with type 2 diabetes mellitus ([place]) 10/11/2014   DDD (degenerative disc disease), lumbar 01/15/2014   Hypothyroidism, acquired 06/12/2013   Cervical post-laminectomy syndrome 05/10/2013   Osteoporosis 12/01/2012   Chronic diarrhea 07/04/2012   Myofascial muscle pain 10/28/2011   Depression, recurrent ([place]) 10/28/2011   Chronic pain associated with significant psychosocial dysfunction 01/03/2010    PCP: Leamon Arnt, MD  REFERRING PROVIDER: Gregor Hams, MD  REFERRING DIAG:  R29.6 (ICD-10-CM) - Frequent falls [phone] (ICD-10-CM) - Trapezius muscle spasm  THERAPY DIAG:  Cervicalgia  Abnormal posture  Muscle weakness (generalized)  Rationale for Evaluation and Treatment Rehabilitation  ONSET DATE: Chronic  SUBJECTIVE:  SUBJECTIVE STATEMENT: Pt presents to PT with reports of severe L upper trap pain and discomfort. This has been going on for quite a while, with no original MOI. Notes referral of pain and occasional N/T down L UE all the way to her hand. Has had a couple nerve blocks which have note changed her symptoms. She also has difficulty getting up from the floor especially after a fall, which has been happening more frequently. Denies injuries or loss of consciousness with fall. Pt states that she has a lot of trouble getting up from the floor, would like to be able to do this better.   PERTINENT HISTORY:  DM, HTN, Fibromyalgia  PAIN:  Are you having pain?  Yes: NPRS scale: 6/10 (10/10 at worst) Pain location: L upper trap Pain description: sharp  Aggravating  factors: laying on L side, reaching, picking up objects  Relieving factors: medication  PRECAUTIONS: None  WEIGHT BEARING RESTRICTIONS No  FALLS:  Has patient fallen in last 6 months? Yes. Number of falls 2 - no injury  LIVING ENVIRONMENT: Lives with: lives alone Lives in: House/apartment Stairs: No Has following equipment at home: None  OCCUPATION: Retired  PLOF: Independent and Independent with basic ADLs  PATIENT GOALS: decrease upper trap pain and improve balance  OBJECTIVE:   DIAGNOSTIC FINDINGS:  [name]  PATIENT SURVEYS:  FOTO 38% function; 51% predicted    COGNITION: Overall cognitive status: Within functional limits for tasks assessed  SENSATION: WFL  POSTURE:  rounded shoulders, forward head, and increased thoracic kyphosis  PALPATION: Significant TTP to L upper trapezius   CERVICAL ROM:   Active ROM A/PROM (deg) eval  Right rotation 55  Left rotation 50   (Blank rows = not tested)   UE MMT:   MMT Right 01/07/22   Left  01/07/22   Shoulder flexion    Shoulder abduction    Shoulder ER    Shoulder IR    Middle trapezius    Lower trapezius    Shoulder extension    Grossly 4/5 3+/5  (Blank rows = not tested)   CERVICAL SPECIAL TESTS:  Spurling's test: Negative and Distraction test: Negative   FUNCTIONAL TESTS:  [name]  TODAY'S TREATMENT:  [facility] Treatment:                                                DATE: 01/07/2022 Manual Therapy: Positional release L upper trap STM to L upper trap Trigger Point Dry Needling Treatment: Pre-treatment instruction: Patient instructed on dry needling rationale, procedures, and possible side effects including pain during treatment (achy,cramping feeling), bruising, drop of blood, lightheadedness, nausea, sweating. Patient Consent Given: Yes Education handout provided: No Muscles treated: left upper trapezius  Needle size and number: .30x20m x 3 Electrical stimulation performed: No Parameters:  [name] Treatment response/outcome: Twitch response elicited and Palpable decrease in muscle tension Post-treatment instructions: Patient instructed to expect possible mild to moderate muscle soreness later today and/or tomorrow. Patient instructed in methods to reduce muscle soreness and to continue prescribed HEP. If patient was dry needled over the lung field, patient was instructed on signs and symptoms of pneumothorax and, however unlikely, to see immediate medical attention should they occur. Patient was also educated on signs and symptoms of infection and to seek medical attention should they occur. Patient verbalized understanding of these instructions and education.  PATIENT EDUCATION:  Education details: eval findings, FOTO, HEP, POC Person educated: Patient Education method: Explanation, Demonstration, and Handouts Education comprehension: verbalized understanding and returned demonstration   HOME EXERCISE PROGRAM: [name]  ASSESSMENT:  CLINICAL IMPRESSION: Patient is a 66 y.o. F who was seen today for physical therapy evaluation and treatment for neck and left upper trapezius pain as well as reports of imbalance. Physical findings are consistent with MD impression, as pt has significant pain to palpation to L upper trap, decreased UE strength, and decreased cervical ROM. Her FOTO score indicates significant impairments in functional ability indicating she is operating well below PLOF. Pt would benefit from skilled PT services working on improving DNF and periscapular endurance as well as improving balance and safety to decrease fall risk.   OBJECTIVE IMPAIRMENTS decreased activity tolerance, decreased mobility, decreased ROM, decreased strength, postural dysfunction, and pain.   ACTIVITY LIMITATIONS carrying, lifting, bending, stairs, and transfers  PARTICIPATION LIMITATIONS: meal prep, cleaning, medication management, driving, and yard work  PERSONAL FACTORS Past/current experiences,  Time since onset of injury/illness/exacerbation, and 3+ comorbidities: DM, HTN, Fibromyalgia  are also affecting patient's functional outcome.   REHAB POTENTIAL: Good  CLINICAL DECISION MAKING: Evolving/moderate complexity  EVALUATION COMPLEXITY: Moderate   GOALS: Goals reviewed with patient? No  SHORT TERM GOALS: Target date: 01/28/2022   Pt will be compliant and knowledgeable with initial HEP for improved comfort and carryover Baseline: initial HEP given Goal status: INITIAL  2.  Pt will self report left upper trap pain no greater than 7/10 for improved comfort and functional ability Baseline: 10/10 at worst Goal status: INITIAL  LONG TERM GOALS: Target date: 03/04/2022  Pt will self report left upper trap pain no greater than 3/10 for improved comfort and functional ability Baseline: 10/10 at worst Goal status: INITIAL  2.  Pt will improve FOTO function score to no less than 51% as proxy for functional improvement Baseline: 38% function Goal status: INITIAL  3.  Pt will increase bilateral cervical rotation to no less than 65 degrees for improved comfort and function Baseline: see chart Goal status: INITIAL  4.  Pt will be able to perform floor<>chair transfer independently for improved safety and mobility Baseline: unable Goal status: INITIAL  PLAN: PT FREQUENCY: 1-2x/week  PT DURATION: 8 weeks  PLANNED INTERVENTIONS: Therapeutic exercises, Therapeutic activity, Neuromuscular re-education, Balance training, Gait training, Patient/Family education, Joint mobilization, Dry Needling, Electrical stimulation, Cryotherapy, Moist heat, Manual therapy, and Re-evaluation  PLAN FOR NEXT SESSION: assess tandem stance ability with LTG, 30 Second Sit to Stand with LTG, create HEP for DNF endurance, periscapular strengthening, and balance   Ward Chatters, PT 01/07/2022, 4:06 PM

## 2022-01-07 NOTE — Therapy (Deleted)
OUTPATIENT PHYSICAL THERAPY SHOULDER EVALUATION   Patient Name: Robin Morgan MRN: [id number] DOB:02/14/1956, 66 y.o., female Today's Date: 01/07/2022    Past Medical History:  Diagnosis Date   Calcific tendonitis    Cervical spondylosis without myelopathy    Depression    Diabetes mellitus    Esophageal stricture    Fibromyalgia    GERD (gastroesophageal reflux disease)    Hiatal hernia    Hyperlipidemia    Hypertension    Past Surgical History:  Procedure Laterality Date   ABDOMINAL HYSTERECTOMY     1986   APPENDECTOMY     1983   BREAST EXCISIONAL BIOPSY Right 1990's   BREAST SURGERY     rt breast cyst done in the 31's   [place]     right   [place]   Patient Active Problem List   Diagnosis Date Noted   Rotator cuff syndrome, left 12/11/2020   Migraine without aura, intractable, without status migrainosus 07/31/2020   Cervical disc disorder with radiculopathy of cervical region 07/31/2020   Biceps tendinitis, right 02/07/2020   Mid back pain 12/06/2019   Insomnia due to medical condition 12/01/2019   Chronic prescription opiate use 09/02/2018   Esophageal stricture    Tenosynovitis, wrist 10/07/2017   Trigger thumb of left hand 10/07/2017   Laryngopharyngeal reflux (LPR) 08/26/2017   Dysphagia 08/26/2017   Left carpal tunnel syndrome 03/08/2017   Diabetic peripheral neuropathy associated with type 2 diabetes mellitus ([place]) 01/06/2017   OSA (obstructive sleep apnea) 03/10/2016   Fibromyalgia 02/12/2016   Controlled diabetes mellitus with diabetic neuropathy, with long-term current use of insulin ([place]) 10/11/2014   Hypertension associated with diabetes ([place]) 10/11/2014   Combined hyperlipidemia associated with type 2 diabetes mellitus ([place]) 10/11/2014   DDD (degenerative disc disease), lumbar 01/15/2014   Hypothyroidism, acquired 06/12/2013   Cervical  post-laminectomy syndrome 05/10/2013   Osteoporosis 12/01/2012   Chronic diarrhea 07/04/2012   Myofascial muscle pain 10/28/2011   Depression, recurrent ([place]) 10/28/2011   Chronic pain associated with significant psychosocial dysfunction 01/03/2010    PCP: ***  REFERRING PROVIDER: ***  REFERRING DIAG: ***  THERAPY DIAG:  No diagnosis found.  Rationale for Evaluation and Treatment {HABREHAB:27488}  ONSET DATE: ***  SUBJECTIVE:                                                                                                                                                                                      SUBJECTIVE STATEMENT: ***  PERTINENT HISTORY: ***  PAIN:  Are you having pain? {OPRCPAIN:27236}  PRECAUTIONS: {Therapy precautions:24002}  WEIGHT BEARING RESTRICTIONS {Yes ***/No:24003}  FALLS:  Has patient fallen in last 6 months? {fallsyesno:27318}  LIVING ENVIRONMENT: Lives with: {OPRC lives with:25569::"lives with their family"} Lives in: {Lives in:25570} Stairs: {opstairs:27293} Has following equipment at home: {Assistive devices:23999}  OCCUPATION: ***  PLOF: {PLOF:24004}  PATIENT GOALS ***  OBJECTIVE:   DIAGNOSTIC FINDINGS:  ***  PATIENT SURVEYS:  {rehab surveys:24030:a}  COGNITION:  Overall cognitive status: {cognition:24006}     SENSATION: {sensation:27233}  POSTURE: ***  UPPER EXTREMITY ROM:   {AROM/PROM:27142} ROM Right eval Left eval  Shoulder flexion    Shoulder extension    Shoulder abduction    Shoulder adduction    Shoulder internal rotation    Shoulder external rotation    Elbow flexion    Elbow extension    Wrist flexion    Wrist extension    Wrist ulnar deviation    Wrist radial deviation    Wrist pronation    Wrist supination    (Blank rows = not tested)  UPPER EXTREMITY MMT:  MMT Right eval Left eval  Shoulder flexion    Shoulder extension    Shoulder abduction    Shoulder adduction    Shoulder  internal rotation    Shoulder external rotation    Middle trapezius    Lower trapezius    Elbow flexion    Elbow extension    Wrist flexion    Wrist extension    Wrist ulnar deviation    Wrist radial deviation    Wrist pronation    Wrist supination    Grip strength (lbs)    (Blank rows = not tested)  SHOULDER SPECIAL TESTS:  Impingement tests: {shoulder impingement test:25231:a}  SLAP lesions: {SLAP lesions:25232}  Instability tests: {shoulder instability test:25233}  Rotator cuff assessment: {rotator cuff assessment:25234}  Biceps assessment: {biceps assessment:25235}  JOINT MOBILITY TESTING:  ***  PALPATION:  ***   TODAY'S TREATMENT:  ***   PATIENT EDUCATION: Education details: *** Person educated: {Person educated:25204} Education method: {Education Method:25205} Education comprehension: {Education Comprehension:25206}   HOME EXERCISE PROGRAM: ***  ASSESSMENT:  CLINICAL IMPRESSION: Patient is a *** y.o. *** who was seen today for physical therapy evaluation and treatment for ***.    OBJECTIVE IMPAIRMENTS {opptimpairments:25111}.   ACTIVITY LIMITATIONS {activitylimitations:27494}  PARTICIPATION LIMITATIONS: {participationrestrictions:25113}  PERSONAL FACTORS {Personal factors:25162} are also affecting patient's functional outcome.   REHAB POTENTIAL: {rehabpotential:25112}  CLINICAL DECISION MAKING: {clinical decision making:25114}  EVALUATION COMPLEXITY: {Evaluation complexity:25115}   GOALS: Goals reviewed with patient? {yes/no:20286}  SHORT TERM GOALS: Target date: {follow up:25551}  (Remove Blue Hyperlink)  *** Baseline: Goal status: {GOALSTATUS:25110}  2.  *** Baseline:  Goal status: {GOALSTATUS:25110}  3.  *** Baseline:  Goal status: {GOALSTATUS:25110}  4.  *** Baseline:  Goal status: {GOALSTATUS:25110}  5.  *** Baseline:  Goal status: {GOALSTATUS:25110}  6.  *** Baseline:  Goal status: {GOALSTATUS:25110}  LONG TERM  GOALS: Target date: {follow up:25551}  (Remove Blue Hyperlink)  *** Baseline:  Goal status: {GOALSTATUS:25110}  2.  *** Baseline:  Goal status: {GOALSTATUS:25110}  3.  *** Baseline:  Goal status: {GOALSTATUS:25110}  4.  *** Baseline:  Goal status: {GOALSTATUS:25110}  5.  *** Baseline:  Goal status: {GOALSTATUS:25110}  6.  *** Baseline:  Goal status: {GOALSTATUS:25110}   PLAN: PT FREQUENCY: {rehab frequency:25116}  PT DURATION: {rehab duration:25117}  PLANNED INTERVENTIONS: {rehab planned interventions:25118::"Therapeutic exercises","Therapeutic activity","Neuromuscular re-education","Balance training","Gait training","Patient/Family education","Joint mobilization"}  PLAN FOR NEXT SESSION: ***  Ward Chatters, PT 01/07/2022, 9:14 AM

## 2022-01-15 ENCOUNTER — Ambulatory Visit: Payer: Medicare HMO

## 2022-01-19 ENCOUNTER — Telehealth: Payer: Self-pay | Admitting: Family Medicine

## 2022-01-19 NOTE — Telephone Encounter (Signed)
Copied from [place] [phone]. Topic: Medicare AWV >> Jan 19, 2022  2:22 PM Devoria Glassing wrote: Reason for CRM: Left message for patient to schedule Annual Wellness Visit.  Please schedule with Nurse Health Advisor Charlott Rakes, RN at [facility].  Please call [phone] ask for [facility]

## 2022-01-20 ENCOUNTER — Ambulatory Visit: Payer: Medicare HMO

## 2022-01-20 ENCOUNTER — Ambulatory Visit: Payer: Medicare HMO | Admitting: Family Medicine

## 2022-01-27 ENCOUNTER — Ambulatory Visit: Payer: Medicare HMO

## 2022-01-29 ENCOUNTER — Encounter: Payer: Self-pay | Admitting: Registered Nurse

## 2022-01-29 ENCOUNTER — Ambulatory Visit: Payer: Medicare HMO

## 2022-01-29 ENCOUNTER — Encounter: Payer: Medicare HMO | Attending: Physical Medicine and Rehabilitation | Admitting: Registered Nurse

## 2022-01-29 VITALS — BP 116/67 | HR 92 | Ht 61.0 in | Wt 132.0 lb

## 2022-01-29 DIAGNOSIS — G8929 Other chronic pain: Secondary | ICD-10-CM | POA: Diagnosis not present

## 2022-01-29 DIAGNOSIS — G43019 Migraine without aura, intractable, without status migrainosus: Secondary | ICD-10-CM

## 2022-01-29 DIAGNOSIS — M778 Other enthesopathies, not elsewhere classified: Secondary | ICD-10-CM | POA: Diagnosis not present

## 2022-01-29 DIAGNOSIS — M797 Fibromyalgia: Secondary | ICD-10-CM | POA: Diagnosis not present

## 2022-01-29 DIAGNOSIS — M7918 Myalgia, other site: Secondary | ICD-10-CM

## 2022-01-29 DIAGNOSIS — Z5181 Encounter for therapeutic drug level monitoring: Secondary | ICD-10-CM

## 2022-01-29 DIAGNOSIS — Z79899 Other long term (current) drug therapy: Secondary | ICD-10-CM | POA: Diagnosis not present

## 2022-01-29 DIAGNOSIS — M546 Pain in thoracic spine: Secondary | ICD-10-CM | POA: Insufficient documentation

## 2022-01-29 DIAGNOSIS — M7582 Other shoulder lesions, left shoulder: Secondary | ICD-10-CM

## 2022-01-29 DIAGNOSIS — M542 Cervicalgia: Secondary | ICD-10-CM

## 2022-01-29 DIAGNOSIS — G894 Chronic pain syndrome: Secondary | ICD-10-CM

## 2022-01-29 MED ORDER — MORPHINE SULFATE 15 MG PO TABS: 15.0000 mg | ORAL_TABLET | Freq: Three times a day (TID) | ORAL | 0 refills | Status: DC | PRN

## 2022-01-29 NOTE — Progress Notes (Signed)
Subjective:    Patient ID: Robin Morgan, female    DOB: Oct 06, 1955, 66 y.o.   MRN: [id number]  HPI: Robin Morgan is a 66 y.o. female who returns for follow up appointment for chronic pain and medication refill. She states her pain is located in her neck, left shoulder and upper - back. She rates her pain 7. Her current exercise regime is walking and performing stretching exercises.  [name] Morphine equivalent is 45.00 MME.   Last UDS was Performed on 10/08/2021, it was consistent.      Pain Inventory Average Pain 10 Pain Right Now 7 My pain is constant, stabbing, and aching  In the last 24 hours, has pain interfered with the following? General activity 7 Relation with others 7 Enjoyment of life 8 What TIME of day is your pain at its worst? evening and night Sleep (in general) Poor  Pain is worse with: bending, sitting, and standing Pain improves with: medication Relief from Meds: 8  Family History  Problem Relation Age of Onset   Alzheimer's disease Mother    Diabetes Father    Heart disease Maternal Grandmother    Breast cancer Paternal Grandmother 32   Social History   Socioeconomic History   Marital status: Divorced    Spouse name: Not on file   Number of children: 1   Years of education: Not on file   Highest education level: Not on file  Occupational History   Occupation: disabled  Tobacco Use   Smoking status: Never   Smokeless tobacco: Never  Vaping Use   Vaping Use: Never used  Substance and Sexual Activity   Alcohol use: No   Drug use: No   Sexual activity: Not Currently  Other Topics Concern   Not on file  Social History Narrative   Not on file   Social Determinants of Health   Financial Resource Strain: Low Risk  (12/06/2020)   Overall Financial Resource Strain (CARDIA)    Difficulty of Paying Living Expenses: Not hard at all  Food Insecurity: No Food Insecurity (12/06/2020)   Hunger Vital Sign    Worried About Running Out of Food  in the Last Year: Never true    Ran Out of Food in the Last Year: Never true  Transportation Needs: No Transportation Needs (12/06/2020)   PRAPARE - Hydrologist (Medical): No    Lack of Transportation (Non-Medical): No  Physical Activity: Inactive (12/06/2020)   Exercise Vital Sign    Days of Exercise per Week: 0 days    Minutes of Exercise per Session: 0 min  Stress: No Stress Concern Present (12/06/2020)   [place]    Feeling of Stress : Only a little  Social Connections: Socially Isolated (12/06/2020)   Social Connection and Isolation Panel [NHANES]    Frequency of Communication with Friends and Family: Twice a week    Frequency of Social Gatherings with Friends and Family: Once a week    Attends Religious Services: Never    Marine scientist or Organizations: No    Attends Archivist Meetings: Never    Marital Status: Divorced   Past Surgical History:  Procedure Laterality Date   [place] EXCISIONAL BIOPSY Right 1990's   BREAST SURGERY     rt breast cyst done in the 90's  CHOLECYSTECTOMY     GALLBLADDER SURGERY     1983   ROTATOR CUFF REPAIR     right   SPINE SURGERY     TONSILLECTOMY     1973   Past Surgical History:  Procedure Laterality Date   ABDOMINAL HYSTERECTOMY     1986   APPENDECTOMY     1983   BREAST EXCISIONAL BIOPSY Right 1990's   BREAST SURGERY     rt breast cyst done in the 90's   [place]     right   SPINE SURGERY     TONSILLECTOMY     1973   Past Medical History:  Diagnosis Date   Calcific tendonitis    Cervical spondylosis without myelopathy    Depression    Diabetes mellitus    Esophageal stricture    Fibromyalgia    GERD (gastroesophageal reflux disease)    Hiatal hernia    Hyperlipidemia     Hypertension    BP 116/67   Pulse 92   Ht '5\' 1"'$  (1.549 m)   Wt 132 lb (59.9 kg)   SpO2 96%   BMI 24.94 kg/m   Opioid Risk Score:   Fall Risk Score:  `1  Depression screen PHQ 2/9     12/08/2021    1:45 PM 10/08/2021    2:27 PM 09/01/2021    2:09 PM 07/02/2021    2:03 PM 04/23/2021    1:32 PM 12/06/2020    2:11 PM 09/20/2020   11:51 AM  Depression screen PHQ 2/9  Decreased Interest 0 0 '2 1 1 '$ 0 3  Down, Depressed, Hopeless 0 1 0 '1 1 1 2  '$ PHQ - 2 Score 0 '1 2 2 2 1 5  '$ Altered sleeping   0    3  Tired, decreased energy   2    3  Change in appetite   0    3  Feeling bad or failure about yourself    0    0  Trouble concentrating   0    0  Moving slowly or fidgety/restless   0    0  Suicidal thoughts   0    0  PHQ-9 Score   4    14  Difficult doing work/chores   Somewhat difficult    Somewhat difficult    Review of Systems  Musculoskeletal:  Positive for back pain and neck pain.       PAIN IN AT THE RIGHT INNER KNEE, BUTTOCKS, BOTH SHOULDERS & LEFT ARM  All other systems reviewed and are negative.      Objective:   Physical Exam Vitals and nursing note reviewed.  Constitutional:      Appearance: Normal appearance.  Cardiovascular:     Rate and Rhythm: Normal rate and regular rhythm.     Pulses: Normal pulses.     Heart sounds: Normal heart sounds.  Pulmonary:     Effort: Pulmonary effort is normal.     Breath sounds: Normal breath sounds.  Musculoskeletal:     Cervical back: Normal range of motion and neck supple.     Comments: Normal Muscle Bulk and Muscle Testing Reveals:  Upper Extremities: Full ROM and Muscle Strength 5/5 Left AC Joint Tenderness Thoracic Paraspinal Tenderness: T-1-T-4 Mainly Left Side  Lower Extremities: Full ROM and Muscle Strength 5/5 Arises from Table with Ease Narrow Based  Gait  Skin:    General: Skin is warm and dry.  Neurological:     Mental Status: She is alert and oriented to person, place, and time.  Psychiatric:        Mood  and Affect: Mood normal.        Behavior: Behavior normal.         Assessment & Plan:  1. Fibromyalgia with myofascial pain: Continue home exercise program and heat therapy. Continue current medication regimen with Flexeril. 01/29/2022.  2. Rotator cuff syndrome on the right: Continue with stretching exercises and heat therapy. 01/29/2022 3. Cervicalgia. Post-laminectomy syndrome, facet arthropathy: Cervical Radiculopathy: Continue current medication regimen with Gabapentin. 01/29/2022  S/ P  EMG with Dr. Naaman Plummer on 03/08/2017: Diagnosed with Moderate to Severe CTS of Left wrist: 01/29/2022. 4. Depression: Continue current medication regimen with Cymbalta. PCP Following. Current  dose 90 mg daily. 12/08/2021 5. Mid/low back pain/ Lumbar Spondylosis: Continue Current Medication and stretching and heat therapy. 01/29/2022 Refilled: Morphine Sulfate IR 15 mg one tablet three times  a day as needed for pain # 80.  6. Lumbar Radiculopathy:  Continue current medication regimen with Gabapentin. 01/29/2022 7. OA of right hand: Continue Voltaren gel/ May substitute with Voltaren Tablet, she realizes she can't use both and verbalizes understanding. Continue with  heat therapy. 01/29/2022 8. Migraines: Continue with headache Journal.  Continue to Monitor  01/29/2022. 9. Left CTS:S/P EMG on 03/08/2017: S/P Carpal Tunnel Release on 06/14/2017 via . Dr. Apolonio Schneiders. 01/29/2022 10.. Muscle Spasm: Continue Tizanidine. Continue to Monitor. 01/29/2022 11. Left Shoulder Pain: Ortho Following: Continue HEP as Tolerated. Continue to Monitor. 01/29/2022  12. Fall at Home: No falls since last visit. Continue to Monitor. 01/29/2022      F/U in 1 month

## 2022-02-03 ENCOUNTER — Ambulatory Visit: Payer: Medicare HMO

## 2022-02-05 ENCOUNTER — Ambulatory Visit: Payer: Medicare HMO

## 2022-02-16 ENCOUNTER — Other Ambulatory Visit: Payer: Self-pay | Admitting: Family Medicine

## 2022-02-16 ENCOUNTER — Encounter: Payer: Medicare HMO | Admitting: Psychology

## 2022-02-23 ENCOUNTER — Telehealth: Payer: Self-pay | Admitting: Family Medicine

## 2022-02-23 NOTE — Telephone Encounter (Signed)
Copied from [place] [id number]. Topic: Medicare AWV >> Feb 23, 2022  2:50 PM Devoria Glassing wrote: Reason for CRM: Left message for patient to schedule Annual Wellness Visit.  Please schedule with Nurse Health Advisor Charlott Rakes, RN at [facility]. This appt can be telephone or office visit. Please call [phone] ask for [facility]

## 2022-03-17 ENCOUNTER — Other Ambulatory Visit: Payer: Self-pay | Admitting: Physical Medicine & Rehabilitation

## 2022-03-17 ENCOUNTER — Other Ambulatory Visit: Payer: Self-pay | Admitting: Registered Nurse

## 2022-03-17 DIAGNOSIS — M797 Fibromyalgia: Secondary | ICD-10-CM

## 2022-03-17 DIAGNOSIS — M7918 Myalgia, other site: Secondary | ICD-10-CM

## 2022-03-23 ENCOUNTER — Encounter: Payer: Medicare HMO | Attending: Physical Medicine and Rehabilitation | Admitting: Psychology

## 2022-03-23 DIAGNOSIS — M5136 Other intervertebral disc degeneration, lumbar region: Secondary | ICD-10-CM | POA: Insufficient documentation

## 2022-03-23 DIAGNOSIS — G5792 Unspecified mononeuropathy of left lower limb: Secondary | ICD-10-CM | POA: Diagnosis not present

## 2022-03-23 DIAGNOSIS — G8929 Other chronic pain: Secondary | ICD-10-CM | POA: Diagnosis not present

## 2022-03-23 DIAGNOSIS — M778 Other enthesopathies, not elsewhere classified: Secondary | ICD-10-CM | POA: Insufficient documentation

## 2022-03-23 DIAGNOSIS — G894 Chronic pain syndrome: Secondary | ICD-10-CM | POA: Diagnosis not present

## 2022-03-23 DIAGNOSIS — M25551 Pain in right hip: Secondary | ICD-10-CM | POA: Insufficient documentation

## 2022-03-23 DIAGNOSIS — F339 Major depressive disorder, recurrent, unspecified: Secondary | ICD-10-CM | POA: Diagnosis not present

## 2022-03-23 DIAGNOSIS — G43019 Migraine without aura, intractable, without status migrainosus: Secondary | ICD-10-CM | POA: Diagnosis not present

## 2022-03-23 DIAGNOSIS — M5010 Cervical disc disorder with radiculopathy, unspecified cervical region: Secondary | ICD-10-CM

## 2022-03-23 DIAGNOSIS — M7918 Myalgia, other site: Secondary | ICD-10-CM | POA: Insufficient documentation

## 2022-03-23 DIAGNOSIS — R69 Illness, unspecified: Secondary | ICD-10-CM | POA: Diagnosis not present

## 2022-03-23 DIAGNOSIS — Z79899 Other long term (current) drug therapy: Secondary | ICD-10-CM | POA: Insufficient documentation

## 2022-03-23 DIAGNOSIS — Z5181 Encounter for therapeutic drug level monitoring: Secondary | ICD-10-CM | POA: Insufficient documentation

## 2022-03-23 DIAGNOSIS — M5412 Radiculopathy, cervical region: Secondary | ICD-10-CM | POA: Insufficient documentation

## 2022-03-23 DIAGNOSIS — M542 Cervicalgia: Secondary | ICD-10-CM | POA: Diagnosis not present

## 2022-03-23 DIAGNOSIS — M546 Pain in thoracic spine: Secondary | ICD-10-CM | POA: Diagnosis not present

## 2022-03-23 DIAGNOSIS — M25512 Pain in left shoulder: Secondary | ICD-10-CM | POA: Diagnosis not present

## 2022-03-23 DIAGNOSIS — M797 Fibromyalgia: Secondary | ICD-10-CM | POA: Insufficient documentation

## 2022-03-24 ENCOUNTER — Encounter: Payer: Self-pay | Admitting: Psychology

## 2022-03-24 NOTE — Progress Notes (Signed)
Neuropsychology Visit  Patient:  Robin Morgan   DOB: [birth year]  MR Number: [id number]  Location: [facility] [place], [place] [id number] [place] [id number] Dept: [phone]  Date of Service: 03/23/2022  Start: 2 PM End: 3 PM  Duration of Service: 1 Hour  Today's visit was conducted in my outpatient clinic office with the patient myself present.  Today's visit was an in person visit.  Provider/Observer:     Edgardo Roys PsyD  Chief Complaint:      Chief Complaint  Patient presents with   Back Pain   Neck Pain   Pain   Depression   Anxiety    Reason For Service:     Robin Morgan is a 66 year old right-handed female referred by Dr. Naaman Plummer for neuropsychological/psychological consultation due to significant depression and anxiety with primary stressors related to family issues in the setting of fibromyalgia and chronic severe pain symptoms.  The patient reports that she has had significant pain in her back primarily mid back for 20 years or more.  The patient is also been diagnosed with fibromyalgia.  The patient had surgery on her neck to try to alleviate help with her severe recurrent headaches but experience little change post surgery.  The patient also has significant shoulder pain and severe headache with these pain and headaches resulting in her having to spend multiple days at a time essentially in bed.  The patient is continued to struggle with anxiety and depressive symptoms on top of her chronic pain disorder.  The patient was asked to help take care of her father who has dementia and her sisters expected her to stay there for an extended period of time.  The patient was there for almost a week experiencing increasing problems due to poor sleeping conditions and an exacerbation of her underlying chronic pain symptoms.  There continues to be stressors  and animosity between the patient and her siblings.  There continues to be a lot of stress around what is happening with the care of her father and the relationship dynamics between the patient and her 2 sisters.  The patient herself has significant pain and is unable to physically help much with her father but particularly her younger sister is demanding and manipulative of the patient to try to be able to do more.  The patient's description of the younger sister present a picture of a very manipulative narcissistic like behavioral pattern in the younger sister and it is very difficult for her and the 2 other sisters to engage and have the younger sister react appropriately.  Treatment Interventions:  Therapeutic interventions for issues related to chronic pain/fibromyalgia and recurrent headaches and depression anxiety symptoms with significant psychosocial stressors.  Participation Level:   Active  Participation Quality:  Appropriate and Attentive      Behavioral Observation:  Well Groomed, Alert, and Appropriate.   Current Psychosocial Factors: The patient continues to have a lot of stress around her father's deteriorating health and one of her sisters that likely has significant personality disorder that causes a great deal of stress within the family structure.  The patient is doing as much as she can to help with her father but her physical limitations play significant negative role in her capacity.  Content of Session:   Reviewed current symptoms and continue to develop coping skills around her chronic pain and fibromyalgia symptoms as well as headaches  and dealing with cervical postlaminectomy syndrome.  We also worked on significant psychosocial stressors that are being impacted by the patient's limited physical functioning as well as her stressors having a significant deleterious on her overall pain symptoms.  Effectiveness of Interventions: The patient has been quite open and active in  these therapeutic processes and rapport been easily established.  The patient's cognitive function is good and while stress affects her ability to stay focused this is not indicative of any type of cognitive deficits with the patient.  The patient is already working on some of the initial issues we have focused around sleep hygiene, physical activity and other coping issues around her chronic pain symptoms.  Target Goals:   Goals include building better coping and adaptive skills around her chronic pain symptoms and working on better pain management as well as working on significant psychosocial stressors that are negatively impacting her current medical issues.  Goals Last Reviewed:   09/15/2021  Goals Addressed Today:    Today we worked a lot on the various stressors in her life that are exacerbating her chronic pain symptoms.  Impression/Diagnosis:   Robin Morgan is a 66 year old right-handed female referred by Dr. Naaman Plummer for neuropsychological/psychological consultation due to significant depression and anxiety with primary stressors related to family issues in the setting of fibromyalgia and chronic severe pain symptoms.  The patient reports that she has had significant pain in her back primarily mid back for 20 years or more.  The patient is also been diagnosed with fibromyalgia.  The patient had surgery on her neck to try to alleviate help with her severe recurrent headaches but experience little change post surgery.  The patient also has significant shoulder pain and severe headache with these pain and headaches resulting in her having to spend multiple days at a time essentially in bed.   I do think that the patient's anxiety, stress and depressive symptoms do play and exacerbating role in her overall pain symptoms and while she clearly has abnormalities in lumbar and cervical regions as a primary factor for her pain symptoms her stress responses, fibromyalgia and depression do play a role in  the acute day-to-day level of her pain.  Today we have continue to work on therapeutic interventions around chronic pain and building better coping strategies for the numerous stressors that she is facing particularly around issues with her family and the impact that stress has on her chronic pain difficulties.  Diagnosis:   Chronic pain syndrome  Cervical disc disorder with radiculopathy of cervical region  Depression, recurrent (HCC)    Ilean Skill, Psy.D. Clinical Psychologist Neuropsychologist

## 2022-04-01 ENCOUNTER — Other Ambulatory Visit: Payer: Self-pay | Admitting: Family Medicine

## 2022-04-02 ENCOUNTER — Encounter: Payer: Medicare HMO | Admitting: Registered Nurse

## 2022-04-02 ENCOUNTER — Ambulatory Visit: Payer: Medicare HMO | Admitting: Registered Nurse

## 2022-04-03 ENCOUNTER — Ambulatory Visit
Admission: RE | Admit: 2022-04-03 | Discharge: 2022-04-03 | Disposition: A | Discharge status: Home / Self Care | Payer: Medicare HMO | Source: Ambulatory Visit | Attending: Registered Nurse | Admitting: Registered Nurse

## 2022-04-03 ENCOUNTER — Encounter: Payer: Self-pay | Admitting: Registered Nurse

## 2022-04-03 ENCOUNTER — Encounter (HOSPITAL_BASED_OUTPATIENT_CLINIC_OR_DEPARTMENT_OTHER): Payer: Medicare HMO | Admitting: Registered Nurse

## 2022-04-03 VITALS — BP 102/63 | HR 92 | Ht 61.0 in | Wt 136.0 lb

## 2022-04-03 DIAGNOSIS — M542 Cervicalgia: Secondary | ICD-10-CM

## 2022-04-03 DIAGNOSIS — G894 Chronic pain syndrome: Secondary | ICD-10-CM | POA: Diagnosis not present

## 2022-04-03 DIAGNOSIS — M5010 Cervical disc disorder with radiculopathy, unspecified cervical region: Secondary | ICD-10-CM | POA: Diagnosis not present

## 2022-04-03 DIAGNOSIS — Z5181 Encounter for therapeutic drug level monitoring: Secondary | ICD-10-CM

## 2022-04-03 DIAGNOSIS — M546 Pain in thoracic spine: Secondary | ICD-10-CM

## 2022-04-03 DIAGNOSIS — M5136 Other intervertebral disc degeneration, lumbar region: Secondary | ICD-10-CM | POA: Diagnosis not present

## 2022-04-03 DIAGNOSIS — M25551 Pain in right hip: Secondary | ICD-10-CM

## 2022-04-03 DIAGNOSIS — M7918 Myalgia, other site: Secondary | ICD-10-CM

## 2022-04-03 DIAGNOSIS — M5412 Radiculopathy, cervical region: Secondary | ICD-10-CM | POA: Diagnosis not present

## 2022-04-03 DIAGNOSIS — M25512 Pain in left shoulder: Secondary | ICD-10-CM

## 2022-04-03 DIAGNOSIS — M797 Fibromyalgia: Secondary | ICD-10-CM

## 2022-04-03 DIAGNOSIS — G5792 Unspecified mononeuropathy of left lower limb: Secondary | ICD-10-CM

## 2022-04-03 DIAGNOSIS — Z79899 Other long term (current) drug therapy: Secondary | ICD-10-CM | POA: Diagnosis not present

## 2022-04-03 DIAGNOSIS — G43019 Migraine without aura, intractable, without status migrainosus: Secondary | ICD-10-CM

## 2022-04-03 DIAGNOSIS — M778 Other enthesopathies, not elsewhere classified: Secondary | ICD-10-CM

## 2022-04-03 DIAGNOSIS — R69 Illness, unspecified: Secondary | ICD-10-CM | POA: Diagnosis not present

## 2022-04-03 DIAGNOSIS — G8929 Other chronic pain: Secondary | ICD-10-CM | POA: Diagnosis not present

## 2022-04-03 MED ORDER — MORPHINE SULFATE 15 MG PO TABS
15.0000 mg | ORAL_TABLET | Freq: Three times a day (TID) | ORAL | 0 refills | Status: DC | PRN
Start: 2022-04-03 — End: 2022-04-03

## 2022-04-03 MED ORDER — MORPHINE SULFATE 15 MG PO TABS
15.0000 mg | ORAL_TABLET | Freq: Three times a day (TID) | ORAL | 0 refills | Status: DC | PRN
Start: 2022-04-03 — End: 2022-07-15

## 2022-04-03 NOTE — Progress Notes (Signed)
Subjective:    Patient ID: Robin Morgan, female    DOB: 21-Jun-1956, 66 y.o.   MRN: [id number]  HPI: Robin Morgan is a 66 y.o. female who returns for follow up appointment for chronic pain and medication refill. She states her pain is located in her neck radiatinting into her left shoulder, also reports increase intensity and frequency of left shoulder pain. X-ray ordered, she denies falling.  Also reports upper-lower back pain, right hip pain, denies falling, X-ray ordered. Robin Morgan states she has left foot pain with numbness and tingling, especially with walking while wearing enclosed shoes. .She  rates her pain 8. Her current exercise regime is walking and performing stretching exercises.   Robin Morgan Morphine equivalent is 38.52   MME.   UDS ordered today.   Pain Inventory Average Pain 8 Pain Right Now 8 My pain is constant, stabbing, and aching  In the last 24 hours, has pain interfered with the following? General activity 8 Relation with others 9 Enjoyment of life 9 What TIME of day is your pain at its worst? varies Sleep (in general) Poor  Pain is worse with: sitting and standing Pain improves with: rest and medication Relief from Meds: 7  Family History  Problem Relation Age of Onset   Alzheimer's disease Mother    Diabetes Father    Heart disease Maternal Grandmother    Breast cancer Paternal Grandmother 58   Social History   Socioeconomic History   Marital status: Divorced    Spouse name: Not on file   Number of children: 1   Years of education: Not on file   Highest education level: Not on file  Occupational History   Occupation: disabled  Tobacco Use   Smoking status: Never   Smokeless tobacco: Never  Vaping Use   Vaping Use: Never used  Substance and Sexual Activity   Alcohol use: No   Drug use: No   Sexual activity: Not Currently  Other Topics Concern   Not on file  Social History Narrative   Not on file   Social Determinants of Health    Financial Resource Strain: Low Risk  (12/06/2020)   Overall Financial Resource Strain (CARDIA)    Difficulty of Paying Living Expenses: Not hard at all  Food Insecurity: No Food Insecurity (12/06/2020)   Hunger Vital Sign    Worried About Running Out of Food in the Last Year: Never true    Ran Out of Food in the Last Year: Never true  Transportation Needs: No Transportation Needs (12/06/2020)   PRAPARE - Hydrologist (Medical): No    Lack of Transportation (Non-Medical): No  Physical Activity: Inactive (12/06/2020)   Exercise Vital Sign    Days of Exercise per Week: 0 days    Minutes of Exercise per Session: 0 min  Stress: No Stress Concern Present (12/06/2020)   [place]    Feeling of Stress : Only a little  Social Connections: Socially Isolated (12/06/2020)   Social Connection and Isolation Panel [NHANES]    Frequency of Communication with Friends and Family: Twice a week    Frequency of Social Gatherings with Friends and Family: Once a week    Attends Religious Services: Never    Marine scientist or Organizations: No    Attends Archivist Meetings: Never    Marital Status: Divorced   Past Surgical History:  Procedure Laterality Date  ABDOMINAL HYSTERECTOMY     1986   APPENDECTOMY     1983   BREAST EXCISIONAL BIOPSY Right 1990's   BREAST SURGERY     rt breast cyst done in the 90's   [place]     right   [place]   Past Surgical History:  Procedure Laterality Date   ABDOMINAL HYSTERECTOMY     1986   APPENDECTOMY     1983   BREAST EXCISIONAL BIOPSY Right 1990's   BREAST SURGERY     rt breast cyst done in the 90's   [place]     right   SPINE SURGERY     TONSILLECTOMY     1973   Past Medical  History:  Diagnosis Date   Calcific tendonitis    Cervical spondylosis without myelopathy    Depression    Diabetes mellitus    Esophageal stricture    Fibromyalgia    GERD (gastroesophageal reflux disease)    Hiatal hernia    Hyperlipidemia    Hypertension    Ht '5\' 1"'$  (1.549 m)   Wt 136 lb (61.7 kg)   BMI 25.70 kg/m   Opioid Risk Score:   Fall Risk Score:  `1  Depression screen PHQ 2/9     04/03/2022   12:59 PM 01/29/2022    1:31 PM 12/08/2021    1:45 PM 10/08/2021    2:27 PM 09/01/2021    2:09 PM 07/02/2021    2:03 PM 04/23/2021    1:32 PM  Depression screen PHQ 2/9  Decreased Interest 0 1 0 0 '2 1 1  '$ Down, Depressed, Hopeless 0 1 0 1 0 1 1  PHQ - 2 Score 0 2 0 '1 2 2 2  '$ Altered sleeping     0    Tired, decreased energy     2    Change in appetite     0    Feeling bad or failure about yourself      0    Trouble concentrating     0    Moving slowly or fidgety/restless     0    Suicidal thoughts     0    PHQ-9 Score     4    Difficult doing work/chores     Somewhat difficult       Review of Systems  Musculoskeletal:  Positive for back pain.       Left leg pain, Head, neck,   All other systems reviewed and are negative.     Objective:   Physical Exam Vitals and nursing note reviewed.  Constitutional:      Appearance: Normal appearance.  Cardiovascular:     Rate and Rhythm: Normal rate and regular rhythm.     Pulses: Normal pulses.     Heart sounds: Normal heart sounds.  Pulmonary:     Effort: Pulmonary effort is normal.     Breath sounds: Normal breath sounds.  Musculoskeletal:     Cervical back: Normal range of motion and neck supple.     Comments: Normal Muscle Bulk and Muscle Testing Reveals:  Upper Extremities: Decreased ROM 90 Degrees and Muscle Strength  5/5 Left AC Joint Tenderness Thoracic Hypersensitivity Lower Extremities: Full ROM and Muscle Strength  5/5 Arises from Table with ease Narrow Based  Gait     Skin:    General: Skin is warm and  dry.  Neurological:     Mental Status: She is alert and oriented to person, place, and time.  Psychiatric:        Mood and Affect: Mood normal.        Behavior: Behavior normal.         Assessment & Plan:  Left Shoulder Pain: RX: Xray. Denies Falling. Continue to Monitor Right Hi Pain: RX: Xray. Denies Falling. Continue to Monitor 3. Fibromyalgia with myofascial pain: Continue home exercise program and heat therapy. Continue current medication regimen with Flexeril. 04/03/2022. 4 . Rotator cuff syndrome on the right: Continue with stretching exercises and heat therapy. 04/03/2022 5. Cervicalgia. Post-laminectomy syndrome, facet arthropathy: Cervical Radiculopathy: Continue current medication regimen with Gabapentin. 04/03/2022  S/ P  EMG with Dr. Naaman Plummer on 03/08/2017: Diagnosed with Moderate to Severe CTS of Left wrist: 04/03/2022. 6. Depression: Continue current medication regimen with Cymbalta. PCP Following. Current  dose 90 mg daily. 04/03/2022 7. Mid/low back pain/ Lumbar Spondylosis: Continue Current Medication and stretching and heat therapy. 04/03/2022 Refilled: Morphine Sulfate IR 15 mg one tablet three times  a day as needed for pain # 80.  8. Lumbar Radiculopathy:  Continue current medication regimen with Gabapentin. 04/03/2022 9. OA of right hand: Continue Voltaren gel/ May substitute with Voltaren Tablet, she realizes she can't use both and verbalizes understanding. Continue with  heat therapy. 01/29/2022 10.. Migraines: Continue with headache Journal.  Continue to Monitor  04/03/2022. 11. Left CTS:S/P EMG on 03/08/2017: S/P Carpal Tunnel Release on 06/14/2017 via . Dr. Apolonio Schneiders. 04/03/2022 12.. Muscle Spasm: Continue Tizanidine. Continue to Monitor. 04/03/2022 13.. Left Shoulder Pain: Ortho Following: Continue HEP as Tolerated. Continue to Monitor. 04/03/2022  14. Left Foot Neuropathy: Continue Gabapentin. Continue to Monitor      F/U in 1 month

## 2022-04-06 ENCOUNTER — Telehealth: Payer: Self-pay | Admitting: Registered Nurse

## 2022-04-06 NOTE — Telephone Encounter (Signed)
Call was placed to Ms. Sons, she will be scheduled with Dr Naaman Plummer next month. She verbalizes understanding.

## 2022-04-06 NOTE — Telephone Encounter (Signed)
I need to see her in person first before ordering an MRI or therapy. Let's see when we can get her in.  Thanks!

## 2022-04-06 NOTE — Telephone Encounter (Signed)
X-Ray Results was Reviewed with [name].  She states she is having increase intensity of pain in her left shoulder radiating into her elbow. She also states she has pain when she is putting on her bra, with no relief.  We discussed physical therapy, she would like to have a MRI. This will be discussed with Dr Naaman Plummer, she verbalizes understanding.  Will forward message to Dr Naaman Plummer for his input, she verbalizes understanding.

## 2022-04-08 LAB — TOXASSURE SELECT,+ANTIDEPR,UR

## 2022-04-10 ENCOUNTER — Telehealth: Payer: Self-pay | Admitting: *Deleted

## 2022-04-10 NOTE — Telephone Encounter (Signed)
Urine drug screen for this encounter is consistent for prescribed medication 

## 2022-04-28 ENCOUNTER — Telehealth: Payer: Self-pay | Admitting: Pharmacist

## 2022-04-28 NOTE — Progress Notes (Signed)
Chronic Care Management Pharmacy Assistant   Name: Robin Morgan  MRN: [id number] DOB: 07/14/1956   Reason for Encounter: General Adherence Call    Recent office visits:  09/01/2021 OV (PCP) Leamon Arnt, MD; no medication changes.  Recent consult visits:  10/08/2021 OV (Phys Med) Meredith Staggers, MD; no medication changes.  Hospital visits:  None in previous 6 months  Medications: Outpatient Encounter Medications as of 04/28/2022  Medication Sig   atorvastatin (LIPITOR) 40 MG tablet Take 1 tablet (40 mg total) by mouth daily.   b complex vitamins tablet Take 1 tablet by mouth daily.   Calcium Carbonate-Vitamin D (CALCIUM-VITAMIN D) 500-200 MG-UNIT per tablet Take 1 tablet by mouth 2 (two) times daily with a meal.   Cholecalciferol (VITAMIN D3) 50 MCG (2000 UT) CAPS Take 2 capsules by mouth daily.   co-enzyme Q-10 30 MG capsule Take 30 mg by mouth at bedtime.    cyclobenzaprine (FLEXERIL) 10 MG tablet one tablet   dapagliflozin propanediol (FARXIGA) 10 MG TABS tablet Take 1 tablet (10 mg total) by mouth daily.   diclofenac Sodium (VOLTAREN) 1 % GEL    DULoxetine (CYMBALTA) 30 MG capsule TAKE 3 CAPSULES BY MOUTH DAILY   esomeprazole (NEXIUM) 40 MG capsule TAKE 1 CAPSULE BY MOUTH 2 TIMES DAILY BEFORE A MEAL. MORNING AND EVENING   fenofibrate 160 MG tablet TAKE 1 TABLET BY MOUTH ONCE A DAY   gabapentin (NEURONTIN) 300 MG capsule TAKE 2 CAPSULES BY MOUTH IN THE MORNING,2 CAPSULES AT LUNCH AND 2 CAPSULES AT BEDTIME   Insulin Glargine (BASAGLAR KWIKPEN) 100 UNIT/ML INJECT 35 UNITS INTO THE SKIN 2 TIMES DAILY   levothyroxine (SYNTHROID) 88 MCG tablet TAKE 1 TAB BY MOUTH ONCE DAILY. TAKE ON AN EMPTY STOMACH WITH A GLASS OF WATER ATLEAST 30-60 MINUTES BEFORE BREAKFAST   lisinopril (ZESTRIL) 5 MG tablet Take 1 tablet (5 mg total) by mouth daily.   magnesium gluconate (MAGONATE) 500 MG tablet Take 1 tablet (500 mg total) by mouth daily.   meloxicam (MOBIC) 7.5 MG tablet 1 tablet    metFORMIN (GLUCOPHAGE) 1000 MG tablet TAKE 1 TABLET BY MOUTH TWICE A DAY WITH MEALS   morphine (MSIR) 15 MG tablet Take 1 tablet (15 mg total) by mouth 3 (three) times daily as needed for moderate pain. Do Not Fill Before 05/20/2022   MOVANTIK 25 MG TABS tablet TAKE 1 TABLET BY MOUTH ONCE DAILY   Omega-3 Fatty Acids (FISH OIL) 1000 MG CAPS 1 capsule   OPTIVE 0.5-0.9 % ophthalmic solution Place 1 drop into both eyes as needed.    RESTASIS 0.05 % ophthalmic emulsion Place 1 drop into both eyes daily.    sucralfate (CARAFATE) 1 GM/10ML suspension SMARTSIG:Milliliter(s) By Mouth   tiZANidine (ZANAFLEX) 2 MG tablet TAKE 1/2 TO 1 TABLET (1-2 MG TOTAL) BY MOUTH EVERY 6 HOURS AS NEEDED FOR MUSCLE SPASMS   topiramate (TOPAMAX) 50 MG tablet TAKE 2 TABLETS BY MOUTH AT BEDTIME   TRIAMCINOLONE & EMOLLIENT EX  (Patient not taking: Reported on 04/03/2022)   vitamin B-12 (CYANOCOBALAMIN) 1000 MCG tablet Take 1,000 mcg by mouth daily.   Vitamin D, Ergocalciferol, 50000 units CAPS    zolpidem (AMBIEN) 10 MG tablet Take 1 tablet (10 mg total) by mouth at bedtime as needed. for sleep   No facility-administered encounter medications on file as of 04/28/2022.   Patient has not completed initial visit with [facility]  **Unsuccessful attempt at reaching patient to complete this call.**  Care  Gaps: Medicare Annual Wellness: Completed Ophthalmology Exam: Due since 04/22/2022 Foot Exam: Next due on 09/01/2022 Hemoglobin A1C: 6.4% on 12/03/2021 Colonoscopy: Next due on 11/19/2025 Dexa Scan: Next due on 01/16/2023 Mammogram: Overdue since 06/19/2021  Future Appointments  Date Time Provider [place]  04/29/2022  1:40 PM Meredith Staggers, MD [facility] CPR  06/08/2022  1:00 PM Bayard Hugger, NP [facility]  09/01/2022  8:00 AM Edgardo Roys, PsyD [facility] CPR   Star Rating Drugs: Atorvastatin 40 mg last filled 01/16/2022 100 DS Farxiga 10 mg last filled 03/11/2022 90 DS Basaglar last filled  12/09/2021 86 DS Lisinopril 5 mg last filled 02/17/2022 100 DS Metformin 1000 mg last filled 03/17/2022 90 DS  April D Calhoun, [place] Pharmacist Assistant [phone]

## 2022-04-29 ENCOUNTER — Encounter: Payer: Self-pay | Admitting: Physical Medicine & Rehabilitation

## 2022-04-29 ENCOUNTER — Encounter: Payer: Medicare HMO | Attending: Physical Medicine and Rehabilitation | Admitting: Physical Medicine & Rehabilitation

## 2022-04-29 VITALS — BP 116/69 | HR 100 | Ht 61.0 in | Wt 135.4 lb

## 2022-04-29 DIAGNOSIS — E1142 Type 2 diabetes mellitus with diabetic polyneuropathy: Secondary | ICD-10-CM | POA: Diagnosis not present

## 2022-04-29 DIAGNOSIS — M75102 Unspecified rotator cuff tear or rupture of left shoulder, not specified as traumatic: Secondary | ICD-10-CM | POA: Insufficient documentation

## 2022-04-29 DIAGNOSIS — M797 Fibromyalgia: Secondary | ICD-10-CM | POA: Diagnosis not present

## 2022-04-29 DIAGNOSIS — M7918 Myalgia, other site: Secondary | ICD-10-CM | POA: Diagnosis not present

## 2022-04-29 DIAGNOSIS — M5010 Cervical disc disorder with radiculopathy, unspecified cervical region: Secondary | ICD-10-CM | POA: Insufficient documentation

## 2022-04-29 MED ORDER — LIDOCAINE HCL 1 % IJ SOLN
8.0000 mL | Freq: Once | INTRAMUSCULAR | Status: AC
  Administered 2022-04-29: 8 mL

## 2022-04-29 MED ORDER — BETAMETHASONE SOD PHOS & ACET 6 (3-3) MG/ML IJ SUSP
12.0000 mg | Freq: Once | INTRAMUSCULAR | Status: AC
  Administered 2022-04-29: 12 mg via INTRAMUSCULAR

## 2022-04-29 NOTE — Patient Instructions (Addendum)
PLEASE FEEL FREE TO CALL OUR OFFICE WITH ANY PROBLEMS OR QUESTIONS ([phone])   HEAD UP AND BACK AS OPPOSED TO DOWN AND FORWARD.   WORK ON NECK AND HEAD AS WELL AS LEFT SHOULDER RANGE OF MOTION ALSO.

## 2022-04-29 NOTE — Progress Notes (Signed)
Subjective:    Patient ID: Robin Morgan, female    DOB: Jan 20, 1956, 66 y.o.   MRN: [id number]  HPI  Robin Morgan is back regarding her chronic pain. She had a fall over the summer and has continued to have pain along the left upper shoulder and neck. She fell backwards hitting her upper neck and shoulder girdle. She is struggling with pain in the left shoulder with radiation down side of upper arm. Left trap is very tight and painful as well. She finds it hard to sleep as well.   She remains on pain medications as prescribed including MS IR prn   Pain Inventory Average Pain 9 Pain Right Now 8 My pain is constant, stabbing, and aching  In the last 24 hours, has pain interfered with the following? General activity 9 Relation with others 7 Enjoyment of life 7 What TIME of day is your pain at its worst? morning , evening, and night Sleep (in general) Poor  Pain is worse with: walking, sitting, and standing Pain improves with: rest and medication Relief from Meds: 7  Family History  Problem Relation Age of Onset   Alzheimer's disease Mother    Diabetes Father    Heart disease Maternal Grandmother    Breast cancer Paternal Grandmother 64   Social History   Socioeconomic History   Marital status: Divorced    Spouse name: Not on file   Number of children: 1   Years of education: Not on file   Highest education level: Not on file  Occupational History   Occupation: disabled  Tobacco Use   Smoking status: Never   Smokeless tobacco: Never  Vaping Use   Vaping Use: Never used  Substance and Sexual Activity   Alcohol use: No   Drug use: No   Sexual activity: Not Currently  Other Topics Concern   Not on file  Social History Narrative   Not on file   Social Determinants of Health   Financial Resource Strain: Low Risk  (12/06/2020)   Overall Financial Resource Strain (CARDIA)    Difficulty of Paying Living Expenses: Not hard at all  Food Insecurity: No Food Insecurity  (12/06/2020)   Hunger Vital Sign    Worried About Running Out of Food in the Last Year: Never true    Ran Out of Food in the Last Year: Never true  Transportation Needs: No Transportation Needs (12/06/2020)   PRAPARE - Hydrologist (Medical): No    Lack of Transportation (Non-Medical): No  Physical Activity: Inactive (12/06/2020)   Exercise Vital Sign    Days of Exercise per Week: 0 days    Minutes of Exercise per Session: 0 min  Stress: No Stress Concern Present (12/06/2020)   [place]    Feeling of Stress : Only a little  Social Connections: Socially Isolated (12/06/2020)   Social Connection and Isolation Panel [NHANES]    Frequency of Communication with Friends and Family: Twice a week    Frequency of Social Gatherings with Friends and Family: Once a week    Attends Religious Services: Never    Marine scientist or Organizations: No    Attends Archivist Meetings: Never    Marital Status: Divorced   Past Surgical History:  Procedure Laterality Date   [place] EXCISIONAL BIOPSY Right 1990's  BREAST SURGERY     rt breast cyst done in the 90's   [place]     right   [place]   Past Surgical History:  Procedure Laterality Date   ABDOMINAL HYSTERECTOMY     1986   APPENDECTOMY     1983   BREAST EXCISIONAL BIOPSY Right 1990's   BREAST SURGERY     rt breast cyst done in the 90's   [place]     right   SPINE SURGERY     TONSILLECTOMY     1973   Past Medical History:  Diagnosis Date   Calcific tendonitis    Cervical spondylosis without myelopathy    Depression    Diabetes mellitus    Esophageal stricture    Fibromyalgia    GERD  (gastroesophageal reflux disease)    Hiatal hernia    Hyperlipidemia    Hypertension    BP 116/69   Pulse 100   Ht '5\' 1"'$  (1.549 m)   Wt 135 lb 6.4 oz (61.4 kg)   SpO2 95%   BMI 25.58 kg/m   Opioid Risk Score:   Fall Risk Score:  `1  Depression screen [facility] 2/9     04/29/2022    1:03 PM 04/03/2022   12:59 PM 01/29/2022    1:31 PM 12/08/2021    1:45 PM 10/08/2021    2:27 PM 09/01/2021    2:09 PM 07/02/2021    2:03 PM  Depression screen PHQ 2/9  Decreased Interest 1 0 1 0 0 2 1  Down, Depressed, Hopeless 1 0 1 0 1 0 1  PHQ - 2 Score 2 0 2 0 '1 2 2  '$ Altered sleeping      0   Tired, decreased energy      2   Change in appetite      0   Feeling bad or failure about yourself       0   Trouble concentrating      0   Moving slowly or fidgety/restless      0   Suicidal thoughts      0   PHQ-9 Score      4   Difficult doing work/chores      Somewhat difficult      Review of Systems  Constitutional: Negative.   HENT: Negative.    Eyes: Negative.   Respiratory: Negative.    Cardiovascular: Negative.   Gastrointestinal: Negative.   Endocrine: Negative.   Genitourinary: Negative.   Musculoskeletal: Negative.   Skin: Negative.   Allergic/Immunologic: Negative.   Neurological: Negative.   Hematological: Negative.   Psychiatric/Behavioral: Negative.        Objective:   Physical Exam General: No acute distress HEENT: NCAT, EOMI, oral membranes moist Cards: reg rate  Chest: normal effort Abdomen: Soft, NT, ND Skin: dry, intact Extremities: no edema Psych: appears down, pleasant as always Neuro:  Patient is cognitively appropriate.  Neuro cranial nerve exam.  Normal insight and awareness.  Strength is grossly 4 out of 5 in both upper extremities proximal distal.  Reflexes are 1-2+.  Sensory remains intact in all 4's. . Musculoskeletal: head forward posture once again. +impingement sign per-acromial pain left shoulder. Tight band of traps on left  Assessment & Plan:     1.  Fibromyalgia/myofascial pain involving the left shoulder girdle, particularly the rhomboid area. RELAXATION AND POSTURE DISCUSSED AGAIN MENTAL HEALTH ALSO REVIEWED. NEEDS TO GET OUT MORE, SOCIALIZE -After informed consent and preparation of the skin with isopropyl alcohol, I injected the left rhomboids (x2) with 2cc of 1% lidocaine. The patient tolerated well, and no complications were experienced. Post-injection instructions were provided.  -B12, vitamin D supplements. Continue thyroid supplementation also -adequate sleep  2. Rotator cuff syndrome bilaterally.            -HEP            - After informed consent and preparation of the skin with betadine and isopropyl alcohol, I injected '6mg'$  (1cc) of celestone and 4cc of 1% lidocaine into the left subacromial space via lateral approach. Additionally, aspiration was performed prior to injection. The patient tolerated well, and no complications were encountered. Afterward the area was cleaned and dressed. Post- injection instructions were provided.   3. Cervicalgia. Post-laminectomy syndrome, facet arthropathy:  Cervical Radiculopathy:     Continue Gabapentin.           -MS IR '15mg'$  q12 prn #80. no RF today            We will continue the controlled substance monitoring program, this consists of regular clinic visits, examinations, routine drug screening, pill counts as well as use of [place] Controlled Substance Reporting System. [place]CCS[place] was reviewed today.                                        -? cervical MBB's of cervical spine           -C7-T1 ESI without beneift. Repeat injections with some benefit           C5-6 foraminal stenosis on MRI-ESI's without much benefit            -After informed consent and preparation of the skin with isopropyl alcohol, I injected the left traps x 2 with 2cc of 1% lidocaine. The patient tolerated well, and no complications were experienced. Post-injection instructions were provided.   4. Depression/sleep:  Continue Cymbalta.   '60mg'$  bid        -continue neuropsych for pain mgt techniques, coping skills==very helpful             -melatonin 3 to 6 mg at bedtime.   6. Lumbar Radiculopathy:  HEP 7 OA of right hand:    Voltaren gel and heat therapy.              . 8. Migraines: stable, largely driven by shoulder girdle and neck pain.             -CONTINUE topamax at 100 mg qhs     9. Left CTS:S/P EMG on 03/08/2017: S/P Carpal Tunnel Release on 06/14/2017 via . Dr. Apolonio Schneiders.  10. OIC: movantik           -continue diet/probiotic and diet   15 min of face to face patient care time were spent during this visit. All questions were encouraged and answered.  Follow up with NP in 1 mos .

## 2022-05-04 ENCOUNTER — Encounter: Payer: Self-pay | Admitting: *Deleted

## 2022-05-05 ENCOUNTER — Other Ambulatory Visit: Payer: Self-pay | Admitting: Family Medicine

## 2022-05-11 IMAGING — DX DG CHEST 2V
2 series · 2 of 2 positions shown · non-contrast
Comparison: None.

CLINICAL DATA: Cough, dyspnea on exertion

EXAM:
CHEST - 2 VIEW

[chest pa]
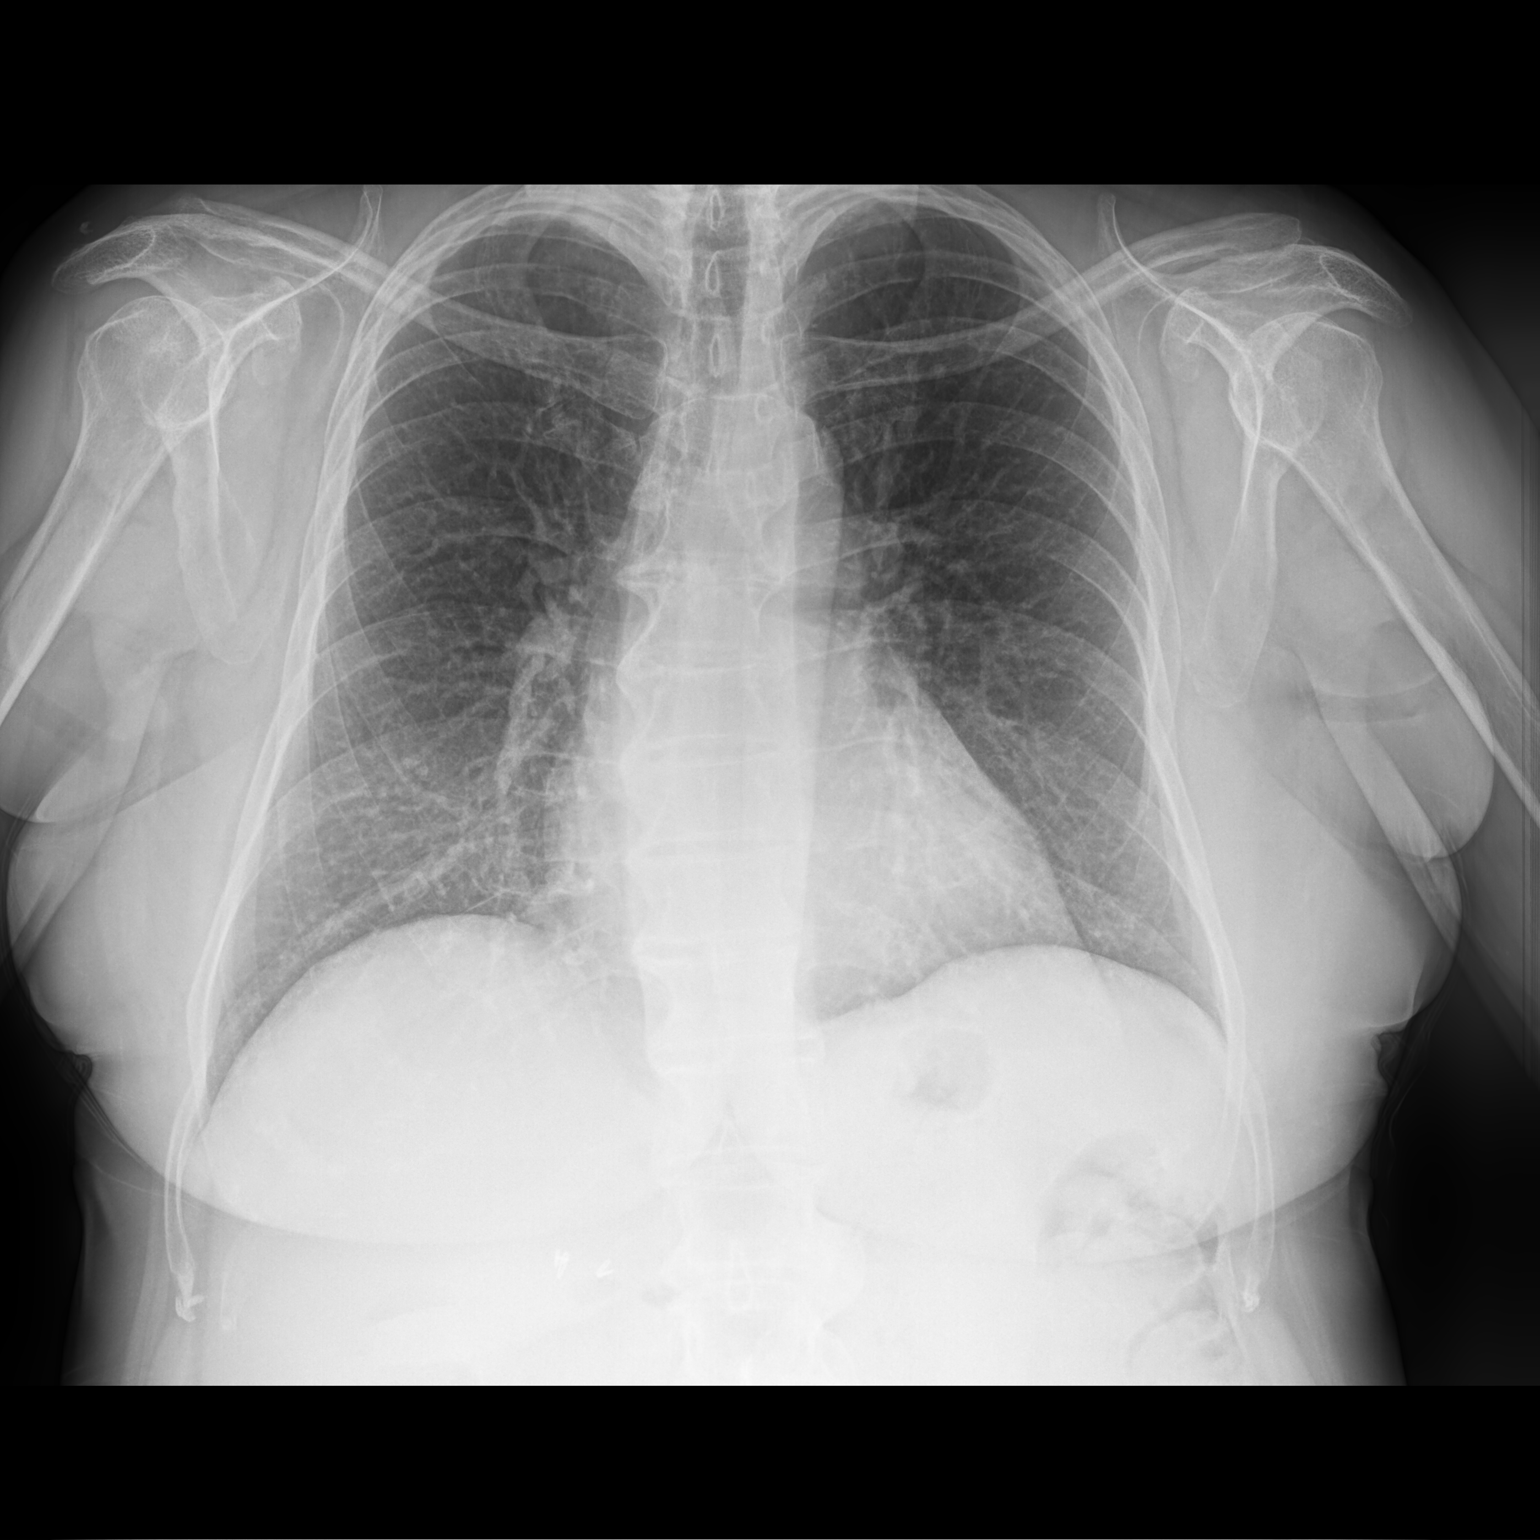

[chest lat]
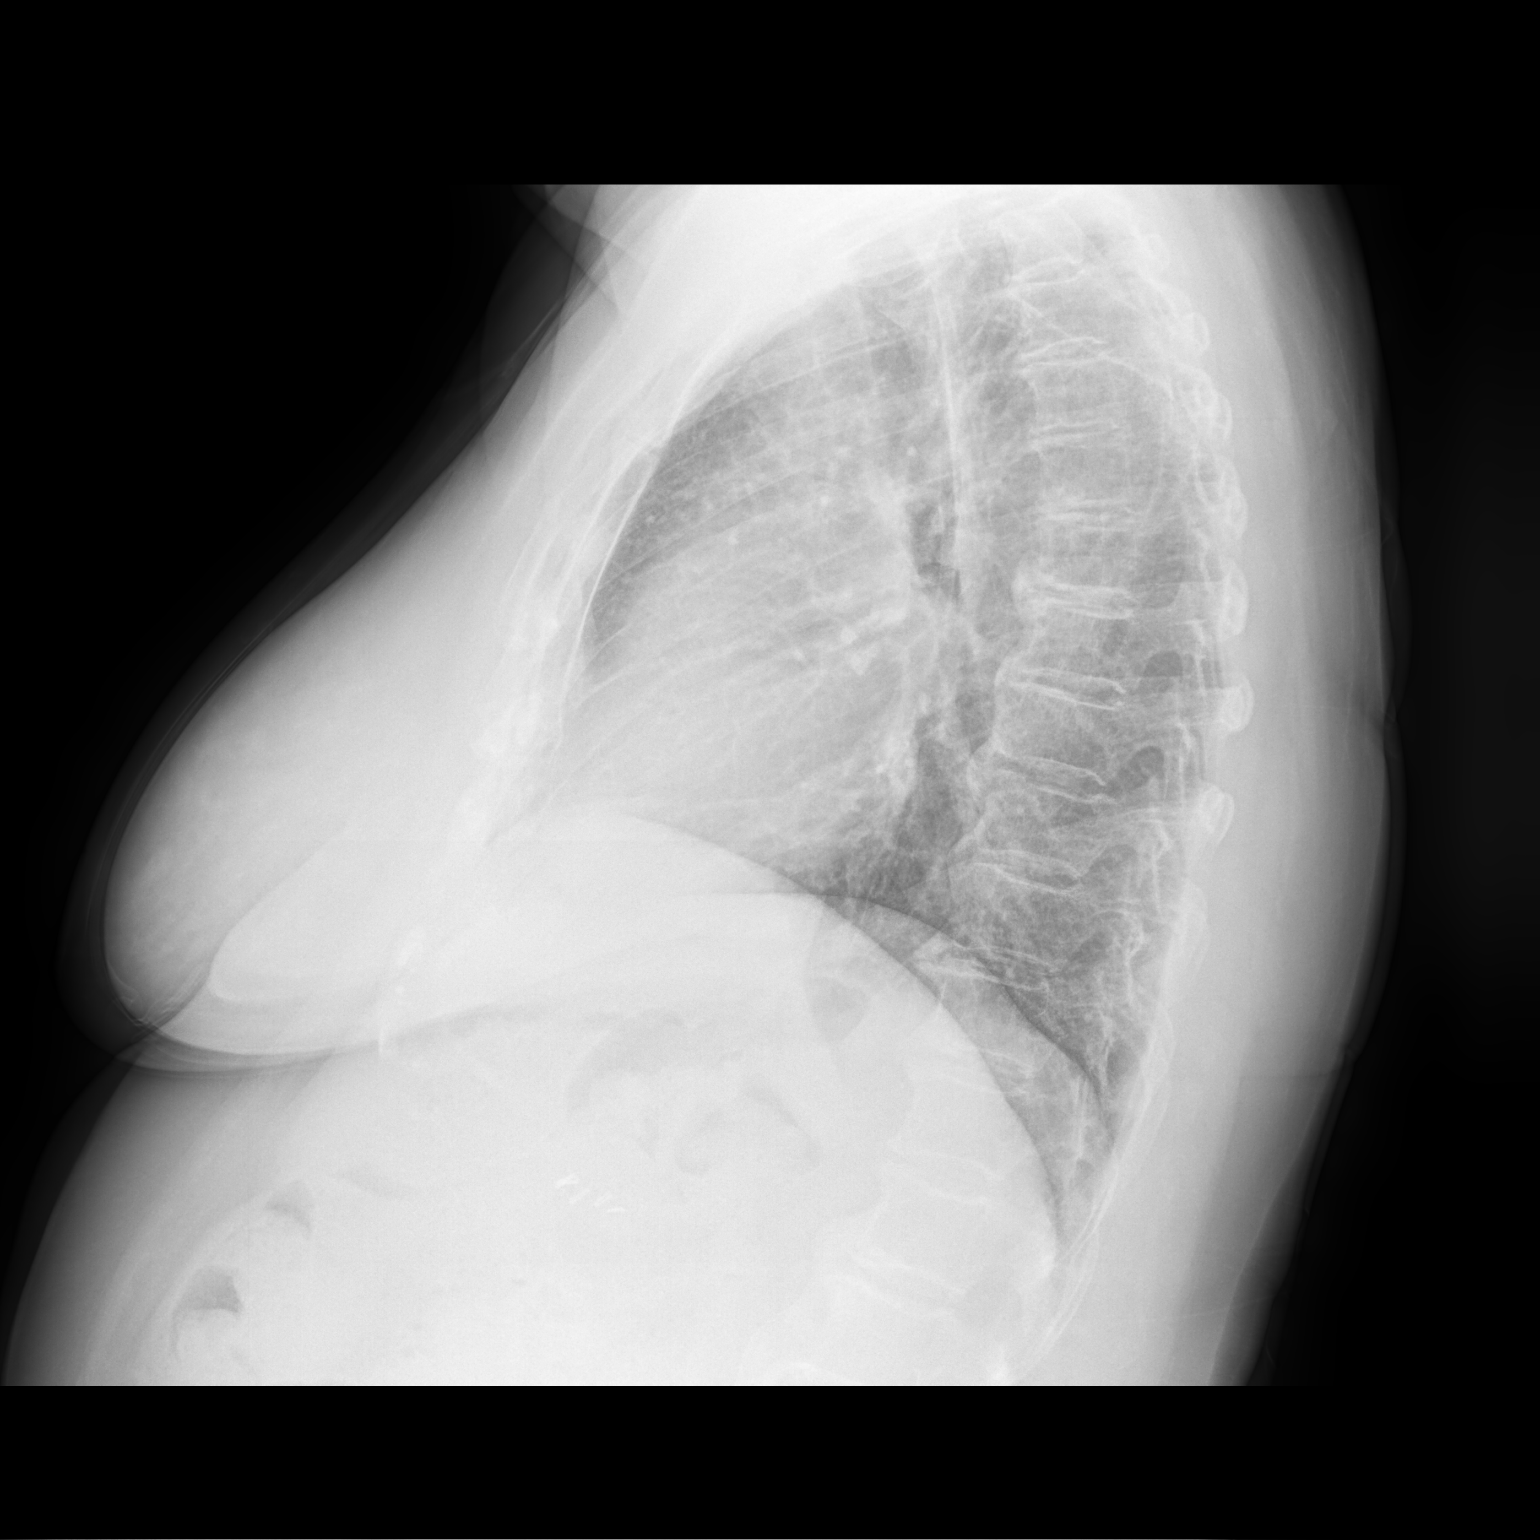

[2 of 2 positions shown; findings below may reference images not displayed]

FINDINGS: The heart size and mediastinal contours are within normal limits.
Both lungs are clear. Disc degenerative disease of the thoracic
spine.
IMPRESSION: No acute abnormality of the lungs.

## 2022-05-20 ENCOUNTER — Other Ambulatory Visit: Payer: Self-pay | Admitting: Family Medicine

## 2022-06-08 ENCOUNTER — Encounter: Payer: Medicare HMO | Admitting: Registered Nurse

## 2022-06-10 ENCOUNTER — Other Ambulatory Visit: Payer: Self-pay | Admitting: Family Medicine

## 2022-06-10 ENCOUNTER — Telehealth: Payer: Self-pay

## 2022-06-10 NOTE — Telephone Encounter (Signed)
Patient  has been in the bed since 05/26/2022 with pain. It started from her neck then down the back. Now its in both legs, hips & feet. It painful to walk & stand. She thinks a prednisone script will help.  Patent advised to call her PCP or go to an Urgent Care. Dr. Naaman Plummer is not in the office.

## 2022-06-17 ENCOUNTER — Other Ambulatory Visit: Payer: Self-pay | Admitting: Family Medicine

## 2022-06-23 DIAGNOSIS — G8929 Other chronic pain: Secondary | ICD-10-CM | POA: Diagnosis not present

## 2022-06-23 DIAGNOSIS — M25512 Pain in left shoulder: Secondary | ICD-10-CM | POA: Diagnosis not present

## 2022-06-23 DIAGNOSIS — M542 Cervicalgia: Secondary | ICD-10-CM | POA: Diagnosis not present

## 2022-06-25 ENCOUNTER — Other Ambulatory Visit: Payer: Self-pay | Admitting: Family Medicine

## 2022-07-15 ENCOUNTER — Telehealth: Payer: Self-pay

## 2022-07-15 MED ORDER — MORPHINE SULFATE 15 MG PO TABS: 15.0000 mg | ORAL_TABLET | Freq: Three times a day (TID) | ORAL | 0 refills | Status: DC | PRN

## 2022-07-15 NOTE — Telephone Encounter (Signed)
Pt called in requesting refill on her morphine, she said she has been in bed for 7 weeks and she has not been able to make her last appt or a new appt, and wants a refill.

## 2022-07-15 NOTE — Telephone Encounter (Signed)
I refilled her morphine. If she's been in bed so long, perhaps we need to get palliative care to the house? What's going on where she can't get out of bed? If she's attributing it to pain, she's only making it worse by staying in bed, along with her mood.

## 2022-07-21 NOTE — Telephone Encounter (Signed)
Had to leave VM informing her med was sent in. Requested a call back explaining the reason for bedrest for 7 weeks.

## 2022-07-23 ENCOUNTER — Encounter: Payer: Self-pay | Admitting: *Deleted

## 2022-08-05 ENCOUNTER — Other Ambulatory Visit: Payer: Self-pay | Admitting: Family Medicine

## 2022-09-01 ENCOUNTER — Encounter: Payer: 59 | Admitting: Psychology

## 2022-09-01 ENCOUNTER — Encounter: Payer: 59 | Attending: Psychology | Admitting: Psychology

## 2022-09-01 DIAGNOSIS — F339 Major depressive disorder, recurrent, unspecified: Secondary | ICD-10-CM

## 2022-09-01 DIAGNOSIS — G43019 Migraine without aura, intractable, without status migrainosus: Secondary | ICD-10-CM | POA: Diagnosis not present

## 2022-09-01 DIAGNOSIS — M7918 Myalgia, other site: Secondary | ICD-10-CM | POA: Insufficient documentation

## 2022-09-01 DIAGNOSIS — G894 Chronic pain syndrome: Secondary | ICD-10-CM | POA: Diagnosis not present

## 2022-09-01 DIAGNOSIS — M797 Fibromyalgia: Secondary | ICD-10-CM

## 2022-09-01 DIAGNOSIS — M25512 Pain in left shoulder: Secondary | ICD-10-CM | POA: Diagnosis not present

## 2022-09-02 ENCOUNTER — Encounter: Payer: Self-pay | Admitting: Physical Medicine & Rehabilitation

## 2022-09-02 ENCOUNTER — Encounter (HOSPITAL_BASED_OUTPATIENT_CLINIC_OR_DEPARTMENT_OTHER): Payer: 59 | Admitting: Physical Medicine & Rehabilitation

## 2022-09-02 VITALS — BP 105/64 | HR 99 | Ht 61.0 in | Wt 136.0 lb

## 2022-09-02 DIAGNOSIS — M797 Fibromyalgia: Secondary | ICD-10-CM | POA: Diagnosis not present

## 2022-09-02 DIAGNOSIS — M7918 Myalgia, other site: Secondary | ICD-10-CM | POA: Diagnosis not present

## 2022-09-02 DIAGNOSIS — G43019 Migraine without aura, intractable, without status migrainosus: Secondary | ICD-10-CM | POA: Diagnosis not present

## 2022-09-02 MED ORDER — TIZANIDINE HCL 2 MG PO TABS: ORAL_TABLET | ORAL | 2 refills | Status: DC

## 2022-09-02 MED ORDER — TOPIRAMATE 100 MG PO TABS: 100.0000 mg | ORAL_TABLET | Freq: Every day | ORAL | 2 refills | Status: DC

## 2022-09-02 MED ORDER — MORPHINE SULFATE 15 MG PO TABS: 15.0000 mg | ORAL_TABLET | Freq: Three times a day (TID) | ORAL | 0 refills | Status: DC | PRN

## 2022-09-02 NOTE — Progress Notes (Signed)
Subjective:    Patient ID: Robin Morgan, female    DOB: 10-30-1955, 67 y.o.   MRN: [id number]  HPI Robin Morgan is here in follow up of her chronic pain. She has seen surgery regarding her neck.  I am not seeing there notes yet but it sounds as if there recommending some type of fusion surgery.  Patient is hesitant to pursue something that significant.  She expressed to me that I had not indicated her neck was that severe.  She remains on MS IR 15 mg every 8 hours as needed.  She also takes gabapentin, Cymbalta, Topamax, tizanidine for pain control.  She states that her pain can be very limiting from her neck down to her back.  She reports at times that her neck and back starts to lock up her whole back and lower body will lock up as well.  She admits to staying home for weeks on end.  She is in her bedroom a lot.  She does have some friends that she likes to spend time with him does have some things socially that she enjoys to do.  Family remains a barrier for her and she has a difficult relationship with her siblings unfortunately which weighs heavily on her.  Pain Inventory Average Pain 10 Pain Right Now 7 My pain is constant, stabbing, and aching  In the last 24 hours, has pain interfered with the following? General activity 10 Relation with others 10 Enjoyment of life 3 What TIME of day is your pain at its worst? morning  and evening Sleep (in general) Fair  Pain is worse with: walking, bending, sitting, inactivity, standing, and some activites Pain improves with: rest and medication Relief from Meds: 5  Family History  Problem Relation Age of Onset   Alzheimer's disease Mother    Diabetes Father    Heart disease Maternal Grandmother    Breast cancer Paternal Grandmother 77   Social History   Socioeconomic History   Marital status: Divorced    Spouse name: Not on file   Number of children: 1   Years of education: Not on file   Highest education level: Not on file  Occupational  History   Occupation: disabled  Tobacco Use   Smoking status: Never   Smokeless tobacco: Never  Vaping Use   Vaping Use: Never used  Substance and Sexual Activity   Alcohol use: No   Drug use: No   Sexual activity: Not Currently  Other Topics Concern   Not on file  Social History Narrative   Not on file   Social Determinants of Health   Financial Resource Strain: Low Risk  (12/06/2020)   Overall Financial Resource Strain (CARDIA)    Difficulty of Paying Living Expenses: Not hard at all  Food Insecurity: No Food Insecurity (12/06/2020)   Hunger Vital Sign    Worried About Running Out of Food in the Last Year: Never true    Ran Out of Food in the Last Year: Never true  Transportation Needs: No Transportation Needs (12/06/2020)   PRAPARE - Hydrologist (Medical): No    Lack of Transportation (Non-Medical): No  Physical Activity: Inactive (12/06/2020)   Exercise Vital Sign    Days of Exercise per Week: 0 days    Minutes of Exercise per Session: 0 min  Stress: No Stress Concern Present (12/06/2020)   [place]    Feeling of Stress : Only  a little  Social Connections: Socially Isolated (12/06/2020)   Social Connection and Isolation Panel [NHANES]    Frequency of Communication with Friends and Family: Twice a week    Frequency of Social Gatherings with Friends and Family: Once a week    Attends Religious Services: Never    Marine scientist or Organizations: No    Attends Music therapist: Never    Marital Status: Divorced   Past Surgical History:  Procedure Laterality Date   [place] EXCISIONAL BIOPSY Right 1990's   BREAST SURGERY     rt breast cyst done in the [place]     right   [place]   Past  Surgical History:  Procedure Laterality Date   ABDOMINAL [place] EXCISIONAL BIOPSY Right 1990's   BREAST SURGERY     rt breast cyst done in the 59's   [place]     right   [place]   Past Medical History:  Diagnosis Date   Calcific tendonitis    Cervical spondylosis without myelopathy    Depression    Diabetes mellitus    Esophageal stricture    Fibromyalgia    GERD (gastroesophageal reflux disease)    Hiatal hernia    Hyperlipidemia    Hypertension    There were no vitals taken for this visit.  Opioid Risk Score:   Fall Risk Score:  `1  Depression screen [facility] 2/9     04/29/2022    1:03 PM 04/03/2022   12:59 PM 01/29/2022    1:31 PM 12/08/2021    1:45 PM 10/08/2021    2:27 PM 09/01/2021    2:09 PM 07/02/2021    2:03 PM  Depression screen PHQ 2/9  Decreased Interest 1 0 1 0 0 2 1  Down, Depressed, Hopeless 1 0 1 0 1 0 1  PHQ - 2 Score 2 0 2 0 '1 2 2  '$ Altered sleeping      0   Tired, decreased energy      2   Change in appetite      0   Feeling bad or failure about yourself       0   Trouble concentrating      0   Moving slowly or fidgety/restless      0   Suicidal thoughts      0   PHQ-9 Score      4   Difficult doing work/chores      Somewhat difficult     Review of Systems  Musculoskeletal:  Positive for back pain and neck pain.       Pain in the both hips & both legs Left shoulder pain  Neurological:  Positive for headaches.  All other systems reviewed and are negative.      Objective:   Physical Exam  General: No acute distress HEENT: NCAT, EOMI, oral membranes moist Cards: reg rate  Chest: normal effort Abdomen: Soft, NT, ND Skin: dry, intact Extremities: no  edema Psych: Flat but overall pleasant and appropriate. Neuro:  Patient is cognitively appropriate.  Neuro cranial nerve exam.  Normal insight and  awareness.  Strength is grossly 4 out of 5 in both upper extremities proximal distal.  Reflexes are 1-2+.  Sensory remains intact in all 4's. Marland Kitchen  No changes in motor and sensory exam today. Musculoskeletal: Continued head forward posture.  +impingement sign per-acromial pain left shoulder.  Trapezius muscles are tight to palpation left greater than right.  Gait was fairly stable    Assessment & Plan:     1. Fibromyalgia/myofascial pain involving the left shoulder girdle, particularly the rhomboid area. RELAXATION AND POSTURE DISCUSSED AGAIN MENTAL HEALTH ALSO REVIEWED. NEEDS TO GET OUT MORE, SOCIALIZE              -B12, vitamin D supplements. Continue thyroid supplementation also  2. Rotator cuff syndrome bilaterally.            -HEP            3. Cervicalgia. Post-laminectomy syndrome, facet arthropathy:  Cervical Radiculopathy:     Continue Gabapentin.           -MS IR '15mg'$  q12 prn #80.  I refilled this today.  I do not plan on increasing this in the near future.            We will continue the controlled substance monitoring program, this consists of regular clinic visits, examinations, routine drug screening, pill counts as well as use of [place] Controlled Substance Reporting System. [place]CCS[place] was reviewed today.                                        -? cervical MBB's of cervical spine           -C7-T1 ESI without beneift. Repeat injections with some benefit           C5-6 foraminal stenosis on MRI-ESI's without much benefit            -NS recommending fusion?  Ultimately this choice is up to her -I do feel that her pain is certainly exacerbated by her fibromyalgia as well as her ongoing depression. 4. Depression/sleep: This is really her biggest problem. Continue Cymbalta.   90 mg daily        -continue neuropsych for pain mgt techniques, coping skills==very helpful             -melatonin 3 to 6 mg at bedtime.    -See #1 above.  Really needs to get out of her house and pursue  physical activity, sunshine, social activities as well is leisure activities. 6. Lumbar Radiculopathy:  HEP 7 OA of right hand:    Voltaren gel and heat therapy.              . 8. Migraines: stable, largely driven by shoulder girdle and neck pain.             -CONTINUE topamax at 100 mg qhs--- consolidated to 100 mg tablet     9. Left CTS:S/P EMG on 03/08/2017: S/P Carpal Tunnel Release on 06/14/2017 via . Dr. Apolonio Schneiders.  10. OIC: movantik           -continue diet/probiotic and diet   15 min of face to face patient care time were spent during this visit. All questions were encouraged and answered.  Follow up with NP  in 1 mos .

## 2022-09-02 NOTE — Patient Instructions (Signed)
ALWAYS FEEL FREE TO CALL OUR OFFICE WITH ANY PROBLEMS OR QUESTIONS ([phone])  **PLEASE NOTE** ALL MEDICATION REFILL REQUESTS (INCLUDING CONTROLLED SUBSTANCES) NEED TO BE MADE AT LEAST 7 DAYS PRIOR TO REFILL BEING DUE. ANY REFILL REQUESTS INSIDE THAT TIME FRAME MAY RESULT IN DELAYS IN RECEIVING YOUR PRESCRIPTION.                    

## 2022-09-04 ENCOUNTER — Telehealth: Payer: Self-pay

## 2022-09-04 NOTE — Telephone Encounter (Signed)
PA submitted for oxycodone Robin Morgan (Key: [id number])

## 2022-09-08 ENCOUNTER — Other Ambulatory Visit: Payer: Self-pay | Admitting: Family Medicine

## 2022-09-08 ENCOUNTER — Other Ambulatory Visit: Payer: Self-pay | Admitting: Registered Nurse

## 2022-09-15 DIAGNOSIS — M25412 Effusion, left shoulder: Secondary | ICD-10-CM | POA: Diagnosis not present

## 2022-09-15 DIAGNOSIS — S46012A Strain of muscle(s) and tendon(s) of the rotator cuff of left shoulder, initial encounter: Secondary | ICD-10-CM | POA: Diagnosis not present

## 2022-09-15 DIAGNOSIS — M5416 Radiculopathy, lumbar region: Secondary | ICD-10-CM | POA: Diagnosis not present

## 2022-09-15 DIAGNOSIS — M25512 Pain in left shoulder: Secondary | ICD-10-CM | POA: Diagnosis not present

## 2022-09-18 ENCOUNTER — Other Ambulatory Visit: Payer: Self-pay | Admitting: Family Medicine

## 2022-09-19 ENCOUNTER — Other Ambulatory Visit: Payer: Self-pay | Admitting: Family Medicine

## 2022-09-21 NOTE — Telephone Encounter (Signed)
Call placed to Robin Morgan, she still taking Gabapentin as prescribed. Gabapentin ordered today. She is aware of the above and verbalizes understanding.

## 2022-09-22 ENCOUNTER — Other Ambulatory Visit: Payer: Self-pay | Admitting: Orthopaedic Surgery

## 2022-09-22 DIAGNOSIS — Z01818 Encounter for other preprocedural examination: Secondary | ICD-10-CM

## 2022-09-22 DIAGNOSIS — M25512 Pain in left shoulder: Secondary | ICD-10-CM | POA: Diagnosis not present

## 2022-09-28 ENCOUNTER — Encounter: Payer: Self-pay | Admitting: Family Medicine

## 2022-09-28 ENCOUNTER — Ambulatory Visit (INDEPENDENT_AMBULATORY_CARE_PROVIDER_SITE_OTHER): Payer: 59 | Admitting: Family Medicine

## 2022-09-28 VITALS — BP 108/54 | HR 94 | Temp 97.8°F | Ht 61.0 in | Wt 134.2 lb

## 2022-09-28 DIAGNOSIS — I152 Hypertension secondary to endocrine disorders: Secondary | ICD-10-CM | POA: Diagnosis not present

## 2022-09-28 DIAGNOSIS — E039 Hypothyroidism, unspecified: Secondary | ICD-10-CM

## 2022-09-28 DIAGNOSIS — G894 Chronic pain syndrome: Secondary | ICD-10-CM

## 2022-09-28 DIAGNOSIS — S81802A Unspecified open wound, left lower leg, initial encounter: Secondary | ICD-10-CM | POA: Diagnosis not present

## 2022-09-28 DIAGNOSIS — R3915 Urgency of urination: Secondary | ICD-10-CM

## 2022-09-28 DIAGNOSIS — E782 Mixed hyperlipidemia: Secondary | ICD-10-CM

## 2022-09-28 DIAGNOSIS — S46012S Strain of muscle(s) and tendon(s) of the rotator cuff of left shoulder, sequela: Secondary | ICD-10-CM

## 2022-09-28 DIAGNOSIS — W540XXA Bitten by dog, initial encounter: Secondary | ICD-10-CM | POA: Diagnosis not present

## 2022-09-28 DIAGNOSIS — E538 Deficiency of other specified B group vitamins: Secondary | ICD-10-CM

## 2022-09-28 DIAGNOSIS — E1159 Type 2 diabetes mellitus with other circulatory complications: Secondary | ICD-10-CM

## 2022-09-28 DIAGNOSIS — Z1231 Encounter for screening mammogram for malignant neoplasm of breast: Secondary | ICD-10-CM

## 2022-09-28 DIAGNOSIS — Z794 Long term (current) use of insulin: Secondary | ICD-10-CM

## 2022-09-28 DIAGNOSIS — E1140 Type 2 diabetes mellitus with diabetic neuropathy, unspecified: Secondary | ICD-10-CM

## 2022-09-28 DIAGNOSIS — Z23 Encounter for immunization: Secondary | ICD-10-CM

## 2022-09-28 DIAGNOSIS — E1169 Type 2 diabetes mellitus with other specified complication: Secondary | ICD-10-CM

## 2022-09-28 DIAGNOSIS — E559 Vitamin D deficiency, unspecified: Secondary | ICD-10-CM | POA: Diagnosis not present

## 2022-09-28 DIAGNOSIS — F339 Major depressive disorder, recurrent, unspecified: Secondary | ICD-10-CM

## 2022-09-28 DIAGNOSIS — S46012A Strain of muscle(s) and tendon(s) of the rotator cuff of left shoulder, initial encounter: Secondary | ICD-10-CM | POA: Insufficient documentation

## 2022-09-28 MED ORDER — AMOXICILLIN-POT CLAVULANATE 875-125 MG PO TABS: 1.0000 | ORAL_TABLET | Freq: Two times a day (BID) | ORAL | 0 refills | Status: DC

## 2022-09-28 NOTE — Patient Instructions (Addendum)
Please return in 3 months for your annual complete physical; please come fasting.   I will release your lab results to you on your MyChart account with further instructions. You may see the results before I do, but when I review them I will send you a message with my report or have my assistant call you if things need to be discussed. Please reply to my message with any questions. Thank you!   If you have any questions or concerns, please don't hesitate to send me a message via MyChart or call the office at [phone]. Thank you for visiting with [place] today! It's our pleasure caring for you.   I have ordered a mammogram and/or bone density for you as we discussed today: [x]$   Mammogram  []$   Bone Density  Please call the office checked below to schedule your appointment:  [x]$   [facility]      [address] [place], [place]         []$   [facility]  [address] [place], [place]

## 2022-09-28 NOTE — Progress Notes (Signed)
Subjective  CC:  Chief Complaint  Patient presents with   surgcal clearance    Pt stated that Robin Morgan is trying to get cleared for surgery for her Lt shoulder     HPI: Robin Morgan is a 67 y.o. female who presents to the office today for follow up of diabetes and problems listed above in the chief complaint.  67 year old female with multiple medical problems that was last here almost a year ago.  Robin Morgan is here today for medical surgical clearance.  Robin Morgan has a chronic left rotator cuff tear that needs repair.  Her surgeon is at [facility] and [facility].  Diabetes: Robin Morgan reports her fasting sugars are controlled in the 90s typically.  Robin Morgan takes Farxiga 10 mg daily, Basaglar 35 units twice daily, Glucotrol XL 5 daily and metformin 1000 twice daily.  Robin Morgan feels her diabetes is controlled. Hypertension on lisinopril 5 mg daily.  Robin Morgan has no history of heart disease.  No chest pain. Hyperlipidemia: Robin Morgan does take a statin although Robin Morgan is unclear which statin which does.  Multiple statins are listed on her current medication list.  Robin Morgan will bring in her medications to her next visit.  Robin Morgan is overdue for a lipid panel.  Robin Morgan is not fasting today.  Goal LDL is less than 70 Robin Morgan reports Robin Morgan was bitten by her neighbors dog 3 days ago.  Robin Morgan has a tender wound on her left shin.  No fevers or chills.  Robin Morgan reports it does drain fluid. Hypothyroidism on 50 mcg of levothyroxine daily.  Overdue for recheck.  Clinically stable Robin Morgan has chronic depression on antidepressant. History of vitamin deficiencies Health maintenance: Robin Morgan is overdue for complete physical.  Robin Morgan is due for mammogram flu shot and diabetic eye exam. Wt Readings from Last 3 Encounters:  09/28/22 134 lb 3.2 oz (60.9 kg)  09/02/22 136 lb (61.7 kg)  04/29/22 135 lb 6.4 oz (61.4 kg)    BP Readings from Last 3 Encounters:  09/28/22 (!) 108/54  09/02/22 105/64  04/29/22 116/69    Assessment  1. Controlled type 2 diabetes mellitus with diabetic  neuropathy, with long-term current use of insulin ([place])   2. Hypertension associated with diabetes ([place])   3. Combined hyperlipidemia associated with type 2 diabetes mellitus ([place])   4. Hypothyroidism, acquired   5. Chronic pain associated with significant psychosocial dysfunction   6. Dog bite, initial encounter   7. Depression, recurrent ([place])   8. Encounter for screening mammogram for breast cancer   9. Urinary urgency   10. Vitamin D deficiency   11. Vitamin B12 deficiency   12. Traumatic tear of left rotator cuff, unspecified tear extent, sequela   13. Need for Tdap vaccination   14. Need for immunization against influenza      Plan  Diabetes follow-up: Will check A1c.  Continue current medications and adjust if needed.  Fasting sugars show that it seems to be well-controlled.  Robin Morgan is due for an eye exam.  Check urine nephropathy and renal function today. Blood pressure is controlled on ACE inhibitor Hyperlipidemia due for recheck on a statin.  Robin Morgan will let me know which statin and what dose Robin Morgan is taking Recheck hypothyroidism on levothyroxine.  Clinically stable Will send in medical clearance for her surgery once all lab work is back. Dog bite: Clinically infected.  Augmentin 875 twice daily and tetanus updated today. Recommend mammogram, patient to schedule Flu shot updated today. Recheck vitamin levels. Counseling education given for appropriate  timely follow-up.  I spent a total of 48 minutes for this patient encounter. Time spent included preparation, face-to-face counseling with the patient and coordination of care, review of chart and records, and documentation of the encounter.   Follow up: 3 months for complete physical. Orders Placed This Encounter  Procedures   Urine Culture   MM DIGITAL SCREENING BILATERAL   Tdap vaccine greater than or equal to 7yo IM   Flu Vaccine QUAD High Dose(Fluad)   CBC with Differential/Platelet   Comprehensive metabolic panel    Hemoglobin A1c   Lipid panel   TSH   Microalbumin / creatinine urine ratio   VITAMIN D 25 Hydroxy (Vit-D Deficiency, Fractures)   Vitamin B12   Meds ordered this encounter  Medications   amoxicillin-clavulanate (AUGMENTIN) 875-125 MG tablet    Sig: Take 1 tablet by mouth 2 (two) times daily.    Dispense:  14 tablet    Refill:  0      Immunization History  Administered Date(s) Administered   Fluad Quad(high Dose 65+) 04/06/2019, 09/01/2021, 09/28/2022   Influenza Split 06/04/2009, 05/02/2012   Influenza, High Dose Seasonal PF 05/13/2020   Influenza, Quadrivalent, Recombinant, Inj, Pf 05/12/2016, 07/07/2017   Influenza, Seasonal, Injecte, Preservative Fre 06/06/2014, 04/24/2015   Influenza,inj,Quad PF,6+ Mos 05/12/2016, 07/07/2017, 05/05/2018   Influenza-Unspecified 09/03/2011, 04/19/2013   PFIZER(Purple Top)SARS-COV-2 Vaccination 10/03/2019, 10/24/2019, 06/28/2020   Pneumococcal Conjugate-13 12/01/2019   Pneumococcal Polysaccharide-23 08/29/2009, 10/19/2018   Tdap 05/11/2012, 09/28/2022    Diabetes Related Lab Review: Lab Results  Component Value Date   HGBA1C 6.4 (A) 12/03/2021   HGBA1C 6.6 (H) 09/01/2021   HGBA1C 7.2 (H) 09/20/2020    Lab Results  Component Value Date   MICROALBUR <0.7 12/03/2021   Lab Results  Component Value Date   CREATININE 0.89 09/01/2021   BUN 18 09/01/2021   NA 142 09/01/2021   K 4.0 09/01/2021   CL 109 09/01/2021   CO2 25 09/01/2021   Lab Results  Component Value Date   CHOL 128 09/01/2021   CHOL 116 05/27/2020   CHOL 140 06/05/2019   Lab Results  Component Value Date   HDL 41.90 09/01/2021   HDL 39 (L) 05/27/2020   HDL 36.50 (L) 06/05/2019   Lab Results  Component Value Date   LDLCALC 64 09/01/2021   LDLCALC 56 05/27/2020   LDLCALC 73 06/05/2019   Lab Results  Component Value Date   TRIG 112.0 09/01/2021   TRIG 124 05/27/2020   TRIG 151.0 (H) 06/05/2019   Lab Results  Component Value Date   CHOLHDL 3 09/01/2021    CHOLHDL 3.0 05/27/2020   CHOLHDL 4 06/05/2019   No results found for: "LDLDIRECT" The ASCVD Risk score (Arnett DK, et al., 2019) failed to calculate for the following reasons:   The valid total cholesterol range is 130 to 320 mg/dL I have reviewed the PMH, Fam and Soc history. Patient Active Problem List   Diagnosis Date Noted   Diabetic peripheral neuropathy associated with type 2 diabetes mellitus ([place]) 01/06/2017    Priority: High   OSA (obstructive sleep apnea) 03/10/2016    Priority: High   Controlled diabetes mellitus with diabetic neuropathy, with long-term current use of insulin ([place]) 10/11/2014    Priority: High   Hypertension associated with diabetes ([place]) 10/11/2014    Priority: High   Combined hyperlipidemia associated with type 2 diabetes mellitus ([place]) 10/11/2014    Priority: High   DDD (degenerative disc disease), lumbar 01/15/2014    Priority:  High   Hypothyroidism, acquired 06/12/2013    Priority: High   Cervical post-laminectomy syndrome 05/10/2013    Priority: High   Chronic pain associated with significant psychosocial dysfunction 01/03/2010    Priority: High    Chronic Pain Syndrome, Dr. Earl Lites     Esophageal stricture     Priority: Medium    Tenosynovitis, wrist 10/07/2017    Priority: Medium    Trigger thumb of left hand 10/07/2017    Priority: Medium    Laryngopharyngeal reflux (LPR) 08/26/2017    Priority: Medium    Dysphagia 08/26/2017    Priority: Medium    Left carpal tunnel syndrome 03/08/2017    Priority: Medium    Fibromyalgia 02/12/2016    Priority: Medium    Osteoporosis 12/01/2012    Priority: Medium     01/2021: osteoporosis T= -2.7 wrist. Spine excluded. Offer tx.  10/2017: osteopenia: T = -1.9; stable. Recheck 2 years 4/ 2014 T = -1.2 at hip, -1.8 at spine    Chronic diarrhea 07/04/2012    Priority: Medium     Overview:  ? Related to gallbladder surgery. Has been evaluated by Dr. [facility]. Lomotil as needed.    Traumatic  tear of left rotator cuff 09/28/2022   Vitamin D deficiency 09/28/2022   Vitamin B12 deficiency 09/28/2022   Urinary urgency 09/28/2022   Rotator cuff syndrome, left 12/11/2020   Migraine without aura, intractable, without status migrainosus 07/31/2020   Cervical disc disorder with radiculopathy of cervical region 07/31/2020   Biceps tendinitis, right 02/07/2020   Mid back pain 12/06/2019   Insomnia due to medical condition 12/01/2019   Chronic prescription opiate use 09/02/2018   Myofascial muscle pain 10/28/2011   Depression, recurrent ([place]) 10/28/2011    Social History: Patient  reports that Robin Morgan has never smoked. Robin Morgan has never used smokeless tobacco. Robin Morgan reports that Robin Morgan does not drink alcohol and does not use drugs.  Review of Systems: Ophthalmic: negative for eye pain, loss of vision or double vision Cardiovascular: negative for chest pain Respiratory: negative for SOB or persistent cough Gastrointestinal: negative for abdominal pain Genitourinary: negative for dysuria or gross hematuria MSK: negative for foot lesions Neurologic: negative for weakness or gait disturbance  Objective  Vitals: BP (!) 108/54   Pulse 94   Temp 97.8 F (36.6 C)   Ht 5' 1"$  (1.549 m)   Wt 134 lb 3.2 oz (60.9 kg)   SpO2 96%   BMI 25.36 kg/m  General: well appearing, no acute distress  Psych:  Alert and oriented, normal mood and affect HEENT:  Normocephalic, atraumatic, moist mucous membranes, supple neck  Cardiovascular:  Nl S1 and S2, RRR without murmur, gallop or rub. no edema Respiratory:  Good breath sounds bilaterally, CTAB with normal effort, no rales Gastrointestinal: normal BS, soft, nontender Lower extremity: No edema: Left shin with quarter size bite wound without pus present.  Tenderness present. Foot exam: no erythema, pallor, or cyanosis visible nl proprioception and sensation to monofilament testing bilaterally, +2 distal pulses bilaterally    Diabetic education: ongoing  education regarding chronic disease management for diabetes was given today. We continue to reinforce the ABC's of diabetic management: A1c (<7 or 8 dependent upon patient), tight blood pressure control, and cholesterol management with goal LDL < 100 minimally. We discuss diet strategies, exercise recommendations, medication options and possible side effects. At each visit, we review recommended immunizations and preventive care recommendations for diabetics and stress that good diabetic control can prevent other problems. See  below for this patient's data.   Commons side effects, risks, benefits, and alternatives for medications and treatment plan prescribed today were discussed, and the patient expressed understanding of the given instructions. Patient is instructed to call or message via MyChart if he/Robin Morgan has any questions or concerns regarding our treatment plan. No barriers to understanding were identified. We discussed Red Flag symptoms and signs in detail. Patient expressed understanding regarding what to do in case of urgent or emergency type symptoms.  Medication list was reconciled, printed and provided to the patient in AVS. Patient instructions and summary information was reviewed with the patient as documented in the AVS. This note was prepared with assistance of Dragon voice recognition software. Occasional wrong-word or sound-a-like substitutions may have occurred due to the inherent limitations of voice recognition software

## 2022-09-29 LAB — HEMOGLOBIN A1C: Hgb A1c MFr Bld: 6.9 % — ABNORMAL HIGH (ref 4.6–6.5)

## 2022-09-29 LAB — CBC WITH DIFFERENTIAL/PLATELET
Basophils Absolute: 0.1 10*3/uL (ref 0.0–0.1)
Basophils Relative: 0.9 % (ref 0.0–3.0)
Eosinophils Absolute: 0.3 10*3/uL (ref 0.0–0.7)
Eosinophils Relative: 3.1 % (ref 0.0–5.0)
HCT: 41.3 % (ref 36.0–46.0)
Hemoglobin: 13 g/dL (ref 12.0–15.0)
Lymphocytes Relative: 41.8 % (ref 12.0–46.0)
Lymphs Abs: 3.6 10*3/uL (ref 0.7–4.0)
MCHC: 31.5 g/dL (ref 30.0–36.0)
MCV: 87 fl (ref 78.0–100.0)
Monocytes Absolute: 0.6 10*3/uL (ref 0.1–1.0)
Monocytes Relative: 7.6 % (ref 3.0–12.0)
Neutro Abs: 4 10*3/uL (ref 1.4–7.7)
Neutrophils Relative %: 46.6 % (ref 43.0–77.0)
Platelets: 332 10*3/uL (ref 150.0–400.0)
RBC: 4.75 Mil/uL (ref 3.87–5.11)
RDW: 14.8 % (ref 11.5–15.5)
WBC: 8.6 10*3/uL (ref 4.0–10.5)

## 2022-09-29 LAB — URINE CULTURE
MICRO NUMBER:: 14583243
SPECIMEN QUALITY:: ADEQUATE

## 2022-09-29 LAB — COMPREHENSIVE METABOLIC PANEL
ALT: 15 U/L (ref 0–35)
AST: 23 U/L (ref 0–37)
Albumin: 4.3 g/dL (ref 3.5–5.2)
Alkaline Phosphatase: 60 U/L (ref 39–117)
BUN: 19 mg/dL (ref 6–23)
CO2: 24 mEq/L (ref 19–32)
Calcium: 9.9 mg/dL (ref 8.4–10.5)
Chloride: 107 mEq/L (ref 96–112)
Creatinine, Ser: 0.9 mg/dL (ref 0.40–1.20)
GFR: 65.44 mL/min (ref >60.00)
Glucose, Bld: 102 mg/dL — ABNORMAL HIGH (ref 70–99)
Potassium: 4.7 mEq/L (ref 3.5–5.1)
Sodium: 142 mEq/L (ref 135–145)
Total Bilirubin: 0.4 mg/dL (ref 0.2–1.2)
Total Protein: 6.7 g/dL (ref 6.0–8.3)

## 2022-09-29 LAB — MICROALBUMIN / CREATININE URINE RATIO
Creatinine,U: 69.1 mg/dL
Microalb Creat Ratio: 1.1 mg/g (ref 0.0–30.0)
Microalb, Ur: 0.7 mg/dL (ref 0.0–1.9)

## 2022-09-29 LAB — LIPID PANEL
Cholesterol: 134 mg/dL (ref 0–200)
HDL: 43.5 mg/dL (ref >39.00)
LDL Cholesterol: 58 mg/dL (ref 0–99)
NonHDL: 90.12
Total CHOL/HDL Ratio: 3
Triglycerides: 160 mg/dL — ABNORMAL HIGH (ref 0.0–149.0)
VLDL: 32 mg/dL (ref 0.0–40.0)

## 2022-09-29 LAB — VITAMIN B12: Vitamin B-12: 425 pg/mL (ref 211–911)

## 2022-09-29 LAB — TSH: TSH: 1.25 u[IU]/mL (ref 0.35–5.50)

## 2022-09-29 LAB — VITAMIN D 25 HYDROXY (VIT D DEFICIENCY, FRACTURES): VITD: 37.57 ng/mL (ref 30.00–100.00)

## 2022-09-30 ENCOUNTER — Ambulatory Visit
Admission: RE | Admit: 2022-09-30 | Discharge: 2022-09-30 | Disposition: A | Discharge status: Home / Self Care | Payer: 59 | Source: Ambulatory Visit | Attending: Orthopaedic Surgery | Admitting: Orthopaedic Surgery

## 2022-09-30 DIAGNOSIS — M25412 Effusion, left shoulder: Secondary | ICD-10-CM | POA: Diagnosis not present

## 2022-09-30 DIAGNOSIS — M75102 Unspecified rotator cuff tear or rupture of left shoulder, not specified as traumatic: Secondary | ICD-10-CM | POA: Diagnosis not present

## 2022-09-30 DIAGNOSIS — M19012 Primary osteoarthritis, left shoulder: Secondary | ICD-10-CM | POA: Diagnosis not present

## 2022-09-30 DIAGNOSIS — Z01818 Encounter for other preprocedural examination: Secondary | ICD-10-CM | POA: Diagnosis not present

## 2022-10-01 ENCOUNTER — Encounter: Payer: Self-pay | Admitting: Family Medicine

## 2022-10-06 ENCOUNTER — Other Ambulatory Visit: Payer: Self-pay | Admitting: Family Medicine

## 2022-10-06 DIAGNOSIS — Z1231 Encounter for screening mammogram for malignant neoplasm of breast: Secondary | ICD-10-CM

## 2022-10-06 DIAGNOSIS — S46012S Strain of muscle(s) and tendon(s) of the rotator cuff of left shoulder, sequela: Secondary | ICD-10-CM

## 2022-10-06 DIAGNOSIS — E559 Vitamin D deficiency, unspecified: Secondary | ICD-10-CM

## 2022-10-06 DIAGNOSIS — E1140 Type 2 diabetes mellitus with diabetic neuropathy, unspecified: Secondary | ICD-10-CM

## 2022-10-06 DIAGNOSIS — W540XXA Bitten by dog, initial encounter: Secondary | ICD-10-CM

## 2022-10-06 DIAGNOSIS — E538 Deficiency of other specified B group vitamins: Secondary | ICD-10-CM

## 2022-10-06 DIAGNOSIS — E1169 Type 2 diabetes mellitus with other specified complication: Secondary | ICD-10-CM

## 2022-10-06 DIAGNOSIS — R3915 Urgency of urination: Secondary | ICD-10-CM

## 2022-10-06 DIAGNOSIS — E039 Hypothyroidism, unspecified: Secondary | ICD-10-CM

## 2022-10-06 DIAGNOSIS — E1159 Type 2 diabetes mellitus with other circulatory complications: Secondary | ICD-10-CM

## 2022-10-06 DIAGNOSIS — G894 Chronic pain syndrome: Secondary | ICD-10-CM

## 2022-10-06 DIAGNOSIS — I152 Hypertension secondary to endocrine disorders: Secondary | ICD-10-CM

## 2022-10-06 DIAGNOSIS — F339 Major depressive disorder, recurrent, unspecified: Secondary | ICD-10-CM

## 2022-10-06 DIAGNOSIS — Z23 Encounter for immunization: Secondary | ICD-10-CM

## 2022-10-14 ENCOUNTER — Other Ambulatory Visit: Payer: Self-pay | Admitting: Physical Medicine & Rehabilitation

## 2022-10-15 ENCOUNTER — Telehealth: Payer: Self-pay

## 2022-10-15 NOTE — Telephone Encounter (Signed)
Called patient to schedule Medicare Annual Wellness Visit (AWV). No voicemail available to leave a message.  Last date of AWV: 12/06/20  Please schedule an appointment at any time with [facility].   Norton Blizzard, [place] (AAMA)  [place] Program [phone]

## 2022-10-16 ENCOUNTER — Other Ambulatory Visit: Payer: Self-pay

## 2022-10-16 DIAGNOSIS — E1140 Type 2 diabetes mellitus with diabetic neuropathy, unspecified: Secondary | ICD-10-CM

## 2022-10-16 NOTE — Telephone Encounter (Signed)
Pt staed that she forgot to tell you that she can not take amoxicillian bc it gives her diarrhea but she is thru with the rx now.

## 2022-10-19 ENCOUNTER — Other Ambulatory Visit: Payer: Self-pay

## 2022-10-19 MED ORDER — TRESIBA FLEXTOUCH 200 UNIT/ML ~~LOC~~ SOPN: 35.0000 [IU] | PEN_INJECTOR | Freq: Two times a day (BID) | SUBCUTANEOUS | 5 refills | Status: DC

## 2022-10-19 NOTE — Addendum Note (Signed)
Addended by: Billey Chang on: 10/19/2022 05:12 PM   Modules accepted: Orders

## 2022-10-22 ENCOUNTER — Other Ambulatory Visit: Payer: Self-pay | Admitting: Family Medicine

## 2022-10-27 ENCOUNTER — Other Ambulatory Visit: Payer: Self-pay | Admitting: Family Medicine

## 2022-10-28 ENCOUNTER — Ambulatory Visit (INDEPENDENT_AMBULATORY_CARE_PROVIDER_SITE_OTHER): Payer: 59 | Admitting: Family Medicine

## 2022-10-28 ENCOUNTER — Encounter: Payer: Self-pay | Admitting: Family Medicine

## 2022-10-28 VITALS — BP 120/70 | HR 91 | Temp 98.5°F | Ht 61.0 in | Wt 132.8 lb

## 2022-10-28 DIAGNOSIS — E1140 Type 2 diabetes mellitus with diabetic neuropathy, unspecified: Secondary | ICD-10-CM | POA: Diagnosis not present

## 2022-10-28 DIAGNOSIS — R159 Full incontinence of feces: Secondary | ICD-10-CM | POA: Diagnosis not present

## 2022-10-28 DIAGNOSIS — Z794 Long term (current) use of insulin: Secondary | ICD-10-CM | POA: Diagnosis not present

## 2022-10-28 DIAGNOSIS — W540XXD Bitten by dog, subsequent encounter: Secondary | ICD-10-CM

## 2022-10-28 DIAGNOSIS — Z87828 Personal history of other (healed) physical injury and trauma: Secondary | ICD-10-CM

## 2022-10-28 LAB — BASIC METABOLIC PANEL
BUN: 15 mg/dL (ref 6–23)
CO2: 24 mEq/L (ref 19–32)
Calcium: 9.8 mg/dL (ref 8.4–10.5)
Chloride: 108 mEq/L (ref 96–112)
Creatinine, Ser: 0.87 mg/dL (ref 0.40–1.20)
GFR: 68.11 mL/min (ref >60.00)
Glucose, Bld: 103 mg/dL — ABNORMAL HIGH (ref 70–99)
Potassium: 4.6 mEq/L (ref 3.5–5.1)
Sodium: 141 mEq/L (ref 135–145)

## 2022-10-28 LAB — CBC WITH DIFFERENTIAL/PLATELET
Basophils Absolute: 0 10*3/uL (ref 0.0–0.1)
Basophils Relative: 0.6 % (ref 0.0–3.0)
Eosinophils Absolute: 0.2 10*3/uL (ref 0.0–0.7)
Eosinophils Relative: 2.9 % (ref 0.0–5.0)
HCT: 43.3 % (ref 36.0–46.0)
Hemoglobin: 13.6 g/dL (ref 12.0–15.0)
Lymphocytes Relative: 40.2 % (ref 12.0–46.0)
Lymphs Abs: 2.8 10*3/uL (ref 0.7–4.0)
MCHC: 31.4 g/dL (ref 30.0–36.0)
MCV: 87 fl (ref 78.0–100.0)
Monocytes Absolute: 0.5 10*3/uL (ref 0.1–1.0)
Monocytes Relative: 7.8 % (ref 3.0–12.0)
Neutro Abs: 3.4 10*3/uL (ref 1.4–7.7)
Neutrophils Relative %: 48.5 % (ref 43.0–77.0)
Platelets: 328 10*3/uL (ref 150.0–400.0)
RBC: 4.98 Mil/uL (ref 3.87–5.11)
RDW: 15.5 % (ref 11.5–15.5)
WBC: 7 10*3/uL (ref 4.0–10.5)

## 2022-10-28 NOTE — Care Plan (Signed)
Ortho Bundle Case Management Note  Patient Details  Name: Robin Morgan MRN: [id number] Date of Birth: 1956-04-05    spoke with patient. states that she will discharge to home with friends to assist. no DME needed. OPPT set up with [employer] discharge instructions discussed and questions answered. OPPT will call her with appointment time                 DME Arranged:    DME Agency:     HH Arranged:    [place] Agency:     Additional Comments: Please contact me with any questions of if this plan should need to change.  Ladell Heads,  [facility] [facility]  [phone] 10/28/2022, 12:53 PM

## 2022-10-28 NOTE — Progress Notes (Signed)
Subjective  CC:  Chief Complaint  Patient presents with   Diarrhea    Pt stated that she has been having diarrhea since 09/28/2022    HPI: Robin Morgan is a 67 y.o. female who presents to the office today to address the problems listed above in the chief complaint. 67 year old who is here on February 19 for diabetic recheck and preop clearance and a dog bite.  She was treated with Augmentin and started to have loose stools within 24 hours after starting it.  Since then, she reports fecal incontinence of soft brown stool.  Decree sensation.  No urgency symptoms.  No watery stool.  No fevers chills or myalgias.  She completed the Augmentin.  She has a history of opioid-induced constipation and prior to augmenting was taking Movantik.  She has not had any of that since our visit.  She does use magnesium and metformin.  She has no abdominal pain.  She does not feel sick.  Her last colonoscopy was in 2017.  Appetite is fine.  No blood in the stool.  She does see [facility].  Not having pain or fevers.  No history of recurrent diverticulitis Diabetes: She reports she does not have symptoms of hypoglycemia.  No polyuria.  No urinary symptoms Dog bite: Completed Augmentin for 7 days and wound is well-healed.  Assessment  1. Full incontinence of feces   2. Controlled type 2 diabetes mellitus with diabetic neuropathy, with long-term current use of insulin ([place])   3. Dog bite, subsequent encounter      Plan  Fecal incontinence: Reports mildly improved in the last 48 hours.  No red flag symptoms.  Need to rule out C. difficile and infectious causes given recent antibiotic use.  Likely multifactorial.  Recommend holding metformin and magnesium.  She has stopped the Movantik.  Use Imodium cautiously and if not improving within the next week, get back to GI for further evaluation and possible endoscopy.  Patient understands and agrees.  Check CBC and electrolytes Diabetes: Stable by recent A1c.  No  symptoms of hypoglycemia or infection Dog bite resolved.  No residual infection present.  And then  Follow up: As scheduled 12/28/2022  Orders Placed This Encounter  Procedures   Clostridium difficile Toxin B, Qualitative, Real-Time PCR   Gastrointestinal Pathogen Pnl RT, PCR   Basic metabolic panel   CBC with Differential/Platelet   No orders of the defined types were placed in this encounter.     I reviewed the patients updated PMH, FH, and SocHx.    Patient Active Problem List   Diagnosis Date Noted   Diabetic peripheral neuropathy associated with type 2 diabetes mellitus ([place]) 01/06/2017    Priority: High   OSA (obstructive sleep apnea) 03/10/2016    Priority: High   Controlled diabetes mellitus with diabetic neuropathy, with long-term current use of insulin ([place]) 10/11/2014    Priority: High   Hypertension associated with diabetes ([place]) 10/11/2014    Priority: High   Combined hyperlipidemia associated with type 2 diabetes mellitus ([place]) 10/11/2014    Priority: High   DDD (degenerative disc disease), lumbar 01/15/2014    Priority: High   Hypothyroidism, acquired 06/12/2013    Priority: High   Cervical post-laminectomy syndrome 05/10/2013    Priority: High   Chronic pain associated with significant psychosocial dysfunction 01/03/2010    Priority: High   Esophageal stricture     Priority: Medium    Tenosynovitis, wrist 10/07/2017    Priority: Medium  Trigger thumb of left hand 10/07/2017    Priority: Medium    Laryngopharyngeal reflux (LPR) 08/26/2017    Priority: Medium    Dysphagia 08/26/2017    Priority: Medium    Left carpal tunnel syndrome 03/08/2017    Priority: Medium    Fibromyalgia 02/12/2016    Priority: Medium    Osteoporosis 12/01/2012    Priority: Medium    Chronic diarrhea 07/04/2012    Priority: Medium    Traumatic tear of left rotator cuff 09/28/2022   Vitamin D deficiency 09/28/2022   Vitamin B12 deficiency 09/28/2022   Urinary urgency  09/28/2022   Rotator cuff syndrome, left 12/11/2020   Migraine without aura, intractable, without status migrainosus 07/31/2020   Cervical disc disorder with radiculopathy of cervical region 07/31/2020   Biceps tendinitis, right 02/07/2020   Mid back pain 12/06/2019   Insomnia due to medical condition 12/01/2019   Chronic prescription opiate use 09/02/2018   Myofascial muscle pain 10/28/2011   Depression, recurrent ([place]) 10/28/2011   Current Meds  Medication Sig   atorvastatin (LIPITOR) 10 MG tablet 1 tablet Orally Once a day   co-enzyme Q-10 30 MG capsule Take 30 mg by mouth at bedtime.    cyclobenzaprine (FLEXERIL) 10 MG tablet one tablet   cycloSPORINE (RESTASIS) 0.05 % ophthalmic emulsion 1 drop into affected eye Ophthalmic Twice a day   dapagliflozin propanediol (FARXIGA) 10 MG TABS tablet TAKE ONE TABLET BY MOUTH ONCE A DAY (NEED OFFICE VISIT FOR REFILLS)   diclofenac Sodium (VOLTAREN) 1 % GEL    DULoxetine (CYMBALTA) 30 MG capsule TAKE 3 CAPSULES BY MOUTH DAILY   esomeprazole (NEXIUM) 40 MG capsule 1 capsule Orally Once a day   fenofibrate 160 MG tablet TAKE 1 TABLET BY MOUTH ONCE A DAY   gabapentin (NEURONTIN) 300 MG capsule TAKE 2 CAPSULES BY MOUTH IN THE MORNING,2 CAPSULES AT LUNCH AND 2 CAPSULES AT BEDTIME   glipiZIDE (GLUCOTROL) 5 MG tablet 1 tablet Orally Once a day   insulin degludec (TRESIBA FLEXTOUCH) 200 UNIT/ML FlexTouch Pen Inject 36 Units into the skin 2 (two) times daily.   levothyroxine (SYNTHROID) 50 MCG tablet 1 tablet Orally Once a day   lisinopril (ZESTRIL) 5 MG tablet TAKE 1 TABLETS BY MOUTH DAILY   meloxicam (MOBIC) 7.5 MG tablet 1 tablet   metFORMIN (GLUCOPHAGE) 1000 MG tablet TAKE 1 TABLET BY MOUTH TWICE A DAY WITH MEALS   morphine (MSIR) 15 MG tablet TAKE ONE TABLET BY MOUTH THREE TIMES DAILY AS NEEDED FOR MODERATE PAIN.   Omega-3 Fatty Acids (FISH OIL) 1000 MG CAPS 1 capsule   pravastatin (PRAVACHOL) 20 MG tablet 1 tablet Orally Once a day   RESTASIS  0.05 % ophthalmic emulsion Place 1 drop into both eyes daily.    tiZANidine (ZANAFLEX) 2 MG tablet TAKE 1/2 TO 1 TABLET (1-2 MG TOTAL) BY MOUTH EVERY 6 HOURS AS NEEDED FOR MUSCLE SPASMS   topiramate (TOPAMAX) 100 MG tablet Take 1 tablet (100 mg total) by mouth at bedtime.   zolpidem (AMBIEN) 10 MG tablet Take 1 tablet by mouth at bedtime as needed.   [DISCONTINUED] magnesium gluconate (MAGONATE) 500 MG tablet Take 1 tablet (500 mg total) by mouth daily.   [DISCONTINUED] MOVANTIK 25 MG TABS tablet TAKE 1 TABLET BY MOUTH ONCE DAILY    Allergies: Patient is allergic to compazine, prochlorperazine, emetrol, other, prochlorperazine edisylate, prochlorperazine maleate, and naproxen. Family History: Patient family history includes Alzheimer's disease in her mother; Breast cancer (age of onset: 4) in  her paternal grandmother; Diabetes in her father; Heart disease in her maternal grandmother. Social History:  Patient  reports that she has never smoked. She has never used smokeless tobacco. She reports that she does not drink alcohol and does not use drugs.  Review of Systems: Constitutional: Negative for fever malaise or anorexia Cardiovascular: negative for chest pain Respiratory: negative for SOB or persistent cough Gastrointestinal: negative for abdominal pain  Objective  Vitals: BP 120/70   Pulse 91   Temp 98.5 F (36.9 C)   Ht 5\' 1"  (1.549 m)   Wt 132 lb 12.8 oz (60.2 kg)   SpO2 95%   BMI 25.09 kg/m  General: no acute distress , A&Ox3 HEENT: PEERL, conjunctiva normal, neck is supple Cardiovascular:  RRR without murmur or gallop.  Respiratory:  Good breath sounds bilaterally, CTAB with normal respiratory effort Abdomen: Soft, minimal left lower quadrant tenderness without rebound guarding or masses.  No apparent splenomegaly.  Normal bowel sounds Skin:  Warm, no rashes, well-healed dog bite on left lower shin  Commons side effects, risks, benefits, and alternatives for medications  and treatment plan prescribed today were discussed, and the patient expressed understanding of the given instructions. Patient is instructed to call or message via MyChart if he/she has any questions or concerns regarding our treatment plan. No barriers to understanding were identified. We discussed Red Flag symptoms and signs in detail. Patient expressed understanding regarding what to do in case of urgent or emergency type symptoms.  Medication list was reconciled, printed and provided to the patient in AVS. Patient instructions and summary information was reviewed with the patient as documented in the AVS. This note was prepared with assistance of Dragon voice recognition software. Occasional wrong-word or sound-a-like substitutions may have occurred due to the inherent limitations of voice recognition software

## 2022-10-28 NOTE — Patient Instructions (Signed)
Please follow up as scheduled for your next visit with me: 12/28/2022   If you have any questions or concerns, please don't hesitate to send me a message via MyChart or call the office at [phone]. Thank you for visiting with Robin Morgan today! It's our pleasure caring for you.   Please hold your metformin, movantik and magnesium until you are doing better.  I will check for infection. Take otc imodium as needed and contact your GI doctor if things persist.   Fecal Incontinence Fecal incontinence, also called accidental bowel leakage, is an inability to control your bowels. This means stool (feces) leaks from the rectum. This condition happens because the nerves or muscles around the anus do not work the way they should. The anus is the opening between the buttocks. What are the causes? This condition may be caused by: Damage to the muscles at the end of the rectum (sphincter). Damage to the nerves that control bowel movements. Diarrhea. Chronic constipation. Pelvic floor dysfunction. This means the muscles in the pelvis do not work well. Loss of bowel storage capacity. This occurs when the rectum can no longer stretch in size in order to store feces. Inflammatory bowel disease (IBD), such as Crohn's disease. Irritable bowel syndrome (IBS). What increases the risk? You are more likely to develop this condition if: You were born with bowels or a pelvis that did not form correctly. You have had rectal surgery. You have had radiation treatment for certain cancers. You have been pregnant, had a vaginal delivery, or had surgery that damaged the pelvic floor muscles. You have nerve damage that affects the rectum or anus: You had a complicated childbirth, spinal cord injury, or other trauma that caused nerve damage. You have a condition that can affect nerve function, such as diabetes, Parkinson's disease, or multiple sclerosis (MS). You have a condition where the rectum drops down into the anus or  vagina (prolapse). You are 22 years of age or older. What are the signs or symptoms? The main symptom of this condition is not being able to control your bowels. You also might not be able to get to the bathroom before a bowel movement. Other symptoms may include: Diarrhea. Constipation. Discomfort in the abdomen. How is this diagnosed? This condition is diagnosed with a medical history and physical exam. You may also have other tests, including: Blood and urine tests. A rectal exam. Ultrasound or MRI. Defecography. This is a test that uses a special liquid to see how the rectum and anus perform during a bowel movement. Colonoscopy. This is an exam that looks at your large intestine (colon). Anal manometry. This is a test that measures the strength of the anal sphincter. Anal electromyogram (EMG). This is a test that uses small electrodes to check for nerve damage. How is this treated? Treatment for this condition depends on the cause and severity. Treatment may also focus on addressing any underlying causes of this condition. Treatment may include: Medicines, such as medicines to: Prevent diarrhea. Help with constipation (bulk-forming laxatives). Treat any underlying conditions. Biofeedback therapy. This can help retrain muscles that are affected. Fiber supplements. These can help manage your bowel movements. Nerve stimulation. An injectable gel to promote tissue growth and better muscle control. Surgery. You may need: Sphincter repair surgery. Diversion surgery. This procedure lets feces pass out of your body through a hole in your abdomen. Follow these instructions at home: Eating and drinking  Follow instructions from your health care provider about what you may eat  and drink. Work with a dietitian to help you avoid foods and drinks that can make your condition worse. These include: Spicy foods. Fatty and greasy foods. Cured or smoked meat. Dairy products. Drinks with  caffeine in them. Keep a diet diary to find out which foods or drinks could be making your condition worse. Do not drink alcohol. Drink enough fluid to keep your urine pale yellow. Lifestyle Do not use any products that contain nicotine or tobacco. These products include cigarettes, chewing tobacco, and vaping devices, such as e-cigarettes. If you need help quitting, ask your health care provider. If you are overweight, talk with your health care provider about how to safely lose weight. This may help your condition. Increase your physical activity as told by your health care provider. This may help your condition. Always talk with your health care provider before starting a new exercise program. Carry a change of clothes and supplies to clean up quickly if you have an episode of fecal incontinence. Consider joining a fecal incontinence support group. You can find a support group online or in your local community. General instructions  Take over-the-counter and prescription medicines only as told by your health care provider. This includes any supplements. Apply a moisture barrier, such as petroleum jelly, to the anus. This protects the skin from irritation caused by ongoing leaking or diarrhea. Tell your health care provider if you are upset or depressed about your condition. Where to find more information [employer] Gastrointestinal [employer]: iffgd.[place] of Gastroenterology: patients.gi.org Contact a health care provider if: You have a fever. You have redness, swelling, or pain around your anus. Your pain is getting worse or you lose feeling in your rectal area. You have blood in your stool. You feel sad or hopeless. You avoid social or work situations. Get help right away if: You stop having bowel movements. You cannot eat or drink without vomiting. You have rectal bleeding that does not stop. You have severe pain that is getting  worse. You have symptoms of dehydration, including: Drowsiness or tiredness (fatigue). Little or no urine, tears, or sweat. Dizziness. Dry mouth. Unusual irritability. Headache. Inability to think clearly. Summary Fecal incontinence, also called accidental bowel leakage, is an inability to control your bowels. This condition happens because the nerves or muscles around the anus do not work the way they should. Treatment depends on the cause and the severity of the symptoms. Follow instructions from your health care provider about what you may eat and drink, lifestyle changes, and skin care. Take over-the-counter and prescription medicines only as told by your health care provider. Tell your health care provider if your symptoms worsen or if you are upset or depressed about your condition. This information is not intended to replace advice given to you by your health care provider. Make sure you discuss any questions you have with your health care provider. Document Revised: 10/21/2021 Document Reviewed: 10/21/2021 Elsevier Patient Education  [place].

## 2022-10-29 ENCOUNTER — Encounter: Payer: 59 | Attending: Registered Nurse | Admitting: Registered Nurse

## 2022-10-29 ENCOUNTER — Other Ambulatory Visit: Payer: 59

## 2022-10-29 ENCOUNTER — Encounter: Payer: Self-pay | Admitting: Registered Nurse

## 2022-10-29 ENCOUNTER — Other Ambulatory Visit: Payer: Self-pay

## 2022-10-29 VITALS — BP 116/72 | HR 99 | Ht 61.0 in | Wt 131.0 lb

## 2022-10-29 DIAGNOSIS — G43019 Migraine without aura, intractable, without status migrainosus: Secondary | ICD-10-CM

## 2022-10-29 DIAGNOSIS — M546 Pain in thoracic spine: Secondary | ICD-10-CM | POA: Diagnosis not present

## 2022-10-29 DIAGNOSIS — M7918 Myalgia, other site: Secondary | ICD-10-CM | POA: Diagnosis not present

## 2022-10-29 DIAGNOSIS — M5010 Cervical disc disorder with radiculopathy, unspecified cervical region: Secondary | ICD-10-CM | POA: Insufficient documentation

## 2022-10-29 DIAGNOSIS — Z5181 Encounter for therapeutic drug level monitoring: Secondary | ICD-10-CM | POA: Diagnosis not present

## 2022-10-29 DIAGNOSIS — Z79899 Other long term (current) drug therapy: Secondary | ICD-10-CM | POA: Insufficient documentation

## 2022-10-29 DIAGNOSIS — M75102 Unspecified rotator cuff tear or rupture of left shoulder, not specified as traumatic: Secondary | ICD-10-CM | POA: Diagnosis not present

## 2022-10-29 DIAGNOSIS — M5412 Radiculopathy, cervical region: Secondary | ICD-10-CM | POA: Insufficient documentation

## 2022-10-29 DIAGNOSIS — M5136 Other intervertebral disc degeneration, lumbar region: Secondary | ICD-10-CM | POA: Insufficient documentation

## 2022-10-29 DIAGNOSIS — G894 Chronic pain syndrome: Secondary | ICD-10-CM | POA: Diagnosis not present

## 2022-10-29 DIAGNOSIS — M797 Fibromyalgia: Secondary | ICD-10-CM | POA: Diagnosis not present

## 2022-10-29 DIAGNOSIS — M542 Cervicalgia: Secondary | ICD-10-CM | POA: Diagnosis not present

## 2022-10-29 DIAGNOSIS — G8929 Other chronic pain: Secondary | ICD-10-CM | POA: Diagnosis not present

## 2022-10-29 DIAGNOSIS — R159 Full incontinence of feces: Secondary | ICD-10-CM

## 2022-10-29 MED ORDER — MORPHINE SULFATE 15 MG PO TABS: ORAL_TABLET | ORAL | 0 refills | Status: DC

## 2022-10-29 NOTE — H&P (Signed)
PREOPERATIVE H&P  Chief Complaint: djd left shoulder  HPI: Robin Morgan is a 67 y.o. female who is scheduled for Procedure(s): REVERSE SHOULDER ARTHROPLASTY.   Patient has a past medical history significant for GERD, fibromyalgia, HLD, HTN.   She is a 67 year old who has had left shoulder pain for the last year. She has been seeing a pain management doctor who sent her to Dr. Ronnald Ramp for concerns for her spine.  She continues to have symptoms in her shoulder. She has trouble with over head activities.  She needs to use her arms early for care of herself. She is on Morphine 15 mg two to three times a day, managed by her pain management doctor.  Symptoms are rated as moderate to severe, and have been worsening.  This is significantly impairing activities of daily living.    Please see clinic note for further details on this patient's care.    She has elected for surgical management.   Past Medical History:  Diagnosis Date   Calcific tendonitis    Cervical spondylosis without myelopathy    Depression    Diabetes mellitus    Esophageal stricture    Fibromyalgia    GERD (gastroesophageal reflux disease)    Hiatal hernia    Hyperlipidemia    Hypertension    Past Surgical History:  Procedure Laterality Date   ABDOMINAL HYSTERECTOMY     1986   APPENDECTOMY     1983   BREAST EXCISIONAL BIOPSY Right 1990's   BREAST SURGERY     rt breast cyst done in the 24's   [place]     right   SPINE SURGERY     TONSILLECTOMY     1973   Social History   Socioeconomic History   Marital status: Divorced    Spouse name: Not on file   Number of children: 1   Years of education: Not on file   Highest education level: Not on file  Occupational History   Occupation: disabled  Tobacco Use   Smoking status: Never   Smokeless tobacco: Never  Vaping Use   Vaping Use: Never used  Substance and Sexual Activity   Alcohol  use: No   Drug use: No   Sexual activity: Not Currently  Other Topics Concern   Not on file  Social History Narrative   Not on file   Social Determinants of Health   Financial Resource Strain: Low Risk  (12/06/2020)   Overall Financial Resource Strain (CARDIA)    Difficulty of Paying Living Expenses: Not hard at all  Food Insecurity: No Food Insecurity (12/06/2020)   Hunger Vital Sign    Worried About Running Out of Food in the Last Year: Never true    [place] in the Last Year: Never true  Transportation Needs: No Transportation Needs (12/06/2020)   PRAPARE - Hydrologist (Medical): No    Lack of Transportation (Non-Medical): No  Physical Activity: Inactive (12/06/2020)   Exercise Vital Sign    Days of Exercise per Week: 0 days    Minutes of Exercise per Session: 0 min  Stress: No Stress Concern Present (12/06/2020)   [place]    Feeling of Stress : Only a little  Social Connections: Socially Isolated (12/06/2020)   Social Connection and Isolation Panel [NHANES]  Frequency of Communication with Friends and Family: Twice a week    Frequency of Social Gatherings with Friends and Family: Once a week    Attends Religious Services: Never    Marine scientist or Organizations: No    Attends Music therapist: Never    Marital Status: Divorced   Family History  Problem Relation Age of Onset   Alzheimer's disease Mother    Diabetes Father    Heart disease Maternal Grandmother    Breast cancer Paternal Grandmother 33   Allergies  Allergen Reactions   Compazine Other (See Comments)    Seizure-like reaction   Prochlorperazine Other (See Comments)    Other reaction(s): eye rolled back  Other Reaction(s): Unknown  Other Reaction(s): eye rolled back   Emetrol     Other reaction(s): GI upset   Other Other (See Comments)   Prochlorperazine Edisylate     Prochlorperazine Maleate    Naproxen Itching    Other reaction(s): itiching  Other Reaction(s): itiching   Prior to Admission medications   Medication Sig Start Date End Date Taking? Authorizing Provider  atorvastatin (LIPITOR) 10 MG tablet 1 tablet Orally Once a day    [provider]  Calcium Carbonate-Vitamin D (CALCIUM-VITAMIN D) 500-200 MG-UNIT per tablet Take 1 tablet by mouth 2 (two) times daily with a meal.    [provider]  Cholecalciferol (VITAMIN D3) 50 MCG (2000 UT) CAPS Take 2 capsules by mouth daily.    [provider]  co-enzyme Q-10 30 MG capsule Take 30 mg by mouth at bedtime.     [provider]  cyclobenzaprine (FLEXERIL) 10 MG tablet one tablet    [provider]  cycloSPORINE (RESTASIS) 0.05 % ophthalmic emulsion 1 drop into affected eye Ophthalmic Twice a day    [provider]  dapagliflozin propanediol (FARXIGA) 10 MG TABS tablet TAKE ONE TABLET BY MOUTH ONCE A DAY (NEED OFFICE VISIT FOR REFILLS) 10/28/22   Leamon Arnt, MD  diclofenac Sodium (VOLTAREN) 1 % GEL     [provider]  DULoxetine (CYMBALTA) 30 MG capsule TAKE 3 CAPSULES BY MOUTH DAILY 06/10/22   Leamon Arnt, MD  esomeprazole (NEXIUM) 40 MG capsule 1 capsule Orally Once a day    [provider]  fenofibrate 160 MG tablet TAKE 1 TABLET BY MOUTH ONCE A DAY 05/20/22   Leamon Arnt, MD  gabapentin (NEURONTIN) 300 MG capsule TAKE 2 CAPSULES BY MOUTH IN THE MORNING,2 CAPSULES AT LUNCH AND 2 CAPSULES AT BEDTIME 09/21/22   Bayard Hugger, NP  glipiZIDE (GLUCOTROL) 5 MG tablet 1 tablet Orally Once a day    [provider]  insulin degludec (TRESIBA FLEXTOUCH) 200 UNIT/ML FlexTouch Pen Inject 36 Units into the skin 2 (two) times daily. 10/19/22   Leamon Arnt, MD  levothyroxine (SYNTHROID) 50 MCG tablet 1 tablet Orally Once a day    [provider]  lisinopril (ZESTRIL) 5 MG tablet TAKE 1 TABLETS BY MOUTH DAILY  09/21/22   Leamon Arnt, MD  magnesium gluconate (MAGONATE) 500 MG tablet Take 500 mg by mouth 2 (two) times daily.    [provider]  meloxicam (MOBIC) 7.5 MG tablet 1 tablet    [provider]  metFORMIN (GLUCOPHAGE) 1000 MG tablet TAKE 1 TABLET BY MOUTH TWICE A DAY WITH MEALS 09/08/22   Leamon Arnt, MD  morphine (MSIR) 15 MG tablet TAKE ONE TABLET BY MOUTH THREE TIMES DAILY AS NEEDED FOR  MODERATE PAIN. 10/29/22   Bayard Hugger, NP  Omega-3 Fatty Acids (FISH OIL) 1000 MG CAPS 1 capsule    [provider]  pravastatin (PRAVACHOL) 20 MG tablet 1 tablet Orally Once a day    [provider]  RESTASIS 0.05 % ophthalmic emulsion Place 1 drop into both eyes daily.  11/30/19   [provider]  SUPER B COMPLEX/C PO Take by mouth.    [provider]  tiZANidine (ZANAFLEX) 2 MG tablet TAKE 1/2 TO 1 TABLET (1-2 MG TOTAL) BY MOUTH EVERY 6 HOURS AS NEEDED FOR MUSCLE SPASMS 09/02/22   Meredith Staggers, MD  topiramate (TOPAMAX) 100 MG tablet Take 1 tablet (100 mg total) by mouth at bedtime. 09/02/22 09/02/23  Meredith Staggers, MD  vitamin B-12 (CYANOCOBALAMIN) 1000 MCG tablet Take 1,000 mcg by mouth daily.    [provider]  zolpidem (AMBIEN) 10 MG tablet Take 1 tablet by mouth at bedtime as needed.    [provider]    ROS: All other systems have been reviewed and were otherwise negative with the exception of those mentioned in the HPI and as above.  Physical Exam: General: Alert, no acute distress Cardiovascular: No pedal edema Respiratory: No cyanosis, no use of accessory musculature GI: No organomegaly, abdomen is soft and non-tender Skin: No lesions in the area of chief complaint Neurologic: Sensation intact distally Psychiatric: Patient is competent for consent with normal mood and affect Lymphatic: No axillary or cervical lymphadenopathy  MUSCULOSKELETAL:  The range of motion of the left shoulder is to 90 degrees  with a firm end point. External rotation to 30, passive motion  to 100, internal rotation to the back pocket.  Imaging: MRI reviewed , suboptimal due to patient motion, however this demonstrates a large supraspinatus tear to the mid humeral head. The tissue quality itself appears to be extremely poor. There is no proximal migration.   BMI: Estimated body mass index is 24.75 kg/m as calculated from the following:   Height as of 10/29/22: 5\' 1"  (1.549 m).   Weight as of 10/29/22: 59.4 kg.  Lab Results  Component Value Date   ALBUMIN 4.3 09/28/2022   Diabetes:   Patient has a diagnosis of diabetes,  Lab Results  Component Value Date   HGBA1C 6.9 (H) 09/28/2022   Smoking Status:   reports that she has never smoked. She has never used smokeless tobacco.     Assessment: djd left shoulder  Plan: Plan for Procedure(s): REVERSE SHOULDER ARTHROPLASTY  The risks benefits and alternatives were discussed with the patient including but not limited to the risks of nonoperative treatment, versus surgical intervention including infection, bleeding, nerve injury,  blood clots, cardiopulmonary complications, morbidity, mortality, among others, and they were willing to proceed.   We additionally specifically discussed risks of axillary nerve injury, infection, periprosthetic fracture, continued pain and longevity of implants prior to beginning procedure.    Patient will be closely monitored in PACU for medical stabilization and pain control. If found stable in PACU, patient may be discharged home with outpatient follow-up. If any concerns regarding patient's stabilization patient will be admitted for observation after surgery. The patient is planning to be discharged home with outpatient PT.   The patient acknowledged the explanation, agreed to proceed with the plan and consent was signed.   She received operative clearance from her PCP, Dr. Billey Chang, and her pain management provider, Dr.  Naaman Plummer.   Operative Plan: Left reverse total shoulder arthroplasty  Discharge Medications: standard DVT Prophylaxis: aspirin Physical Therapy: outpatient PT Special Discharge needs: Sling (should bring with her to surgery). [place], PA-C  10/29/2022 2:14 PM

## 2022-10-29 NOTE — Progress Notes (Signed)
Subjective:    Patient ID: Robin Morgan, female    DOB: Mar 09, 1956, 67 y.o.   MRN: [id number]  [id number] Robin Morgan is a 67 y.o. female who returns for follow up appointment for chronic pain and medication refill. She states her pain is located in her neck radiating into her left shoulder and mid- lower back pain. She rates her pain 7. Her current exercise regime is walking and performing stretching exercises.  Robin Morgan is scheduled for : See below on 11/05/2022 REVERSE SHOULDER ARTHROPLASTY Left    Robin Morgan Morphine equivalent is 45.00 MME.   UDS was Ordered Today     Pain Inventory Average Pain 8 Pain Right Now 7 My pain is constant and aching  In the last 24 hours, has pain interfered with the following? General activity 7 Relation with others 9 Enjoyment of life 8 What TIME of day is your pain at its worst? daytime, evening, and night Sleep (in general) NA  Pain is worse with: walking and standing Pain improves with: rest and medication Relief from Meds: 8  Family History  Problem Relation Age of Onset   Alzheimer's disease Mother    Diabetes Father    Heart disease Maternal Grandmother    Breast cancer Paternal Grandmother 32   Social History   Socioeconomic History   Marital status: Divorced    Spouse name: Not on file   Number of children: 1   Years of education: Not on file   Highest education level: Not on file  Occupational History   Occupation: disabled  Tobacco Use   Smoking status: Never   Smokeless tobacco: Never  Vaping Use   Vaping Use: Never used  Substance and Sexual Activity   Alcohol use: No   Drug use: No   Sexual activity: Not Currently  Other Topics Concern   Not on file  Social History Narrative   Not on file   Social Determinants of Health   Financial Resource Strain: Low Risk  (12/06/2020)   Overall Financial Resource Strain (CARDIA)    Difficulty of Paying Living Expenses: Not hard at all  Food Insecurity: No Food  Insecurity (12/06/2020)   Hunger Vital Sign    Worried About Running Out of Food in the Last Year: Never true    Ran Out of Food in the Last Year: Never true  Transportation Needs: No Transportation Needs (12/06/2020)   PRAPARE - Hydrologist (Medical): No    Lack of Transportation (Non-Medical): No  Physical Activity: Inactive (12/06/2020)   Exercise Vital Sign    Days of Exercise per Week: 0 days    Minutes of Exercise per Session: 0 min  Stress: No Stress Concern Present (12/06/2020)   [place]    Feeling of Stress : Only a little  Social Connections: Socially Isolated (12/06/2020)   Social Connection and Isolation Panel [NHANES]    Frequency of Communication with Friends and Family: Twice a week    Frequency of Social Gatherings with Friends and Family: Once a week    Attends Religious Services: Never    Marine scientist or Organizations: No    Attends Archivist Meetings: Never    Marital Status: Divorced   Past Surgical History:  Procedure Laterality Date   [place] EXCISIONAL BIOPSY Right 1990's  BREAST SURGERY     rt breast cyst done in the 90's   [place]     right   [place]   Past Surgical History:  Procedure Laterality Date   ABDOMINAL HYSTERECTOMY     1986   APPENDECTOMY     1983   BREAST EXCISIONAL BIOPSY Right 1990's   BREAST SURGERY     rt breast cyst done in the 90's   [place]     right   SPINE SURGERY     TONSILLECTOMY     1973   Past Medical History:  Diagnosis Date   Calcific tendonitis    Cervical spondylosis without myelopathy    Depression    Diabetes mellitus    Esophageal stricture    Fibromyalgia    GERD  (gastroesophageal reflux disease)    Hiatal hernia    Hyperlipidemia    Hypertension    BP 116/72   Pulse 99   Ht 5\' 1"  (1.549 m)   Wt 131 lb (59.4 kg)   SpO2 98%   BMI 24.75 kg/m   Opioid Risk Score:   Fall Risk Score:  `1  Depression screen [facility] 2/9     10/29/2022    1:43 PM 10/28/2022   10:43 AM 09/28/2022    2:26 PM 09/02/2022    2:04 PM 04/29/2022    1:03 PM 04/03/2022   12:59 PM 01/29/2022    1:31 PM  Depression screen PHQ 2/9  Decreased Interest 0 0 2 1 1  0 1  Down, Depressed, Hopeless 0 0  1 1 0 1  PHQ - 2 Score 0 0 2 2 2  0 2  Altered sleeping  0 0      Tired, decreased energy  0 3      Change in appetite  0 2      Feeling bad or failure about yourself   0 0      Trouble concentrating  0 0      Moving slowly or fidgety/restless  0 0      Suicidal thoughts  0 0      PHQ-9 Score  0 7      Difficult doing work/chores  Not difficult at all Somewhat difficult         Review of Systems  Constitutional: Negative.   HENT: Negative.    Eyes: Negative.   Respiratory: Negative.    Cardiovascular: Negative.   Gastrointestinal:  Positive for diarrhea.  Endocrine: Negative.   Genitourinary: Negative.   Musculoskeletal:        Shoulder pain and spasms  Skin: Negative.   Allergic/Immunologic: Negative.   Neurological: Negative.   Hematological: Negative.   Psychiatric/Behavioral: Negative.    All other systems reviewed and are negative.      Objective:   Physical Exam Vitals and nursing note reviewed.  Constitutional:      Appearance: Normal appearance.  Cardiovascular:     Rate and Rhythm: Normal rate and regular rhythm.     Pulses: Normal pulses.     Heart sounds: Normal heart sounds.  Pulmonary:     Effort: Pulmonary effort is normal.     Breath sounds: Normal breath sounds.  Musculoskeletal:     Cervical back: Normal range  of motion and neck supple.     Comments: Normal Muscle Bulk and Muscle Testing Reveals:  Upper Extremities: Right: Full ROM and  Muscle Strength 5/5 Left Upper Extremity: Decreased ROM 45 Degrees and Muscle Strength 5/5 Left AC Joint Tenderness Thoracic Paraspinal Tenderness: T-7-T-9 Lower Extremities: Full ROM and Muscle Strength 5/5 Arises from Table with ease Narrow Based  Gait     Skin:    General: Skin is warm and dry.  Neurological:     Mental Status: She is alert and oriented to person, place, and time.  Psychiatric:        Mood and Affect: Mood normal.        Behavior: Behavior normal.         Assessment & Plan:  1. Fibromyalgia with myofascial pain: Continue home exercise program and heat therapy. Continue current medication regimen with Flexeril. 10/29/2022.  2. Rotator cuff syndrome on the right: Continue with stretching exercises and heat therapy. 10/29/2022 3. Cervicalgia. Post-laminectomy syndrome, facet arthropathy: Cervical Radiculopathy: Continue current medication regimen with Gabapentin. 10/29/2022  S/ P  EMG with Dr. Naaman Plummer on 03/08/2017: Diagnosed with Moderate to Severe CTS of Left wrist: 10/29/2022. 4. Depression: Continue current medication regimen with Cymbalta. PCP Following. Current  dose 90 mg daily. 10/29/2022 5. Mid/low back pain/ Lumbar Spondylosis: Continue Current Medication and stretching and heat therapy. 10/29/2022 Refilled: Morphine Sulfate IR 15 mg one tablet three times  a day as needed for pain # 80.  6. Lumbar Radiculopathy:  Continue current medication regimen with Gabapentin. 10/29/2022 7. OA of right hand: Continue Voltaren gel/ May substitute with Voltaren Tablet, she realizes she can't use both and verbalizes understanding. Continue with  heat therapy. 10/29/2022 8. Migraines: Continue with headache Journal.  Continue to Monitor  10/29/2022. 9. Left CTS:S/P EMG on 03/08/2017: S/P Carpal Tunnel Release on 06/14/2017 via . Dr. Apolonio Schneiders. 10/29/2022 10.. Muscle Spasm: Continue Tizanidine. Continue to Monitor. 10/29/2022 11. Left Shoulder Pain: Ortho Following: She is  scheduled for surgery on 11/05/2022.Continue HEP as Tolerated. Continue to Monitor. 10/29/2022  12. Fall at Home: No falls since last visit. Continue to Monitor. 10/29/2022      F/U in 1 month

## 2022-10-29 NOTE — Progress Notes (Addendum)
Informed Dr. Rich Fuchs PA, Chrys Racer about patients recent PCP visits and pending test for C.Diff. She will inform Dr. Griffin Basil and let me know our plan for patients DOS.   Update: spoke with Dr. Griffin Basil and patient's case will be delayed.

## 2022-10-30 ENCOUNTER — Encounter: Payer: 59 | Admitting: Registered Nurse

## 2022-10-30 LAB — CLOSTRIDIUM DIFFICILE TOXIN B, QUALITATIVE, REAL-TIME PCR: Toxigenic C. Difficile by PCR: DETECTED — AB

## 2022-10-30 MED ORDER — METRONIDAZOLE 500 MG PO TABS: 500.0000 mg | ORAL_TABLET | Freq: Three times a day (TID) | ORAL | 0 refills | Status: DC

## 2022-10-30 NOTE — Progress Notes (Signed)
Please call patient: her stool test shows she has an infection in her colon called C.diff colitis and needs antibiotics to clear it up.  I have sent in antibiotics.  She can follow up with her GI doctor if things do not improve, but they should.

## 2022-10-31 LAB — GASTROINTESTINAL PATHOGEN PNL
CampyloBacter Group: NOT DETECTED
Norovirus GI/GII: NOT DETECTED
Rotavirus A: NOT DETECTED
Salmonella species: NOT DETECTED
Shiga Toxin 1: NOT DETECTED
Shiga Toxin 2: NOT DETECTED
Shigella Species: NOT DETECTED
Vibrio Group: NOT DETECTED
Yersinia enterocolitica: NOT DETECTED

## 2022-11-02 ENCOUNTER — Telehealth: Payer: Self-pay | Admitting: Family Medicine

## 2022-11-02 NOTE — Telephone Encounter (Signed)
Copied from [place] [id number]. Topic: Medicare AWV >> Nov 02, 2022 11:55 AM Gillis Santa wrote: Reason for CRM: Called patient to schedule Medicare Annual Wellness Visit (AWV). Left message for patient to call back and schedule Medicare Annual Wellness Visit (AWV).  Last date of AWV: 12/06/2020  Please schedule an appointment at any time with Otila Kluver, [facility]. Please schedule AWVS with Otila Kluver, [place].  If any questions, please contact me at [phone].  Thank you ,  Shaune Pollack [facility] AWV TEAM Direct Dial [phone]

## 2022-11-03 ENCOUNTER — Encounter: Payer: Self-pay | Admitting: Psychology

## 2022-11-03 LAB — TOXASSURE SELECT,+ANTIDEPR,UR

## 2022-11-03 NOTE — Progress Notes (Signed)
Neuropsychology Visit  Patient:  Robin Morgan   DOB: May 02, 1956  MR Number: [id number]  Location: [place] [facility] [place], [place] [id number] [place] [address] Dept: [phone]  Date of Service: 09/01/2022  Start: 2 PM End: 3 PM  Duration of Service: 1 Hour  Today's visit was conducted in my outpatient clinic office with the patient myself present.  Today's visit was an in person visit.  Provider/Observer:     Edgardo Roys PsyD  Chief Complaint:      Chief Complaint  Patient presents with   Anxiety   Depression   Pain   Back Pain   Neck Pain    Reason For Service:     Robin Morgan is 67 year old right-handed female referred by Dr. Naaman Plummer for neuropsychological/psychological consultation due to significant depression and anxiety with primary stressors related to family issues in the setting of fibromyalgia and chronic severe pain symptoms.  The patient reports that she has had significant pain in her back primarily mid back for 20 years or more.  The patient is also been diagnosed with fibromyalgia.  The patient had surgery on her neck to try to alleviate help with her severe recurrent headaches but experience little change post surgery.  The patient also has significant shoulder pain and severe headache with these pain and headaches resulting in her having to spend multiple days at a time essentially in bed.  The patient is continued to struggle with anxiety and depressive symptoms on top of her chronic pain disorder.  The patient was asked to help take care of her father who has dementia and her sisters expected her to stay there for an extended period of time.  The patient was there for almost a week experiencing increasing problems due to poor sleeping conditions and an exacerbation of her underlying chronic pain symptoms.  There continues to be stressors  and animosity between the patient and her siblings.  There continues to be a lot of stress around what is happening with the care of her father and the relationship dynamics between the patient and her 2 sisters.  The patient herself has significant pain and is unable to physically help much with her father but particularly her younger sister is demanding and manipulative of the patient to try to be able to do more.  The patient's description of the younger sister present a picture of a very manipulative narcissistic like behavioral pattern in the younger sister and it is very difficult for her and the 2 other sisters to engage and have the younger sister react appropriately.  Treatment Interventions:  Therapeutic interventions for issues related to chronic pain/fibromyalgia and recurrent headaches and depression anxiety symptoms with significant psychosocial stressors.  Participation Level:   Active  Participation Quality:  Appropriate and Attentive      Behavioral Observation:  Well Groomed, Alert, and Appropriate.   Current Psychosocial Factors: The patient continues to have a lot of stress around her father's deteriorating health and one of her sisters that likely has significant personality disorder that causes a great deal of stress within the family structure.  The patient is doing as much as she can to help with her father but her physical limitations play significant negative role in her capacity.  Content of Session:   Reviewed current symptoms and continue to develop coping skills around her chronic pain and fibromyalgia symptoms as well as headaches  and dealing with cervical postlaminectomy syndrome.  We also worked on significant psychosocial stressors that are being impacted by the patient's limited physical functioning as well as her stressors having a significant deleterious on her overall pain symptoms.  Effectiveness of Interventions: The patient has been quite open and active in  these therapeutic processes and rapport been easily established.  The patient's cognitive function is good and while stress affects her ability to stay focused this is not indicative of any type of cognitive deficits with the patient.  The patient is already working on some of the initial issues we have focused around sleep hygiene, physical activity and other coping issues around her chronic pain symptoms.  Target Goals:   Goals include building better coping and adaptive skills around her chronic pain symptoms and working on better pain management as well as working on significant psychosocial stressors that are negatively impacting her current medical issues.  Goals Last Reviewed:   09/01/2022  Goals Addressed Today:    Today we worked a lot on the various stressors in her life that are exacerbating her chronic pain symptoms.  Impression/Diagnosis:   Robin Morgan is a 67 year old right-handed female referred by Dr. Naaman Plummer for neuropsychological/psychological consultation due to significant depression and anxiety with primary stressors related to family issues in the setting of fibromyalgia and chronic severe pain symptoms.  The patient reports that she has had significant pain in her back primarily mid back for 20 years or more.  The patient is also been diagnosed with fibromyalgia.  The patient had surgery on her neck to try to alleviate help with her severe recurrent headaches but experience little change post surgery.  The patient also has significant shoulder pain and severe headache with these pain and headaches resulting in her having to spend multiple days at a time essentially in bed.   I do think that the patient's anxiety, stress and depressive symptoms do play and exacerbating role in her overall pain symptoms and while she clearly has abnormalities in lumbar and cervical regions as a primary factor for her pain symptoms her stress responses, fibromyalgia and depression do play a role in  the acute day-to-day level of her pain.  Today we have continue to work on therapeutic interventions around chronic pain and building better coping strategies for the numerous stressors that she is facing particularly around issues with her family and the impact that stress has on her chronic pain difficulties.  Diagnosis:   Depression, recurrent (HCC)  Chronic pain syndrome  Fibromyalgia    Ilean Skill, Psy.D. Clinical Psychologist Neuropsychologist

## 2022-11-05 ENCOUNTER — Encounter (HOSPITAL_BASED_OUTPATIENT_CLINIC_OR_DEPARTMENT_OTHER): Payer: Self-pay

## 2022-11-05 ENCOUNTER — Ambulatory Visit (HOSPITAL_BASED_OUTPATIENT_CLINIC_OR_DEPARTMENT_OTHER): Admit: 2022-11-05 | Payer: 59 | Admitting: Orthopaedic Surgery

## 2022-11-05 SURGERY — ARTHROPLASTY, SHOULDER, TOTAL, REVERSE
Anesthesia: General | Site: Shoulder | Laterality: Left

## 2022-11-12 ENCOUNTER — Other Ambulatory Visit: Payer: Self-pay | Admitting: Physical Medicine & Rehabilitation

## 2022-11-12 DIAGNOSIS — G43019 Migraine without aura, intractable, without status migrainosus: Secondary | ICD-10-CM

## 2022-11-18 ENCOUNTER — Telehealth: Payer: Self-pay | Admitting: Family Medicine

## 2022-11-18 ENCOUNTER — Telehealth: Payer: Self-pay | Admitting: *Deleted

## 2022-11-18 ENCOUNTER — Ambulatory Visit: Payer: 59

## 2022-11-18 NOTE — Telephone Encounter (Signed)
Patient states PCP informed her that following completion on abx from 10/27/21 OV to come back and have labs rechecked. States that she will need these labs done in order to be rescheduled for her surgery with Dr. Everardo Pacific. Please Advise.

## 2022-11-18 NOTE — Telephone Encounter (Signed)
Urine drug screen for this encounter is consistent for prescribed medication 

## 2022-11-19 ENCOUNTER — Encounter: Payer: Self-pay | Admitting: Family Medicine

## 2022-11-19 NOTE — Telephone Encounter (Signed)
Surgical clearance has been faxed over to [facility] and pt has been made aware.

## 2022-12-03 ENCOUNTER — Encounter: Payer: Self-pay | Admitting: Family Medicine

## 2022-12-03 ENCOUNTER — Ambulatory Visit (INDEPENDENT_AMBULATORY_CARE_PROVIDER_SITE_OTHER): Payer: 59 | Admitting: Family Medicine

## 2022-12-03 VITALS — BP 120/72 | HR 90 | Temp 97.0°F | Ht 61.0 in | Wt 130.4 lb

## 2022-12-03 DIAGNOSIS — A0472 Enterocolitis due to Clostridium difficile, not specified as recurrent: Secondary | ICD-10-CM | POA: Diagnosis not present

## 2022-12-03 DIAGNOSIS — K222 Esophageal obstruction: Secondary | ICD-10-CM

## 2022-12-03 DIAGNOSIS — K219 Gastro-esophageal reflux disease without esophagitis: Secondary | ICD-10-CM | POA: Diagnosis not present

## 2022-12-03 DIAGNOSIS — S46012S Strain of muscle(s) and tendon(s) of the rotator cuff of left shoulder, sequela: Secondary | ICD-10-CM

## 2022-12-03 MED ORDER — VANCOMYCIN HCL 125 MG PO CAPS: 125.0000 mg | ORAL_CAPSULE | Freq: Four times a day (QID) | ORAL | 0 refills | Status: AC

## 2022-12-03 NOTE — Progress Notes (Signed)
Subjective  CC:  Chief Complaint  Patient presents with   GI Problem    Ongoing bowel incontinence that has eased up in the last week but not resolved.    HPI: Robin Morgan is a 67 y.o. female who presents to the office today to address the problems listed above in the chief complaint. C.diff colitis: Patient completed 10 days of Flagyl.  Symptoms have mildly improved but have not resolved.  Still with loose bowel movements, malodorous, and occasional abdominal cramping.  No fevers, chills.  Blood work was unremarkable. Chronic LPR and GERD with history of esophageal strictures: She had seen Dr. Madilyn Fireman at [facility] for many years but he is now retired.  She is no longer happy with that medical practice.  She is requesting referral to GI for follow-up of her chronic GI related problems. Opioid-induced constipation: No longer on Movantik due to diarrhea. Left shoulder repair has been placed on hold due to her current colitis.  Assessment  1. C. difficile colitis   2. Laryngopharyngeal reflux (LPR)   3. Esophageal stricture   4. Traumatic tear of left rotator cuff, unspecified tear extent, sequela      Plan  C. difficile colitis: Metronidazole failure.  Changed to vancomycin 125 mg p.o. 4 times daily for 10 days.  Imodium as needed. LPR and history of esophageal stricture: Continue current medications.  Refer to GI.  Patient request [place] Rotator cuff tear: Surgery on hold.  Once cleared from GI, can resume scheduling  Follow up: For complete physical as scheduled 12/28/2022  Orders Placed This Encounter  Procedures   Ambulatory referral to Gastroenterology   Meds ordered this encounter  Medications   vancomycin (VANCOCIN) 125 MG capsule    Sig: Take 1 capsule (125 mg total) by mouth 4 (four) times daily for 10 days.    Dispense:  40 capsule    Refill:  0      I reviewed the patients updated PMH, FH, and SocHx.    Patient Active Problem List   Diagnosis Date Noted    Diabetic peripheral neuropathy associated with type 2 diabetes mellitus 01/06/2017    Priority: High   OSA (obstructive sleep apnea) 03/10/2016    Priority: High   Controlled diabetes mellitus with diabetic neuropathy, with long-term current use of insulin 10/11/2014    Priority: High   Hypertension associated with diabetes 10/11/2014    Priority: High   Combined hyperlipidemia associated with type 2 diabetes mellitus 10/11/2014    Priority: High   DDD (degenerative disc disease), lumbar 01/15/2014    Priority: High   Hypothyroidism, acquired 06/12/2013    Priority: High   Cervical post-laminectomy syndrome 05/10/2013    Priority: High   Chronic pain associated with significant psychosocial dysfunction 01/03/2010    Priority: High   Esophageal stricture     Priority: Medium    Tenosynovitis, wrist 10/07/2017    Priority: Medium    Trigger thumb of left hand 10/07/2017    Priority: Medium    Laryngopharyngeal reflux (LPR) 08/26/2017    Priority: Medium    Dysphagia 08/26/2017    Priority: Medium    Left carpal tunnel syndrome 03/08/2017    Priority: Medium    Fibromyalgia 02/12/2016    Priority: Medium    Osteoporosis 12/01/2012    Priority: Medium    Chronic diarrhea 07/04/2012    Priority: Medium    Traumatic tear of left rotator cuff 09/28/2022   Vitamin D deficiency 09/28/2022  Vitamin B12 deficiency 09/28/2022   Urinary urgency 09/28/2022   Rotator cuff syndrome, left 12/11/2020   Migraine without aura, intractable, without status migrainosus 07/31/2020   Cervical disc disorder with radiculopathy of cervical region 07/31/2020   Biceps tendinitis, right 02/07/2020   Mid back pain 12/06/2019   Insomnia due to medical condition 12/01/2019   Chronic prescription opiate use 09/02/2018   Myofascial muscle pain 10/28/2011   Depression, recurrent 10/28/2011   Current Meds  Medication Sig   atorvastatin (LIPITOR) 10 MG tablet 1 tablet Orally Once a day   Calcium  Carbonate-Vitamin D (CALCIUM-VITAMIN D) 500-200 MG-UNIT per tablet Take 1 tablet by mouth 2 (two) times daily with a meal.   carboxymethylcellul-glycerin (OPTIVE) 0.5-0.9 % ophthalmic solution Apply to eye.   Cholecalciferol (VITAMIN D3) 50 MCG (2000 UT) CAPS Take 2 capsules by mouth daily.   co-enzyme Q-10 30 MG capsule Take 30 mg by mouth at bedtime.    cyclobenzaprine (FLEXERIL) 10 MG tablet one tablet   cycloSPORINE (RESTASIS) 0.05 % ophthalmic emulsion 1 drop into affected eye Ophthalmic Twice a day   dapagliflozin propanediol (FARXIGA) 10 MG TABS tablet TAKE ONE TABLET BY MOUTH ONCE A DAY (NEED OFFICE VISIT FOR REFILLS)   diclofenac Sodium (VOLTAREN) 1 % GEL    DULoxetine (CYMBALTA) 30 MG capsule TAKE 3 CAPSULES BY MOUTH DAILY   esomeprazole (NEXIUM) 40 MG capsule 1 capsule Orally Once a day   fenofibrate 160 MG tablet TAKE 1 TABLET BY MOUTH ONCE A DAY   gabapentin (NEURONTIN) 300 MG capsule TAKE 2 CAPSULES BY MOUTH IN THE MORNING,2 CAPSULES AT LUNCH AND 2 CAPSULES AT BEDTIME   glipiZIDE (GLUCOTROL) 5 MG tablet 1 tablet Orally Once a day   insulin degludec (TRESIBA FLEXTOUCH) 200 UNIT/ML FlexTouch Pen Inject 36 Units into the skin 2 (two) times daily.   levothyroxine (SYNTHROID) 50 MCG tablet 1 tablet Orally Once a day   lisinopril (ZESTRIL) 5 MG tablet TAKE 1 TABLETS BY MOUTH DAILY   magnesium gluconate (MAGONATE) 500 MG tablet Take 500 mg by mouth 2 (two) times daily.   meloxicam (MOBIC) 7.5 MG tablet 1 tablet   metFORMIN (GLUCOPHAGE) 1000 MG tablet TAKE 1 TABLET BY MOUTH TWICE A DAY WITH MEALS   morphine (MSIR) 15 MG tablet TAKE ONE TABLET BY MOUTH THREE TIMES DAILY AS NEEDED FOR MODERATE PAIN.   naloxegol oxalate (MOVANTIK) 25 MG TABS tablet Take by mouth.   Omega-3 Fatty Acids (FISH OIL) 1000 MG CAPS 1 capsule   pravastatin (PRAVACHOL) 20 MG tablet 1 tablet Orally Once a day   RESTASIS 0.05 % ophthalmic emulsion Place 1 drop into both eyes daily.    SUPER B COMPLEX/C PO Take by  mouth.   tiZANidine (ZANAFLEX) 2 MG tablet TAKE 1/2 TO 1 TABLET (1-2 MG TOTAL) BY MOUTH EVERY 6 HOURS AS NEEDED FOR MUSCLE SPASMS   topiramate (TOPAMAX) 100 MG tablet TAKE ONE TABLET BY MOUTH EVERY NIGHT AT BEDTIME   vancomycin (VANCOCIN) 125 MG capsule Take 1 capsule (125 mg total) by mouth 4 (four) times daily for 10 days.   vitamin B-12 (CYANOCOBALAMIN) 1000 MCG tablet Take 1,000 mcg by mouth daily.   zolpidem (AMBIEN) 10 MG tablet Take 1 tablet by mouth at bedtime as needed.   [DISCONTINUED] metroNIDAZOLE (FLAGYL) 500 MG tablet Take 1 tablet (500 mg total) by mouth 3 (three) times daily.    Allergies: Patient is allergic to compazine, prochlorperazine, emetrol, other, prochlorperazine edisylate, prochlorperazine maleate, and naproxen. Family History: Patient family  history includes Alzheimer's disease in her mother; Breast cancer (age of onset: 1) in her paternal grandmother; Diabetes in her father; Heart disease in her maternal grandmother. Social History:  Patient  reports that she has never smoked. She has never used smokeless tobacco. She reports that she does not drink alcohol and does not use drugs.  Review of Systems: Constitutional: Negative for fever malaise or anorexia Cardiovascular: negative for chest pain Respiratory: negative for SOB or persistent cough Gastrointestinal: negative for abdominal pain  Objective  Vitals: BP 120/72 (BP Location: Left Arm, Patient Position: Sitting)   Pulse 90   Temp (!) 97 F (36.1 C) (Temporal)   Ht  (1.549 m)   Wt 130 lb 6.4 oz (59.1 kg)   SpO2 96%   BMI 24.64 kg/m  General: no acute distress , A&Ox3 HEENT: PEERL, conjunctiva normal, neck is supple Cardiovascular:  RRR without murmur or gallop.  Respiratory:  Good breath sounds bilaterally, CTAB with normal respiratory effort Abdomen: Mild left lower quadrant tenderness without rebound guarding or masses.  Normal bowel sounds.  Mildly distended. Skin:  Warm, no rashes  Lab  Results  Component Value Date   WBC 7.0 10/28/2022   HGB 13.6 10/28/2022   HCT 43.3 10/28/2022   MCV 87.0 10/28/2022   PLT 328.0 10/28/2022   Lab Results  Component Value Date   ALT 15 09/28/2022   AST 23 09/28/2022   ALKPHOS 60 09/28/2022   BILITOT 0.4 09/28/2022   Lab Results  Component Value Date   CREATININE 0.87 10/28/2022   BUN 15 10/28/2022   NA 141 10/28/2022   K 4.6 10/28/2022   CL 108 10/28/2022   CO2 24 10/28/2022    Commons side effects, risks, benefits, and alternatives for medications and treatment plan prescribed today were discussed, and the patient expressed understanding of the given instructions. Patient is instructed to call or message via MyChart if he/she has any questions or concerns regarding our treatment plan. No barriers to understanding were identified. We discussed Red Flag symptoms and signs in detail. Patient expressed understanding regarding what to do in case of urgent or emergency type symptoms.  Medication list was reconciled, printed and provided to the patient in AVS. Patient instructions and summary information was reviewed with the patient as documented in the AVS. This note was prepared with assistance of Dragon voice recognition software. Occasional wrong-word or sound-a-like substitutions may have occurred due to the inherent limitations of voice recognition software

## 2022-12-03 NOTE — Patient Instructions (Signed)
Please follow up as scheduled for your next visit with me: 12/28/2022 for your physical.   We will call you to get you set up with GI.   Complete 10 days of the vancomycin.  You may use immodium if needed.  If you have any questions or concerns, please don't hesitate to send me a message via MyChart or call the office at [phone]. Thank you for visiting with [place] today! It's our pleasure caring for you.

## 2022-12-21 ENCOUNTER — Other Ambulatory Visit: Payer: Self-pay | Admitting: Physical Medicine & Rehabilitation

## 2022-12-21 DIAGNOSIS — G43019 Migraine without aura, intractable, without status migrainosus: Secondary | ICD-10-CM

## 2022-12-28 ENCOUNTER — Ambulatory Visit (INDEPENDENT_AMBULATORY_CARE_PROVIDER_SITE_OTHER): Payer: 59 | Admitting: Family Medicine

## 2022-12-28 ENCOUNTER — Encounter: Payer: Self-pay | Admitting: Family Medicine

## 2022-12-28 VITALS — BP 104/60 | HR 85 | Temp 98.3°F | Ht 61.0 in | Wt 130.0 lb

## 2022-12-28 DIAGNOSIS — E1142 Type 2 diabetes mellitus with diabetic polyneuropathy: Secondary | ICD-10-CM | POA: Diagnosis not present

## 2022-12-28 DIAGNOSIS — E782 Mixed hyperlipidemia: Secondary | ICD-10-CM

## 2022-12-28 DIAGNOSIS — E1140 Type 2 diabetes mellitus with diabetic neuropathy, unspecified: Secondary | ICD-10-CM

## 2022-12-28 DIAGNOSIS — A0472 Enterocolitis due to Clostridium difficile, not specified as recurrent: Secondary | ICD-10-CM | POA: Diagnosis not present

## 2022-12-28 DIAGNOSIS — E039 Hypothyroidism, unspecified: Secondary | ICD-10-CM | POA: Diagnosis not present

## 2022-12-28 DIAGNOSIS — I152 Hypertension secondary to endocrine disorders: Secondary | ICD-10-CM | POA: Diagnosis not present

## 2022-12-28 DIAGNOSIS — Z0000 Encounter for general adult medical examination without abnormal findings: Secondary | ICD-10-CM

## 2022-12-28 DIAGNOSIS — Z1231 Encounter for screening mammogram for malignant neoplasm of breast: Secondary | ICD-10-CM

## 2022-12-28 DIAGNOSIS — F339 Major depressive disorder, recurrent, unspecified: Secondary | ICD-10-CM | POA: Diagnosis not present

## 2022-12-28 DIAGNOSIS — Z780 Asymptomatic menopausal state: Secondary | ICD-10-CM

## 2022-12-28 DIAGNOSIS — E1169 Type 2 diabetes mellitus with other specified complication: Secondary | ICD-10-CM

## 2022-12-28 DIAGNOSIS — E1159 Type 2 diabetes mellitus with other circulatory complications: Secondary | ICD-10-CM

## 2022-12-28 DIAGNOSIS — G894 Chronic pain syndrome: Secondary | ICD-10-CM

## 2022-12-28 DIAGNOSIS — Z794 Long term (current) use of insulin: Secondary | ICD-10-CM

## 2022-12-28 DIAGNOSIS — M797 Fibromyalgia: Secondary | ICD-10-CM

## 2022-12-28 DIAGNOSIS — K219 Gastro-esophageal reflux disease without esophagitis: Secondary | ICD-10-CM

## 2022-12-28 NOTE — Patient Instructions (Addendum)
Please return in 3 months to recheck diabetes and blood pressure    Please set up an appointment for a diabetic eye exam and have the results sent to me.   I will release your lab results to you on your MyChart account with further instructions. You may see the results before I do, but when I review them I will send you a message with my report or have my assistant call you if things need to be discussed. Please reply to my message with any questions. Thank you!   Please call the office checked below to schedule your appointment for your mammogram and/or bone density screen (the checked studies were ordered): [x]   Mammogram  [x]   Bone Density  [x]   [facility]     [address] [place], [place]        [phone]         []   [facility]  [address] [place], [place]  [phone]   If you have any questions or concerns, please don't hesitate to send me a message via MyChart or call the office at [phone]. Thank you for visiting with [place] today! It's our pleasure caring for you.

## 2022-12-28 NOTE — Progress Notes (Signed)
Subjective  Chief Complaint  Patient presents with   Annual Exam    Pt here for Annual Exam and is not currently fasting     HPI: Robin Morgan is a 68 y.o. female who presents to [facility] [place] today for a Female Wellness Visit. She also has the concerns and/or needs as listed above in the chief complaint. These will be addressed in addition to the Health Maintenance Visit.   Wellness Visit: annual visit with health maintenance review and exam without Pap  Health maintenance: Patient is due for mammogram and will be due for bone density.  She needs to schedule.  Overdue for eye exam, no history of retinopathy.  Immunizations are current  Chronic disease f/u and/or acute problem visit: (deemed necessary to be done in addition to the wellness visit): C. difficile colitis: Completed course of vancomycin says symptoms are much improved.  Still with some abdominal tenderness but no longer with diarrhea.  Stools soft and formed.  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 Diabetic control remains good.  No symptoms of hyperglycemia. Reviewed labs from February, hyperlipidemia at goal on Crestor.  Hypothyroidism stable on levothyroxine.  Blood pressure is controlled. Mood continues to be her problem mainly due to family stressors.  Continues on high-dose Cymbalta for this and chronic pain  Assessment  1. Annual physical exam   2. C. difficile colitis   3. Chronic pain associated with significant psychosocial dysfunction   4. Controlled type 2 diabetes mellitus with diabetic neuropathy, with long-term current use of insulin (HCC)   5. Combined hyperlipidemia associated with type 2 diabetes mellitus (HCC)   6. Hypothyroidism, acquired   7. Diabetic peripheral neuropathy associated with type 2 diabetes mellitus (HCC)   8. Hypertension associated with diabetes  (HCC)   9. Laryngopharyngeal reflux (LPR)   10. Fibromyalgia   11. Screening mammogram for breast cancer   12. Asymptomatic menopausal state   13. Depression, recurrent [facility])      Plan  Female Wellness Visit: Age appropriate Health Maintenance and Prevention measures were discussed with patient. Included topics are cancer screening recommendations, ways to keep healthy (see AVS) including dietary and exercise recommendations, regular eye and dental care, use of seat belts, and avoidance of moderate alcohol use and tobacco use.  BMI: discussed patient's BMI and encouraged positive lifestyle modifications to help get to or maintain a target BMI. HM needs and immunizations were addressed and ordered. See below for orders. See HM and immunization section for updates. Routine labs and screening tests ordered including cmp, cbc and lipids where appropriate. Discussed recommendations regarding Vit D and calcium supplementation (see AVS)  Chronic disease management visit and/or acute problem visit: Complicated medical history.  Lab work shows most chronic medical conditions are well-controlled.  No changes in medications today. Follow-up with GI to get medical clearance/surgical clearance after course of C. difficile. Discussed mood and counseling done.  Complicated home life.  Continue Cymbalta Recheck CBC and CMP after diarrhea due to C. difficile. Follow up: 3 months to recheck diabetes.   Orders Placed This Encounter  Procedures   DG Bone Density   CBC with Differential/Platelet   Comprehensive metabolic panel   No orders of the defined types were placed in this encounter.     Body mass index is 24.56 kg/m. Wt Readings from Last 3 Encounters:  12/28/22 130 lb (  59 kg)  12/03/22 130 lb 6.4 oz (59.1 kg)  10/29/22 131 lb (59.4 kg)     Patient Active Problem List   Diagnosis Date Noted   Diabetic peripheral neuropathy associated with type 2 diabetes mellitus (HCC) 01/06/2017     Priority: High   OSA (obstructive sleep apnea) 03/10/2016    Priority: High   Controlled diabetes mellitus with diabetic neuropathy, with long-term current use of insulin (HCC) 10/11/2014    Priority: High   Hypertension associated with diabetes (HCC) 10/11/2014    Priority: High   Combined hyperlipidemia associated with type 2 diabetes mellitus (HCC) 10/11/2014    Priority: High   DDD (degenerative disc disease), lumbar 01/15/2014    Priority: High   Hypothyroidism, acquired 06/12/2013    Priority: High   Cervical post-laminectomy syndrome 05/10/2013    Priority: High   Chronic pain associated with significant psychosocial dysfunction 01/03/2010    Priority: High    Chronic Pain Syndrome, Dr. Guadalupe Dawn     Esophageal stricture     Priority: Medium    Tenosynovitis, wrist 10/07/2017    Priority: Medium    Trigger thumb of left hand 10/07/2017    Priority: Medium    Laryngopharyngeal reflux (LPR) 08/26/2017    Priority: Medium    Dysphagia 08/26/2017    Priority: Medium    Left carpal tunnel syndrome 03/08/2017    Priority: Medium    Fibromyalgia 02/12/2016    Priority: Medium    Osteoporosis 12/01/2012    Priority: Medium     01/2021: osteoporosis T= -2.7 wrist. Spine excluded. Offer tx.  10/2017: osteopenia: T = -1.9; stable. Recheck 2 years 4/ 2014 T = -1.2 at hip, -1.8 at spine    Chronic diarrhea 07/04/2012    Priority: Medium     Overview:  ? Related to gallbladder surgery. Has been evaluated by Dr. Madilyn Fireman GI. Lomotil as needed.    Traumatic tear of left rotator cuff 09/28/2022   Vitamin D deficiency 09/28/2022   Vitamin B12 deficiency 09/28/2022   Urinary urgency 09/28/2022   Rotator cuff syndrome, left 12/11/2020   Migraine without aura, intractable, without status migrainosus 07/31/2020   Cervical disc disorder with radiculopathy of cervical region 07/31/2020   Biceps tendinitis, right 02/07/2020   Mid back pain 12/06/2019   Insomnia due to medical  condition 12/01/2019   Chronic prescription opiate use 09/02/2018   Myofascial muscle pain 10/28/2011   Depression, recurrent (HCC) 10/28/2011   Health Maintenance  Topic Date Due   Zoster Vaccines- Shingrix (1 of 2) Never done   MAMMOGRAM  06/19/2021   Medicare Annual Wellness (AWV)  12/06/2021   OPHTHALMOLOGY EXAM  04/22/2022   FOOT EXAM  09/01/2022   COVID-19 Vaccine (4 - 2023-24 season) 01/13/2023 (Originally 04/10/2022)   DEXA SCAN  01/16/2023   INFLUENZA VACCINE  03/11/2023   HEMOGLOBIN A1C  03/29/2023   Diabetic kidney evaluation - Urine ACR  09/29/2023   Diabetic kidney evaluation - eGFR measurement  10/28/2023   COLONOSCOPY (Pts 45-2yrs Insurance coverage will need to be confirmed)  11/19/2025   DTaP/Tdap/Td (3 - Td or Tdap) 09/28/2032   Pneumonia Vaccine 35+ Years old  Completed   Hepatitis C Screening  Completed   HPV VACCINES  Aged Out   Immunization History  Administered Date(s) Administered   Fluad Quad(high Dose 65+) 04/06/2019, 09/01/2021, 09/28/2022   Influenza Split 06/04/2009, 05/02/2012   Influenza, High Dose Seasonal PF 05/13/2020   Influenza, Quadrivalent, Recombinant, Inj, Pf 05/12/2016, 07/07/2017  Influenza, Seasonal, Injecte, Preservative Fre 06/06/2014, 04/24/2015   Influenza,inj,Quad PF,6+ Mos 05/12/2016, 07/07/2017, 05/05/2018   Influenza-Unspecified 09/03/2011, 04/19/2013   PFIZER(Purple Top)SARS-COV-2 Vaccination 10/03/2019, 10/24/2019, 06/28/2020   Pneumococcal Conjugate-13 12/01/2019   Pneumococcal Polysaccharide-23 08/29/2009, 10/19/2018   Tdap 05/11/2012, 09/28/2022   We updated and reviewed the patient's past history in detail and it is documented below. Allergies: Patient is allergic to compazine, prochlorperazine, emetrol, other, prochlorperazine edisylate, prochlorperazine maleate, and naproxen. Past Medical History Patient  has a past medical history of Calcific tendonitis, Cervical spondylosis without myelopathy, Depression, Diabetes  mellitus, Esophageal stricture, Fibromyalgia, GERD (gastroesophageal reflux disease), Hiatal hernia, Hyperlipidemia, and Hypertension. Past Surgical History Patient  has a past surgical history that includes Spine surgery; Cholecystectomy; Tonsillectomy; Rotator cuff repair; Breast excisional biopsy (Right, 1990's); Gallbladder surgery; Breast surgery; Appendectomy; and Abdominal hysterectomy. Family History: Patient family history includes Alzheimer's disease in her mother; Breast cancer (age of onset: 73) in her paternal grandmother; Diabetes in her father; Heart disease in her maternal grandmother. Social History:  Patient  reports that she has never smoked. She has never used smokeless tobacco. She reports that she does not drink alcohol and does not use drugs.  Review of Systems: Constitutional: negative for fever or malaise Ophthalmic: negative for photophobia, double vision or loss of vision Cardiovascular: negative for chest pain, dyspnea on exertion, or new LE swelling Respiratory: negative for SOB or persistent cough Gastrointestinal: negative for abdominal pain, change in bowel habits or melena Genitourinary: negative for dysuria or gross hematuria, no abnormal uterine bleeding or disharge Musculoskeletal: negative for new gait disturbance or muscular weakness Integumentary: negative for new or persistent rashes, no breast lumps Neurological: negative for TIA or stroke symptoms Psychiatric: negative for SI or delusions Allergic/Immunologic: negative for hives  Patient Care Team    Relationship Specialty Notifications Start End  Willow Ora, MD PCP - General Family Medicine  11/09/17   Bradly Bienenstock, MD Consulting Physician Orthopedic Surgery  11/09/17   Ranelle Oyster, MD Consulting Physician Physical Medicine and Rehabilitation  11/09/17   Aquilla Hacker, PA-C Physician Assistant Otolaryngology  11/09/17   Dohmeier, Porfirio Mylar, MD Consulting Physician Neurology  11/09/17   Willis Modena, MD Consulting Physician Gastroenterology  11/23/18   Dimitri Ped, MD Consulting Physician Ophthalmology  12/01/19   Erroll Luna, [facility] Pharmacist Pharmacist  05/26/21    Comment: [phone]  Bjorn Pippin, MD Consulting Physician Orthopedic Surgery  09/28/22     Objective  Vitals: BP 104/60   Pulse 85   Temp 98.3 F (36.8 C)   Ht 5\' 1"  (1.549 m)   Wt 130 lb (59 kg)   SpO2 95%   BMI 24.56 kg/m  General:  Well developed, well nourished, no acute distress  Psych:  Alert and orientedx3,normal mood and affect HEENT:  Normocephalic, atraumatic, non-icteric sclera,  supple neck without adenopathy, mass or thyromegaly Cardiovascular:  Normal S1, S2, RRR without gallop, rub or murmur Respiratory:  Good breath sounds bilaterally, CTAB with normal respiratory effort Gastrointestinal: normal bowel sounds, soft, non-tender, no noted masses. No HSM MSK: extremities without edema, joints without erythema or swelling Neurologic:    Mental status is normal.  Gross motor and sensory exams are normal.  No tremor  Lab Results  Component Value Date   HGBA1C 6.9 (H) 09/28/2022   Lab Results  Component Value Date   WBC 7.0 10/28/2022   HGB 13.6 10/28/2022   HCT 43.3 10/28/2022   MCV 87.0 10/28/2022   PLT 328.0  10/28/2022   Lab Results  Component Value Date   CREATININE 0.87 10/28/2022   BUN 15 10/28/2022   NA 141 10/28/2022   K 4.6 10/28/2022   CL 108 10/28/2022   CO2 24 10/28/2022   Lab Results  Component Value Date   CHOL 134 09/28/2022   HDL 43.50 09/28/2022   LDLCALC 58 09/28/2022   TRIG 160.0 (H) 09/28/2022   CHOLHDL 3 09/28/2022   Lab Results  Component Value Date   TSH 1.25 09/28/2022    Commons side effects, risks, benefits, and alternatives for medications and treatment plan prescribed today were discussed, and the patient expressed understanding of the given instructions. Patient is instructed to call or message via MyChart if he/she has any  questions or concerns regarding our treatment plan. No barriers to understanding were identified. We discussed Red Flag symptoms and signs in detail. Patient expressed understanding regarding what to do in case of urgent or emergency type symptoms.  Medication list was reconciled, printed and provided to the patient in AVS. Patient instructions and summary information was reviewed with the patient as documented in the AVS. This note was prepared with assistance of Dragon voice recognition software. Occasional wrong-word or sound-a-like substitutions may have occurred due to the inherent limitations of voice recognition software

## 2022-12-29 ENCOUNTER — Encounter: Payer: Self-pay | Admitting: Registered Nurse

## 2022-12-29 ENCOUNTER — Encounter: Payer: Self-pay | Admitting: Family Medicine

## 2022-12-29 ENCOUNTER — Encounter: Payer: 59 | Attending: Registered Nurse | Admitting: Registered Nurse

## 2022-12-29 VITALS — BP 99/62 | HR 100 | Ht 61.0 in | Wt 130.0 lb

## 2022-12-29 DIAGNOSIS — M7918 Myalgia, other site: Secondary | ICD-10-CM | POA: Diagnosis not present

## 2022-12-29 DIAGNOSIS — G894 Chronic pain syndrome: Secondary | ICD-10-CM | POA: Diagnosis not present

## 2022-12-29 DIAGNOSIS — M5136 Other intervertebral disc degeneration, lumbar region: Secondary | ICD-10-CM

## 2022-12-29 DIAGNOSIS — Z794 Long term (current) use of insulin: Secondary | ICD-10-CM

## 2022-12-29 DIAGNOSIS — M5010 Cervical disc disorder with radiculopathy, unspecified cervical region: Secondary | ICD-10-CM | POA: Diagnosis not present

## 2022-12-29 DIAGNOSIS — Z79899 Other long term (current) drug therapy: Secondary | ICD-10-CM

## 2022-12-29 DIAGNOSIS — M542 Cervicalgia: Secondary | ICD-10-CM | POA: Diagnosis not present

## 2022-12-29 DIAGNOSIS — M75102 Unspecified rotator cuff tear or rupture of left shoulder, not specified as traumatic: Secondary | ICD-10-CM

## 2022-12-29 DIAGNOSIS — M797 Fibromyalgia: Secondary | ICD-10-CM | POA: Insufficient documentation

## 2022-12-29 DIAGNOSIS — E1142 Type 2 diabetes mellitus with diabetic polyneuropathy: Secondary | ICD-10-CM | POA: Diagnosis not present

## 2022-12-29 DIAGNOSIS — M5412 Radiculopathy, cervical region: Secondary | ICD-10-CM | POA: Diagnosis not present

## 2022-12-29 DIAGNOSIS — Z5181 Encounter for therapeutic drug level monitoring: Secondary | ICD-10-CM | POA: Diagnosis not present

## 2022-12-29 LAB — COMPREHENSIVE METABOLIC PANEL
ALT: 14 U/L (ref 0–35)
AST: 23 U/L (ref 0–37)
Albumin: 4.4 g/dL (ref 3.5–5.2)
Alkaline Phosphatase: 58 U/L (ref 39–117)
BUN: 14 mg/dL (ref 6–23)
CO2: 26 mEq/L (ref 19–32)
Calcium: 10 mg/dL (ref 8.4–10.5)
Chloride: 105 mEq/L (ref 96–112)
Creatinine, Ser: 0.88 mg/dL (ref 0.40–1.20)
GFR: 67.11 mL/min (ref >60.00)
Glucose, Bld: 130 mg/dL — ABNORMAL HIGH (ref 70–99)
Potassium: 4.4 mEq/L (ref 3.5–5.1)
Sodium: 141 mEq/L (ref 135–145)
Total Bilirubin: 0.6 mg/dL (ref 0.2–1.2)
Total Protein: 7 g/dL (ref 6.0–8.3)

## 2022-12-29 LAB — CBC WITH DIFFERENTIAL/PLATELET
Basophils Absolute: 0.1 10*3/uL (ref 0.0–0.1)
Basophils Relative: 0.8 % (ref 0.0–3.0)
Eosinophils Absolute: 0.2 10*3/uL (ref 0.0–0.7)
Eosinophils Relative: 2.1 % (ref 0.0–5.0)
HCT: 43.6 % (ref 36.0–46.0)
Hemoglobin: 13.5 g/dL (ref 12.0–15.0)
Lymphocytes Relative: 31.3 % (ref 12.0–46.0)
Lymphs Abs: 3.1 10*3/uL (ref 0.7–4.0)
MCHC: 31.1 g/dL (ref 30.0–36.0)
MCV: 87.5 fl (ref 78.0–100.0)
Monocytes Absolute: 0.7 10*3/uL (ref 0.1–1.0)
Monocytes Relative: 6.8 % (ref 3.0–12.0)
Neutro Abs: 5.9 10*3/uL (ref 1.4–7.7)
Neutrophils Relative %: 59 % (ref 43.0–77.0)
Platelets: 378 10*3/uL (ref 150.0–400.0)
RBC: 4.98 Mil/uL (ref 3.87–5.11)
RDW: 15.7 % — ABNORMAL HIGH (ref 11.5–15.5)
WBC: 10 10*3/uL (ref 4.0–10.5)

## 2022-12-29 NOTE — Progress Notes (Signed)
Subjective:    Patient ID: Robin Morgan, female    DOB: 27-Oct-1955, 67 y.o.   MRN: [id number]  HPI: Robin Morgan is a 67 y.o. female who returns for follow up appointment for chronic pain and medication refill. She states her pain is located in her neck radiating into her left shoulder, lower back pain and bilateral feet pain with tingling and burning L>R. She rates her pain 8. Her current exercise regime is walking and performing stretching exercises.  Ms. Marik Morphine equivalent is 44.44 MME.   Last UDS was Performed on 10/29/2022, it was consistent.    Pain Inventory Average Pain 9 Pain Right Now 8 My pain is constant, sharp, stabbing, and aching  In the last 24 hours, has pain interfered with the following? General activity 8 Relation with others 8 Enjoyment of life 8 What TIME of day is your pain at its worst? daytime, evening, and night Sleep (in general) Fair  Pain is worse with: bending, sitting, and standing Pain improves with: rest and medication Relief from Meds: 7  Family History  Problem Relation Age of Onset   Alzheimer's disease Mother    Diabetes Father    Heart disease Maternal Grandmother    Breast cancer Paternal Grandmother 32   Social History   Socioeconomic History   Marital status: Divorced    Spouse name: Not on file   Number of children: 1   Years of education: Not on file   Highest education level: Not on file  Occupational History   Occupation: disabled  Tobacco Use   Smoking status: Never   Smokeless tobacco: Never  Vaping Use   Vaping Use: Never used  Substance and Sexual Activity   Alcohol use: No   Drug use: No   Sexual activity: Not Currently  Other Topics Concern   Not on file  Social History Narrative   Not on file   Social Determinants of Health   Financial Resource Strain: Low Risk  (12/06/2020)   Overall Financial Resource Strain (CARDIA)    Difficulty of Paying Living Expenses: Not hard at all  Food  Insecurity: No Food Insecurity (12/06/2020)   Hunger Vital Sign    Worried About Running Out of Food in the Last Year: Never true    Ran Out of Food in the Last Year: Never true  Transportation Needs: No Transportation Needs (12/06/2020)   PRAPARE - Administrator, Civil Service (Medical): No    Lack of Transportation (Non-Medical): No  Physical Activity: Inactive (12/06/2020)   Exercise Vital Sign    Days of Exercise per Week: 0 days    Minutes of Exercise per Session: 0 min  Stress: No Stress Concern Present (12/06/2020)   [employer] of Occupational Health - Occupational Stress Questionnaire    Feeling of Stress : Only a little  Social Connections: Socially Isolated (12/06/2020)   Social Connection and Isolation Panel [NHANES]    Frequency of Communication with Friends and Family: Twice a week    Frequency of Social Gatherings with Friends and Family: Once a week    Attends Religious Services: Never    Database administrator or Organizations: No    Attends Banker Meetings: Never    Marital Status: Divorced   Past Surgical History:  Procedure Laterality Date   ABDOMINAL HYSTERECTOMY     1986   APPENDECTOMY     1983   BREAST EXCISIONAL BIOPSY Right 1990's   BREAST SURGERY  rt breast cyst done in the 90's   CHOLECYSTECTOMY     GALLBLADDER SURGERY     1983   ROTATOR CUFF REPAIR     right   SPINE SURGERY     TONSILLECTOMY     1973   Past Surgical History:  Procedure Laterality Date   ABDOMINAL HYSTERECTOMY     1986   APPENDECTOMY     1983   BREAST EXCISIONAL BIOPSY Right 1990's   BREAST SURGERY     rt breast cyst done in the 90's   CHOLECYSTECTOMY     GALLBLADDER SURGERY     1983   ROTATOR CUFF REPAIR     right   SPINE SURGERY     TONSILLECTOMY     1973   Past Medical History:  Diagnosis Date   Calcific tendonitis    Cervical spondylosis without myelopathy    Depression    Diabetes mellitus    Esophageal stricture     Fibromyalgia    GERD (gastroesophageal reflux disease)    Hiatal hernia    Hyperlipidemia    Hypertension    BP 99/62   Pulse 100   Ht 5\' 1"  (1.549 m)   Wt 130 lb (59 kg)   SpO2 97%   BMI 24.56 kg/m   Opioid Risk Score:   Fall Risk Score:  `1  Depression screen PHQ 2/9     12/29/2022    1:25 PM 12/28/2022    1:59 PM 12/03/2022    1:08 PM 10/29/2022    1:43 PM 10/28/2022   10:43 AM 09/28/2022    2:26 PM 09/02/2022    2:04 PM  Depression screen PHQ 2/9  Decreased Interest 1 2 2  0 0 2 1  Down, Depressed, Hopeless 1 2 1  0 0  1  PHQ - 2 Score 2 4 3  0 0 2 2  Altered sleeping  1 2  0 0   Tired, decreased energy  3 2  0 3   Change in appetite  2 3  0 2   Feeling bad or failure about yourself   1 1  0 0   Trouble concentrating  1 1  0 0   Moving slowly or fidgety/restless  0 0  0 0   Suicidal thoughts  0 0  0 0   PHQ-9 Score  12 12  0 7   Difficult doing work/chores  Somewhat difficult Very difficult  Not difficult at all Somewhat difficult     Review of Systems  Musculoskeletal:  Positive for back pain and neck pain.       Pain in both legs  All other systems reviewed and are negative.     Objective:   Physical Exam Vitals and nursing note reviewed.  Constitutional:      Appearance: Normal appearance.  Cardiovascular:     Rate and Rhythm: Normal rate and regular rhythm.     Pulses: Normal pulses.     Heart sounds: Normal heart sounds.  Pulmonary:     Effort: Pulmonary effort is normal.     Breath sounds: Normal breath sounds.  Musculoskeletal:     Cervical back: Normal range of motion and neck supple.     Comments: Normal Muscle Bulk and Muscle Testing Reveals:  Upper Extremities: Full ROM and Muscle Strength 5/5  Lumbar Paraspinal Tenderness: L-3-L-5 Lower Extremities: Full ROM and Muscle Strength 5/5 Arises from Table with ease Narrow Based  Gait     Skin:  General: Skin is warm and dry.  Neurological:     Mental Status: She is alert and oriented to  person, place, and time.  Psychiatric:        Mood and Affect: Mood normal.        Behavior: Behavior normal.         Assessment & Plan:  1. Fibromyalgia with myofascial pain: Continue home exercise program and heat therapy. Continue current medication regimen with Flexeril. 12/29/2022.  2. Rotator cuff syndrome on the right: Continue with stretching exercises and heat therapy. 12/29/2022 3. Cervicalgia. Post-laminectomy syndrome, facet arthropathy: Cervical Radiculopathy: Continue current medication regimen with Gabapentin. 12/29/2022  S/ P  EMG with Dr. Riley Kill on 03/08/2017: Diagnosed with Moderate to Severe CTS of Left wrist: 12/29/2022. 4. Depression: Continue current medication regimen with Cymbalta. PCP Following. Current  dose 90 mg daily. 12/29/2022 5. Mid/low back pain/ Lumbar Spondylosis: Continue Current Medication and stretching and heat therapy. 12/29/2022 Refilled: Morphine Sulfate IR 15 mg one tablet three times  a day as needed for pain # 80.  6. Lumbar Radiculopathy:  Continue current medication regimen with Gabapentin. 12/29/2022 7. OA of right hand: Continue Voltaren gel/ May substitute with Voltaren Tablet, she realizes she can't use both and verbalizes understanding. Continue with  heat therapy. 12/29/2022 8. Migraines: Continue with headache Journal.  Continue to Monitor  12/29/2022. 9. Left CTS:S/P EMG on 03/08/2017: S/P Carpal Tunnel Release on 06/14/2017 via . Dr. Orlan Leavens. 12/29/2022 10.. Muscle Spasm: Continue Tizanidine  Continue to Monitor. 12/29/2022 11. Left Shoulder Pain: Ortho Following: Her scheduled  surgery is pending at this time.  Continue HEP as Tolerated. Continue to Monitor. 12/29/2022  12. Fall at Home: No falls since last visit. Continue to Monitor. 12/29/2022      F/U in 2 months

## 2023-01-01 NOTE — Progress Notes (Signed)
See mychart note Dear Ms. Madilyn Fireman, Your lab results look good.  Take care Sincerely, Dr. Mardelle Matte

## 2023-01-02 ENCOUNTER — Other Ambulatory Visit: Payer: Self-pay | Admitting: Registered Nurse

## 2023-01-11 ENCOUNTER — Other Ambulatory Visit: Payer: Self-pay | Admitting: Family Medicine

## 2023-01-13 ENCOUNTER — Telehealth: Payer: Self-pay | Admitting: Gastroenterology

## 2023-01-13 NOTE — Telephone Encounter (Signed)
Hi Dr. Tomasa Rand,   Supervising Provider: 01/13/2023-AM   We received a referral for patient to have an evaluation for C-diff. She does have Gi history with [facility] and her records were obtained. Please review and advise on scheduling.  Records have been scanned into Media.  Thanks

## 2023-02-02 NOTE — Telephone Encounter (Signed)
Called patient to schedule mailbox is still full.

## 2023-02-12 ENCOUNTER — Telehealth: Payer: Self-pay | Admitting: Registered Nurse

## 2023-02-12 MED ORDER — MORPHINE SULFATE 15 MG PO TABS: ORAL_TABLET | ORAL | 0 refills | Status: DC

## 2023-02-12 NOTE — Telephone Encounter (Signed)
PMP was Reviewed,  MSIR e-scribed today.  Call placed to Ms. Mcgilberry regarding the above, she verbalizes understanding.

## 2023-02-12 NOTE — Telephone Encounter (Signed)
Patient requesting refill on morphine. Please send to [facility]

## 2023-02-15 ENCOUNTER — Other Ambulatory Visit: Payer: Self-pay | Admitting: Family Medicine

## 2023-02-18 ENCOUNTER — Ambulatory Visit (INDEPENDENT_AMBULATORY_CARE_PROVIDER_SITE_OTHER): Payer: 59

## 2023-02-18 VITALS — Wt 130.0 lb

## 2023-02-18 DIAGNOSIS — Z0000 Encounter for general adult medical examination without abnormal findings: Secondary | ICD-10-CM

## 2023-02-18 NOTE — Progress Notes (Signed)
Subjective:   Robin Morgan is a 67 y.o. female who presents for Medicare Annual (Subsequent) preventive examination.  Visit Complete: Virtual  I connected with  Robin Morgan on 02/18/23 by a audio enabled telemedicine application and verified that I am speaking with the correct person using two identifiers.  Patient Location: Home  Provider Location: Office/Clinic  I discussed the limitations of evaluation and management by telemedicine. The patient expressed understanding and agreed to proceed.   Review of Systems     Cardiac Risk Factors include: advanced age (>88men, >102 women);diabetes mellitus;dyslipidemia;hypertension     Objective:    Today's Vitals   02/18/23 1302  Weight: 130 lb (59 kg)   Body mass index is 24.56 kg/m.     01/07/2022    1:48 PM 12/06/2020    2:13 PM 02/07/2020    1:24 PM 06/03/2017    9:56 AM 04/29/2017    9:44 AM 04/08/2017   11:24 AM 04/08/2017   10:57 AM  Advanced Directives  Does Patient Have a Medical Advance Directive? Yes Yes No No No No No  Type of Advance Directive  Living will       Would patient like information on creating a medical advance directive?   Yes (MAU/Ambulatory/Procedural Areas - Information given) No - Patient declined   No - Patient declined    Current Medications (verified) Outpatient Encounter Medications as of 02/18/2023  Medication Sig   atorvastatin (LIPITOR) 10 MG tablet 40 mg.   Calcium Carbonate-Vitamin D (CALCIUM-VITAMIN D) 500-200 MG-UNIT per tablet Take 1 tablet by mouth 2 (two) times daily with a meal.   carboxymethylcellul-glycerin (OPTIVE) 0.5-0.9 % ophthalmic solution Apply to eye.   Cholecalciferol (VITAMIN D3) 50 MCG (2000 UT) CAPS Take 2 capsules by mouth daily.   co-enzyme Q-10 30 MG capsule Take 30 mg by mouth at bedtime.   cyclobenzaprine (FLEXERIL) 10 MG tablet one tablet   dapagliflozin propanediol (FARXIGA) 10 MG TABS tablet TAKE ONE TABLET BY MOUTH ONCE A DAY (NEED OFFICE VISIT FOR  REFILLS)   diclofenac Sodium (VOLTAREN) 1 % GEL    DULoxetine (CYMBALTA) 30 MG capsule TAKE 3 CAPSULES BY MOUTH DAILY   esomeprazole (NEXIUM) 40 MG capsule TAKE ONE CAPSULE BY MOUTH TWO TIMES DAILY BEFORE A MEAL. MORNING AND EVENING   fenofibrate 160 MG tablet TAKE 1 TABLET BY MOUTH ONCE A DAY   gabapentin (NEURONTIN) 300 MG capsule TAKE 2 CAPSULES BY MOUTH IN THE MORNING,2 CAPSULES AT LUNCH AND 2 CAPSULES AT BEDTIME   insulin degludec (TRESIBA FLEXTOUCH) 200 UNIT/ML FlexTouch Pen Inject 36 Units into the skin 2 (two) times daily.   levothyroxine (SYNTHROID) 50 MCG tablet 1 tablet Orally Once a day   lisinopril (ZESTRIL) 5 MG tablet TAKE 1 TABLETS BY MOUTH DAILY   Magnesium Gluconate (MAGNESIUM 27 PO) Take by mouth.   magnesium gluconate (MAGONATE) 500 MG tablet Take 500 mg by mouth 2 (two) times daily.   metFORMIN (GLUCOPHAGE) 1000 MG tablet TAKE 1 TABLET BY MOUTH TWICE A DAY WITH MEALS   morphine (MSIR) 15 MG tablet TAKE ONE TABLET BY MOUTH THREE TIMES DAILY AS NEEDED FOR MODERATE PAIN.   naloxegol oxalate (MOVANTIK) 25 MG TABS tablet Take by mouth.   Omega-3 Fatty Acids (FISH OIL) 1000 MG CAPS    RESTASIS 0.05 % ophthalmic emulsion Place 1 drop into both eyes daily.   SUPER B COMPLEX/C PO Take by mouth.   tiZANidine (ZANAFLEX) 2 MG tablet TAKE 1/2 TO 1 TABLET (1-2  MG TOTAL) BY MOUTH EVERY 6 HOURS AS NEEDED FOR MUSCLE SPASMS   topiramate (TOPAMAX) 100 MG tablet TAKE ONE TABLET BY MOUTH EVERY NIGHT AT BEDTIME   vitamin B-12 (CYANOCOBALAMIN) 1000 MCG tablet Take 1,000 mcg by mouth daily.   zolpidem (AMBIEN) 10 MG tablet TAKE ONE TABLET BY MOUTH EVERY NIGHT AT BEDTIME AS NEEDED FOR SLEEP   [DISCONTINUED] pravastatin (PRAVACHOL) 20 MG tablet 1 tablet Orally Once a day (Patient not taking: Reported on 12/29/2022)   No facility-administered encounter medications on file as of 02/18/2023.    Allergies (verified) Compazine, Prochlorperazine, Amoxicillin, Emetrol, Other, Prochlorperazine edisylate,  Prochlorperazine maleate, and Naproxen   History: Past Medical History:  Diagnosis Date   Calcific tendonitis    Cervical spondylosis without myelopathy    Depression    Diabetes mellitus    Esophageal stricture    Fibromyalgia    GERD (gastroesophageal reflux disease)    Hiatal hernia    Hyperlipidemia    Hypertension    Past Surgical History:  Procedure Laterality Date   ABDOMINAL HYSTERECTOMY     1986   APPENDECTOMY     1983   BREAST EXCISIONAL BIOPSY Right 1990's   BREAST SURGERY     rt breast cyst done in the 90's   CHOLECYSTECTOMY     GALLBLADDER SURGERY     1983   ROTATOR CUFF REPAIR     right   SPINE SURGERY     TONSILLECTOMY     1973   Family History  Problem Relation Age of Onset   Alzheimer's disease Mother    Diabetes Father    Heart disease Maternal Grandmother    Breast cancer Paternal Grandmother 31   Social History   Socioeconomic History   Marital status: Divorced    Spouse name: Not on file   Number of children: 1   Years of education: Not on file   Highest education level: Not on file  Occupational History   Occupation: disabled  Tobacco Use   Smoking status: Never   Smokeless tobacco: Never  Vaping Use   Vaping status: Never Used  Substance and Sexual Activity   Alcohol use: No   Drug use: No   Sexual activity: Not Currently  Other Topics Concern   Not on file  Social History Narrative   Not on file   Social Determinants of Health   Financial Resource Strain: Low Risk  (02/18/2023)   Overall Financial Resource Strain (CARDIA)    Difficulty of Paying Living Expenses: Not hard at all  Food Insecurity: No Food Insecurity (02/18/2023)   Hunger Vital Sign    Worried About Running Out of Food in the Last Year: Never true    Ran Out of Food in the Last Year: Never true  Transportation Needs: No Transportation Needs (02/18/2023)   PRAPARE - Administrator, Civil Service (Medical): No    Lack of Transportation  (Non-Medical): No  Physical Activity: Inactive (02/18/2023)   Exercise Vital Sign    Days of Exercise per Week: 0 days    Minutes of Exercise per Session: 0 min  Stress: No Stress Concern Present (02/18/2023)   [employer] of Occupational Health - Occupational Stress Questionnaire    Feeling of Stress : Only a little  Social Connections: Socially Isolated (02/18/2023)   Social Connection and Isolation Panel [NHANES]    Frequency of Communication with Friends and Family: More than three times a week    Frequency of Social Gatherings with  Friends and Family: Once a week    Attends Religious Services: Never    Database administrator or Organizations: No    Attends Engineer, structural: Never    Marital Status: Divorced    Tobacco Counseling Counseling given: Not Answered   Clinical Intake:  Pre-visit preparation completed: Yes  Pain : No/denies pain     BMI - recorded: 24.56 Nutritional Status: BMI 25 -29 Overweight Nutritional Risks: None Diabetes: Yes CBG done?: No Did pt. bring in CBG monitor from home?: No  How often do you need to have someone help you when you read instructions, pamphlets, or other written materials from your doctor or pharmacy?: 1 - Never  Interpreter Needed?: No  Information entered by :: Lanier Ensign, LPN   Activities of Daily Living    02/18/2023    1:04 PM  In your present state of health, do you have any difficulty performing the following activities:  Hearing? 0  Vision? 0  Difficulty concentrating or making decisions? 0  Walking or climbing stairs? 0  Dressing or bathing? 0  Doing errands, shopping? 0  Preparing Food and eating ? N  Using the Toilet? N  In the past six months, have you accidently leaked urine? Y  Do you have problems with loss of bowel control? Y  Comment wears pull ups  Managing your Medications? N  Managing your Finances? N  Housekeeping or managing your Housekeeping? N    Patient Care  Team: Willow Ora, MD as PCP - General (Family Medicine) Bradly Bienenstock, MD as Consulting Physician (Orthopedic Surgery) Ranelle Oyster, MD as Consulting Physician (Physical Medicine and Rehabilitation) Aquilla Hacker, PA-C as Physician Assistant (Otolaryngology) Dohmeier, Porfirio Mylar, MD as Consulting Physician (Neurology) Willis Modena, MD as Consulting Physician (Gastroenterology) Dimitri Ped, MD as Consulting Physician (Ophthalmology) Erroll Luna, [facility] (Inactive) as Pharmacist (Pharmacist) Bjorn Pippin, MD as Consulting Physician (Orthopedic Surgery)  Indicate any recent Medical Services you may have received from other than [facility] providers in the past year (date may be approximate).     Assessment:   This is a routine wellness examination for Persis.  Hearing/Vision screen Hearing Screening - Comments:: Pt denies any hearing issues  Vision Screening - Comments:: Pt follows up with Dr weaver for annual eye exams   Dietary issues and exercise activities discussed:     Goals Addressed             This Visit's Progress    Patient Stated       Get healthier        Depression Screen    02/18/2023    1:08 PM 12/29/2022    1:25 PM 12/28/2022    1:59 PM 12/03/2022    1:08 PM 10/29/2022    1:43 PM 10/28/2022   10:43 AM 09/28/2022    2:26 PM  PHQ 2/9 Scores  PHQ - 2 Score 0 2 4 3  0 0 2  PHQ- 9 Score 8  12 12   0 7    Fall Risk    02/18/2023    1:11 PM 12/29/2022    1:25 PM 12/28/2022    1:59 PM 10/29/2022    1:43 PM 10/28/2022   10:42 AM  Fall Risk   Falls in the past year? 0 0 0 0 0  Number falls in past yr: 0 0 0  0  Injury with Fall? 0 0 0  0  Risk for fall due to : Impaired vision;Impaired mobility  No Fall Risks  No Fall Risks  Follow up Falls prevention discussed  Falls evaluation completed  Falls evaluation completed    MEDICARE RISK AT HOME:  Medicare Risk at Home - 02/18/23 1312     Any stairs in or around the home? No    If so, are  there any without handrails? No    Home free of loose throw rugs in walkways, pet beds, electrical cords, etc? Yes    Adequate lighting in your home to reduce risk of falls? Yes    Life alert? No    Use of a cane, walker or w/c? No    Grab bars in the bathroom? Yes    Shower chair or bench in shower? No    Elevated toilet seat or a handicapped toilet? No             TIMED UP AND GO:  Was the test performed?  No    Cognitive Function:    05/26/2018   10:57 AM  MMSE - Mini Mental State Exam  Orientation to time 5  Orientation to Place 5  Registration 3  Attention/ Calculation 5  Recall 2  Language- name 2 objects 2  Language- repeat 1  Language- follow 3 step command 3  Language- read & follow direction 1  Write a sentence 1  Copy design 1  Total score 29        12/06/2020    2:17 PM 12/01/2019   11:17 AM  6CIT Screen  What Year? 0 points 0 points  What month? 0 points 0 points  What time?  0 points  Count back from 20 0 points 0 points  Months in reverse 4 points 0 points  Repeat phrase 6 points 0 points  Total Score  0 points    Immunizations Immunization History  Administered Date(s) Administered   Fluad Quad(high Dose 65+) 04/06/2019, 09/01/2021, 09/28/2022   Influenza Split 06/04/2009, 05/02/2012   Influenza, High Dose Seasonal PF 05/13/2020   Influenza, Quadrivalent, Recombinant, Inj, Pf 05/12/2016, 07/07/2017   Influenza, Seasonal, Injecte, Preservative Fre 06/06/2014, 04/24/2015   Influenza,inj,Quad PF,6+ Mos 05/12/2016, 07/07/2017, 05/05/2018   Influenza-Unspecified 09/03/2011, 04/19/2013   PFIZER(Purple Top)SARS-COV-2 Vaccination 10/03/2019, 10/24/2019, 06/28/2020   Pneumococcal Conjugate-13 12/01/2019   Pneumococcal Polysaccharide-23 08/29/2009, 10/19/2018   Tdap 05/11/2012, 09/28/2022    TDAP status: Due, Education has been provided regarding the importance of this vaccine. Advised may receive this vaccine at local pharmacy or Health Dept.  Aware to provide a copy of the vaccination record if obtained from local pharmacy or Health Dept. Verbalized acceptance and understanding.  Flu Vaccine status: Due, Education has been provided regarding the importance of this vaccine. Advised may receive this vaccine at local pharmacy or Health Dept. Aware to provide a copy of the vaccination record if obtained from local pharmacy or Health Dept. Verbalized acceptance and understanding.  Pneumococcal vaccine status: Up to date  Covid-19 vaccine status: Completed vaccines  Qualifies for Shingles Vaccine? Yes   Zostavax completed No   Shingrix Completed?: No.    Education has been provided regarding the importance of this vaccine. Patient has been advised to call insurance company to determine out of pocket expense if they have not yet received this vaccine. Advised may also receive vaccine at local pharmacy or Health Dept. Verbalized acceptance and understanding.  Screening Tests Health Maintenance  Topic Date Due   Zoster Vaccines- Shingrix (1 of 2) Never done   MAMMOGRAM  06/19/2021   COVID-19 Vaccine (  4 - 2023-24 season) 04/10/2022   OPHTHALMOLOGY EXAM  04/22/2022   FOOT EXAM  09/01/2022   DEXA SCAN  01/16/2023   INFLUENZA VACCINE  03/11/2023   HEMOGLOBIN A1C  03/29/2023   Diabetic kidney evaluation - Urine ACR  09/29/2023   Diabetic kidney evaluation - eGFR measurement  12/28/2023   Medicare Annual Wellness (AWV)  02/18/2024   Colonoscopy  11/19/2025   DTaP/Tdap/Td (3 - Td or Tdap) 09/28/2032   Pneumonia Vaccine 48+ Years old  Completed   Hepatitis C Screening  Completed   HPV VACCINES  Aged Out    Health Maintenance  Health Maintenance Due  Topic Date Due   Zoster Vaccines- Shingrix (1 of 2) Never done   MAMMOGRAM  06/19/2021   COVID-19 Vaccine (4 - 2023-24 season) 04/10/2022   OPHTHALMOLOGY EXAM  04/22/2022   FOOT EXAM  09/01/2022   DEXA SCAN  01/16/2023    Colorectal cancer screening: Type of screening: Colonoscopy.  Completed 11/20/15. Repeat every 10 years  Mammogram status: Completed 09/28/22. Repeat every year  Bone Density status: Completed 12/10/22. Results reflect: Bone density results: OSTEOPOROSIS. Repeat every 2 years.   Additional Screening:  Hepatitis C Screening:  Completed 07/07/17  Vision Screening: Recommended annual ophthalmology exams for early detection of glaucoma and other disorders of the eye. Is the patient up to date with their annual eye exam?  No  Who is the provider or what is the name of the office in which the patient attends annual eye exams? Request referal If pt is not established with a provider, would they like to be referred to a provider to establish care? Yes .   Dental Screening: Recommended annual dental exams for proper oral hygiene  Diabetic Foot Exam: Diabetic Foot Exam: Overdue, Pt has been advised about the importance in completing this exam. Pt is scheduled for diabetic foot exam on next appt .  Community Resource Referral / Chronic Care Management: CRR required this visit?  No   CCM required this visit?  No     Plan:     I have personally reviewed and noted the following in the patient's chart:   Medical and social history Use of alcohol, tobacco or illicit drugs  Current medications and supplements including opioid prescriptions. Patient is currently taking opioid prescriptions. Information provided to patient regarding non-opioid alternatives. Patient advised to discuss non-opioid treatment plan with their provider. Functional ability and status Nutritional status Physical activity Advanced directives List of other physicians Hospitalizations, surgeries, and ER visits in previous 12 months Vitals Screenings to include cognitive, depression, and falls Referrals and appointments  In addition, I have reviewed and discussed with patient certain preventive protocols, quality metrics, and best practice recommendations. A written personalized care  plan for preventive services as well as general preventive health recommendations were provided to patient.     Marzella Schlein, LPN   [id number]/61/0960   After Visit Summary: (MyChart) Due to this being a telephonic visit, the after visit summary with patients personalized plan was offered to patient via MyChart   Nurse Notes: none

## 2023-02-18 NOTE — Patient Instructions (Signed)
Robin Morgan , Thank you for taking time to come for your Medicare Wellness Visit. I appreciate your ongoing commitment to your health goals. Please review the following plan we discussed and let me know if I can assist you in the future.   These are the goals we discussed:  Goals      Patient Stated     None at this time     Patient Stated     Get healthier         This is a list of the screening recommended for you and due dates:  Health Maintenance  Topic Date Due   Zoster (Shingles) Vaccine (1 of 2) Never done   Mammogram  06/19/2021   COVID-19 Vaccine (4 - 2023-24 season) 04/10/2022   Eye exam for diabetics  04/22/2022   Complete foot exam   09/01/2022   DEXA scan (bone density measurement)  01/16/2023   Flu Shot  03/11/2023   Hemoglobin A1C  03/29/2023   Yearly kidney health urinalysis for diabetes  09/29/2023   Yearly kidney function blood test for diabetes  12/28/2023   Medicare Annual Wellness Visit  02/18/2024   Colon Cancer Screening  11/19/2025   DTaP/Tdap/Td vaccine (3 - Td or Tdap) 09/28/2032   Pneumonia Vaccine  Completed   Hepatitis C Screening  Completed   HPV Vaccine  Aged Out    Advanced directives: Advance directive discussed with you today. Even though you declined this today please call our office should you change your mind and we can give you the proper paperwork for you to fill out.  Conditions/risks identified: get healthier   Next appointment: Follow up in one year for your annual wellness visit    Preventive Care 65 Years and Older, Female Preventive care refers to lifestyle choices and visits with your health care provider that can promote health and wellness. What does preventive care include? A yearly physical exam. This is also called an annual well check. Dental exams once or twice a year. Routine eye exams. Ask your health care provider how often you should have your eyes checked. Personal lifestyle choices, including: Daily care of  your teeth and gums. Regular physical activity. Eating a healthy diet. Avoiding tobacco and drug use. Limiting alcohol use. Practicing safe sex. Taking low-dose aspirin every day. Taking vitamin and mineral supplements as recommended by your health care provider. What happens during an annual well check? The services and screenings done by your health care provider during your annual well check will depend on your age, overall health, lifestyle risk factors, and family history of disease. Counseling  Your health care provider may ask you questions about your: Alcohol use. Tobacco use. Drug use. Emotional well-being. Home and relationship well-being. Sexual activity. Eating habits. History of falls. Memory and ability to understand (cognition). Work and work Astronomer. Reproductive health. Screening  You may have the following tests or measurements: Height, weight, and BMI. Blood pressure. Lipid and cholesterol levels. These may be checked every 5 years, or more frequently if you are over 54 years old. Skin check. Lung cancer screening. You may have this screening every year starting at age 39 if you have a 30-pack-year history of smoking and currently smoke or have quit within the past 15 years. Fecal occult blood test (FOBT) of the stool. You may have this test every year starting at age 80. Flexible sigmoidoscopy or colonoscopy. You may have a sigmoidoscopy every 5 years or a colonoscopy every 10 years starting at  age 24. Hepatitis C blood test. Hepatitis B blood test. Sexually transmitted disease (STD) testing. Diabetes screening. This is done by checking your blood sugar (glucose) after you have not eaten for a while (fasting). You may have this done every 1-3 years. Bone density scan. This is done to screen for osteoporosis. You may have this done starting at age 18. Mammogram. This may be done every 1-2 years. Talk to your health care provider about how often you should  have regular mammograms. Talk with your health care provider about your test results, treatment options, and if necessary, the need for more tests. Vaccines  Your health care provider may recommend certain vaccines, such as: Influenza vaccine. This is recommended every year. Tetanus, diphtheria, and acellular pertussis (Tdap, Td) vaccine. You may need a Td booster every 10 years. Zoster vaccine. You may need this after age 44. Pneumococcal 13-valent conjugate (PCV13) vaccine. One dose is recommended after age 59. Pneumococcal polysaccharide (PPSV23) vaccine. One dose is recommended after age 39. Talk to your health care provider about which screenings and vaccines you need and how often you need them. This information is not intended to replace advice given to you by your health care provider. Make sure you discuss any questions you have with your health care provider. Document Released: 08/23/2015 Document Revised: 04/15/2016 Document Reviewed: 05/28/2015 Elsevier Interactive Patient Education  2017 [employer].  Fall Prevention in the Home Falls can cause injuries. They can happen to people of all ages. There are many things you can do to make your home safe and to help prevent falls. What can I do on the outside of my home? Regularly fix the edges of walkways and driveways and fix any cracks. Remove anything that might make you trip as you walk through a door, such as a raised step or threshold. Trim any bushes or trees on the path to your home. Use bright outdoor lighting. Clear any walking paths of anything that might make someone trip, such as rocks or tools. Regularly check to see if handrails are loose or broken. Make sure that both sides of any steps have handrails. Any raised decks and porches should have guardrails on the edges. Have any leaves, snow, or ice cleared regularly. Use sand or salt on walking paths during winter. Clean up any spills in your garage right away. This  includes oil or grease spills. What can I do in the bathroom? Use night lights. Install grab bars by the toilet and in the tub and shower. Do not use towel bars as grab bars. Use non-skid mats or decals in the tub or shower. If you need to sit down in the shower, use a plastic, non-slip stool. Keep the floor dry. Clean up any water that spills on the floor as soon as it happens. Remove soap buildup in the tub or shower regularly. Attach bath mats securely with double-sided non-slip rug tape. Do not have throw rugs and other things on the floor that can make you trip. What can I do in the bedroom? Use night lights. Make sure that you have a light by your bed that is easy to reach. Do not use any sheets or blankets that are too big for your bed. They should not hang down onto the floor. Have a firm chair that has side arms. You can use this for support while you get dressed. Do not have throw rugs and other things on the floor that can make you trip. What can I  do in the kitchen? Clean up any spills right away. Avoid walking on wet floors. Keep items that you use a lot in easy-to-reach places. If you need to reach something above you, use a strong step stool that has a grab bar. Keep electrical cords out of the way. Do not use floor polish or wax that makes floors slippery. If you must use wax, use non-skid floor wax. Do not have throw rugs and other things on the floor that can make you trip. What can I do with my stairs? Do not leave any items on the stairs. Make sure that there are handrails on both sides of the stairs and use them. Fix handrails that are broken or loose. Make sure that handrails are as long as the stairways. Check any carpeting to make sure that it is firmly attached to the stairs. Fix any carpet that is loose or worn. Avoid having throw rugs at the top or bottom of the stairs. If you do have throw rugs, attach them to the floor with carpet tape. Make sure that you  have a light switch at the top of the stairs and the bottom of the stairs. If you do not have them, ask someone to add them for you. What else can I do to help prevent falls? Wear shoes that: Do not have high heels. Have rubber bottoms. Are comfortable and fit you well. Are closed at the toe. Do not wear sandals. If you use a stepladder: Make sure that it is fully opened. Do not climb a closed stepladder. Make sure that both sides of the stepladder are locked into place. Ask someone to hold it for you, if possible. Clearly mark and make sure that you can see: Any grab bars or handrails. First and last steps. Where the edge of each step is. Use tools that help you move around (mobility aids) if they are needed. These include: Canes. Walkers. Scooters. Crutches. Turn on the lights when you go into a dark area. Replace any light bulbs as soon as they burn out. Set up your furniture so you have a clear path. Avoid moving your furniture around. If any of your floors are uneven, fix them. If there are any pets around you, be aware of where they are. Review your medicines with your doctor. Some medicines can make you feel dizzy. This can increase your chance of falling. Ask your doctor what other things that you can do to help prevent falls. This information is not intended to replace advice given to you by your health care provider. Make sure you discuss any questions you have with your health care provider. Document Released: 05/23/2009 Document Revised: 01/02/2016 Document Reviewed: 08/31/2014 Elsevier Interactive Patient Education  2017 [employer].

## 2023-02-22 ENCOUNTER — Telehealth: Payer: Self-pay | Admitting: Family Medicine

## 2023-02-22 NOTE — Telephone Encounter (Signed)
FYI: This call has been transferred to triage nurse: Access Nurse. Once the result note has been entered staff can address the message at that time.  Patient called in with the following symptoms:  Red Word:fall on 02/12/23; fell face first on concrete-- now has tender spot over right eye and sleepiness when sitting up or lying down--not normal for her    Please advise at Mobile [phone] (mobile)  Message is routed to Provider Pool.

## 2023-02-22 NOTE — Telephone Encounter (Signed)
Final outcome: See HCP within 4 hours. Per note, pt declined to be transferred back to office for scheduling. Stated she would look into forms of care. I will check back with patient in the morning.  Patient Name First: Robin Last: Morgan Gender: Female DOB: [birth year] Age: 67 Y 5 M 16 D Return Phone Number: [phone] (Primary) Address: City/ State/ Zip: [place]  [address] Client [facility] - Administrator, [facility] Provider Asencion Partridge- MD Contact Type Call Who Is Calling Patient / Member / Family / Caregiver Call Type Triage / Clinical Relationship To Patient Self Return Phone Number [phone] (Primary) Chief Complaint HEAD INJURY - and not acting right. Change in behaviour after hitting head. Reason for Call Symptomatic / Request for Health Information Initial Comment Caller states she fell at her dad's house on the concrete floor, face down going up the steps 10 days ago. She has a bruise on her right finger. She can't bend her knuckle all the way. She notices since the fall, she falls asleep as soon as she lies down. She has a tender spot over right her eye. Now, above that spot , she continues to get headaches. Translation No Nurse Assessment Nurse: Jayme Cloud, RN, Elmyra Ricks Date/Time Lamount Cohen Time): 02/22/2023 3:19:36 PM Confirm and document reason for call. If symptomatic, describe symptoms. ---Caller had a fall about 10 days ago. She fell climbing brick steps to a closed porch, she tripped over the threshold, and fell over into the porch face down. Father was concerned about her fall as it seemed hard. She hit R side of forehead and has a bruise, 4th finger to R hand she can not bend. L knee was hit, but is okay now. When laying down in bed, on her back she will start to feel very sleepy and will to sleep right away. This will happen every time she lays down. She wants to rule out  head injury. Does the patient have any new or worsening symptoms? ---Yes Will a triage be completed? ---Yes Related visit to physician within the last 2 weeks? ---No Does the PT have any chronic conditions? (i.e. diabetes, asthma, this includes High risk factors for pregnancy, etc.) ---Yes List chronic conditions. ---chronic back and neck pain, chronic fibromyalgia, diabetes Nurse Assessment Is this a behavioral health or substance abuse call? ---No Guidelines Guideline Title Affirmed Question Affirmed Notes Nurse Date/Time (Eastern Time) Head Injury [1] Age over 64 years AND [2] swelling or bruise Hulen Luster 02/22/2023 3:23:07 PM Disp. Time Lamount Cohen Time) Disposition Final User 02/22/2023 3:17:09 PM Send to Urgent Queue Mordecai Rasmussen 02/22/2023 3:31:07 PM See HCP within 4 Hours (or PCP triage) Yes Jayme Cloud, RN, Elmyra Ricks Final Disposition 02/22/2023 3:31:07 PM See HCP within 4 Hours (or PCP triage) Yes Jayme Cloud, RN, Rodney Cruise Disagree/Comply Comply Caller Understands Yes PreDisposition Call Doctor Care Advice Given Per Guideline SEE HCP (OR PCP TRIAGE) WITHIN 4 HOURS: * IF OFFICE WILL BE CLOSED AND NO PCP (PRIMARY CARE PROVIDER) SECOND-LEVEL TRIAGE: You need to be seen within the next 3 or 4 hours. A nearby Urgent Care Center [facility]) is often a good source of care. Another choice is to go to the ED. Go sooner if you become worse. CARE ADVICE given per Head Injury (Adult) guideline.  Comments User: Christean Grief, RN Date/Time (Eastern Time): 02/22/2023 3:27:01 PM area above bruise, she continues with headaches, she did not have a history of headaches. It is reducing  and is not frequent, she hopes it will get better with time. She has been taking tylenol for symptoms, which has been helpful. User: Christean Grief, RN Date/Time Lamount Cohen Time): 02/22/2023 3:31:03 PM Caller declined to be transferred back to office to attempt to schedule an appointment. She  states she will look into forms of care for following up on symptoms without specifying where- urgent care or PCP office. Referrals GO TO FACILITY UNDECIDED

## 2023-02-23 ENCOUNTER — Encounter: Payer: Self-pay | Admitting: Family Medicine

## 2023-02-23 NOTE — Telephone Encounter (Signed)
Attempted to call patient for an update but received no answer. VM full.

## 2023-02-24 NOTE — Telephone Encounter (Signed)
Called to get patient scheduled but received no answer. VM full.

## 2023-02-25 NOTE — Telephone Encounter (Signed)
Pt has been scheduled.  °

## 2023-02-26 ENCOUNTER — Ambulatory Visit (INDEPENDENT_AMBULATORY_CARE_PROVIDER_SITE_OTHER): Payer: 59 | Admitting: Family Medicine

## 2023-02-26 DIAGNOSIS — F0781 Postconcussional syndrome: Secondary | ICD-10-CM

## 2023-02-26 DIAGNOSIS — M79641 Pain in right hand: Secondary | ICD-10-CM

## 2023-02-26 DIAGNOSIS — Z79899 Other long term (current) drug therapy: Secondary | ICD-10-CM | POA: Diagnosis not present

## 2023-03-01 ENCOUNTER — Other Ambulatory Visit: Payer: Self-pay | Admitting: Family Medicine

## 2023-03-01 DIAGNOSIS — Z780 Asymptomatic menopausal state: Secondary | ICD-10-CM

## 2023-03-01 DIAGNOSIS — Z0000 Encounter for general adult medical examination without abnormal findings: Secondary | ICD-10-CM

## 2023-03-01 NOTE — Telephone Encounter (Signed)
Pt has already been seen

## 2023-03-04 ENCOUNTER — Encounter: Payer: 59 | Attending: Registered Nurse | Admitting: Registered Nurse

## 2023-03-04 DIAGNOSIS — Z5181 Encounter for therapeutic drug level monitoring: Secondary | ICD-10-CM | POA: Insufficient documentation

## 2023-03-04 DIAGNOSIS — M75102 Unspecified rotator cuff tear or rupture of left shoulder, not specified as traumatic: Secondary | ICD-10-CM | POA: Insufficient documentation

## 2023-03-04 DIAGNOSIS — M542 Cervicalgia: Secondary | ICD-10-CM | POA: Insufficient documentation

## 2023-03-04 DIAGNOSIS — M5136 Other intervertebral disc degeneration, lumbar region: Secondary | ICD-10-CM | POA: Insufficient documentation

## 2023-03-04 DIAGNOSIS — M797 Fibromyalgia: Secondary | ICD-10-CM | POA: Insufficient documentation

## 2023-03-04 DIAGNOSIS — Z79899 Other long term (current) drug therapy: Secondary | ICD-10-CM | POA: Insufficient documentation

## 2023-03-04 DIAGNOSIS — E1142 Type 2 diabetes mellitus with diabetic polyneuropathy: Secondary | ICD-10-CM | POA: Insufficient documentation

## 2023-03-04 DIAGNOSIS — G894 Chronic pain syndrome: Secondary | ICD-10-CM | POA: Insufficient documentation

## 2023-03-04 DIAGNOSIS — M5010 Cervical disc disorder with radiculopathy, unspecified cervical region: Secondary | ICD-10-CM | POA: Insufficient documentation

## 2023-03-04 DIAGNOSIS — M7918 Myalgia, other site: Secondary | ICD-10-CM | POA: Insufficient documentation

## 2023-03-04 DIAGNOSIS — M5412 Radiculopathy, cervical region: Secondary | ICD-10-CM | POA: Insufficient documentation

## 2023-03-08 ENCOUNTER — Other Ambulatory Visit: Payer: Self-pay | Admitting: Registered Nurse

## 2023-03-08 ENCOUNTER — Encounter: Payer: 59 | Admitting: Registered Nurse

## 2023-03-09 ENCOUNTER — Ambulatory Visit (INDEPENDENT_AMBULATORY_CARE_PROVIDER_SITE_OTHER)
Admission: RE | Admit: 2023-03-09 | Discharge: 2023-03-09 | Disposition: A | Discharge status: Home / Self Care | Payer: 59 | Source: Ambulatory Visit | Attending: Family Medicine | Admitting: Family Medicine

## 2023-03-09 DIAGNOSIS — M19041 Primary osteoarthritis, right hand: Secondary | ICD-10-CM | POA: Diagnosis not present

## 2023-03-09 DIAGNOSIS — S6991XD Unspecified injury of right wrist, hand and finger(s), subsequent encounter: Secondary | ICD-10-CM | POA: Diagnosis not present

## 2023-03-09 DIAGNOSIS — M79641 Pain in right hand: Secondary | ICD-10-CM | POA: Diagnosis not present

## 2023-03-10 NOTE — Progress Notes (Signed)
See notes below for documentation.  Epic was done due to global IT malfunction on day of service.    Media Information   Media Information   Document Information  Photos    03/10/2023 16:19  Attached To:  Office Visit on 02/26/23 with Willow Ora, MD  Source Information  Willow Ora, MD  [facility]  Document History     Document Information  Photos    03/10/2023 16:19  Attached To:  Office Visit on 02/26/23 with Willow Ora, MD  Source Information  Willow Ora, MD  [facility]  Document History

## 2023-03-17 NOTE — Progress Notes (Signed)
See my chart note.

## 2023-03-24 ENCOUNTER — Telehealth: Payer: Self-pay | Admitting: Registered Nurse

## 2023-03-24 ENCOUNTER — Other Ambulatory Visit: Payer: Self-pay | Admitting: Family Medicine

## 2023-03-24 MED ORDER — MORPHINE SULFATE 15 MG PO TABS: ORAL_TABLET | ORAL | 0 refills | Status: DC

## 2023-03-24 NOTE — Telephone Encounter (Signed)
The patient only has two of her Morphine pills left.

## 2023-03-24 NOTE — Telephone Encounter (Signed)
Return Robin Morgan call, she states she had a fall a few weeks ago, PCP following. She is scheduled for appointment in two weeks and MSIR sent to pharmacy. She verbalizes understanding.

## 2023-03-25 ENCOUNTER — Encounter (INDEPENDENT_AMBULATORY_CARE_PROVIDER_SITE_OTHER): Payer: Self-pay

## 2023-04-05 ENCOUNTER — Telehealth: Payer: Self-pay | Admitting: Family Medicine

## 2023-04-05 NOTE — Telephone Encounter (Addendum)
Patient states she's been having intermittent left sided chest/breast pain for months and wanted a diagnostic mammogram order sent in. Patient has an OV with PCP on 9/12. Can this be addressed in that visit or should she be worked in sooner?    Order can be faxed [phone].

## 2023-04-05 NOTE — Telephone Encounter (Signed)
Message sent thru MyChart 

## 2023-04-06 ENCOUNTER — Encounter: Payer: 59 | Attending: Registered Nurse | Admitting: Registered Nurse

## 2023-04-06 ENCOUNTER — Ambulatory Visit: Admission: RE | Admit: 2023-04-06 | Payer: 59 | Source: Ambulatory Visit

## 2023-04-06 VITALS — BP 100/63 | HR 104 | Ht 61.0 in | Wt 138.2 lb

## 2023-04-06 DIAGNOSIS — G43019 Migraine without aura, intractable, without status migrainosus: Secondary | ICD-10-CM | POA: Diagnosis not present

## 2023-04-06 DIAGNOSIS — M5136 Other intervertebral disc degeneration, lumbar region: Secondary | ICD-10-CM | POA: Insufficient documentation

## 2023-04-06 DIAGNOSIS — G8929 Other chronic pain: Secondary | ICD-10-CM | POA: Diagnosis present

## 2023-04-06 DIAGNOSIS — G894 Chronic pain syndrome: Secondary | ICD-10-CM | POA: Insufficient documentation

## 2023-04-06 DIAGNOSIS — M5416 Radiculopathy, lumbar region: Secondary | ICD-10-CM | POA: Diagnosis not present

## 2023-04-06 DIAGNOSIS — W19XXXD Unspecified fall, subsequent encounter: Secondary | ICD-10-CM | POA: Insufficient documentation

## 2023-04-06 DIAGNOSIS — M961 Postlaminectomy syndrome, not elsewhere classified: Secondary | ICD-10-CM | POA: Insufficient documentation

## 2023-04-06 DIAGNOSIS — Z79891 Long term (current) use of opiate analgesic: Secondary | ICD-10-CM | POA: Insufficient documentation

## 2023-04-06 DIAGNOSIS — Z5181 Encounter for therapeutic drug level monitoring: Secondary | ICD-10-CM | POA: Insufficient documentation

## 2023-04-06 DIAGNOSIS — M75101 Unspecified rotator cuff tear or rupture of right shoulder, not specified as traumatic: Secondary | ICD-10-CM | POA: Diagnosis not present

## 2023-04-06 DIAGNOSIS — G5602 Carpal tunnel syndrome, left upper limb: Secondary | ICD-10-CM | POA: Diagnosis not present

## 2023-04-06 DIAGNOSIS — M5010 Cervical disc disorder with radiculopathy, unspecified cervical region: Secondary | ICD-10-CM | POA: Diagnosis not present

## 2023-04-06 DIAGNOSIS — M545 Low back pain, unspecified: Secondary | ICD-10-CM | POA: Insufficient documentation

## 2023-04-06 DIAGNOSIS — M546 Pain in thoracic spine: Secondary | ICD-10-CM | POA: Insufficient documentation

## 2023-04-06 DIAGNOSIS — M797 Fibromyalgia: Secondary | ICD-10-CM | POA: Insufficient documentation

## 2023-04-06 DIAGNOSIS — M542 Cervicalgia: Secondary | ICD-10-CM | POA: Diagnosis not present

## 2023-04-06 DIAGNOSIS — F32A Depression, unspecified: Secondary | ICD-10-CM | POA: Insufficient documentation

## 2023-04-06 DIAGNOSIS — M25512 Pain in left shoulder: Secondary | ICD-10-CM | POA: Diagnosis not present

## 2023-04-06 DIAGNOSIS — M5412 Radiculopathy, cervical region: Secondary | ICD-10-CM

## 2023-04-06 DIAGNOSIS — M19041 Primary osteoarthritis, right hand: Secondary | ICD-10-CM | POA: Diagnosis not present

## 2023-04-06 DIAGNOSIS — M549 Dorsalgia, unspecified: Secondary | ICD-10-CM | POA: Diagnosis not present

## 2023-04-06 DIAGNOSIS — M7918 Myalgia, other site: Secondary | ICD-10-CM

## 2023-04-06 DIAGNOSIS — M62838 Other muscle spasm: Secondary | ICD-10-CM | POA: Insufficient documentation

## 2023-04-06 MED ORDER — MORPHINE SULFATE 15 MG PO TABS: ORAL_TABLET | ORAL | 0 refills | Status: DC

## 2023-04-06 NOTE — Progress Notes (Signed)
Subjective:    Patient ID: Robin Morgan, female    DOB: Oct 19, 1955, 67 y.o.   MRN: [id number]  HPI: Robin Morgan is a 67 y.o. female who returns for follow up appointment for chronic pain and medication refill. She states her pain is located in her neck radiating into her left shoulder, mid- lower back pain radiating into her bilateral lower extremities .She rates her pain 8. Her current exercise regime is walking and performing stretching exercises.  Robin Morgan reports she had a fall in July , she was walking up her father steps and tripped over porch threshold and fell forward.Her dad helped her up, she F/U with her PCP one week later. She was educated on falls prevention, she verbalizes understanding.   Robin Morgan Morphine equivalent is 46.15  MME.   UDS ordered today.    Pain Inventory Average Pain 9 Pain Right Now 8 My pain is constant, stabbing, and aching  In the last 24 hours, has pain interfered with the following? General activity 9 Relation with others 9 Enjoyment of life 9 What TIME of day is your pain at its worst? morning , daytime, evening, and night Sleep (in general) Poor  Pain is worse with: walking, bending, and standing Pain improves with: medication Relief from Meds: 10  Family History  Problem Relation Age of Onset   Alzheimer's disease Mother    Diabetes Father    Heart disease Maternal Grandmother    Breast cancer Paternal Grandmother 29   Social History   Socioeconomic History   Marital status: Divorced    Spouse name: Not on file   Number of children: 1   Years of education: Not on file   Highest education level: Not on file  Occupational History   Occupation: disabled  Tobacco Use   Smoking status: Never   Smokeless tobacco: Never  Vaping Use   Vaping status: Never Used  Substance and Sexual Activity   Alcohol use: No   Drug use: No   Sexual activity: Not Currently  Other Topics Concern   Not on file  Social History Narrative    Not on file   Social Determinants of Health   Financial Resource Strain: Low Risk  (02/18/2023)   Overall Financial Resource Strain (CARDIA)    Difficulty of Paying Living Expenses: Not hard at all  Food Insecurity: No Food Insecurity (02/18/2023)   Hunger Vital Sign    Worried About Running Out of Food in the Last Year: Never true    Ran Out of Food in the Last Year: Never true  Transportation Needs: No Transportation Needs (02/18/2023)   PRAPARE - Administrator, Civil Service (Medical): No    Lack of Transportation (Non-Medical): No  Physical Activity: Inactive (02/18/2023)   Exercise Vital Sign    Days of Exercise per Week: 0 days    Minutes of Exercise per Session: 0 min  Stress: No Stress Concern Present (02/18/2023)   [employer] of Occupational Health - Occupational Stress Questionnaire    Feeling of Stress : Only a little  Social Connections: Socially Isolated (02/18/2023)   Social Connection and Isolation Panel [NHANES]    Frequency of Communication with Friends and Family: More than three times a week    Frequency of Social Gatherings with Friends and Family: Once a week    Attends Religious Services: Never    Database administrator or Organizations: No    Attends Banker Meetings: Never  Marital Status: Divorced   Past Surgical History:  Procedure Laterality Date   ABDOMINAL HYSTERECTOMY     1986   APPENDECTOMY     1983   BREAST EXCISIONAL BIOPSY Right 1990's   BREAST SURGERY     rt breast cyst done in the 90's   CHOLECYSTECTOMY     GALLBLADDER SURGERY     1983   ROTATOR CUFF REPAIR     right   SPINE SURGERY     TONSILLECTOMY     1973   Past Surgical History:  Procedure Laterality Date   ABDOMINAL HYSTERECTOMY     1986   APPENDECTOMY     1983   BREAST EXCISIONAL BIOPSY Right 1990's   BREAST SURGERY     rt breast cyst done in the 90's   CHOLECYSTECTOMY     GALLBLADDER SURGERY     1983   ROTATOR CUFF REPAIR     right    SPINE SURGERY     TONSILLECTOMY     1973   Past Medical History:  Diagnosis Date   Calcific tendonitis    Cervical spondylosis without myelopathy    Depression    Diabetes mellitus    Esophageal stricture    Fibromyalgia    GERD (gastroesophageal reflux disease)    Hiatal hernia    Hyperlipidemia    Hypertension    BP 100/63   Pulse (!) 104   Ht 5\' 1"  (1.549 m)   Wt 138 lb 3.2 oz (62.7 kg)   SpO2 94%   BMI 26.11 kg/m   Opioid Risk Score:   Fall Risk Score:  `1  Depression screen PHQ 2/9     02/18/2023    1:08 PM 12/29/2022    1:25 PM 12/28/2022    1:59 PM 12/03/2022    1:08 PM 10/29/2022    1:43 PM 10/28/2022   10:43 AM 09/28/2022    2:26 PM  Depression screen PHQ 2/9  Decreased Interest 0 1 2 2  0 0 2  Down, Depressed, Hopeless 0 1 2 1  0 0   PHQ - 2 Score 0 2 4 3  0 0 2  Altered sleeping 1  1 2   0 0  Tired, decreased energy 3  3 2   0 3  Change in appetite 2  2 3   0 2  Feeling bad or failure about yourself  1  1 1   0 0  Trouble concentrating 1  1 1   0 0  Moving slowly or fidgety/restless 0  0 0  0 0  Suicidal thoughts 0  0 0  0 0  PHQ-9 Score 8  12 12   0 7  Difficult doing work/chores Somewhat difficult  Somewhat difficult Very difficult  Not difficult at all Somewhat difficult      Review of Systems  Musculoskeletal:        Left arm pain Buttocks pain  All other systems reviewed and are negative.     Objective:   Physical Exam Vitals and nursing note reviewed.  Constitutional:      Appearance: Normal appearance.  Neck:     Comments: Cervical Paraspinal Tenderness: C-5-C-6   Cardiovascular:     Rate and Rhythm: Normal rate and regular rhythm.     Pulses: Normal pulses.     Heart sounds: Normal heart sounds.  Pulmonary:     Effort: Pulmonary effort is normal.     Breath sounds: Normal breath sounds.  Musculoskeletal:     Cervical back: Normal  range of motion and neck supple.     Comments: Normal Muscle Bulk and Muscle Testing Reveals:  Upper  Extremities: Full ROM and Muscle Strength on Right 4/5 and Left 5/5   Thoracic Hypersensitivity: T-1-T-7 Lower Extremities: Full ROM and Muscle Strength 5/5 Arises from Table with ease Narrow Based  Gait     Skin:    General: Skin is warm and dry.  Neurological:     Mental Status: She is alert and oriented to person, place, and time.  Psychiatric:        Mood and Affect: Mood normal.        Behavior: Behavior normal.         Assessment & Plan:  1.Acute Exacerbation of Chronic Low Back Pain: RX: Lumbar X-ray: Continue to Monitor.  2. Fibromyalgia with myofascial pain: Continue home exercise program and heat therapy. Continue current medication regimen with Flexeril. 04/06/2023. 3. Rotator cuff syndrome on the right: Continue with stretching exercises and heat therapy. 04/06/2023 4. Cervicalgia. Post-laminectomy syndrome, facet arthropathy: Cervical Radiculopathy: Continue current medication regimen with Gabapentin. 04/06/2023  S/ P  EMG with Dr. Riley Kill on 03/08/2017: Diagnosed with Moderate to Severe CTS of Left wrist: 04/06/2023. 5.. Depression: Continue current medication regimen with Cymbalta. PCP Following. Current  dose 90 mg daily. 04/06/2023 6. Mid/low back pain/ Lumbar Spondylosis: Continue Current Medication and stretching and heat therapy. 04/06/2023 Refilled: Morphine Sulfate IR 15 mg one tablet three times  a day as needed for pain # 80.  7. Lumbar Radiculopathy:  Continue current medication regimen with Gabapentin. 04/06/2023 8. OA of right hand: Continue Voltaren gel/ May substitute with Voltaren Tablet, she realizes she can't use both and verbalizes understanding. Continue with  heat therapy. 04/06/2023 9. Migraines: Continue with headache Journal.  Continue to Monitor  04/06/2023. 10 Left CTS:S/P EMG on 03/08/2017: S/P Carpal Tunnel Release on 06/14/2017 via . Dr. Orlan Leavens. 04/06/2023 11.. Muscle Spasm: Continue Tizanidine  Continue to Monitor. 04/06/2023 12. Left  Shoulder Pain: Ortho Following: Her scheduled  surgery is pending at this time.  Continue HEP as Tolerated. Continue to Monitor. 04/06/2023  13. Fall at [facility]: Educated on [place] Prevention: She verbalizes understanding.  Continue to Monitor. 04/06/2023      F/U in 2 months

## 2023-04-07 ENCOUNTER — Ambulatory Visit: Payer: 59 | Admitting: Family Medicine

## 2023-04-13 ENCOUNTER — Encounter: Payer: Self-pay | Admitting: Registered Nurse

## 2023-04-13 LAB — TOXASSURE SELECT,+ANTIDEPR,UR

## 2023-04-15 ENCOUNTER — Ambulatory Visit: Payer: 59 | Admitting: Family Medicine

## 2023-04-21 ENCOUNTER — Telehealth: Payer: Self-pay | Admitting: Registered Nurse

## 2023-04-21 ENCOUNTER — Other Ambulatory Visit: Payer: Self-pay | Admitting: Registered Nurse

## 2023-04-21 NOTE — Telephone Encounter (Signed)
Call placed to [name],  No answer, left message to return the call.

## 2023-04-22 ENCOUNTER — Ambulatory Visit (INDEPENDENT_AMBULATORY_CARE_PROVIDER_SITE_OTHER): Payer: 59 | Admitting: Family Medicine

## 2023-04-22 ENCOUNTER — Encounter: Payer: Self-pay | Admitting: Family Medicine

## 2023-04-22 VITALS — BP 108/60 | HR 87 | Temp 97.9°F | Ht 61.0 in | Wt 135.2 lb

## 2023-04-22 DIAGNOSIS — G894 Chronic pain syndrome: Secondary | ICD-10-CM | POA: Diagnosis not present

## 2023-04-22 DIAGNOSIS — N644 Mastodynia: Secondary | ICD-10-CM

## 2023-04-22 DIAGNOSIS — E1140 Type 2 diabetes mellitus with diabetic neuropathy, unspecified: Secondary | ICD-10-CM | POA: Diagnosis not present

## 2023-04-22 DIAGNOSIS — I152 Hypertension secondary to endocrine disorders: Secondary | ICD-10-CM

## 2023-04-22 DIAGNOSIS — Z794 Long term (current) use of insulin: Secondary | ICD-10-CM | POA: Diagnosis not present

## 2023-04-22 DIAGNOSIS — Z1231 Encounter for screening mammogram for malignant neoplasm of breast: Secondary | ICD-10-CM

## 2023-04-22 DIAGNOSIS — E1159 Type 2 diabetes mellitus with other circulatory complications: Secondary | ICD-10-CM

## 2023-04-22 DIAGNOSIS — F339 Major depressive disorder, recurrent, unspecified: Secondary | ICD-10-CM

## 2023-04-22 LAB — POCT GLYCOSYLATED HEMOGLOBIN (HGB A1C): Hemoglobin A1C: 5.9 % — AB (ref 4.0–5.6)

## 2023-04-22 MED ORDER — TRESIBA FLEXTOUCH 200 UNIT/ML ~~LOC~~ SOPN: 30.0000 [IU] | PEN_INJECTOR | Freq: Two times a day (BID) | SUBCUTANEOUS | Status: DC

## 2023-04-22 MED ORDER — DULOXETINE HCL 30 MG PO CPEP: 90.0000 mg | ORAL_CAPSULE | Freq: Every day | ORAL | 3 refills | Status: DC

## 2023-04-22 NOTE — Progress Notes (Signed)
Subjective  CC:  Chief Complaint  Patient presents with   Diabetes    HPI: Robin Morgan is a 67 y.o. female who presents to the office today for follow up of diabetes and problems listed above in the chief complaint.  Diabetes follow up: Her diabetic control is reported as Unchanged. Has experienced a few occassions w/ hypoglycemic sxs.  She denies exertional CP or SOB. Takiing levemeir 36 units bid. . She denies foot sores but endorses  paresthesias; at times feels like walking on glass in forefoot. Needs eye exam. Had bad experience with last eye doctor; but needs to get scheduled with hecker's office: inquires for recommendations.  Depression remains active Chronic pain and narcotics very limiting. BP remains well controlled.  HM: due for mammo but reports intermittent pain. Needs diagnostic. Dexa is scheduled for jan 2025.   Wt Readings from Last 3 Encounters:  04/22/23 135 lb 3.2 oz (61.3 kg)  04/06/23 138 lb 3.2 oz (62.7 kg)  02/18/23 130 lb (59 kg)    BP Readings from Last 3 Encounters:  04/22/23 108/60  04/06/23 100/63  12/29/22 99/62    Assessment  1. Controlled type 2 diabetes mellitus with diabetic neuropathy, with long-term current use of insulin (HCC)   2. Hypertension associated with diabetes (HCC)   3. Chronic pain associated with significant psychosocial dysfunction   4. Depression, recurrent (HCC)   5. Screening mammogram for breast cancer   6. Breast pain      Plan  Diabetes is currently very well controlled. Concerned about hypoglycemia; decrease levemir back down to 30bid. Continue met and farxiga. On high dose gabapenting for neuropathy. Needs eye exam; reassure her to get back in with Dr. Deirdre Evener office.  Continue bp meds Diagnostic mammogram ordered Depression on high dose cymbalta: multifactorial and related in part to chronic pain and chronic narcotic use. Counseling done. refilled  Follow up: 29mo for recheck diabetes Orders Placed This  Encounter  Procedures   MM 3D DIAGNOSTIC MAMMOGRAM BILATERAL BREAST   POCT HgB A1C   Meds ordered this encounter  Medications   DULoxetine (CYMBALTA) 30 MG capsule    Sig: Take 3 capsules (90 mg total) by mouth daily.    Dispense:  270 capsule    Refill:  3    Please keep on file until next due refill.   insulin degludec (TRESIBA FLEXTOUCH) 200 UNIT/ML FlexTouch Pen    Sig: Inject 30 Units into the skin 2 (two) times daily.      Immunization History  Administered Date(s) Administered   Fluad Quad(high Dose 65+) 04/06/2019, 09/01/2021, 09/28/2022   Influenza Split 06/04/2009, 05/02/2012   Influenza, High Dose Seasonal PF 05/13/2020   Influenza, Quadrivalent, Recombinant, Inj, Pf 05/12/2016, 07/07/2017   Influenza, Seasonal, Injecte, Preservative Fre 06/06/2014, 04/24/2015   Influenza,inj,Quad PF,6+ Mos 05/12/2016, 07/07/2017, 05/05/2018   Influenza-Unspecified 09/03/2011, 04/19/2013   PFIZER(Purple Top)SARS-COV-2 Vaccination 10/03/2019, 10/24/2019, 06/28/2020   Pneumococcal Conjugate-13 12/01/2019   Pneumococcal Polysaccharide-23 08/29/2009, 10/19/2018   Tdap 05/11/2012, 09/28/2022    Diabetes Related Lab Review: Lab Results  Component Value Date   HGBA1C 5.9 (A) 04/22/2023   HGBA1C 6.9 (H) 09/28/2022   HGBA1C 6.4 (A) 12/03/2021    Lab Results  Component Value Date   MICROALBUR 0.7 09/28/2022   Lab Results  Component Value Date   CREATININE 0.88 12/28/2022   BUN 14 12/28/2022   NA 141 12/28/2022   K 4.4 12/28/2022   CL 105 12/28/2022   CO2 26 12/28/2022  Lab Results  Component Value Date   CHOL 134 09/28/2022   CHOL 128 09/01/2021   CHOL 116 05/27/2020   Lab Results  Component Value Date   HDL 43.50 09/28/2022   HDL 41.90 09/01/2021   HDL 39 (L) 05/27/2020   Lab Results  Component Value Date   LDLCALC 58 09/28/2022   LDLCALC 64 09/01/2021   LDLCALC 56 05/27/2020   Lab Results  Component Value Date   TRIG 160.0 (H) 09/28/2022   TRIG 112.0  09/01/2021   TRIG 124 05/27/2020   Lab Results  Component Value Date   CHOLHDL 3 09/28/2022   CHOLHDL 3 09/01/2021   CHOLHDL 3.0 05/27/2020   No results found for: "LDLDIRECT" The 10-year ASCVD risk score (Arnett DK, et al., 2019) is: 14.1%   Values used to calculate the score:     Age: 82 years     Sex: Female     Is Non-Hispanic African American: No     Diabetic: Yes     Tobacco smoker: No     Systolic Blood Pressure: 108 mmHg     Is BP treated: Yes     HDL Cholesterol: 43.5 mg/dL     Total Cholesterol: 134 mg/dL I have reviewed the PMH, Fam and Soc history. Patient Active Problem List   Diagnosis Date Noted Date Diagnosed   Insomnia due to medical condition 12/01/2019     Priority: High   Chronic prescription opiate use 09/02/2018     Priority: High   Diabetic peripheral neuropathy associated with type 2 diabetes mellitus (HCC) 01/06/2017     Priority: High   OSA (obstructive sleep apnea) 03/10/2016     Priority: High   Controlled diabetes mellitus with diabetic neuropathy, with long-term current use of insulin (HCC) 10/11/2014     Priority: High   Hypertension associated with diabetes (HCC) 10/11/2014     Priority: High   Combined hyperlipidemia associated with type 2 diabetes mellitus (HCC) 10/11/2014     Priority: High   DDD (degenerative disc disease), lumbar 01/15/2014     Priority: High   Hypothyroidism, acquired 06/12/2013     Priority: High   Cervical post-laminectomy syndrome 05/10/2013     Priority: High   Depression, recurrent (HCC) 10/28/2011     Priority: High   Chronic pain associated with significant psychosocial dysfunction 01/03/2010     Priority: High    Chronic Pain Syndrome, Dr. Guadalupe Dawn     Migraine without aura, intractable, without status migrainosus 07/31/2020     Priority: Medium    Esophageal stricture      Priority: Medium    Tenosynovitis, wrist 10/07/2017     Priority: Medium    Trigger thumb of left hand 10/07/2017      Priority: Medium    Laryngopharyngeal reflux (LPR) 08/26/2017     Priority: Medium    Dysphagia 08/26/2017     Priority: Medium    Left carpal tunnel syndrome 03/08/2017     Priority: Medium    Fibromyalgia 02/12/2016     Priority: Medium    Osteoporosis 12/01/2012     Priority: Medium     01/2021: osteoporosis T= -2.7 wrist. Spine excluded. Offer tx.  10/2017: osteopenia: T = -1.9; stable. Recheck 2 years 4/ 2014 T = -1.2 at hip, -1.8 at spine    Chronic diarrhea 07/04/2012     Priority: Medium     Overview:  ? Related to gallbladder surgery. Has been evaluated by Dr. Madilyn Fireman GI. Lomotil as  needed.    Vitamin D deficiency 09/28/2022     Priority: Low   Vitamin B12 deficiency 09/28/2022     Priority: Low   Traumatic tear of left rotator cuff 09/28/2022    Urinary urgency 09/28/2022    Rotator cuff syndrome, left 12/11/2020    Cervical disc disorder with radiculopathy of cervical region 07/31/2020    Biceps tendinitis, right 02/07/2020    Mid back pain 12/06/2019    Myofascial muscle pain 10/28/2011     Social History: Patient  reports that she has never smoked. She has never used smokeless tobacco. She reports that she does not drink alcohol and does not use drugs.  Review of Systems: Ophthalmic: negative for eye pain, loss of vision or double vision Cardiovascular: negative for chest pain Respiratory: negative for SOB or persistent cough Gastrointestinal: negative for abdominal pain Genitourinary: negative for dysuria or gross hematuria MSK: negative for foot lesions Neurologic: negative for weakness or gait disturbance  Objective  Vitals: BP 108/60   Pulse 87   Temp 97.9 F (36.6 C)   Ht 5\' 1"  (1.549 m)   Wt 135 lb 3.2 oz (61.3 kg)   SpO2 96%   BMI 25.55 kg/m  General: well appearing, no acute distress  Psych:  Alert and oriented, normal mood and affect HEENT:  Normocephalic, atraumatic, moist mucous membranes, supple neck  Cardiovascular:  Nl S1 and S2, RRR  without murmur, gallop or rub. no edema Respiratory:  Good breath sounds bilaterally, CTAB with normal effort, no rales Neurologic:   Mental status is normal. normal gait Foot exam: no erythema, pallor, or cyanosis visible nl proprioception and sensation to monofilament testing bilaterally, +2 distal pulses bilaterally    Diabetic education: ongoing education regarding chronic disease management for diabetes was given today. We continue to reinforce the ABC's of diabetic management: A1c (<7 or 8 dependent upon patient), tight blood pressure control, and cholesterol management with goal LDL < 100 minimally. We discuss diet strategies, exercise recommendations, medication options and possible side effects. At each visit, we review recommended immunizations and preventive care recommendations for diabetics and stress that good diabetic control can prevent other problems. See below for this patient's data.   Commons side effects, risks, benefits, and alternatives for medications and treatment plan prescribed today were discussed, and the patient expressed understanding of the given instructions. Patient is instructed to call or message via MyChart if he/she has any questions or concerns regarding our treatment plan. No barriers to understanding were identified. We discussed Red Flag symptoms and signs in detail. Patient expressed understanding regarding what to do in case of urgent or emergency type symptoms.  Medication list was reconciled, printed and provided to the patient in AVS. Patient instructions and summary information was reviewed with the patient as documented in the AVS. This note was prepared with assistance of Dragon voice recognition software. Occasional wrong-word or sound-a-like substitutions may have occurred due to the inherent limitations of voice recognition software

## 2023-04-22 NOTE — Patient Instructions (Signed)
Please return in 3 months for diabetes follow up   If you have any questions or concerns, please don't hesitate to send me a message via MyChart or call the office at [phone]. Thank you for visiting with us today! It's our pleasure caring for you.  

## 2023-05-03 ENCOUNTER — Encounter: Payer: 59 | Admitting: Psychology

## 2023-05-04 ENCOUNTER — Encounter: Payer: 59 | Attending: Registered Nurse | Admitting: Psychology

## 2023-05-04 DIAGNOSIS — F339 Major depressive disorder, recurrent, unspecified: Secondary | ICD-10-CM | POA: Insufficient documentation

## 2023-05-04 DIAGNOSIS — M546 Pain in thoracic spine: Secondary | ICD-10-CM | POA: Insufficient documentation

## 2023-05-04 DIAGNOSIS — G894 Chronic pain syndrome: Secondary | ICD-10-CM | POA: Insufficient documentation

## 2023-05-04 DIAGNOSIS — M5412 Radiculopathy, cervical region: Secondary | ICD-10-CM | POA: Insufficient documentation

## 2023-05-04 DIAGNOSIS — G8929 Other chronic pain: Secondary | ICD-10-CM | POA: Insufficient documentation

## 2023-05-04 DIAGNOSIS — M5010 Cervical disc disorder with radiculopathy, unspecified cervical region: Secondary | ICD-10-CM | POA: Diagnosis present

## 2023-05-04 DIAGNOSIS — M797 Fibromyalgia: Secondary | ICD-10-CM | POA: Diagnosis present

## 2023-05-05 ENCOUNTER — Encounter: Payer: Self-pay | Admitting: Psychology

## 2023-05-05 NOTE — Progress Notes (Signed)
Neuropsychology Visit  Patient:  Robin Morgan   DOB: 1956-08-04  MR Number: [id number]  Location: [facility] [address] [place], [address] [place] [address] Dept: [phone]  Date of Service: 05/04/2023  Start: 11 AM End: 12 PM  Duration of Service: 1 Hour  Today's visit was conducted in my outpatient clinic office with the patient myself present.  Today's visit was an in person visit.  Provider/Observer:     Hershal Coria PsyD  Chief Complaint:      Chief Complaint  Patient presents with   Anxiety   Depression   Pain   Neck Pain   Back Pain   Stress   Sleeping Problem    Reason For Service:     Robin Morgan is a 67 year old right-handed female referred by Dr. Riley Kill for neuropsychological/psychological consultation due to significant depression and anxiety with primary stressors related to family issues in the setting of fibromyalgia and chronic severe pain symptoms.  The patient reports that she has had significant pain in her back primarily mid back for 20 years or more.  The patient is also been diagnosed with fibromyalgia.  The patient had surgery on her neck to try to alleviate help with her severe recurrent headaches but experience little change post surgery.  The patient also has significant shoulder pain and severe headache with these pain and headaches resulting in her having to spend multiple days at a time essentially in bed.  The patient is continued to struggle with anxiety and depressive symptoms on top of her chronic pain disorder.  The patient was asked to help take care of her father who has dementia and her sisters expected her to stay there for an extended period of time.  The patient was there for almost a week experiencing increasing problems due to poor sleeping conditions and an exacerbation of her underlying chronic pain symptoms.  There continues  to be stressors and animosity between the patient and her siblings.  There continue to be a lot of stress between the patient and her sisters particularly with 1 sister as they continue to struggle with managing the care of her father.  The patient had a fall with concussive event and neck injury/exacerbation of previous issues and worsening of shoulder pain.  The patient is struggled with managing her pain.  Treatment Interventions:  Therapeutic interventions for issues related to chronic pain/fibromyalgia and recurrent headaches and depression anxiety symptoms with significant psychosocial stressors.  Participation Level:   Active  Participation Quality:  Appropriate and Attentive      Behavioral Observation:  Well Groomed, Alert, and Appropriate.   Current Psychosocial Factors: The patient continues to have a lot of stress around her father's deteriorating health and one of her sisters that likely has significant personality disorder that causes a great deal of stress within the family structure.  The patient is doing as much as she can to help with her father but her physical limitations play significant negative role in her capacity.  Content of Session:   Reviewed current symptoms and continue to develop coping skills around her chronic pain and fibromyalgia symptoms as well as headaches and dealing with cervical postlaminectomy syndrome.  We also worked on significant psychosocial stressors that are being impacted by the patient's limited physical functioning as well as her stressors having a significant deleterious on her overall pain symptoms.  Effectiveness of Interventions: The patient has  been quite open and active in these therapeutic processes and rapport been easily established.  The patient's cognitive function is good and while stress affects her ability to stay focused this is not indicative of any type of cognitive deficits with the patient.  The patient is already working on some  of the initial issues we have focused around sleep hygiene, physical activity and other coping issues around her chronic pain symptoms.  Target Goals:   Goals include building better coping and adaptive skills around her chronic pain symptoms and working on better pain management as well as working on significant psychosocial stressors that are negatively impacting her current medical issues.  Goals Last Reviewed:   04/30/2023  Goals Addressed Today:    Today we worked a lot on the various stressors in her life that are exacerbating her chronic pain symptoms.  Impression/Diagnosis:   Robin Morgan is a 67 year old right-handed female referred by Dr. Riley Kill for neuropsychological/psychological consultation due to significant depression and anxiety with primary stressors related to family issues in the setting of fibromyalgia and chronic severe pain symptoms.  The patient reports that she has had significant pain in her back primarily mid back for 20 years or more.  The patient is also been diagnosed with fibromyalgia.  The patient had surgery on her neck to try to alleviate help with her severe recurrent headaches but experience little change post surgery.  The patient also has significant shoulder pain and severe headache with these pain and headaches resulting in her having to spend multiple days at a time essentially in bed.   I do think that the patient's anxiety, stress and depressive symptoms do play and exacerbating role in her overall pain symptoms and while she clearly has abnormalities in lumbar and cervical regions as a primary factor for her pain symptoms her stress responses, fibromyalgia and depression do play a role in the acute day-to-day level of her pain.  Today we have continue to work on therapeutic interventions around chronic pain and building better coping strategies for the numerous stressors that she is facing particularly around issues with her family and the impact that  stress has on her chronic pain difficulties.  Diagnosis:   Depression, recurrent (HCC)  Chronic pain syndrome  Cervical radiculitis  Cervical disc disorder with radiculopathy of cervical region  Chronic bilateral thoracic back pain  Fibromyalgia    Arley Phenix, Psy.D. Clinical Psychologist Neuropsychologist

## 2023-05-24 ENCOUNTER — Other Ambulatory Visit: Payer: Self-pay | Admitting: Physical Medicine & Rehabilitation

## 2023-06-07 ENCOUNTER — Encounter: Payer: Self-pay | Admitting: Registered Nurse

## 2023-06-07 ENCOUNTER — Encounter: Payer: 59 | Attending: Registered Nurse | Admitting: Registered Nurse

## 2023-06-07 VITALS — BP 111/61 | HR 96 | Ht 61.0 in | Wt 138.6 lb

## 2023-06-07 DIAGNOSIS — M797 Fibromyalgia: Secondary | ICD-10-CM | POA: Diagnosis not present

## 2023-06-07 DIAGNOSIS — M25551 Pain in right hip: Secondary | ICD-10-CM | POA: Diagnosis not present

## 2023-06-07 DIAGNOSIS — Z5181 Encounter for therapeutic drug level monitoring: Secondary | ICD-10-CM | POA: Insufficient documentation

## 2023-06-07 DIAGNOSIS — G8929 Other chronic pain: Secondary | ICD-10-CM | POA: Insufficient documentation

## 2023-06-07 DIAGNOSIS — G894 Chronic pain syndrome: Secondary | ICD-10-CM | POA: Insufficient documentation

## 2023-06-07 DIAGNOSIS — Z79891 Long term (current) use of opiate analgesic: Secondary | ICD-10-CM | POA: Insufficient documentation

## 2023-06-07 DIAGNOSIS — M5416 Radiculopathy, lumbar region: Secondary | ICD-10-CM | POA: Diagnosis not present

## 2023-06-07 DIAGNOSIS — M546 Pain in thoracic spine: Secondary | ICD-10-CM | POA: Insufficient documentation

## 2023-06-07 DIAGNOSIS — M545 Low back pain, unspecified: Secondary | ICD-10-CM | POA: Insufficient documentation

## 2023-06-07 MED ORDER — MORPHINE SULFATE 15 MG PO TABS: ORAL_TABLET | ORAL | 0 refills | Status: DC

## 2023-06-07 NOTE — Progress Notes (Signed)
Subjective:    Patient ID: Robin Morgan, female    DOB: 04-11-1956, 67 y.o.   MRN: [id number]  HPI: Robin Morgan is a 67 y.o. female who returns for follow up appointment for chronic pain and medication refill. She states her  pain is located in her mid- lower back radiating into her right lower extremity, she reports increase intensity of lower back pain. Lasxt Lumbar X-ray reviewed radiologist recommend cross sectional imaging, order placed. Robin Morgan understanding.   She also reports right ischial pain, she refuses appointment with Dr Wynn Banker for injection for her ischial pain, she will call or send My-Chart message . She rates her pain 9.  Her current exercise regime is walking and performing stretching exercises.  Robin Morgan Morphine equivalent is 46.15 MME.   Last UDS was Performed on 04/06/2023, it was consistent.     Pain Inventory Average Pain 8 Pain Right Now 9 My pain is constant, sharp, and aching  In the last 24 hours, has pain interfered with the following? General activity 9 Relation with others 9 Enjoyment of life 8 What TIME of day is your pain at its worst? morning , evening, and night Sleep (in general) Poor  Pain is worse with: walking, inactivity, and standing Pain improves with: medication Relief from Meds: 8  Family History  Problem Relation Age of Onset   Alzheimer's disease Mother    Diabetes Father    Heart disease Maternal Grandmother    Breast cancer Paternal Grandmother 58   Social History   Socioeconomic History   Marital status: Divorced    Spouse name: Not on file   Number of children: 1   Years of education: Not on file   Highest education level: Not on file  Occupational History   Occupation: disabled  Tobacco Use   Smoking status: Never   Smokeless tobacco: Never  Vaping Use   Vaping status: Never Used  Substance and Sexual Activity   Alcohol use: No   Drug use: No   Sexual activity: Not Currently  Other  Topics Concern   Not on file  Social History Narrative   Not on file   Social Determinants of Health   Financial Resource Strain: Low Risk  (02/18/2023)   Overall Financial Resource Strain (CARDIA)    Difficulty of Paying Living Expenses: Not hard at all  Food Insecurity: No Food Insecurity (02/18/2023)   Hunger Vital Sign    Worried About Running Out of Food in the Last Year: Never true    Ran Out of Food in the Last Year: Never true  Transportation Needs: No Transportation Needs (02/18/2023)   PRAPARE - Administrator, Civil Service (Medical): No    Lack of Transportation (Non-Medical): No  Physical Activity: Inactive (02/18/2023)   Exercise Vital Sign    Days of Exercise per Week: 0 days    Minutes of Exercise per Session: 0 min  Stress: No Stress Concern Present (02/18/2023)   [employer] of Occupational Health - Occupational Stress Questionnaire    Feeling of Stress : Only a little  Social Connections: Socially Isolated (02/18/2023)   Social Connection and Isolation Panel [NHANES]    Frequency of Communication with Friends and Family: More than three times a week    Frequency of Social Gatherings with Friends and Family: Once a week    Attends Religious Services: Never    Database administrator or Organizations: No    Attends Club or  Organization Meetings: Never    Marital Status: Divorced   Past Surgical History:  Procedure Laterality Date   ABDOMINAL HYSTERECTOMY     1986   APPENDECTOMY     1983   BREAST EXCISIONAL BIOPSY Right 1990's   BREAST SURGERY     rt breast cyst done in the 90's   CHOLECYSTECTOMY     GALLBLADDER SURGERY     1983   ROTATOR Morgan REPAIR     right   SPINE SURGERY     TONSILLECTOMY     1973   Past Surgical History:  Procedure Laterality Date   ABDOMINAL HYSTERECTOMY     1986   APPENDECTOMY     1983   BREAST EXCISIONAL BIOPSY Right 1990's   BREAST SURGERY     rt breast cyst done in the 90's   CHOLECYSTECTOMY      GALLBLADDER SURGERY     1983   ROTATOR Morgan REPAIR     right   SPINE SURGERY     TONSILLECTOMY     1973   Past Medical History:  Diagnosis Date   Calcific tendonitis    Cervical spondylosis without myelopathy    Depression    Diabetes mellitus    Esophageal stricture    Fibromyalgia    GERD (gastroesophageal reflux disease)    Hiatal hernia    Hyperlipidemia    Hypertension    BP 111/61   Pulse 96   Ht 5\' 1"  (1.549 m)   Wt 138 lb 9.6 oz (62.9 kg)   SpO2 95%   BMI 26.19 kg/m   Opioid Risk Score:   Fall Risk Score:  `1  Depression screen [facility] 2/9     06/07/2023   11:37 AM 04/22/2023   11:32 AM 04/22/2023   11:29 AM 02/18/2023    1:08 PM 12/29/2022    1:25 PM 12/28/2022    1:59 PM 12/03/2022    1:08 PM  Depression screen PHQ 2/9  Decreased Interest 3 3 0 0 1 2 2   Down, Depressed, Hopeless 3 2 0 0 1 2 1   PHQ - 2 Score 6 5 0 0 2 4 3   Altered sleeping  2 0 1  1 2   Tired, decreased energy  3 0 3  3 2   Change in appetite  1 0 2  2 3   Feeling bad or failure about yourself   0 0 1  1 1   Trouble concentrating  0 0 1  1 1   Moving slowly or fidgety/restless  0 0 0  0 0  Suicidal thoughts  0 0 0  0 0  PHQ-9 Score  11 0 8  12 12   Difficult doing work/chores  Very difficult Not difficult at all Somewhat difficult  Somewhat difficult Very difficult    Review of Systems  Constitutional: Negative.   HENT: Negative.    Eyes: Negative.   Respiratory: Negative.    Cardiovascular: Negative.   Gastrointestinal: Negative.   Endocrine: Negative.   Genitourinary: Negative.   Musculoskeletal:  Positive for arthralgias, back pain, myalgias and neck pain.  Skin: Negative.   Allergic/Immunologic: Negative.   Neurological: Negative.   Hematological: Negative.   Psychiatric/Behavioral:  Positive for dysphoric mood.   All other systems reviewed and are negative.      Objective:   Physical Exam Vitals and nursing note reviewed.  Constitutional:      Appearance: Normal  appearance.  Cardiovascular:     Rate and Rhythm: Normal rate and  regular rhythm.     Pulses: Normal pulses.     Heart sounds: Normal heart sounds.  Pulmonary:     Effort: Pulmonary effort is normal.     Breath sounds: Normal breath sounds.  Musculoskeletal:     Cervical back: Normal range of motion and neck supple.     Comments: Normal Muscle Bulk and Muscle Testing Reveals:  Upper Extremities: Full ROM and Muscle Strength 5/5  Thoracic Paraspinal Tenderness: T-7-T-9   Lumbar Paraspinal Tenderness: L-4-L-5 Lower Extremities: Full ROM and Muscle strength 5/5 Arises from Table slowly Narrow Based  Gait     Skin:    General: Skin is warm and dry.  Neurological:     Mental Status: She is alert and oriented to person, place, and time.  Psychiatric:        Mood and Affect: Mood normal.        Behavior: Behavior normal.         Assessment & Plan:  1.Acute Exacerbation of Chronic Low Back Pain: RX: Lumbar X-ray:Cross Sectional Imaging.  Continue to Monitor. 06/07/2023 2. Fibromyalgia with myofascial pain: Continue home exercise program and heat therapy. Continue current medication regimen with Flexeril. 06/07/2023. 3. 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 06/07/2023 4. Cervicalgia. Post-laminectomy syndrome, facet arthropathy: Cervical Radiculopathy: Continue current medication regimen with Gabapentin. 06/07/2023  S/ P  EMG with Dr. Riley Kill on 03/08/2017: Diagnosed with Moderate to Severe CTS of Left wrist: 06/07/2023. 5.. Depression: Continue current medication regimen with Cymbalta. PCP Following. Current  dose 90 mg daily. 06/07/2023 6. Mid/low back pain/ Lumbar Spondylosis: Continue Current Medication and stretching and heat therapy. 06/07/2023 Refilled: Morphine Sulfate IR 15 mg one tablet three times  a day as needed for pain # 80.  7. Lumbar Radiculopathy:  Continue current medication regimen with Gabapentin. 06/07/2023 8. OA  of right hand: Continue Voltaren gel/ May substitute with Voltaren Tablet, she realizes she can't use both and verbalizes understanding. Continue with  heat therapy. 06/07/2023 9. Migraines: Continue with headache Journal.  Continue to Monitor  06/07/2023. 10 Left CTS:S/P EMG on 03/08/2017: S/P Carpal Tunnel Release on 06/14/2017 via . Dr. Orlan Leavens. 06/07/2023 11.. Muscle Spasm: Continue Tizanidine  Continue to Monitor. 06/07/2023 12. Left Shoulder Pain: Ortho Following: Her scheduled  surgery is pending at this time.  Continue HEP as Tolerated. Continue to Monitor. 06/07/2023  13. Right Ischial Pain: Ms. Hara refuses evaluation with Dr Wynn Banker at this time to discuss injection. We will continue to monitor.     F/U in 2 months

## 2023-06-08 ENCOUNTER — Other Ambulatory Visit: Payer: Self-pay | Admitting: Family Medicine

## 2023-06-10 ENCOUNTER — Encounter: Payer: 59 | Admitting: Registered Nurse

## 2023-06-11 MED ORDER — MORPHINE SULFATE 15 MG PO TABS: ORAL_TABLET | ORAL | 0 refills | Status: DC

## 2023-06-14 ENCOUNTER — Other Ambulatory Visit: Payer: Self-pay | Admitting: Physical Medicine & Rehabilitation

## 2023-06-14 DIAGNOSIS — G43019 Migraine without aura, intractable, without status migrainosus: Secondary | ICD-10-CM

## 2023-06-15 ENCOUNTER — Ambulatory Visit: Admission: RE | Admit: 2023-06-15 | Payer: 59 | Source: Ambulatory Visit

## 2023-07-02 ENCOUNTER — Telehealth: Payer: Self-pay

## 2023-07-02 NOTE — Patient Outreach (Signed)
Attempted to contact patient regarding dm eye. Left voicemail for patient to return my call at [phone].  Nicholes Rough, CMA Care Guide [employer]

## 2023-07-05 ENCOUNTER — Encounter: Payer: 59 | Attending: Registered Nurse | Admitting: Psychology

## 2023-07-05 ENCOUNTER — Other Ambulatory Visit: Payer: Self-pay | Admitting: Family Medicine

## 2023-07-05 DIAGNOSIS — M797 Fibromyalgia: Secondary | ICD-10-CM | POA: Insufficient documentation

## 2023-07-05 DIAGNOSIS — F339 Major depressive disorder, recurrent, unspecified: Secondary | ICD-10-CM | POA: Diagnosis not present

## 2023-07-05 DIAGNOSIS — M546 Pain in thoracic spine: Secondary | ICD-10-CM | POA: Diagnosis present

## 2023-07-05 DIAGNOSIS — G8929 Other chronic pain: Secondary | ICD-10-CM | POA: Diagnosis present

## 2023-07-05 DIAGNOSIS — G894 Chronic pain syndrome: Secondary | ICD-10-CM | POA: Diagnosis present

## 2023-07-05 DIAGNOSIS — M5416 Radiculopathy, lumbar region: Secondary | ICD-10-CM | POA: Diagnosis present

## 2023-07-06 ENCOUNTER — Other Ambulatory Visit: Payer: Self-pay

## 2023-07-06 ENCOUNTER — Other Ambulatory Visit: Payer: Self-pay | Admitting: Family Medicine

## 2023-07-06 ENCOUNTER — Telehealth: Payer: Self-pay | Admitting: Family Medicine

## 2023-07-06 DIAGNOSIS — N644 Mastodynia: Secondary | ICD-10-CM

## 2023-07-06 DIAGNOSIS — Z1231 Encounter for screening mammogram for malignant neoplasm of breast: Secondary | ICD-10-CM

## 2023-07-06 NOTE — Telephone Encounter (Signed)
Order has been placed for mammo

## 2023-07-06 NOTE — Telephone Encounter (Signed)
[facility], Patient needs an Order for a Diagnostic Mammogram and will faxing form to Dr. Mardelle Matte

## 2023-07-09 ENCOUNTER — Encounter: Payer: Self-pay | Admitting: Psychology

## 2023-07-09 NOTE — Progress Notes (Signed)
Neuropsychology Visit  Patient:  Robin Morgan   DOB: June 26, 1956  MR Number: [id number]  Location: [facility] A DEPT OF [facility] [address] [place], [place] [address] [place] [address] Dept: [phone]  Date of Service: 07/05/2023  Start: 1 PM End: To p.m.  Duration of Service: 1 Hour  Today's visit was conducted in my outpatient clinic office with the patient myself present.  Today's visit was an in person visit.  Provider/Observer:     Hershal Coria PsyD  Chief Complaint:      Chief Complaint  Patient presents with   Anxiety   Depression   Neck Pain   Pain   Back Pain   Stress   Sleeping Problem    Reason For Service:     Robin Morgan is a 67 year old right-handed female referred by Dr. Riley Kill for neuropsychological/psychological consultation due to significant depression and anxiety with primary stressors related to family issues in the setting of fibromyalgia and chronic severe pain symptoms.  The patient reports that she has had significant pain in her back primarily mid back for 20 years or more.  The patient is also been diagnosed with fibromyalgia.  The patient had surgery on her neck to try to alleviate help with her severe recurrent headaches but experience little change post surgery.  The patient also has significant shoulder pain and severe headache with these pain and headaches resulting in her having to spend multiple days at a time essentially in bed.  The patient is continued to struggle with anxiety and depressive symptoms on top of her chronic pain disorder.  The patient was asked to help take care of her father who has dementia and her sisters expected her to stay there for an extended period of time.  The patient was there for almost a week experiencing increasing problems due to poor sleeping conditions and an exacerbation of her underlying chronic pain  symptoms.  There continues to be stressors and animosity between the patient and her siblings.  There continue to be a lot of stress between the patient and her sisters particularly with 1 sister as they continue to struggle with managing the care of her father.  The patient had a fall with concussive event and neck injury/exacerbation of previous issues and worsening of shoulder pain.  The patient is struggled with managing her pain.  Treatment Interventions:  Therapeutic interventions for issues related to chronic pain/fibromyalgia and recurrent headaches and depression anxiety symptoms with significant psychosocial stressors.  Participation Level:   Active  Participation Quality:  Appropriate and Attentive      Behavioral Observation:  Well Groomed, Alert, and Appropriate.   Current Psychosocial Factors: The patient continues to have a lot of stress around her father's deteriorating health and one of her sisters that likely has significant personality disorder that causes a great deal of stress within the family structure.  The patient is doing as much as she can to help with her father but her physical limitations play significant negative role in her capacity.  Content of Session:   Reviewed current symptoms and continue to develop coping skills around her chronic pain and fibromyalgia symptoms as well as headaches and dealing with cervical postlaminectomy syndrome.  We also worked on significant psychosocial stressors that are being impacted by the patient's limited physical functioning as well as her stressors having a significant deleterious on her overall  pain symptoms.  Effectiveness of Interventions: The patient has been quite open and active in these therapeutic processes and rapport been easily established.  The patient's cognitive function is good and while stress affects her ability to stay focused this is not indicative of any type of cognitive deficits with the patient.  The patient  is already working on some of the initial issues we have focused around sleep hygiene, physical activity and other coping issues around her chronic pain symptoms.  Target Goals:   Goals include building better coping and adaptive skills around her chronic pain symptoms and working on better pain management as well as working on significant psychosocial stressors that are negatively impacting her current medical issues.  Goals Last Reviewed:   07/05/2023  Goals Addressed Today:    Today we worked a lot on the various stressors in her life that are exacerbating her chronic pain symptoms.  Impression/Diagnosis:   Robin Gustaveson. Morgan is a 67 year old right-handed female referred by Dr. Riley Kill for neuropsychological/psychological consultation due to significant depression and anxiety with primary stressors related to family issues in the setting of fibromyalgia and chronic severe pain symptoms.  The patient reports that she has had significant pain in her back primarily mid back for 20 years or more.  The patient is also been diagnosed with fibromyalgia.  The patient had surgery on her neck to try to alleviate help with her severe recurrent headaches but experience little change post surgery.  The patient also has significant shoulder pain and severe headache with these pain and headaches resulting in her having to spend multiple days at a time essentially in bed.   I do think that the patient's anxiety, stress and depressive symptoms do play and exacerbating role in her overall pain symptoms and while she clearly has abnormalities in lumbar and cervical regions as a primary factor for her pain symptoms her stress responses, fibromyalgia and depression do play a role in the acute day-to-day level of her pain.  Today we have continue to work on therapeutic interventions around chronic pain and building better coping strategies for the numerous stressors that she is facing particularly around issues with her  family and the impact that stress has on her chronic pain difficulties.  Diagnosis:   Depression, recurrent (HCC)  Chronic pain syndrome  Chronic bilateral thoracic back pain  Fibromyalgia  Right lumbar radiculitis    Arley Phenix, Psy.D. Clinical Psychologist Neuropsychologist

## 2023-07-21 ENCOUNTER — Encounter: Payer: Self-pay | Admitting: Gastroenterology

## 2023-07-21 ENCOUNTER — Other Ambulatory Visit (INDEPENDENT_AMBULATORY_CARE_PROVIDER_SITE_OTHER): Payer: 59

## 2023-07-21 ENCOUNTER — Ambulatory Visit (INDEPENDENT_AMBULATORY_CARE_PROVIDER_SITE_OTHER): Payer: 59 | Admitting: Gastroenterology

## 2023-07-21 VITALS — BP 110/56 | HR 103 | Ht 61.0 in | Wt 140.5 lb

## 2023-07-21 DIAGNOSIS — R1012 Left upper quadrant pain: Secondary | ICD-10-CM | POA: Diagnosis not present

## 2023-07-21 DIAGNOSIS — R1310 Dysphagia, unspecified: Secondary | ICD-10-CM

## 2023-07-21 DIAGNOSIS — K5900 Constipation, unspecified: Secondary | ICD-10-CM | POA: Diagnosis not present

## 2023-07-21 LAB — BASIC METABOLIC PANEL
BUN: 24 mg/dL — ABNORMAL HIGH (ref 6–23)
CO2: 26 meq/L (ref 19–32)
Calcium: 8.9 mg/dL (ref 8.4–10.5)
Chloride: 108 meq/L (ref 96–112)
Creatinine, Ser: 1.01 mg/dL (ref 0.40–1.20)
GFR: 56.66 mL/min — ABNORMAL LOW (ref >60.00)
Glucose, Bld: 224 mg/dL — ABNORMAL HIGH (ref 70–99)
Potassium: 4.1 meq/L (ref 3.5–5.1)
Sodium: 142 meq/L (ref 135–145)

## 2023-07-21 NOTE — Patient Instructions (Addendum)
You have been scheduled for an endoscopy. Please follow written instructions given to you at your visit today.  If you use inhalers (even only as needed), please bring them with you on the day of your procedure. ___________________________________________________  You have been scheduled for a CT scan of the abdomen and pelvis at [facility], 1st floor Radiology. You are scheduled on 12/17 at 9:00 am . You should arrive 15-20 minutes prior to your appointment time for registration.  We are giving you 2 bottles of contrast today that you will need to drink before arriving for the exam. The solution may taste better if refrigerated so put them in the refrigerator when you get home, but do NOT add ice or any other liquid to this solution as that would dilute it. Shake well before drinking.   If you have any questions regarding your exam or if you need to reschedule, you may call [facility] at [phone] between the hours of 8:00 am and 5:00 pm, Monday-Friday.   [facility]- take up to 4 times daily  Due to recent changes in healthcare laws, you may see the results of your imaging and laboratory studies on MyChart before your provider has had a chance to review them.  We understand that in some cases there may be results that are confusing or concerning to you. Not all laboratory results come back in the same time frame and the provider may be waiting for multiple results in order to interpret others.  Please give [place] 48 hours in order for your provider to thoroughly review all the results before contacting the office for clarification of your results.   It was a pleasure to see you today!  Thank you for trusting me with your gastrointestinal care!

## 2023-07-21 NOTE — Progress Notes (Signed)
Chief Complaint: Primary GI MD: previous [facility] - transferring to Dr. Tomasa Rand  HPI: 67 year old female history of GERD, esophageal stricture, diabetes (on insulin, cervical spondylosis, presents for evaluation of  Previous patient of [facility].  Patient has had chronic symptoms of dysphagia, constipation, bloating.  She has a history of chronic pain/opioid dependence.  She underwent EGD in 2019 which was unremarkable but she did have empiric dilation to 18 mm.  Her dysphagia was felt to be related to dysmotility.  She had repeat EGD in 2022 with dilation but that procedure is not included in transfer records.  She also has had a normal gastric emptying study in the past.  History of C. difficile March 2024 diagnosed by PCP initially treated with Flagyl then switched to vancomycin. This was after being on amoxicillin in February for a dog bite.  Recent blood work CBC and CMP May 2024 were unremarkable  -----TODAY-----  Patient states she has had longstanding dysphagia for many years and it is mainly to solid food gets stuck in her suprasternal notch and has to be chased by liquids to move down correctly.  This is not a daily occurrence but may occur about once a week.  Offending foods include rice and then sometimes pills can be difficult for her.  She states when she went for her endoscopy in 2022 when she woke up from her anesthesia she was frustrated as the provider did not do an empiric dilation and she has continued to have similar dysphagia since.  She states when she had her endoscopy in 2019 and had empiric dilation with Dr. Madilyn Fireman she had significant improvement that lasted a couple of years.  Denies GERD.  Takes esomeprazole 40 Mg twice daily and feels this is no longer helping her dysphagia.  She reports chronic constipation that has been worse since her episode of C. difficile earlier this year.  She states she is on longstanding morphine for chronic pain and she has tried a high  dose of Movantik which caused diarrhea.  She is also tried MiraLAX without relief.  She states she can go up 2 weeks without a bowel movement and she also has associated LLQ pain.  She is very concerned about this pain and feels she needs another colonoscopy as she is concerned it colon cancer may have grown since her last colonoscopy.  She states she had 3 small polyps on her last colonoscopy was told to come back in 5 years.  Denies family history of colon cancer.  Denies weight loss.  Denies rectal bleeding.  PREVIOUS GI WORKUP   Colonoscopy 2017 with 8 mm tubular adenoma  EGD 2019 unremarkable, empiric dilation to 18 mm  Colonoscopy 2022-this procedure record is not included in transfer of care  Past Medical History:  Diagnosis Date   Calcific tendonitis    Cervical spondylosis without myelopathy    Depression    Diabetes mellitus    Esophageal stricture    Fibromyalgia    GERD (gastroesophageal reflux disease)    Hiatal hernia    Hyperlipidemia    Hypertension     Past Surgical History:  Procedure Laterality Date   ABDOMINAL HYSTERECTOMY     1986   APPENDECTOMY     1983   BREAST EXCISIONAL BIOPSY Right 1990's   BREAST SURGERY     rt breast cyst done in the 90's   CHOLECYSTECTOMY     GALLBLADDER SURGERY     1983   ROTATOR CUFF REPAIR  right   SPINE SURGERY     TONSILLECTOMY     1973    Current Outpatient Medications  Medication Sig Dispense Refill   atorvastatin (LIPITOR) 40 MG tablet TAKE ONE TABLET BY MOUTH ONCE A DAY 100 tablet 3   Calcium Carbonate-Vitamin D (CALCIUM-VITAMIN D) 500-200 MG-UNIT per tablet Take 1 tablet by mouth 2 (two) times daily with a meal.     carboxymethylcellul-glycerin (OPTIVE) 0.5-0.9 % ophthalmic solution Apply to eye.     Cholecalciferol (VITAMIN D3) 50 MCG (2000 UT) CAPS Take 2 capsules by mouth daily.     co-enzyme Q-10 30 MG capsule Take 30 mg by mouth at bedtime.     cyclobenzaprine (FLEXERIL) 10 MG tablet one tablet      dapagliflozin propanediol (FARXIGA) 10 MG TABS tablet TAKE ONE TABLET BY MOUTH ONCE A DAY (NEED OFFICE VISIT FOR REFILLS) 90 tablet 3   diclofenac Sodium (VOLTAREN) 1 % GEL      DULoxetine (CYMBALTA) 30 MG capsule TAKE THREE CAPSULES BY MOUTH DAILY 270 capsule 3   esomeprazole (NEXIUM) 40 MG capsule TAKE ONE CAPSULE BY MOUTH TWO TIMES DAILY BEFORE A MEAL MORNING AND EVENING 180 capsule 1   fenofibrate 160 MG tablet TAKE ONE TABLET BY MOUTH ONCE A DAY 90 tablet 3   gabapentin (NEURONTIN) 300 MG capsule TAKE TWO CAPSULES BY MOUTH IN THE MORNING, TWO CAPSULES AT LUNCH AND TWO CAPSULES AT BEDTIME 180 capsule 3   insulin degludec (TRESIBA FLEXTOUCH) 200 UNIT/ML FlexTouch Pen Inject 30 Units into the skin 2 (two) times daily.     levothyroxine (SYNTHROID) 50 MCG tablet 1 tablet Orally Once a day     lisinopril (ZESTRIL) 5 MG tablet TAKE 1 TABLETS BY MOUTH DAILY 90 tablet 3   Magnesium Gluconate (MAGNESIUM 27 PO) Take by mouth.     magnesium gluconate (MAGONATE) 500 MG tablet Take 500 mg by mouth 2 (two) times daily.     metFORMIN (GLUCOPHAGE) 1000 MG tablet TAKE 1 TABLET BY MOUTH TWICE A DAY WITH MEALS 180 tablet 3   morphine (MSIR) 15 MG tablet TAKE ONE TABLET BY MOUTH THREE TIMES DAILY AS NEEDED FOR MODERATE PAIN. 80 tablet 0   naloxegol oxalate (MOVANTIK) 25 MG TABS tablet Take by mouth.     Omega-3 Fatty Acids (FISH OIL) 1000 MG CAPS      RESTASIS 0.05 % ophthalmic emulsion Place 1 drop into both eyes daily.     SUPER B COMPLEX/C PO Take by mouth.     tiZANidine (ZANAFLEX) 2 MG tablet TAKE 1/2 TO 1 TABLET (1-2 MG TOTAL) BY MOUTH EVERY 6 HOURS AS NEEDED FOR MUSCLE SPASMS 60 tablet 2   topiramate (TOPAMAX) 100 MG tablet TAKE ONE TABLET BY MOUTH EVERY NIGHT AT BEDTIME 30 tablet 2   vitamin B-12 (CYANOCOBALAMIN) 1000 MCG tablet Take 1,000 mcg by mouth daily.     zolpidem (AMBIEN) 10 MG tablet TAKE ONE TABLET BY MOUTH EVERY NIGHT AT BEDTIME AS NEEDED FOR SLEEP 30 tablet 5   No current  facility-administered medications for this visit.    Allergies as of 07/21/2023 - Review Complete 07/09/2023  Allergen Reaction Noted   Compazine Other (See Comments) 08/25/2011   Prochlorperazine Other (See Comments) 12/19/2010   Amoxicillin Diarrhea 12/29/2022   Emetrol  11/08/2020   Other Other (See Comments) 09/02/2022   Prochlorperazine edisylate  04/23/2021   Prochlorperazine maleate  04/23/2021   Naproxen Itching 08/25/2011    Family History  Problem Relation Age of Onset  Alzheimer's disease Mother    Diabetes Father    Heart disease Maternal Grandmother    Breast cancer Paternal Grandmother 41    Social History   Socioeconomic History   Marital status: Divorced    Spouse name: Not on file   Number of children: 1   Years of education: Not on file   Highest education level: Not on file  Occupational History   Occupation: disabled  Tobacco Use   Smoking status: Never   Smokeless tobacco: Never  Vaping Use   Vaping status: Never Used  Substance and Sexual Activity   Alcohol use: No   Drug use: No   Sexual activity: Not Currently  Other Topics Concern   Not on file  Social History Narrative   Not on file   Social Determinants of Health   Financial Resource Strain: Low Risk  (02/18/2023)   Overall Financial Resource Strain (CARDIA)    Difficulty of Paying Living Expenses: Not hard at all  Food Insecurity: No Food Insecurity (02/18/2023)   Hunger Vital Sign    Worried About Running Out of Food in the Last Year: Never true    Ran Out of Food in the Last Year: Never true  Transportation Needs: No Transportation Needs (02/18/2023)   PRAPARE - Administrator, Civil Service (Medical): No    Lack of Transportation (Non-Medical): No  Physical Activity: Inactive (02/18/2023)   Exercise Vital Sign    Days of Exercise per Week: 0 days    Minutes of Exercise per Session: 0 min  Stress: No Stress Concern Present (02/18/2023)   [employer] of  Occupational Health - Occupational Stress Questionnaire    Feeling of Stress : Only a little  Social Connections: Socially Isolated (02/18/2023)   Social Connection and Isolation Panel [NHANES]    Frequency of Communication with Friends and Family: More than three times a week    Frequency of Social Gatherings with Friends and Family: Once a week    Attends Religious Services: Never    Database administrator or Organizations: No    Attends Banker Meetings: Never    Marital Status: Divorced  Catering manager Violence: Not At Risk (02/18/2023)   Humiliation, Afraid, Rape, and Kick questionnaire    Fear of Current or Ex-Partner: No    Emotionally Abused: No    Physically Abused: No    Sexually Abused: No    Review of Systems:    Constitutional: No weight loss, fever, chills, weakness or fatigue HEENT: Eyes: No change in vision               Ears, Nose, Throat:  No change in hearing or congestion Skin: No rash or itching Cardiovascular: No chest pain, chest pressure or palpitations   Respiratory: No SOB or cough Gastrointestinal: See HPI and otherwise negative Genitourinary: No dysuria or change in urinary frequency Neurological: No headache, dizziness or syncope Musculoskeletal: No new muscle or joint pain Hematologic: No bleeding or bruising Psychiatric: No history of depression or anxiety    Physical Exam:  Vital signs: There were no vitals taken for this visit.  Constitutional: NAD, Well developed, Well nourished, alert and cooperative Head:  Normocephalic and atraumatic. Eyes:   PEERL, EOMI. No icterus. Conjunctiva pink. Respiratory: Respirations even and unlabored. Lungs clear to auscultation bilaterally.   No wheezes, crackles, or rhonchi.  Cardiovascular:  Regular rate and rhythm. No peripheral edema, cyanosis or pallor.  Gastrointestinal:  Soft, nondistended, nontender. No rebound  or guarding.  Hypoactive bowel sounds. No appreciable masses or  hepatomegaly. Rectal:  Not performed.  Msk:  Symmetrical without gross deformities. Without edema, no deformity or joint abnormality.  Neurologic:  Alert and  oriented x4;  grossly normal neurologically.  Skin:   Dry and intact without significant lesions or rashes. Psychiatric: Oriented to person, place and time. Demonstrates good judgement and reason without abnormal affect or behaviors.   RELEVANT LABS AND IMAGING: CBC    Component Value Date/Time   WBC 10.0 12/28/2022 1424   RBC 4.98 12/28/2022 1424   HGB 13.5 12/28/2022 1424   HGB 13.3 09/22/2016 1013   HCT 43.6 12/28/2022 1424   HCT 41.2 09/22/2016 1013   PLT 378.0 12/28/2022 1424   PLT 419 (H) 09/22/2016 1013   MCV 87.5 12/28/2022 1424   MCV 86 09/22/2016 1013   MCH 26.1 (L) 05/27/2020 1200   MCHC 31.1 12/28/2022 1424   RDW 15.7 (H) 12/28/2022 1424   RDW 14.1 09/22/2016 1013   LYMPHSABS 3.1 12/28/2022 1424   MONOABS 0.7 12/28/2022 1424   EOSABS 0.2 12/28/2022 1424   BASOSABS 0.1 12/28/2022 1424    CMP     Component Value Date/Time   NA 141 12/28/2022 1424   NA 140 09/22/2016 1013   K 4.4 12/28/2022 1424   CL 105 12/28/2022 1424   CO2 26 12/28/2022 1424   GLUCOSE 130 (H) 12/28/2022 1424   BUN 14 12/28/2022 1424   BUN 18 09/22/2016 1013   CREATININE 0.88 12/28/2022 1424   CREATININE 0.86 05/27/2020 1200   CALCIUM 10.0 12/28/2022 1424   PROT 7.0 12/28/2022 1424   PROT 7.2 09/22/2016 1013   ALBUMIN 4.4 12/28/2022 1424   ALBUMIN 4.7 09/22/2016 1013   AST 23 12/28/2022 1424   ALT 14 12/28/2022 1424   ALKPHOS 58 12/28/2022 1424   BILITOT 0.6 12/28/2022 1424   BILITOT 0.4 09/22/2016 1013   GFRNONAA 70 05/27/2020 1200   GFRAA 82 05/27/2020 1200     Assessment/Plan:   Chronic dysphagia EGD with empiric dilation to 18 mm in 2019 with improvement for a few years and then repeat EGD in 2022 without dilation though we do not have this record available.  Dysphagia to solids occurring at least once a week getting  stuck in her suprasternal notch.  No improvement on PPI.  Discussed possibility of barium swallow versus EGD with patient.  She would like an EGD with empiric dilation as that has helped in the past.  I think this is reasonable as well. - EGD with possible empiric dilation for further evaluation - I thoroughly discussed the procedure with the patient (at bedside) to include nature of the procedure, alternatives, benefits, and risks (including but not limited to bleeding, infection, perforation, anesthesia/cardiac pulmonary complications).  Patient verbalized understanding and gave verbal consent to proceed with procedure. - If no improvement after EGD may need to consider barium swallow versus esophageal manometry.  Though it does sound like this may be a motility issue for her.  Chronic Constipation secondary to opioid dependence LLQ pain Chronic constipation going weeks without a bowel movement and on regular morphine for chronic pain.  Associated LLQ pain.  Discussed getting on a regular bowel regimen to prevent constipation.  Patient is very concerned and feels she needs a colonoscopy.  Reassurance was provided as she has recently had a colonoscopy within the last 2 years with reported 3 small polyps.  Will obtain these records.  Discussed getting an x-ray to provide  reassurance.  She feels a CT scan would be more useful. - CT abdomen pelvis with contrast for further evaluation - Movantik 12.5 Mg once daily - Obtain previous colonoscopy record from 2022 (suspect she is due for repeat in 2027)   Boone Master, PA-C [facility] 07/21/2023, 9:11 AM  Cc: Willow Ora, MD

## 2023-07-22 ENCOUNTER — Telehealth: Payer: Self-pay | Admitting: Gastroenterology

## 2023-07-22 NOTE — Telephone Encounter (Signed)
Contacted pt and pt verbalized understanding that she did not need to drink the oral contrast per Radiology.

## 2023-07-22 NOTE — Telephone Encounter (Signed)
Inbound call from patient requesting a call to be advised on how to receive contrast for 12/17 CT. Please advise, thank you.

## 2023-07-27 ENCOUNTER — Ambulatory Visit
Admission: RE | Admit: 2023-07-27 | Discharge: 2023-07-27 | Disposition: A | Discharge status: Home / Self Care | Payer: 59 | Source: Ambulatory Visit | Attending: Gastroenterology | Admitting: Gastroenterology

## 2023-07-27 ENCOUNTER — Other Ambulatory Visit: Payer: 59

## 2023-07-27 DIAGNOSIS — R1310 Dysphagia, unspecified: Secondary | ICD-10-CM | POA: Diagnosis not present

## 2023-07-27 DIAGNOSIS — R1012 Left upper quadrant pain: Secondary | ICD-10-CM | POA: Diagnosis not present

## 2023-07-27 DIAGNOSIS — K5900 Constipation, unspecified: Secondary | ICD-10-CM | POA: Insufficient documentation

## 2023-07-27 DIAGNOSIS — Z9049 Acquired absence of other specified parts of digestive tract: Secondary | ICD-10-CM | POA: Diagnosis not present

## 2023-07-27 MED ORDER — IOHEXOL 300 MG/ML  SOLN
100.0000 mL | Freq: Once | INTRAMUSCULAR | Status: AC | PRN
  Administered 2023-07-27: 100 mL via INTRAVENOUS

## 2023-07-27 NOTE — Progress Notes (Signed)
Agree with the assessment and plan as outlined by Boone Master, PA-C.  Agree that colonoscopy to evaluate for colon cancer as cause of abdominal pain is very low yield in patient that had colonoscopy 2 years ago with low risk polyps. If patient does not experience improvement in swallowing following dysphagia, would have suspicion for opioid induced esophageal dysmotility.  Could consider esophageal manometry if patient desires.

## 2023-08-05 ENCOUNTER — Other Ambulatory Visit: Payer: Self-pay | Admitting: Family Medicine

## 2023-08-06 ENCOUNTER — Other Ambulatory Visit: Payer: Self-pay | Admitting: *Deleted

## 2023-08-06 MED ORDER — NALOXEGOL OXALATE 12.5 MG PO TABS: 12.5000 mg | ORAL_TABLET | Freq: Every day | ORAL | 2 refills | Status: AC

## 2023-08-06 NOTE — Progress Notes (Signed)
movanti

## 2023-08-09 ENCOUNTER — Encounter: Payer: 59 | Attending: Registered Nurse | Admitting: Registered Nurse

## 2023-08-09 ENCOUNTER — Other Ambulatory Visit: Payer: Self-pay | Admitting: Registered Nurse

## 2023-08-09 VITALS — BP 110/70 | HR 92 | Ht 61.0 in | Wt 140.0 lb

## 2023-08-09 DIAGNOSIS — Z79891 Long term (current) use of opiate analgesic: Secondary | ICD-10-CM | POA: Diagnosis not present

## 2023-08-09 DIAGNOSIS — M5416 Radiculopathy, lumbar region: Secondary | ICD-10-CM | POA: Insufficient documentation

## 2023-08-09 DIAGNOSIS — G894 Chronic pain syndrome: Secondary | ICD-10-CM | POA: Insufficient documentation

## 2023-08-09 DIAGNOSIS — G8929 Other chronic pain: Secondary | ICD-10-CM | POA: Diagnosis not present

## 2023-08-09 DIAGNOSIS — M542 Cervicalgia: Secondary | ICD-10-CM | POA: Diagnosis not present

## 2023-08-09 DIAGNOSIS — Z5181 Encounter for therapeutic drug level monitoring: Secondary | ICD-10-CM | POA: Insufficient documentation

## 2023-08-09 DIAGNOSIS — M546 Pain in thoracic spine: Secondary | ICD-10-CM | POA: Diagnosis not present

## 2023-08-09 DIAGNOSIS — M5412 Radiculopathy, cervical region: Secondary | ICD-10-CM | POA: Insufficient documentation

## 2023-08-09 DIAGNOSIS — M797 Fibromyalgia: Secondary | ICD-10-CM | POA: Diagnosis not present

## 2023-08-09 NOTE — Progress Notes (Unsigned)
Subjective:    Patient ID: Robin Morgan, female    DOB: 12/03/1955, 67 y.o.   MRN: [id number]  HPI: Robin Morgan is a 67 y.o. female who returns for follow up appointment for chronic pain and medication refill. states *** pain is located in  ***. rates pain ***. current exercise regime is walking and performing stretching exercises.  Robin Morgan Morphine equivalent is *** MME.   UDS Ordered today.    Pain Inventory Average Pain 9 Pain Right Now 8 My pain is sharp and aching  In the last 24 hours, has pain interfered with the following? General activity 9 Relation with others 7 Enjoyment of life 8 What TIME of day is your pain at its worst? morning , evening, and night Sleep (in general) Fair  Pain is worse with: walking, sitting, and standing Pain improves with: medication Relief from Meds: 6  Family History  Problem Relation Age of Onset   Alzheimer's disease Mother    Diabetes Father    Heart disease Maternal Grandmother    Breast cancer Paternal Grandmother 19   Social History   Socioeconomic History   Marital status: Divorced    Spouse name: Not on file   Number of children: 1   Years of education: Not on file   Highest education level: Not on file  Occupational History   Occupation: disabled  Tobacco Use   Smoking status: Never   Smokeless tobacco: Never  Vaping Use   Vaping status: Never Used  Substance and Sexual Activity   Alcohol use: No   Drug use: No   Sexual activity: Not Currently  Other Topics Concern   Not on file  Social History Narrative   Not on file   Social Drivers of Health   Financial Resource Strain: Low Risk  (02/18/2023)   Overall Financial Resource Strain (CARDIA)    Difficulty of Paying Living Expenses: Not hard at all  Food Insecurity: No Food Insecurity (02/18/2023)   Hunger Vital Sign    Worried About Running Out of Food in the Last Year: Never true    Ran Out of Food in the Last Year: Never true  Transportation  Needs: No Transportation Needs (02/18/2023)   PRAPARE - Administrator, Civil Service (Medical): No    Lack of Transportation (Non-Medical): No  Physical Activity: Inactive (02/18/2023)   Exercise Vital Sign    Days of Exercise per Week: 0 days    Minutes of Exercise per Session: 0 min  Stress: No Stress Concern Present (02/18/2023)   [employer] of Occupational Health - Occupational Stress Questionnaire    Feeling of Stress : Only a little  Social Connections: Socially Isolated (02/18/2023)   Social Connection and Isolation Panel [NHANES]    Frequency of Communication with Friends and Family: More than three times a week    Frequency of Social Gatherings with Friends and Family: Once a week    Attends Religious Services: Never    Database administrator or Organizations: No    Attends Banker Meetings: Never    Marital Status: Divorced   Past Surgical History:  Procedure Laterality Date   ABDOMINAL HYSTERECTOMY     1986   APPENDECTOMY     1983   BREAST EXCISIONAL BIOPSY Right 1990's   BREAST SURGERY     rt breast cyst done in the 90's   CHOLECYSTECTOMY     GALLBLADDER SURGERY     1983  ROTATOR CUFF REPAIR     right   SPINE SURGERY     TONSILLECTOMY     1973   Past Surgical History:  Procedure Laterality Date   ABDOMINAL HYSTERECTOMY     1986   APPENDECTOMY     1983   BREAST EXCISIONAL BIOPSY Right 1990's   BREAST SURGERY     rt breast cyst done in the 90's   CHOLECYSTECTOMY     GALLBLADDER SURGERY     1983   ROTATOR CUFF REPAIR     right   SPINE SURGERY     TONSILLECTOMY     1973   Past Medical History:  Diagnosis Date   Calcific tendonitis    Cervical spondylosis without myelopathy    Depression    Diabetes mellitus    Esophageal stricture    Fibromyalgia    GERD (gastroesophageal reflux disease)    Hiatal hernia    Hyperlipidemia    Hypertension    BP 110/70   Pulse 92   Ht 5\' 1"  (1.549 m)   Wt 140 lb (63.5 kg)    SpO2 92%   BMI 26.45 kg/m   Opioid Risk Score:   Fall Risk Score:  `1  Depression screen [facility] 2/9     06/07/2023   11:37 AM 04/22/2023   11:32 AM 04/22/2023   11:29 AM 02/18/2023    1:08 PM 12/29/2022    1:25 PM 12/28/2022    1:59 PM 12/03/2022    1:08 PM  Depression screen PHQ 2/9  Decreased Interest 3 3 0 0 1 2 2   Down, Depressed, Hopeless 3 2 0 0 1 2 1   PHQ - 2 Score 6 5 0 0 2 4 3   Altered sleeping  2 0 1  1 2   Tired, decreased energy  3 0 3  3 2   Change in appetite  1 0 2  2 3   Feeling bad or failure about yourself   0 0 1  1 1   Trouble concentrating  0 0 1  1 1   Moving slowly or fidgety/restless  0 0 0  0 0  Suicidal thoughts  0 0 0  0 0  PHQ-9 Score  11 0 8  12 12   Difficult doing work/chores  Very difficult Not difficult at all Somewhat difficult  Somewhat difficult Very difficult      Review of Systems  Musculoskeletal:  Positive for back pain and neck pain.       Bilateral shoulder pain  All other systems reviewed and are negative.     Objective:   Physical Exam        Assessment & Plan:  1. Fibromyalgia with myofascial pain: Continue home exercise program and heat therapy. Continue current medication regimen with Flexeril. 12/29/2022.  2. Rotator cuff syndrome on the right: Continue with stretching exercises and heat therapy. 12/29/2022 3. Cervicalgia. Post-laminectomy syndrome, facet arthropathy: Cervical Radiculopathy: Continue current medication regimen with Gabapentin. 12/29/2022  S/ P  EMG with Dr. Riley Kill on 03/08/2017: Diagnosed with Moderate to Severe CTS of Left wrist: 12/29/2022. 4. Depression: Continue current medication regimen with Cymbalta. PCP Following. Current  dose 90 mg daily. 12/29/2022 5. Mid/low back pain/ Lumbar Spondylosis: Continue Current Medication and stretching and heat therapy. 12/29/2022 Refilled: Morphine Sulfate IR 15 mg one tablet three times  a day as needed for pain # 80.  6. Lumbar Radiculopathy:  Continue current  medication regimen with Gabapentin. 12/29/2022 7. OA of right hand: Continue Voltaren gel/  May substitute with Voltaren Tablet, she realizes she can't use both and verbalizes understanding. Continue with  heat therapy. 12/29/2022 8. Migraines: Continue with headache Journal.  Continue to Monitor  12/29/2022. 9. Left CTS:S/P EMG on 03/08/2017: S/P Carpal Tunnel Release on 06/14/2017 via . Dr. Orlan Leavens. 12/29/2022 10.. Muscle Spasm: Continue Tizanidine  Continue to Monitor. 12/29/2022 11. Left Shoulder Pain: Ortho Following: Her scheduled  surgery is pending at this time.  Continue HEP as Tolerated. Continue to Monitor. 12/29/2022  12. Fall at Home: No falls since last visit. Continue to Monitor. 12/29/2022      F/U in 2 months

## 2023-08-11 LAB — TOXASSURE SELECT,+ANTIDEPR,UR

## 2023-08-17 ENCOUNTER — Other Ambulatory Visit: Payer: Self-pay | Admitting: Registered Nurse

## 2023-08-25 ENCOUNTER — Encounter: Payer: Self-pay | Admitting: Physical Medicine & Rehabilitation

## 2023-08-25 ENCOUNTER — Encounter: Payer: 59 | Attending: Registered Nurse | Admitting: Physical Medicine & Rehabilitation

## 2023-08-25 VITALS — BP 106/66 | HR 108 | Wt 139.0 lb

## 2023-08-25 DIAGNOSIS — M797 Fibromyalgia: Secondary | ICD-10-CM | POA: Diagnosis not present

## 2023-08-25 DIAGNOSIS — M5010 Cervical disc disorder with radiculopathy, unspecified cervical region: Secondary | ICD-10-CM | POA: Insufficient documentation

## 2023-08-25 DIAGNOSIS — G43019 Migraine without aura, intractable, without status migrainosus: Secondary | ICD-10-CM | POA: Diagnosis not present

## 2023-08-25 DIAGNOSIS — M961 Postlaminectomy syndrome, not elsewhere classified: Secondary | ICD-10-CM | POA: Diagnosis not present

## 2023-08-25 MED ORDER — METHOCARBAMOL 500 MG PO TABS: 500.0000 mg | ORAL_TABLET | Freq: Three times a day (TID) | ORAL | 2 refills | Status: DC

## 2023-08-25 MED ORDER — MORPHINE SULFATE 15 MG PO TABS: ORAL_TABLET | ORAL | 0 refills | Status: DC

## 2023-08-25 NOTE — Progress Notes (Signed)
 Subjective:    Patient ID: Robin Morgan, female    DOB: September 16, 1955, 68 y.o.   MRN: [id number]  HPI Robin Morgan is here in follow-up of her chronic pain.  When I last saw her about a year ago she was discussing cervical surgery with the neurosurgeon but also there were concerns over her shoulders.  She had some rotator cuff injury found on MRI and there was a plan for surgery there.  However she ran into some complications in regards to her preop antibiotics and intolerances of these.  Ultimately surgery did not happen.  She does do some exercises of her own at home but is not always consistent.  Still has some neck pain with radiation into the left arm.  She complains more of her low back bothering her today with intermittent radiation to her mid back when she bends or moves.  She takes tizanidine  as needed for muscle spasms but usually only takes it at bedtime as it makes her sleepy.  She remains on morphine  immediate release for her baseline control.  She is 20 MME's a day  Headaches have been improved with topamax . She takes 100mg  at bedtime. It's rare that she has a severe headache now.   Pain Inventory Average Pain 9 Pain Right Now 7 My pain is constant, sharp, and aching  In the last 24 hours, has pain interfered with the following? General activity 0 Relation with others 0 Enjoyment of life 10 What TIME of day is your pain at its worst? evening and night Sleep (in general) Poor  Pain is worse with: walking, bending, sitting, inactivity, standing, and some activites Pain improves with: rest and medication Relief from Meds: 7  Family History  Problem Relation Age of Onset   Alzheimer's disease Mother    Diabetes Father    Heart disease Maternal Grandmother    Breast cancer Paternal Grandmother 38   Social History   Socioeconomic History   Marital status: Divorced    Spouse name: Not on file   Number of children: 1   Years of education: Not on file   Highest education  level: Not on file  Occupational History   Occupation: disabled  Tobacco Use   Smoking status: Never   Smokeless tobacco: Never  Vaping Use   Vaping status: Never Used  Substance and Sexual Activity   Alcohol  use: No   Drug use: No   Sexual activity: Not Currently  Other Topics Concern   Not on file  Social History Narrative   Not on file   Social Drivers of Health   Financial Resource Strain: Low Risk  (02/18/2023)   Overall Financial Resource Strain (CARDIA)    Difficulty of Paying Living Expenses: Not hard at all  Food Insecurity: No Food Insecurity (02/18/2023)   Hunger Vital Sign    Worried About Running Out of Food in the Last Year: Never true    Ran Out of Food in the Last Year: Never true  Transportation Needs: No Transportation Needs (02/18/2023)   PRAPARE - Administrator, Civil Service (Medical): No    Lack of Transportation (Non-Medical): No  Physical Activity: Inactive (02/18/2023)   Exercise Vital Sign    Days of Exercise per Week: 0 days    Minutes of Exercise per Session: 0 min  Stress: No Stress Concern Present (02/18/2023)   [employer] of Occupational Health - Occupational Stress Questionnaire    Feeling of Stress : Only a little  Social Connections: Socially Isolated (02/18/2023)   Social Connection and Isolation Panel [NHANES]    Frequency of Communication with Friends and Family: More than three times a week    Frequency of Social Gatherings with Friends and Family: Once a week    Attends Religious Services: Never    Database administrator or Organizations: No    Attends Engineer, structural: Never    Marital Status: Divorced   Past Surgical History:  Procedure Laterality Date   ABDOMINAL HYSTERECTOMY     1986   APPENDECTOMY     1983   BREAST EXCISIONAL BIOPSY Right 1990's   BREAST SURGERY     rt breast cyst done in the 90's   CHOLECYSTECTOMY     GALLBLADDER SURGERY     1983   ROTATOR CUFF REPAIR     right   SPINE  SURGERY     TONSILLECTOMY     1973   Past Surgical History:  Procedure Laterality Date   ABDOMINAL HYSTERECTOMY     1986   APPENDECTOMY     1983   BREAST EXCISIONAL BIOPSY Right 1990's   BREAST SURGERY     rt breast cyst done in the 90's   CHOLECYSTECTOMY     GALLBLADDER SURGERY     1983   ROTATOR CUFF REPAIR     right   SPINE SURGERY     TONSILLECTOMY     1973   Past Medical History:  Diagnosis Date   Calcific tendonitis    Cervical spondylosis without myelopathy    Depression    Diabetes mellitus    Esophageal stricture    Fibromyalgia    GERD (gastroesophageal reflux disease)    Hiatal hernia    Hyperlipidemia    Hypertension    BP 106/66   Pulse (!) 108   Wt 139 lb (63 kg)   SpO2 97%   BMI 26.26 kg/m   Opioid Risk Score:   Fall Risk Score:  `1  Depression screen [facility] 2/9     06/07/2023   11:37 AM 04/22/2023   11:32 AM 04/22/2023   11:29 AM 02/18/2023    1:08 PM 12/29/2022    1:25 PM 12/28/2022    1:59 PM 12/03/2022    1:08 PM  Depression screen PHQ 2/9  Decreased Interest 3 3 0 0 1 2 2   Down, Depressed, Hopeless 3 2 0 0 1 2 1   PHQ - 2 Score 6 5 0 0 2 4 3   Altered sleeping  2 0 1  1 2   Tired, decreased energy  3 0 3  3 2   Change in appetite  1 0 2  2 3   Feeling bad or failure about yourself   0 0 1  1 1   Trouble concentrating  0 0 1  1 1   Moving slowly or fidgety/restless  0 0 0  0 0  Suicidal thoughts  0 0 0  0 0  PHQ-9 Score  11 0 8  12 12   Difficult doing work/chores  Very difficult Not difficult at all Somewhat difficult  Somewhat difficult Very difficult    Review of Systems  Musculoskeletal:  Positive for back pain and neck pain.       Pain in the right hip, right leg  All other systems reviewed and are negative.      Objective:   Physical Exam  General: No acute distress HEENT: NCAT, EOMI, oral membranes moist Cards: reg rate  Chest: normal  effort Abdomen: Soft, NT, ND Skin: spotty maculopapular rash on chest and both  legs Extremities: no edema Psych: pleasant and appropriate .  Her mood seemed more upbeat today Neuro:  Patient is cognitively appropriate.  Neuro cranial nerve exam.  Normal insight and awareness.  Strength is grossly 4 out of 5 in both upper extremities proximal distal.  Reflexes are 1-2+.  Sensory remains intact in all 4's. Aaron Aas  No changes in motor and sensory exam today. Musculoskeletal: Patient still with a head forward and really body forward posture today.  She has impingement sign in the left shoulder once again.  Range of motion in general is limited on the left side and she has tight trapezius muscles as well as other shoulder girdle muscles left greater than right.  On examination of her low back she is limited with range of motion really in all planes.  She had significant tightness of the right lumbar and thoracic paraspinal musculature.  She has pain to palpation over the left paraspinal muscle area specifically from S1 to 3. she is able to rotate slightly and forward flex with some discomfort but again limited due to her spasms on the right.       Assessment & Plan:     1. Fibromyalgia/myofascial pain involving the left shoulder girdle, particularly the rhomboid area. Discussed stretching and exercise at length              -B12, vitamin D  supplements. Continue thyroid  supplementation also  2. Rotator cuff syndrome bilaterally.            -had left RTC surgery was scheduled but she didn't have it ultimately d/t some issues regarding her preop antibiotics.            3. Cervicalgia. Post-laminectomy syndrome, facet arthropathy:  Cervical Radiculopathy:     Continue Gabapentin .           -MS IR 15mg  q12 prn #80.  I refilled this today.  I do not plan on increasing this in the near future.           We will continue the controlled substance monitoring program, this consists of regular clinic visits, examinations, routine drug screening, pill counts as well as use of [place]   Controlled Substance Reporting System. [place].             -? cervical MBB's of cervical spine           -C7-T1 ESI without beneift. Repeat injections with some benefit           C5-6 foraminal stenosis on MRI-ESI's without much benefit            -NS recommended surgery?. 4. Depression/sleep: This is really her biggest problem. Continue Cymbalta .   90 mg daily        -continue neuropsych for pain mgt techniques, coping skills==very helpful             -melatonin 3 to 6 mg at bedtime.               -See #1 above.  Really needs to get out of her house and pursue physical activity, sunshine, social activities as well is leisure activities. 6. Lumbar back pain-- a lot of this is muscle/myofasical  -dc tizanidine  as she cannot take it more than on a intermittent, as needed basis  -Begin robaxin  500mg  tid  -Reviewed home exercise and stretching program 7 OA of right hand:  Voltaren  gel and heat therapy.              . 8. Migraines: stable, largely driven by shoulder girdle and neck pain.             -  topamax  at 100 mg at bedtime has helped      -severe headaches only happening rarely 9. Left CTS:S/P EMG on 03/08/2017: S/P Carpal Tunnel Release on 06/14/2017 via . Dr. Ortman.  10. OIC: movantik            -continue diet/probiotic and diet   15 min of face to face patient care time were spent during this visit. All questions were encouraged and answered.  Follow up with NP in a month

## 2023-08-25 NOTE — Patient Instructions (Signed)
 ALWAYS FEEL FREE TO CALL OUR OFFICE WITH ANY PROBLEMS OR QUESTIONS [phone])  **PLEASE NOTE** ALL MEDICATION REFILL REQUESTS (INCLUDING CONTROLLED SUBSTANCES) NEED TO BE MADE AT LEAST 7 DAYS PRIOR TO REFILL BEING DUE. ANY REFILL REQUESTS INSIDE THAT TIME FRAME MAY RESULT IN DELAYS IN RECEIVING YOUR PRESCRIPTION.     !!!!!HAPPY NEW YEAR!!!!!                    STOP TIZANIDINE . USE ROBAXIN (METHOCARBAMOL ) 500 MG THREE X DAILY  STRETCHING, HEAT FOR YOUR LOW AND MID BACK.  IT'S TIGHT!!!!!!!

## 2023-09-01 ENCOUNTER — Inpatient Hospital Stay: Admission: RE | Admit: 2023-09-01 | Payer: 59 | Source: Ambulatory Visit

## 2023-09-01 ENCOUNTER — Other Ambulatory Visit: Payer: 59

## 2023-09-03 ENCOUNTER — Encounter: Payer: Self-pay | Admitting: Gastroenterology

## 2023-09-14 ENCOUNTER — Other Ambulatory Visit: Payer: Self-pay | Admitting: Physical Medicine & Rehabilitation

## 2023-09-14 ENCOUNTER — Encounter: Payer: 59 | Admitting: Gastroenterology

## 2023-09-14 DIAGNOSIS — G43019 Migraine without aura, intractable, without status migrainosus: Secondary | ICD-10-CM

## 2023-09-18 ENCOUNTER — Other Ambulatory Visit: Payer: Self-pay | Admitting: Physical Medicine & Rehabilitation

## 2023-09-21 ENCOUNTER — Encounter: Payer: 59 | Admitting: Registered Nurse

## 2023-09-21 NOTE — Progress Notes (Deleted)
 Subjective:    Patient ID: Robin Morgan, female    DOB: June 17, 1956, 68 y.o.   MRN: [id number]  HPI   Pain Inventory Average Pain {NUMBERS; 0-10:5044} Pain Right Now {NUMBERS; 0-10:5044} My pain is {PAIN DESCRIPTION:21022940}  In the last 24 hours, has pain interfered with the following? General activity {NUMBERS; 0-10:5044} Relation with others {NUMBERS; 0-10:5044} Enjoyment of life {NUMBERS; 0-10:5044} What TIME of day is your pain at its worst? {time of day:24191} Sleep (in general) {BHH GOOD/FAIR/POOR:22877}  Pain is worse with: {ACTIVITIES:21022942} Pain improves with: {PAIN IMPROVES ZHYQ:65784696} Relief from Meds: {NUMBERS; 0-10:5044}  Family History  Problem Relation Age of Onset   Alzheimer's disease Mother    Diabetes Father    Heart disease Maternal Grandmother    Breast cancer Paternal Grandmother 5   Social History   Socioeconomic History   Marital status: Divorced    Spouse name: Not on file   Number of children: 1   Years of education: Not on file   Highest education level: Not on file  Occupational History   Occupation: disabled  Tobacco Use   Smoking status: Never   Smokeless tobacco: Never  Vaping Use   Vaping status: Never Used  Substance and Sexual Activity   Alcohol use: No   Drug use: No   Sexual activity: Not Currently  Other Topics Concern   Not on file  Social History Narrative   Not on file   Social Drivers of Health   Financial Resource Strain: Low Risk  (02/18/2023)   Overall Financial Resource Strain (CARDIA)    Difficulty of Paying Living Expenses: Not hard at all  Food Insecurity: No Food Insecurity (02/18/2023)   Hunger Vital Sign    Worried About Running Out of Food in the Last Year: Never true    Ran Out of Food in the Last Year: Never true  Transportation Needs: No Transportation Needs (02/18/2023)   PRAPARE - Administrator, Civil Service (Medical): No    Lack of Transportation (Non-Medical): No   Physical Activity: Inactive (02/18/2023)   Exercise Vital Sign    Days of Exercise per Week: 0 days    Minutes of Exercise per Session: 0 min  Stress: No Stress Concern Present (02/18/2023)   [employer] of Occupational Health - Occupational Stress Questionnaire    Feeling of Stress : Only a little  Social Connections: Socially Isolated (02/18/2023)   Social Connection and Isolation Panel [NHANES]    Frequency of Communication with Friends and Family: More than three times a week    Frequency of Social Gatherings with Friends and Family: Once a week    Attends Religious Services: Never    Database administrator or Organizations: No    Attends Engineer, structural: Never    Marital Status: Divorced   Past Surgical History:  Procedure Laterality Date   ABDOMINAL HYSTERECTOMY     1986   APPENDECTOMY     1983   BREAST EXCISIONAL BIOPSY Right 1990's   BREAST SURGERY     rt breast cyst done in the 90's   CHOLECYSTECTOMY     GALLBLADDER SURGERY     1983   ROTATOR CUFF REPAIR     right   SPINE SURGERY     TONSILLECTOMY     1973   Past Surgical History:  Procedure Laterality Date   ABDOMINAL HYSTERECTOMY     1986   APPENDECTOMY     1983   BREAST  EXCISIONAL BIOPSY Right 1990's   BREAST SURGERY     rt breast cyst done in the 90's   CHOLECYSTECTOMY     GALLBLADDER SURGERY     1983   ROTATOR CUFF REPAIR     right   SPINE SURGERY     TONSILLECTOMY     1973   Past Medical History:  Diagnosis Date   Calcific tendonitis    Cervical spondylosis without myelopathy    Depression    Diabetes mellitus    Esophageal stricture    Fibromyalgia    GERD (gastroesophageal reflux disease)    Hiatal hernia    Hyperlipidemia    Hypertension    There were no vitals taken for this visit.  Opioid Risk Score:   Fall Risk Score:  `1  Depression screen [facility] 2/9     08/25/2023    3:11 PM 06/07/2023   11:37 AM 04/22/2023   11:32 AM 04/22/2023   11:29 AM 02/18/2023     1:08 PM 12/29/2022    1:25 PM 12/28/2022    1:59 PM  Depression screen PHQ 2/9  Decreased Interest 1 3 3  0 0 1 2  Down, Depressed, Hopeless 1 3 2  0 0 1 2  PHQ - 2 Score 2 6 5  0 0 2 4  Altered sleeping   2 0 1  1  Tired, decreased energy   3 0 3  3  Change in appetite   1 0 2  2  Feeling bad or failure about yourself    0 0 1  1  Trouble concentrating   0 0 1  1  Moving slowly or fidgety/restless   0 0 0  0  Suicidal thoughts   0 0 0  0  PHQ-9 Score   11 0 8  12  Difficult doing work/chores   Very difficult Not difficult at all Somewhat difficult  Somewhat difficult    Review of Systems     Objective:   Physical Exam        Assessment & Plan:

## 2023-09-22 ENCOUNTER — Ambulatory Visit
Admission: RE | Admit: 2023-09-22 | Discharge: 2023-09-22 | Disposition: A | Discharge status: Home / Self Care | Payer: 59 | Source: Ambulatory Visit | Attending: Family Medicine | Admitting: Family Medicine

## 2023-09-22 DIAGNOSIS — N644 Mastodynia: Secondary | ICD-10-CM

## 2023-09-24 ENCOUNTER — Encounter: Payer: 59 | Attending: Registered Nurse | Admitting: Registered Nurse

## 2023-09-24 VITALS — BP 110/55 | HR 104 | Ht 61.0 in | Wt 139.0 lb

## 2023-09-24 DIAGNOSIS — G8929 Other chronic pain: Secondary | ICD-10-CM | POA: Insufficient documentation

## 2023-09-24 DIAGNOSIS — G894 Chronic pain syndrome: Secondary | ICD-10-CM | POA: Insufficient documentation

## 2023-09-24 DIAGNOSIS — G43019 Migraine without aura, intractable, without status migrainosus: Secondary | ICD-10-CM | POA: Diagnosis not present

## 2023-09-24 DIAGNOSIS — M5416 Radiculopathy, lumbar region: Secondary | ICD-10-CM | POA: Insufficient documentation

## 2023-09-24 DIAGNOSIS — M5412 Radiculopathy, cervical region: Secondary | ICD-10-CM | POA: Diagnosis not present

## 2023-09-24 DIAGNOSIS — Z79891 Long term (current) use of opiate analgesic: Secondary | ICD-10-CM | POA: Insufficient documentation

## 2023-09-24 DIAGNOSIS — M5010 Cervical disc disorder with radiculopathy, unspecified cervical region: Secondary | ICD-10-CM | POA: Diagnosis not present

## 2023-09-24 DIAGNOSIS — M546 Pain in thoracic spine: Secondary | ICD-10-CM | POA: Diagnosis not present

## 2023-09-24 DIAGNOSIS — Z5181 Encounter for therapeutic drug level monitoring: Secondary | ICD-10-CM | POA: Insufficient documentation

## 2023-09-24 DIAGNOSIS — M542 Cervicalgia: Secondary | ICD-10-CM | POA: Diagnosis not present

## 2023-09-24 DIAGNOSIS — M7918 Myalgia, other site: Secondary | ICD-10-CM | POA: Diagnosis not present

## 2023-09-24 NOTE — Progress Notes (Signed)
 Subjective:    Patient ID: Robin Morgan, female    DOB: [birth year], 68 y.o.   MRN: [id number]  HPI: Robin Morgan is a 68 y.o. female who returns for follow up appointment for chronic pain and medication refill. She states her pain is located in her neck radiating into her left shoulder, mid- lower back radiating into her right lower extremity. She rates her pain 7. Her current exercise regime is walking and performing stretching exercises.  Ms. Marquina Morphine equivalent is 44.44 MME.   Last UDS was Performed on 08/09/2023, it was consistent.      Pain Inventory Average Pain 9 Pain Right Now 7 My pain is sharp and aching  In the last 24 hours, has pain interfered with the following? General activity 0 Relation with others 0 Enjoyment of life 10 What TIME of day is your pain at its worst? evening and night Sleep (in general) Poor  Pain is worse with: walking, bending, sitting, inactivity, standing, and some activites Pain improves with: rest and medication Relief from Meds: 7  Family History  Problem Relation Age of Onset   Alzheimer's disease Mother    Diabetes Father    Heart disease Maternal Grandmother    Breast cancer Paternal Grandmother 72   Social History   Socioeconomic History   Marital status: Divorced    Spouse name: Not on file   Number of children: 1   Years of education: Not on file   Highest education level: Not on file  Occupational History   Occupation: disabled  Tobacco Use   Smoking status: Never   Smokeless tobacco: Never  Vaping Use   Vaping status: Never Used  Substance and Sexual Activity   Alcohol use: No   Drug use: No   Sexual activity: Not Currently  Other Topics Concern   Not on file  Social History Narrative   Not on file   Social Drivers of Health   Financial Resource Strain: Low Risk  (02/18/2023)   Overall Financial Resource Strain (CARDIA)    Difficulty of Paying Living Expenses: Not hard at all  Food Insecurity:  No Food Insecurity (02/18/2023)   Hunger Vital Sign    Worried About Running Out of Food in the Last Year: Never true    Ran Out of Food in the Last Year: Never true  Transportation Needs: No Transportation Needs (02/18/2023)   PRAPARE - Administrator, Civil Service (Medical): No    Lack of Transportation (Non-Medical): No  Physical Activity: Inactive (02/18/2023)   Exercise Vital Sign    Days of Exercise per Week: 0 days    Minutes of Exercise per Session: 0 min  Stress: No Stress Concern Present (02/18/2023)   [employer] of Occupational Health - Occupational Stress Questionnaire    Feeling of Stress : Only a little  Social Connections: Socially Isolated (02/18/2023)   Social Connection and Isolation Panel [NHANES]    Frequency of Communication with Friends and Family: More than three times a week    Frequency of Social Gatherings with Friends and Family: Once a week    Attends Religious Services: Never    Database administrator or Organizations: No    Attends Banker Meetings: Never    Marital Status: Divorced   Past Surgical History:  Procedure Laterality Date   ABDOMINAL HYSTERECTOMY     1986   APPENDECTOMY     1983   BREAST EXCISIONAL BIOPSY Right 1990's  BREAST SURGERY     rt breast cyst done in the 90's   CHOLECYSTECTOMY     GALLBLADDER SURGERY     1983   ROTATOR CUFF REPAIR     right   SPINE SURGERY     TONSILLECTOMY     1973   Past Surgical History:  Procedure Laterality Date   ABDOMINAL HYSTERECTOMY     1986   APPENDECTOMY     1983   BREAST EXCISIONAL BIOPSY Right 1990's   BREAST SURGERY     rt breast cyst done in the 90's   CHOLECYSTECTOMY     GALLBLADDER SURGERY     1983   ROTATOR CUFF REPAIR     right   SPINE SURGERY     TONSILLECTOMY     1973   Past Medical History:  Diagnosis Date   Calcific tendonitis    Cervical spondylosis without myelopathy    Depression    Diabetes mellitus    Esophageal stricture     Fibromyalgia    GERD (gastroesophageal reflux disease)    Hiatal hernia    Hyperlipidemia    Hypertension    There were no vitals taken for this visit.  Opioid Risk Score:   Fall Risk Score:  `1  Depression screen [facility] 2/9     08/25/2023    3:11 PM 06/07/2023   11:37 AM 04/22/2023   11:32 AM 04/22/2023   11:29 AM 02/18/2023    1:08 PM 12/29/2022    1:25 PM 12/28/2022    1:59 PM  Depression screen PHQ 2/9  Decreased Interest 1 3 3  0 0 1 2  Down, Depressed, Hopeless 1 3 2  0 0 1 2  PHQ - 2 Score 2 6 5  0 0 2 4  Altered sleeping   2 0 1  1  Tired, decreased energy   3 0 3  3  Change in appetite   1 0 2  2  Feeling bad or failure about yourself    0 0 1  1  Trouble concentrating   0 0 1  1  Moving slowly or fidgety/restless   0 0 0  0  Suicidal thoughts   0 0 0  0  PHQ-9 Score   11 0 8  12  Difficult doing work/chores   Very difficult Not difficult at all Somewhat difficult  Somewhat difficult    Review of Systems  Musculoskeletal:  Positive for back pain and neck pain.       B/L shoulder pain   All other systems reviewed and are negative.     Objective:   Physical Exam Vitals and nursing note reviewed.  Constitutional:      Appearance: Normal appearance.  Cardiovascular:     Rate and Rhythm: Normal rate and regular rhythm.     Pulses: Normal pulses.     Heart sounds: Normal heart sounds.  Pulmonary:     Effort: Pulmonary effort is normal.     Breath sounds: Normal breath sounds.  Musculoskeletal:     Comments: Normal Muscle Bulk and Muscle Testing Reveals:  Upper Extremities: Full ROM and Muscle Strength 5/5 Lower Extremities: Full ROM and Muscle Strength 5/5 Arises from Chair with ease Narrow Based  Gait     Skin:    General: Skin is warm and dry.  Neurological:     Mental Status: She is alert and oriented to person, place, and time.  Psychiatric:        Mood and  Affect: Mood normal.        Behavior: Behavior normal.         Assessment & Plan:  1.  Fibromyalgia with myofascial pain: Continue home exercise program and heat therapy. Continue current medication regimen with Flexeril. 09/24/2023.  2. Rotator cuff syndrome on the right: Continue with stretching exercises and heat therapy. 09/24/2023 3. Cervicalgia. Post-laminectomy syndrome, facet arthropathy: Cervical Radiculopathy: Continue current medication regimen with Gabapentin. 09/24/2023  S/ P  EMG with Dr. Riley Kill on 03/08/2017: Diagnosed with Moderate to Severe CTS of Left wrist: 09/24/2023. 4. Depression: Continue current medication regimen with Cymbalta. PCP Following. Current  dose 90 mg daily. 09/24/2023 5. Mid/low back pain/ Lumbar Spondylosis: Continue Current Medication and stretching and heat therapy. 09/24/2023 Continue : No script given, she will call if she needs a refill, she verbalizes understanding. Morphine Sulfate IR 15 mg one tablet three times  a day as needed for pain # 80.  6. Lumbar Radiculopathy:  Continue current medication regimen with Gabapentin. 09/24/2023 7. OA of right hand: Continue Voltaren gel/ May substitute with Voltaren Tablet, she realizes she can't use both and verbalizes understanding. Continue with  heat therapy. 09/24/2023 8. Migraines: Continue with headache Journal.  Continue to Monitor  09/24/2023 9. Left CTS:S/P EMG on 03/08/2017: S/P Carpal Tunnel Release on 06/14/2017 via . Dr. Orlan Leavens. 09/24/2023 10.. Muscle Spasm: Continue Tizanidine  Continue to Monitor. 09/24/2023 11. Left Shoulder Pain:No complaints today.  Ortho Following: Her scheduled  surgery is pending at this time.  Continue HEP as Tolerated. Continue to Monitor. 09/24/2023  12. Fall at Home: No falls since last visit. Continue to Monitor. 09/24/2023      F/U in 2 months

## 2023-09-29 ENCOUNTER — Other Ambulatory Visit: Payer: Self-pay | Admitting: Family Medicine

## 2023-10-04 ENCOUNTER — Ambulatory Visit: Payer: Self-pay | Admitting: Family Medicine

## 2023-10-04 NOTE — Telephone Encounter (Signed)
 Chief Complaint: Sinus infection Symptoms: Thick green phlegm from nose and eyes Frequency: 4 days Pertinent Negatives: Patient denies fever Disposition: [] ED /[] Urgent Care (no appt availability in office) / [x] Appointment(In office/virtual)/ []  [facility]/ [] Home Care/ [] Refused Recommended Disposition /[] [facility]/ []  Follow-up with PCP Additional Notes: Patient called in reporting sinus pressure and pain in face, thick green nasal discharge and creamy green discharge from eyes. Patient requesting evaluation for potential sinus infection due to symptoms. Patient denies fever, stating she doesn't ever run a fever. Patient appt made for evaluation.   Copied from CRM [id number]. Topic: Clinical - Medical Advice >> Oct 04, 2023  4:40 PM Alcus Dad wrote: Reason for CRM: Patient stated that she thinks she has a sinus infection. Her eyes are matted with green mucus... having issues with her vision and spitting up green mucus Reason for Disposition  [1] Using nasal washes and pain medicine > 24 hours AND [2] sinus pain (around cheekbone or eye) persists  Answer Assessment - Initial Assessment Questions 1. LOCATION: "Where does it hurt?"      Headache everyday over right eye, cheekbones tender also 2. ONSET: "When did the sinus pain start?"  (e.g., hours, days)      4 days ago 3. SEVERITY: "How bad is the pain?"   (Scale 1-10; mild, moderate or severe)   - MILD (1-3): doesn't interfere with normal activities    - MODERATE (4-7): interferes with normal activities (e.g., work or school) or awakens from sleep   - SEVERE (8-10): excruciating pain and patient unable to do any normal activities        Moderate tenderness 4. RECURRENT SYMPTOM: "Have you ever had sinus problems before?" If Yes, ask: "When was the last time?" and "What happened that time?"      Yes - a while 5. NASAL CONGESTION: "Is the nose blocked?" If Yes, ask: "Can you open it or must you breathe through your  mouth?"     Yes 6. NASAL DISCHARGE: "Do you have discharge from your nose?" If so ask, "What color?"     Green discharge from neck 7. FEVER: "Do you have a fever?" If Yes, ask: "What is it, how was it measured, and when did it start?"      No 8. OTHER SYMPTOMS: "Do you have any other symptoms?" (e.g., sore throat, cough, earache, difficulty breathing)     Ear blurriness, thick creamy drainage from right eye  Protocols used: Sinus Pain or Congestion-A-AH

## 2023-10-05 ENCOUNTER — Ambulatory Visit: Payer: 59 | Admitting: Physician Assistant

## 2023-10-05 ENCOUNTER — Encounter: Payer: Self-pay | Admitting: Family Medicine

## 2023-10-06 ENCOUNTER — Encounter: Payer: Self-pay | Admitting: Family Medicine

## 2023-10-06 ENCOUNTER — Ambulatory Visit (INDEPENDENT_AMBULATORY_CARE_PROVIDER_SITE_OTHER): Payer: 59 | Admitting: Family Medicine

## 2023-10-06 VITALS — BP 90/54 | HR 107 | Temp 97.7°F | Ht 61.0 in | Wt 139.6 lb

## 2023-10-06 DIAGNOSIS — M961 Postlaminectomy syndrome, not elsewhere classified: Secondary | ICD-10-CM | POA: Diagnosis not present

## 2023-10-06 DIAGNOSIS — G894 Chronic pain syndrome: Secondary | ICD-10-CM

## 2023-10-06 DIAGNOSIS — M51362 Other intervertebral disc degeneration, lumbar region with discogenic back pain and lower extremity pain: Secondary | ICD-10-CM | POA: Diagnosis not present

## 2023-10-06 DIAGNOSIS — R490 Dysphonia: Secondary | ICD-10-CM

## 2023-10-06 DIAGNOSIS — J0110 Acute frontal sinusitis, unspecified: Secondary | ICD-10-CM

## 2023-10-06 MED ORDER — DOXYCYCLINE HYCLATE 100 MG PO TABS: 100.0000 mg | ORAL_TABLET | Freq: Two times a day (BID) | ORAL | 0 refills | Status: AC

## 2023-10-06 NOTE — Progress Notes (Signed)
 Subjective  CC:  Chief Complaint  Patient presents with   Sinus Problem    Pt stated that she has been having some sinus problem for the past week. Some SOB and headache on the rt side and some eye drainage    HPI: Robin Morgan is a 68 y.o. female who presents to the office today to address the problems listed above in the chief complaint. Discussed the use of AI scribe software for clinical note transcription with the patient, who gave verbal consent to proceed.  History of Present Illness   Robin Morgan is a 68 year old female who presents with sinus congestion and headache.  She has been experiencing significant nasal congestion with green discharge for about a week, which has been worsening daily. She denies sore throat or fever but has a persistent headache. Occasionally, she experiences coughing and frequently feels out of breath. She also notes feeling clammy and lightheaded at times. No body aches or COVID-like symptoms are present.  She has a history of low blood pressure, which is atypical for her as she usually has normal readings. She is currently taking lisinopril 5mg  daily. There is consideration to adjust the dosage due to her low blood pressure readings.  She describes episodes where her voice suddenly stops during conversations, accompanied by a sensation of pressure in her throat. This has been occurring for some time, even before her current sinus issues. She denies feeling hoarse but struggles to produce sound during these episodes.  Following cataract surgery, she reports changes in her vision, particularly difficulty with near vision despite corrective glasses. She mentions that her eyes have green drainage, which she associates with her sinus issues.  She has a history of chronic back pain and mentions a previous recommendation for an MRI, which was not followed through. She feels that her activity level is not optimal.       Assessment  1. Acute  non-recurrent frontal sinusitis   2. Cervical post-laminectomy syndrome   3. Chronic pain associated with significant psychosocial dysfunction   4. Dysphonia   5. Degeneration of intervertebral disc of lumbar region with discogenic back pain and lower extremity pain      Plan  Assessment and Plan    Sinusitis Symptoms of green nasal discharge, headache, and pressure in the face for one week. No fever or sore throat. -Start doxycycline bid for 10 days. -supportive care -denies fevers, sxs of covid or sorethroat  Hypertension w/ low bp today and last visit   -Reduce Lisinopril dose by half. Start 2.5mg  daily and monitor.   Dysphonia Patient reports intermittent inability to produce sound while speaking, unrelated to respiratory issues. -Refer to an Ear, Nose, and Throat (ENT) specialist for evaluation of vocal cords.  Chronic pain: per pain mgt. Counseling done.  Follow-up in March 2025 as scheduled for diabetes and recheck     Meds ordered this encounter  Medications   doxycycline (VIBRA-TABS) 100 MG tablet    Sig: Take 1 tablet (100 mg total) by mouth 2 (two) times daily for 10 days.    Dispense:  20 tablet    Refill:  0     I reviewed the patients updated PMH, FH, and SocHx.    Patient Active Problem List   Diagnosis Date Noted   Insomnia due to medical condition 12/01/2019    Priority: High   Chronic prescription opiate use 09/02/2018    Priority: High   Diabetic peripheral neuropathy associated with type  2 diabetes mellitus (HCC) 01/06/2017    Priority: High   OSA (obstructive sleep apnea) 03/10/2016    Priority: High   Controlled diabetes mellitus with diabetic neuropathy, with long-term current use of insulin (HCC) 10/11/2014    Priority: High   Hypertension associated with diabetes (HCC) 10/11/2014    Priority: High   Combined hyperlipidemia associated with type 2 diabetes mellitus (HCC) 10/11/2014    Priority: High   DDD (degenerative disc disease),  lumbar 01/15/2014    Priority: High   Hypothyroidism, acquired 06/12/2013    Priority: High   Cervical post-laminectomy syndrome 05/10/2013    Priority: High   Depression, recurrent (HCC) 10/28/2011    Priority: High   Chronic pain associated with significant psychosocial dysfunction 01/03/2010    Priority: High   Migraine without aura, intractable, without status migrainosus 07/31/2020    Priority: Medium    Esophageal stricture     Priority: Medium    Tenosynovitis, wrist 10/07/2017    Priority: Medium    Trigger thumb of left hand 10/07/2017    Priority: Medium    Laryngopharyngeal reflux (LPR) 08/26/2017    Priority: Medium    Dysphagia 08/26/2017    Priority: Medium    Left carpal tunnel syndrome 03/08/2017    Priority: Medium    Fibromyalgia 02/12/2016    Priority: Medium    Osteoporosis 12/01/2012    Priority: Medium    Chronic diarrhea 07/04/2012    Priority: Medium    Vitamin D deficiency 09/28/2022    Priority: Low   Vitamin B12 deficiency 09/28/2022    Priority: Low   Traumatic tear of left rotator cuff 09/28/2022   Urinary urgency 09/28/2022   Rotator cuff syndrome, left 12/11/2020   Cervical disc disorder with radiculopathy of cervical region 07/31/2020   Biceps tendinitis, right 02/07/2020   Mid back pain 12/06/2019   Myofascial muscle pain 10/28/2011   Current Meds  Medication Sig   atorvastatin (LIPITOR) 40 MG tablet TAKE ONE TABLET BY MOUTH ONCE A DAY   Calcium Carbonate-Vitamin D (CALCIUM-VITAMIN D) 500-200 MG-UNIT per tablet Take 1 tablet by mouth 2 (two) times daily with a meal.   carboxymethylcellul-glycerin (OPTIVE) 0.5-0.9 % ophthalmic solution Apply to eye.   Cholecalciferol (VITAMIN D3) 50 MCG (2000 UT) CAPS Take 2 capsules by mouth daily.   co-enzyme Q-10 30 MG capsule Take 30 mg by mouth at bedtime.   dapagliflozin propanediol (FARXIGA) 10 MG TABS tablet TAKE ONE TABLET BY MOUTH ONCE A DAY (NEED OFFICE VISIT FOR REFILLS)   diclofenac Sodium  (VOLTAREN) 1 % GEL    doxycycline (VIBRA-TABS) 100 MG tablet Take 1 tablet (100 mg total) by mouth 2 (two) times daily for 10 days.   DULoxetine (CYMBALTA) 30 MG capsule TAKE THREE CAPSULES BY MOUTH DAILY   esomeprazole (NEXIUM) 40 MG capsule TAKE ONE CAPSULE BY MOUTH TWO TIMES DAILY BEFORE A MEAL MORNING AND EVENING   fenofibrate 160 MG tablet TAKE ONE TABLET BY MOUTH ONCE A DAY   gabapentin (NEURONTIN) 300 MG capsule TAKE TWO CAPSULES BY MOUTH IN THE MORNING, TWO CAPSULES AT LUNCH AND TWO CAPSULES AT BEDTIME.   insulin degludec (TRESIBA FLEXTOUCH) 200 UNIT/ML FlexTouch Pen Inject 30 Units into the skin 2 (two) times daily.   levothyroxine (SYNTHROID) 50 MCG tablet 1 tablet Orally Once a day   levothyroxine (SYNTHROID) 88 MCG tablet    Magnesium Gluconate (MAGNESIUM 27 PO) Take by mouth.   magnesium gluconate (MAGONATE) 500 MG tablet Take 500 mg by mouth  2 (two) times daily.   metFORMIN (GLUCOPHAGE) 1000 MG tablet TAKE 1 TABLET BY MOUTH TWICE A DAY WITH MEALS   morphine (MSIR) 15 MG tablet TAKE ONE TABLET BY MOUTH THREE TIMES DAILY AS NEEDED FOR MODERATE PAIN.   naloxegol oxalate (MOVANTIK) 12.5 MG TABS tablet Take 1 tablet (12.5 mg total) by mouth daily.   naloxegol oxalate (MOVANTIK) 25 MG TABS tablet Take by mouth.   Omega-3 Fatty Acids (FISH OIL) 1000 MG CAPS    RESTASIS 0.05 % ophthalmic emulsion Place 1 drop into both eyes daily.   SUPER B COMPLEX/C PO Take by mouth.   topiramate (TOPAMAX) 100 MG tablet TAKE ONE TABLET BY MOUTH EVERY NIGHT AT BEDTIME   vitamin B-12 (CYANOCOBALAMIN) 1000 MCG tablet Take 1,000 mcg by mouth daily.   zolpidem (AMBIEN) 10 MG tablet TAKE ONE TABLET BY MOUTH EVERY NIGHT AT BEDTIME AS NEEDED FOR SLEEP   [DISCONTINUED] lisinopril (ZESTRIL) 5 MG tablet TAKE ONE TABLETS BY MOUTH DAILY    Allergies: Patient is allergic to compazine, prochlorperazine, amoxicillin, emetrol, other, prochlorperazine edisylate, prochlorperazine maleate, and naproxen. Family  History: Patient family history includes Alzheimer's disease in her mother; Breast cancer (age of onset: 26) in her paternal grandmother; Diabetes in her father; Heart disease in her maternal grandmother. Social History:  Patient  reports that she has never smoked. She has never used smokeless tobacco. She reports that she does not drink alcohol and does not use drugs.  Review of Systems: Constitutional: Negative for fever malaise or anorexia Cardiovascular: negative for chest pain Respiratory: negative for SOB or persistent cough Gastrointestinal: negative for abdominal pain  Objective  Vitals: BP (!) 90/54   Pulse (!) 107   Temp 97.7 F (36.5 C)   Ht 5\' 1"  (1.549 m)   Wt 139 lb 9.6 oz (63.3 kg)   SpO2 95%   BMI 26.38 kg/m  General: no acute distress , A&Ox3, no respiratory distress HEENT: PEERL, conjunctiva normal, neck is supple, + right maxillary and frontal sinus ttp, no cervical LAD or neck mass Cardiovascular:  RRR without murmur or gallop.  Respiratory:  Good breath sounds bilaterally, CTAB with normal respiratory effort Skin:  clammy, no rashes  Commons side effects, risks, benefits, and alternatives for medications and treatment plan prescribed today were discussed, and the patient expressed understanding of the given instructions. Patient is instructed to call or message via MyChart if he/she has any questions or concerns regarding our treatment plan. No barriers to understanding were identified. We discussed Red Flag symptoms and signs in detail. Patient expressed understanding regarding what to do in case of urgent or emergency type symptoms.  Medication list was reconciled, printed and provided to the patient in AVS. Patient instructions and summary information was reviewed with the patient as documented in the AVS. This note was prepared with assistance of Dragon voice recognition software. Occasional wrong-word or sound-a-like substitutions may have occurred due to the  inherent limitations of voice recognition software

## 2023-10-06 NOTE — Patient Instructions (Addendum)
 Please follow up as scheduled for your next visit with me: 10/20/2023   If you have any questions or concerns, please don't hesitate to send me a message via MyChart or call the office at [phone]. Thank you for visiting with [place] today! It's our pleasure caring for you.   VISIT SUMMARY:  Today, we discussed your sinus congestion and headache, low blood pressure, voice issues, and vision changes following cataract surgery. We have made some adjustments to your medication and provided referrals for further evaluation.  YOUR PLAN:  -SINUSITIS: Sinusitis is an inflammation of the sinuses that can cause symptoms like nasal congestion, green discharge, headache, and facial pressure. We will start you on an antibiotic, Doxycycline, to help clear the infection.  -HYPOTENSION: Hypotension means low blood pressure, which can cause symptoms like lightheadedness and feeling clammy. We will reduce your Lisinopril dose from 5mg  to 2.5mg  daily to help manage your blood pressure.  -DYSPHONIA: Dysphonia is a condition where you have difficulty producing sound. Please schedule a visit with your Ear, Nose, and Throat (ENT) specialist to evaluate your vocal cords and determine the cause.  INSTRUCTIONS:  Please follow up in March 2025 for a re-evaluation. If you experience any worsening symptoms or new issues, contact our office immediately.

## 2023-10-12 LAB — HM DIABETES EYE EXAM

## 2023-10-20 ENCOUNTER — Ambulatory Visit: Payer: 59 | Admitting: Family Medicine

## 2023-10-21 ENCOUNTER — Other Ambulatory Visit: Payer: Self-pay | Admitting: Family Medicine

## 2023-10-21 MED ORDER — DAPAGLIFLOZIN PROPANEDIOL 10 MG PO TABS: 10.0000 mg | ORAL_TABLET | Freq: Every day | ORAL | 0 refills | Status: DC

## 2023-10-21 NOTE — Telephone Encounter (Signed)
 Copied from CRM [id number]. Topic: Clinical - Medication Refill >> Oct 21, 2023 11:03 AM Isabell A wrote: Most Recent Primary Care Visit:  Provider: Willow Ora  Department: [facility]  Visit Type: ACUTE  Date: 10/06/2023  Medication: metFORMIN (GLUCOPHAGE) 1000 MG tablet  dapagliflozin propanediol (FARXIGA) 10 MG TABS tablet  Has the patient contacted their pharmacy? Yes (Agent: If no, request that the patient contact the pharmacy for the refill. If patient does not wish to contact the pharmacy document the reason why and proceed with request.) (Agent: If yes, when and what did the pharmacy advise?)  Is this the correct pharmacy for this prescription? Yes If no, delete pharmacy and type the correct one.  This is the patient's preferred pharmacy:  [facility] - [place], [place] - [address] [place] [address] Phone: [phone] Fax: [phone]   Has the prescription been filled recently? Yes  Is the patient out of the medication? Yes  Has the patient been seen for an appointment in the last year OR does the patient have an upcoming appointment? Yes  Can we respond through MyChart? Yes  Agent: Please be advised that Rx refills may take up to 3 business days. We ask that you follow-up with your pharmacy.

## 2023-10-25 ENCOUNTER — Ambulatory Visit (INDEPENDENT_AMBULATORY_CARE_PROVIDER_SITE_OTHER): Admitting: Family Medicine

## 2023-10-25 ENCOUNTER — Encounter: Payer: Self-pay | Admitting: Family Medicine

## 2023-10-25 ENCOUNTER — Telehealth: Payer: Self-pay

## 2023-10-25 VITALS — BP 112/64 | HR 90 | Temp 97.8°F | Ht 61.0 in | Wt 138.4 lb

## 2023-10-25 DIAGNOSIS — E782 Mixed hyperlipidemia: Secondary | ICD-10-CM | POA: Diagnosis not present

## 2023-10-25 DIAGNOSIS — Z0000 Encounter for general adult medical examination without abnormal findings: Secondary | ICD-10-CM | POA: Diagnosis not present

## 2023-10-25 DIAGNOSIS — E1159 Type 2 diabetes mellitus with other circulatory complications: Secondary | ICD-10-CM | POA: Diagnosis not present

## 2023-10-25 DIAGNOSIS — E1169 Type 2 diabetes mellitus with other specified complication: Secondary | ICD-10-CM | POA: Diagnosis not present

## 2023-10-25 DIAGNOSIS — E1140 Type 2 diabetes mellitus with diabetic neuropathy, unspecified: Secondary | ICD-10-CM

## 2023-10-25 DIAGNOSIS — E039 Hypothyroidism, unspecified: Secondary | ICD-10-CM

## 2023-10-25 DIAGNOSIS — E119 Type 2 diabetes mellitus without complications: Secondary | ICD-10-CM

## 2023-10-25 DIAGNOSIS — M810 Age-related osteoporosis without current pathological fracture: Secondary | ICD-10-CM | POA: Diagnosis not present

## 2023-10-25 DIAGNOSIS — Z794 Long term (current) use of insulin: Secondary | ICD-10-CM | POA: Diagnosis not present

## 2023-10-25 DIAGNOSIS — I152 Hypertension secondary to endocrine disorders: Secondary | ICD-10-CM | POA: Diagnosis not present

## 2023-10-25 DIAGNOSIS — E559 Vitamin D deficiency, unspecified: Secondary | ICD-10-CM | POA: Diagnosis not present

## 2023-10-25 DIAGNOSIS — F339 Major depressive disorder, recurrent, unspecified: Secondary | ICD-10-CM | POA: Diagnosis not present

## 2023-10-25 DIAGNOSIS — R8290 Unspecified abnormal findings in urine: Secondary | ICD-10-CM

## 2023-10-25 DIAGNOSIS — E538 Deficiency of other specified B group vitamins: Secondary | ICD-10-CM | POA: Diagnosis not present

## 2023-10-25 DIAGNOSIS — Z0001 Encounter for general adult medical examination with abnormal findings: Secondary | ICD-10-CM

## 2023-10-25 LAB — POCT GLYCOSYLATED HEMOGLOBIN (HGB A1C): Hemoglobin A1C: 6.6 % — AB (ref 4.0–5.6)

## 2023-10-25 MED ORDER — DENOSUMAB 60 MG/ML ~~LOC~~ SOSY
60.0000 mg | PREFILLED_SYRINGE | Freq: Once | SUBCUTANEOUS | Status: AC
  Administered 2024-04-13: 60 mg via SUBCUTANEOUS

## 2023-10-25 NOTE — Telephone Encounter (Signed)
 Please start Prolia PA for pt.   Thank You!

## 2023-10-25 NOTE — Patient Instructions (Signed)
Please return in 6 months to recheck diabetes and blood pressure.   I will release your lab results to you on your MyChart account with further instructions. You may see the results before I do, but when I review them I will send you a message with my report or have my assistant call you if things need to be discussed. Please reply to my message with any questions. Thank you!   If you have any questions or concerns, please don't hesitate to send me a message via MyChart or call the office at [phone]. Thank you for visiting with [place] today! It's our pleasure caring for you.

## 2023-10-25 NOTE — Progress Notes (Signed)
 Subjective  Chief Complaint  Patient presents with   Follow-up    Pt here for 6 month f.u and would like urine test. Pt states that she has a strong odor in urine with urinary pain.    Depression   Diabetes   Hypertension   Hyperlipidemia    HPI: Robin Morgan is a 68 y.o. female who presents to [facility] [place] today for a Female Wellness Visit. She also has the concerns and/or needs as listed above in the chief complaint. These will be addressed in addition to the Health Maintenance Visit.   Wellness Visit: annual visit with health maintenance review and exam  Health maintenance: Mammogram current.  Bone density has been ordered and needs to be scheduled.  Colorectal cancer screens are current.  Next colonoscopy due in 2027.  Patient declines flu shot.  Needs diabetic eye exam. Chronic disease f/u and/or acute problem visit: (deemed necessary to be done in addition to the wellness visit): Type 2 diabetes is controlled on Farxiga 10, metformin 1000 twice daily and Tresiba.  Complains of sweet smelling urine no dysuria or irritative symptoms.  No symptoms of hypoglycemia.  No peripheral neuropathy symptoms.  No known retinopathy but overdue for eye exam.  Weight is stable. Hypertension hyperlipidemia due for recheck.  Nonfasting today.  Blood pressures have been well-controlled.  No edema.  No chest pain Chronic depression and fibromyalgia on multiple medications.  Reviewed and stable. Chronic narcotic use per pain management Hypothyroidism on levothyroxine 88 mcg daily verified by pharmacy.  Feels well.  Energy levels are good although she tends to be tired most of the time.  Could be pain related.  Compliant with medications. History of B12 and D deficiencies on oral supplements intermittently.  Due for recheck Osteoporosis: Reviewed bone density from 2022.  She never got started on treatment and is due for repeat bone density.  No fractures.  She is open to  starting medications at this time.  Assessment  1. Encounter for well adult exam with abnormal findings   2. Controlled type 2 diabetes mellitus with diabetic neuropathy, with long-term current use of insulin (HCC)   3. Combined hyperlipidemia associated with type 2 diabetes mellitus (HCC)   4. Depression, recurrent (HCC)   5. Hypertension associated with diabetes (HCC)   6. Hypothyroidism, acquired   7. Vitamin B12 deficiency   8. Vitamin D deficiency   9. Insulin-requiring or dependent type II diabetes mellitus (HCC)   10. Abnormal urine odor   11. Age-related osteoporosis without current pathological fracture      Plan  Female Wellness Visit: Age appropriate Health Maintenance and Prevention measures were discussed with patient. Included topics are cancer screening recommendations, ways to keep healthy (see AVS) including dietary and exercise recommendations, regular eye and dental care, use of seat belts, and avoidance of moderate alcohol use and tobacco use.  Current BMI: discussed patient's BMI and encouraged positive lifestyle modifications to help get to or maintain a target BMI. HM needs and immunizations were addressed and ordered. See below for orders. See HM and immunization section for updates. Routine labs and screening tests ordered including cmp, cbc and lipids where appropriate. Discussed recommendations regarding Vit D and calcium supplementation (see AVS)  Chronic disease management visit and/or acute problem visit: Hypertension, hyperlipidemia, hypothyroidism and diabetes: Clinically well-controlled.  Will check lab work.  No changes in medications made today.  Rec eye exam.  Urine nephropathy screening.  She is on [place]  and ACE inhibitor.  No peripheral neuropathy symptoms.  Immunizations are current.  Taking atorvastatin 40 mg nightly and tolerating well.  Monitor LFTs. Check vitamin levels, B12 and D. Check urine, recommend increasing water intake.  No clinical  symptoms of infection at this time. Chronic pain and fibromyalgia per pain management.  Reassured.  Continue mood medications.  Continue keeping active.  Counseling done Follow up: 6 months for diabetes and blood pressure recheck Orders Placed This Encounter  Procedures   Urine Culture   TSH   VITAMIN D 25 Hydroxy (Vit-D Deficiency, Fractures)   CBC with Differential/Platelet   Comprehensive metabolic panel   Lipid panel   Vitamin B12   Microalbumin / creatinine urine ratio   Urinalysis, Routine w reflex microscopic   POCT HgB A1C   No orders of the defined types were placed in this encounter.     Body mass index is 26.15 kg/m. Wt Readings from Last 3 Encounters:  10/25/23 138 lb 6.4 oz (62.8 kg)  10/06/23 139 lb 9.6 oz (63.3 kg)  09/24/23 139 lb (63 kg)     Patient Active Problem List   Diagnosis Date Noted Date Diagnosed   Insomnia due to medical condition 12/01/2019     Priority: High   Chronic prescription opiate use 09/02/2018     Priority: High   Diabetic peripheral neuropathy associated with type 2 diabetes mellitus (HCC) 01/06/2017     Priority: High   OSA (obstructive sleep apnea) 03/10/2016     Priority: High   Controlled diabetes mellitus with diabetic neuropathy, with long-term current use of insulin (HCC) 10/11/2014     Priority: High   Hypertension associated with diabetes (HCC) 10/11/2014     Priority: High   Combined hyperlipidemia associated with type 2 diabetes mellitus (HCC) 10/11/2014     Priority: High   DDD (degenerative disc disease), lumbar 01/15/2014     Priority: High   Hypothyroidism, acquired 06/12/2013     Priority: High   Cervical post-laminectomy syndrome 05/10/2013     Priority: High   Depression, recurrent (HCC) 10/28/2011     Priority: High   Chronic pain associated with significant psychosocial dysfunction 01/03/2010     Priority: High    Chronic Pain Syndrome, Dr. Guadalupe Dawn     Migraine without aura, intractable, without  status migrainosus 07/31/2020     Priority: Medium    Esophageal stricture      Priority: Medium    Tenosynovitis, wrist 10/07/2017     Priority: Medium    Trigger thumb of left hand 10/07/2017     Priority: Medium    Laryngopharyngeal reflux (LPR) 08/26/2017     Priority: Medium    Dysphagia 08/26/2017     Priority: Medium    Left carpal tunnel syndrome 03/08/2017     Priority: Medium    Fibromyalgia 02/12/2016     Priority: Medium    Osteoporosis 12/01/2012     Priority: Medium     01/2021: osteoporosis T= -2.7 wrist. Spine excluded. Offer tx.  10/2017: osteopenia: T = -1.9; stable. Recheck 2 years 4/ 2014 T = -1.2 at hip, -1.8 at spine    Chronic diarrhea 07/04/2012     Priority: Medium     Overview:  ? Related to gallbladder surgery. Has been evaluated by Dr. Madilyn Fireman GI. Lomotil as needed.    Vitamin D deficiency 09/28/2022     Priority: Low   Vitamin B12 deficiency 09/28/2022     Priority: Low   Traumatic tear  of left rotator cuff 09/28/2022    Urinary urgency 09/28/2022    Rotator cuff syndrome, left 12/11/2020    Cervical disc disorder with radiculopathy of cervical region 07/31/2020    Biceps tendinitis, right 02/07/2020    Mid back pain 12/06/2019    Myofascial muscle pain 10/28/2011    Health Maintenance  Topic Date Due   OPHTHALMOLOGY EXAM  04/22/2022   DEXA SCAN  01/16/2023   COVID-19 Vaccine (4 - 2024-25 season) 04/11/2023   Diabetic kidney evaluation - Urine ACR  09/29/2023   INFLUENZA VACCINE  11/08/2023 (Originally 03/11/2023)   Medicare Annual Wellness (AWV)  02/18/2024   FOOT EXAM  04/21/2024   HEMOGLOBIN A1C  04/26/2024   Diabetic kidney evaluation - eGFR measurement  07/20/2024   MAMMOGRAM  09/21/2024   Colonoscopy  11/19/2025   DTaP/Tdap/Td (3 - Td or Tdap) 09/28/2032   Pneumonia Vaccine 29+ Years old  Completed   Hepatitis C Screening  Completed   HPV VACCINES  Aged Out   Zoster Vaccines- Shingrix  Discontinued   Immunization History   Administered Date(s) Administered   Fluad Quad(high Dose 65+) 04/06/2019, 05/09/2020, 09/01/2021, 09/28/2022   Influenza Split 06/04/2009, 05/02/2012   Influenza, High Dose Seasonal PF 05/13/2020   Influenza, Quadrivalent, Recombinant, Inj, Pf 05/12/2016, 07/07/2017   Influenza, Seasonal, Injecte, Preservative Fre 06/06/2014, 04/24/2015   Influenza,inj,Quad PF,6+ Mos 05/12/2016, 07/07/2017, 05/05/2018   Influenza-Unspecified 09/03/2011, 04/19/2013, 05/05/2018   PFIZER(Purple Top)SARS-COV-2 Vaccination 10/03/2019, 10/24/2019, 06/28/2020   Pneumococcal Conjugate,unspecified 12/01/2019   Pneumococcal Conjugate-13 12/01/2019   Pneumococcal Polysaccharide-23 08/29/2009, 10/19/2018   Tdap 05/11/2012, 09/28/2022   We updated and reviewed the patient's past history in detail and it is documented below. Allergies: Patient is allergic to compazine, prochlorperazine, amoxicillin, emetrol, other, prochlorperazine edisylate, prochlorperazine maleate, and naproxen. Past Medical History Patient  has a past medical history of Calcific tendonitis, Cervical spondylosis without myelopathy, Depression, Diabetes mellitus, Esophageal stricture, Fibromyalgia, GERD (gastroesophageal reflux disease), Hiatal hernia, Hyperlipidemia, and Hypertension. Past Surgical History Patient  has a past surgical history that includes Spine surgery; Cholecystectomy; Tonsillectomy; Rotator cuff repair; Breast excisional biopsy (Right, 1990's); Gallbladder surgery; Breast surgery; Appendectomy; and Abdominal hysterectomy. Family History: Patient family history includes Alzheimer's disease in her mother; Breast cancer (age of onset: 10) in her paternal grandmother; Diabetes in her father; Heart disease in her maternal grandmother. Social History:  Patient  reports that she has never smoked. She has never used smokeless tobacco. She reports that she does not drink alcohol and does not use drugs.  Review of Systems: Constitutional:  negative for fever or malaise Ophthalmic: negative for photophobia, double vision or loss of vision Cardiovascular: negative for chest pain, dyspnea on exertion, or new LE swelling Respiratory: negative for SOB or persistent cough Gastrointestinal: negative for abdominal pain, change in bowel habits or melena Genitourinary: negative for dysuria or gross hematuria, no abnormal uterine bleeding or disharge Musculoskeletal: negative for new gait disturbance or muscular weakness Integumentary: negative for new or persistent rashes, no breast lumps Neurological: negative for TIA or stroke symptoms Psychiatric: negative for SI or delusions Allergic/Immunologic: negative for hives  Patient Care Team    Relationship Specialty Notifications Start End  Willow Ora, MD PCP - General Family Medicine  11/09/17   Bradly Bienenstock, MD Consulting Physician Orthopedic Surgery  11/09/17   Ranelle Oyster, MD Consulting Physician Physical Medicine and Rehabilitation  11/09/17   Aquilla Hacker, PA-C Physician Assistant Otolaryngology  11/09/17   Dohmeier, Porfirio Mylar, MD Consulting Physician Neurology  11/09/17  Willis Modena, MD Consulting Physician Gastroenterology  11/23/18   Dimitri Ped, MD Consulting Physician Ophthalmology  12/01/19   Erroll Luna, [facility] (Inactive) Pharmacist Pharmacist  05/26/21    Comment: [phone]  Bjorn Pippin, MD Consulting Physician Orthopedic Surgery  09/28/22     Objective  Vitals: BP 112/64   Pulse 90   Temp 97.8 F (36.6 C)   Ht 5\' 1"  (1.549 m)   Wt 138 lb 6.4 oz (62.8 kg)   SpO2 96%   BMI 26.15 kg/m  General:  Well developed, well nourished, no acute distress  Psych:  Alert and orientedx3,normal mood and affect HEENT:  Normocephalic, atraumatic, non-icteric sclera,  supple neck without adenopathy, mass or thyromegaly Cardiovascular:  Normal S1, S2, RRR without gallop, rub or murmur Respiratory:  Good breath sounds bilaterally, CTAB with normal  respiratory effort Gastrointestinal: normal bowel sounds, soft, non-tender, no noted masses. No HSM MSK: extremities without edema, joints without erythema or swelling Neurologic:    Mental status is normal.  Gross motor and sensory exams are normal.  No tremor Diabetic Foot Exam: Appearance - no lesions, ulcers or significant calluses Skin - no sigificant pallor or erythema Normal sensation Pulses - +2 distally bilaterally  Commons side effects, risks, benefits, and alternatives for medications and treatment plan prescribed today were discussed, and the patient expressed understanding of the given instructions. Patient is instructed to call or message via MyChart if he/she has any questions or concerns regarding our treatment plan. No barriers to understanding were identified. We discussed Red Flag symptoms and signs in detail. Patient expressed understanding regarding what to do in case of urgent or emergency type symptoms.  Medication list was reconciled, printed and provided to the patient in AVS. Patient instructions and summary information was reviewed with the patient as documented in the AVS. This note was prepared with assistance of Dragon voice recognition software. Occasional wrong-word or sound-a-like substitutions may have occurred due to the inherent limitations of voice recognition software

## 2023-10-25 NOTE — Addendum Note (Signed)
 Addended by: Samara Deist on: 10/25/2023 03:29 PM   Modules accepted: Orders

## 2023-10-26 ENCOUNTER — Telehealth: Payer: Self-pay

## 2023-10-26 LAB — URINALYSIS, ROUTINE W REFLEX MICROSCOPIC
Bilirubin Urine: NEGATIVE
Hgb urine dipstick: NEGATIVE
Leukocytes,Ua: NEGATIVE
Nitrite: NEGATIVE
Specific Gravity, Urine: >=1.03 — AB (ref 1.000–1.030)
Total Protein, Urine: NEGATIVE
Urine Glucose: 500 — AB
Urobilinogen, UA: 0.2 (ref 0.0–1.0)
pH: 5.5 (ref 5.0–8.0)

## 2023-10-26 LAB — URINE CULTURE
MICRO NUMBER:: 16209636
Result:: NO GROWTH
SPECIMEN QUALITY:: ADEQUATE

## 2023-10-26 LAB — COMPREHENSIVE METABOLIC PANEL
ALT: 13 U/L (ref 0–35)
AST: 23 U/L (ref 0–37)
Albumin: 4.4 g/dL (ref 3.5–5.2)
Alkaline Phosphatase: 53 U/L (ref 39–117)
BUN: 16 mg/dL (ref 6–23)
CO2: 21 meq/L (ref 19–32)
Calcium: 9.5 mg/dL (ref 8.4–10.5)
Chloride: 109 meq/L (ref 96–112)
Creatinine, Ser: 1.08 mg/dL (ref 0.40–1.20)
GFR: 52.18 mL/min — ABNORMAL LOW (ref >60.00)
Glucose, Bld: 175 mg/dL — ABNORMAL HIGH (ref 70–99)
Potassium: 4.4 meq/L (ref 3.5–5.1)
Sodium: 141 meq/L (ref 135–145)
Total Bilirubin: 0.3 mg/dL (ref 0.2–1.2)
Total Protein: 6.7 g/dL (ref 6.0–8.3)

## 2023-10-26 LAB — MICROALBUMIN / CREATININE URINE RATIO
Creatinine,U: 126 mg/dL
Microalb Creat Ratio: 22.9 mg/g (ref 0.0–30.0)
Microalb, Ur: 2.9 mg/dL — ABNORMAL HIGH (ref 0.0–1.9)

## 2023-10-26 LAB — LIPID PANEL
Cholesterol: 124 mg/dL (ref 0–200)
HDL: 39.4 mg/dL (ref >39.00)
LDL Cholesterol: 52 mg/dL (ref 0–99)
NonHDL: 84.74
Total CHOL/HDL Ratio: 3
Triglycerides: 163 mg/dL — ABNORMAL HIGH (ref 0.0–149.0)
VLDL: 32.6 mg/dL (ref 0.0–40.0)

## 2023-10-26 LAB — CBC WITH DIFFERENTIAL/PLATELET
Basophils Absolute: 0 10*3/uL (ref 0.0–0.1)
Basophils Relative: 0.2 % (ref 0.0–3.0)
Eosinophils Absolute: 0.2 10*3/uL (ref 0.0–0.7)
Eosinophils Relative: 2.3 % (ref 0.0–5.0)
HCT: 40.7 % (ref 36.0–46.0)
Hemoglobin: 12.8 g/dL (ref 12.0–15.0)
Lymphocytes Relative: 33.7 % (ref 12.0–46.0)
Lymphs Abs: 3.1 10*3/uL (ref 0.7–4.0)
MCHC: 31.5 g/dL (ref 30.0–36.0)
MCV: 87.7 fl (ref 78.0–100.0)
Monocytes Absolute: 0.7 10*3/uL (ref 0.1–1.0)
Monocytes Relative: 7.4 % (ref 3.0–12.0)
Neutro Abs: 5.1 10*3/uL (ref 1.4–7.7)
Neutrophils Relative %: 56.4 % (ref 43.0–77.0)
Platelets: 368 10*3/uL (ref 150.0–400.0)
RBC: 4.64 Mil/uL (ref 3.87–5.11)
RDW: 14.9 % (ref 11.5–15.5)
WBC: 9.1 10*3/uL (ref 4.0–10.5)

## 2023-10-26 LAB — VITAMIN B12: Vitamin B-12: 142 pg/mL — ABNORMAL LOW (ref 211–911)

## 2023-10-26 LAB — VITAMIN D 25 HYDROXY (VIT D DEFICIENCY, FRACTURES): VITD: 24.26 ng/mL — ABNORMAL LOW (ref 30.00–100.00)

## 2023-10-26 LAB — TSH: TSH: 4.39 u[IU]/mL (ref 0.35–5.50)

## 2023-10-26 NOTE — Telephone Encounter (Signed)
 Prolia VOB initiated via [email or web address]

## 2023-10-28 ENCOUNTER — Other Ambulatory Visit (HOSPITAL_COMMUNITY): Payer: Self-pay

## 2023-10-28 NOTE — Telephone Encounter (Signed)
 Insurance requires failure of oral and IV bisphosphates. Patient can get Prolia filled through pharmacy and sent to the office.    Pharmacy benefit: Copay $0 (Paid to pharmacy) Admin Fee: 0% (Pay at clinic)  Prior Auth: [name] PA# Expiration Date:   # of doses approved:   If patient wants fill through the pharmacy benefit please send prescription to: [email or web address], and include estimated need by date in rx notes. Pharmacy will ship medication directly to the office.  Patient NOT eligible for Prolia Copay Card. Copay Card can make patient's cost as little as $25. Link to apply: [email or web address]://[email or web address]  ** This summary of benefits is an estimation of the patient's out-of-pocket cost. Exact cost may very based on individual plan coverage.

## 2023-10-28 NOTE — Telephone Encounter (Signed)
 Robin Morgan

## 2023-10-28 NOTE — Telephone Encounter (Signed)
 Pharmacy Patient Advocate Encounter   Received notification from  [employer] Portal that prior authorization for Prolia is required/requested.   Insurance verification completed.   The patient is insured through  [employer]  .   Per test claim: PA required; PA submitted to above mentioned insurance via [facility] Portal Key/confirmation #/EOC [id number] Status is pending

## 2023-10-29 ENCOUNTER — Other Ambulatory Visit: Payer: Self-pay

## 2023-10-30 MED ORDER — MORPHINE SULFATE 15 MG PO TABS: ORAL_TABLET | ORAL | 0 refills | Status: DC

## 2023-11-08 ENCOUNTER — Encounter: Payer: Self-pay | Admitting: Family Medicine

## 2023-11-08 NOTE — Progress Notes (Signed)
 See mychart note Dear [name], Your lab results look mostly good: diabetes and cholesterol and thyroid levels are all good. So no changes are needed with your medications.  Your vitamin d and b12 are both low so please start taking your supplements daily: I recommend otc Vit D 2000 units daily and vitamin B12 daily.  Your urine culture is negative so no infection is present.  Take care.  Sincerely, Dr. Mardelle Matte

## 2023-11-18 NOTE — Progress Notes (Deleted)
 Subjective:    Patient ID: Robin Morgan, female    DOB: 1956-05-21, 68 y.o.   MRN: [id number]  HPI   Pain Inventory Average Pain {NUMBERS; 0-10:5044} Pain Right Now {NUMBERS; 0-10:5044} My pain is {PAIN DESCRIPTION:21022940}  In the last 24 hours, has pain interfered with the following? General activity {NUMBERS; 0-10:5044} Relation with others {NUMBERS; 0-10:5044} Enjoyment of life {NUMBERS; 0-10:5044} What TIME of day is your pain at its worst? {time of day:24191} Sleep (in general) {BHH GOOD/FAIR/POOR:22877}  Pain is worse with: {ACTIVITIES:21022942} Pain improves with: {PAIN IMPROVES NWGN:56213086} Relief from Meds: {NUMBERS; 0-10:5044}  Family History  Problem Relation Age of Onset   Alzheimer's disease Mother    Diabetes Father    Heart disease Maternal Grandmother    Breast cancer Paternal Grandmother 68   Social History   Socioeconomic History   Marital status: Divorced    Spouse name: Not on file   Number of children: 1   Years of education: Not on file   Highest education level: Not on file  Occupational History   Occupation: disabled  Tobacco Use   Smoking status: Never   Smokeless tobacco: Never  Vaping Use   Vaping status: Never Used  Substance and Sexual Activity   Alcohol use: No   Drug use: No   Sexual activity: Not Currently  Other Topics Concern   Not on file  Social History Narrative   Not on file   Social Drivers of Health   Financial Resource Strain: Low Risk  (02/18/2023)   Overall Financial Resource Strain (CARDIA)    Difficulty of Paying Living Expenses: Not hard at all  Food Insecurity: No Food Insecurity (02/18/2023)   Hunger Vital Sign    Worried About Running Out of Food in the Last Year: Never true    Ran Out of Food in the Last Year: Never true  Transportation Needs: No Transportation Needs (02/18/2023)   PRAPARE - Administrator, Civil Service (Medical): No    Lack of Transportation (Non-Medical): No   Physical Activity: Inactive (02/18/2023)   Exercise Vital Sign    Days of Exercise per Week: 0 days    Minutes of Exercise per Session: 0 min  Stress: No Stress Concern Present (02/18/2023)   [employer] of Occupational Health - Occupational Stress Questionnaire    Feeling of Stress : Only a little  Social Connections: Socially Isolated (02/18/2023)   Social Connection and Isolation Panel [NHANES]    Frequency of Communication with Friends and Family: More than three times a week    Frequency of Social Gatherings with Friends and Family: Once a week    Attends Religious Services: Never    Database administrator or Organizations: No    Attends Engineer, structural: Never    Marital Status: Divorced   Past Surgical History:  Procedure Laterality Date   ABDOMINAL HYSTERECTOMY     1986   APPENDECTOMY     1983   BREAST EXCISIONAL BIOPSY Right 1990's   BREAST SURGERY     rt breast cyst done in the 90's   CHOLECYSTECTOMY     GALLBLADDER SURGERY     1983   ROTATOR CUFF REPAIR     right   SPINE SURGERY     TONSILLECTOMY     1973   Past Surgical History:  Procedure Laterality Date   ABDOMINAL HYSTERECTOMY     1986   APPENDECTOMY     1983   BREAST  EXCISIONAL BIOPSY Right 1990's   BREAST SURGERY     rt breast cyst done in the 90's   CHOLECYSTECTOMY     GALLBLADDER SURGERY     1983   ROTATOR CUFF REPAIR     right   SPINE SURGERY     TONSILLECTOMY     1973   Past Medical History:  Diagnosis Date   Calcific tendonitis    Cervical spondylosis without myelopathy    Depression    Diabetes mellitus    Esophageal stricture    Fibromyalgia    GERD (gastroesophageal reflux disease)    Hiatal hernia    Hyperlipidemia    Hypertension    There were no vitals taken for this visit.  Opioid Risk Score:   Fall Risk Score:  `1  Depression screen PHQ 2/9     10/06/2023    2:40 PM 09/24/2023    1:33 PM 08/25/2023    3:11 PM 06/07/2023   11:37 AM 04/22/2023    11:32 AM 04/22/2023   11:29 AM 02/18/2023    1:08 PM  Depression screen PHQ 2/9  Decreased Interest 0 0 1 3 3  0 0  Down, Depressed, Hopeless 0 0 1 3 2  0 0  PHQ - 2 Score 0 0 2 6 5  0 0  Altered sleeping 0    2 0 1  Tired, decreased energy 0    3 0 3  Change in appetite 0    1 0 2  Feeling bad or failure about yourself  0    0 0 1  Trouble concentrating 0    0 0 1  Moving slowly or fidgety/restless 0    0 0 0  Suicidal thoughts 0    0 0 0  PHQ-9 Score 0    11 0 8  Difficult doing work/chores Not difficult at all    Very difficult Not difficult at all Somewhat difficult    Review of Systems     Objective:   Physical Exam        Assessment & Plan:

## 2023-11-19 ENCOUNTER — Encounter: Payer: 59 | Admitting: Registered Nurse

## 2023-11-24 ENCOUNTER — Other Ambulatory Visit: Payer: Self-pay | Admitting: Physical Medicine & Rehabilitation

## 2023-12-02 ENCOUNTER — Other Ambulatory Visit: Payer: Self-pay | Admitting: Family Medicine

## 2023-12-02 ENCOUNTER — Encounter: Payer: Self-pay | Admitting: Registered Nurse

## 2023-12-02 ENCOUNTER — Encounter: Attending: Registered Nurse | Admitting: Registered Nurse

## 2023-12-02 VITALS — BP 108/56 | HR 92 | Ht 61.0 in | Wt 133.4 lb

## 2023-12-02 DIAGNOSIS — M5412 Radiculopathy, cervical region: Secondary | ICD-10-CM | POA: Insufficient documentation

## 2023-12-02 DIAGNOSIS — M542 Cervicalgia: Secondary | ICD-10-CM | POA: Insufficient documentation

## 2023-12-02 DIAGNOSIS — Z5181 Encounter for therapeutic drug level monitoring: Secondary | ICD-10-CM | POA: Diagnosis not present

## 2023-12-02 DIAGNOSIS — G894 Chronic pain syndrome: Secondary | ICD-10-CM | POA: Insufficient documentation

## 2023-12-02 DIAGNOSIS — M7061 Trochanteric bursitis, right hip: Secondary | ICD-10-CM | POA: Insufficient documentation

## 2023-12-02 DIAGNOSIS — Z79891 Long term (current) use of opiate analgesic: Secondary | ICD-10-CM | POA: Insufficient documentation

## 2023-12-02 DIAGNOSIS — M5010 Cervical disc disorder with radiculopathy, unspecified cervical region: Secondary | ICD-10-CM | POA: Insufficient documentation

## 2023-12-02 DIAGNOSIS — G8929 Other chronic pain: Secondary | ICD-10-CM | POA: Insufficient documentation

## 2023-12-02 DIAGNOSIS — M5416 Radiculopathy, lumbar region: Secondary | ICD-10-CM | POA: Diagnosis not present

## 2023-12-02 DIAGNOSIS — M797 Fibromyalgia: Secondary | ICD-10-CM | POA: Diagnosis not present

## 2023-12-02 DIAGNOSIS — M546 Pain in thoracic spine: Secondary | ICD-10-CM | POA: Diagnosis not present

## 2023-12-02 NOTE — Progress Notes (Signed)
 Patient ID: Robin Morgan, female    DOB: 1956-02-06, 68 y.o.   MRN: [id number]  HPI: Robin Morgan is a 68 y.o. female who returns for follow up appointment for chronic pain and medication refill. She states her pain is located in her neck radiating into her bilateral shoulders, mid- lower back radiating into her right lower extremity and right hip. She also reports Fibro pain.. She rates her pain 8. Her current exercise regime is walking and performing stretching exercises.  Ms. Hanlan Morphine  equivalent is 46.15 MME.   Oral Swab was Ordered today.     Pain Inventory Average Pain 9 Pain Right Now 8 My pain is constant, sharp, stabbing, and aching  In the last 24 hours, has pain interfered with the following? General activity 10 Relation with others 8 Enjoyment of life 7 What TIME of day is your pain at its worst? daytime, evening, and night Sleep (in general) Poor  Pain is worse with: walking, bending, and sitting Pain improves with: rest and medication Relief from Meds: 9  Family History  Problem Relation Age of Onset   Alzheimer's disease Mother    Diabetes Father    Heart disease Maternal Grandmother    Breast cancer Paternal Grandmother 20   Social History   Socioeconomic History   Marital status: Divorced    Spouse name: Not on file   Number of children: 1   Years of education: Not on file   Highest education level: Not on file  Occupational History   Occupation: disabled  Tobacco Use   Smoking status: Never   Smokeless tobacco: Never  Vaping Use   Vaping status: Never Used  Substance and Sexual Activity   Alcohol  use: No   Drug use: No   Sexual activity: Not Currently  Other Topics Concern   Not on file  Social History Narrative   Not on file   Social Drivers of Health   Financial Resource Strain: Low Risk  (02/18/2023)   Overall Financial Resource Strain (CARDIA)    Difficulty of Paying Living Expenses: Not hard at all  Food Insecurity:  No Food Insecurity (02/18/2023)   Hunger Vital Sign    Worried About Running Out of Food in the Last Year: Never true    Ran Out of Food in the Last Year: Never true  Transportation Needs: No Transportation Needs (02/18/2023)   PRAPARE - Administrator, Civil Service (Medical): No    Lack of Transportation (Non-Medical): No  Physical Activity: Inactive (02/18/2023)   Exercise Vital Sign    Days of Exercise per Week: 0 days    Minutes of Exercise per Session: 0 min  Stress: No Stress Concern Present (02/18/2023)   [employer] of Occupational Health - Occupational Stress Questionnaire    Feeling of Stress : Only a little  Social Connections: Socially Isolated (02/18/2023)   Social Connection and Isolation Panel [NHANES]    Frequency of Communication with Friends and Family: More than three times a week    Frequency of Social Gatherings with Friends and Family: Once a week    Attends Religious Services: Never    Database administrator or Organizations: No    Attends Banker Meetings: Never    Marital Status: Divorced   Past Surgical History:  Procedure Laterality Date   ABDOMINAL HYSTERECTOMY     1986   APPENDECTOMY     1983   BREAST EXCISIONAL BIOPSY Right  1990's   BREAST SURGERY     rt breast cyst done in the 90's   CHOLECYSTECTOMY     GALLBLADDER SURGERY     1983   ROTATOR CUFF REPAIR     right   SPINE SURGERY     TONSILLECTOMY     1973   Past Surgical History:  Procedure Laterality Date   ABDOMINAL HYSTERECTOMY     1986   APPENDECTOMY     1983   BREAST EXCISIONAL BIOPSY Right 1990's   BREAST SURGERY     rt breast cyst done in the 90's   CHOLECYSTECTOMY     GALLBLADDER SURGERY     1983   ROTATOR CUFF REPAIR     right   SPINE SURGERY     TONSILLECTOMY     1973   Past Medical History:  Diagnosis Date   Calcific tendonitis    Cervical spondylosis without myelopathy    Depression    Diabetes mellitus    Esophageal stricture     Fibromyalgia    GERD (gastroesophageal reflux disease)    Hiatal hernia    Hyperlipidemia    Hypertension    BP (!) 108/56   Pulse 92   Ht 5\' 1"  (1.549 m)   Wt 133 lb 6.4 oz (60.5 kg)   SpO2 92%   BMI 25.21 kg/m   Opioid Risk Score:   Fall Risk Score:  `1  Depression screen PHQ 2/9     10/06/2023    2:40 PM 09/24/2023    1:33 PM 08/25/2023    3:11 PM 06/07/2023   11:37 AM 04/22/2023   11:32 AM 04/22/2023   11:29 AM 02/18/2023    1:08 PM  Depression screen PHQ 2/9  Decreased Interest 0 0 1 3 3  0 0  Down, Depressed, Hopeless 0 0 1 3 2  0 0  PHQ - 2 Score 0 0 2 6 5  0 0  Altered sleeping 0    2 0 1  Tired, decreased energy 0    3 0 3  Change in appetite 0    1 0 2  Feeling bad or failure about yourself  0    0 0 1  Trouble concentrating 0    0 0 1  Moving slowly or fidgety/restless 0    0 0 0  Suicidal thoughts 0    0 0 0  PHQ-9 Score 0    11 0 8  Difficult doing work/chores Not difficult at all    Very difficult Not difficult at all Somewhat difficult     Review of Systems  Respiratory:  Positive for apnea.        Has been having a lot of difficulty falling asleep when not trying...  Musculoskeletal:  Positive for back pain and neck pain.  All other systems reviewed and are negative.      Objective:   Physical Exam Vitals and nursing note reviewed.  Constitutional:      Appearance: Normal appearance.  Neck:     Comments: Cervical Paraspinal Tenderness: C-5-C-6 Cardiovascular:     Rate and Rhythm: Normal rate and regular rhythm.     Pulses: Normal pulses.     Heart sounds: Normal heart sounds.  Pulmonary:     Effort: Pulmonary effort is normal.     Breath sounds: Normal breath sounds.  Musculoskeletal:     Comments: Normal Muscle Bulk and Muscle Testing Reveals:  Upper Extremities: Full ROM and Muscle Strength 5/5 Thoracic and Lumbar  Hypersensitivity Right Greater Trochanter Tenderness  Lower Extremities: : Full ROM and Muscle Strength 5/5 Arises from Chair  with ease Narrow Based  Gait     Skin:    General: Skin is warm and dry.  Neurological:     Mental Status: She is alert and oriented to person, place, and time.  Psychiatric:        Mood and Affect: Mood normal.        Behavior: Behavior normal.         Assessment & Plan:  1. Fibromyalgia with myofascial pain: Continue home exercise program and heat therapy. Continue current medication regimen with Flexeril . 12/02/2023.  2. Rotator cuff syndrome on the right: Continue with stretching exercises and heat therapy. 12/02/2023 3. Cervicalgia. Post-laminectomy syndrome, facet arthropathy: Cervical Radiculopathy: Continue current medication regimen with Gabapentin . 12/02/2023  S/ P  EMG with Dr. Rachel Budds on 03/08/2017: Diagnosed with Moderate to Severe CTS of Left wrist: 12/02/2023. 4. Depression: Continue current medication regimen with Cymbalta . PCP Following. Current  dose 90 mg daily. 12/02/2023 5. Mid/low back pain/ Lumbar Spondylosis: Continue Current Medication and stretching and heat therapy. 12/02/2023 Continue : No script given, she will call if she needs a refill, she verbalizes understanding. Morphine  Sulfate IR 15 mg one tablet three times  a day as needed for pain # 80.  6. Lumbar Radiculopathy:  Continue current medication regimen with Gabapentin . 12/02/2023 7. OA of right hand: Continue Voltaren  gel/ May substitute with Voltaren  Tablet, she realizes she can't use both and verbalizes understanding. Continue with  heat therapy. 12/02/2023 8. Migraines: Continue with headache Journal.  Continue to Monitor  12/02/2023 9. Left CTS:S/P EMG on 03/08/2017: S/P Carpal Tunnel Release on 06/14/2017 via . Dr. Ortman. 12/02/2023 10.. Muscle Spasm: Continue Tizanidine   Continue to Monitor. 12/02/2023 11. Left Shoulder Pain:No complaints today.  Ortho Following: Her scheduled  surgery is pending at this time.  Continue HEP as Tolerated. Continue to Monitor. 12/02/2023  12. Fall at Home: No falls  since last visit. Continue to Monitor. 12/02/2023      F/U in 2 months

## 2023-12-07 LAB — DRUG TOX MONITOR 1 W/CONF, ORAL FLD

## 2023-12-07 LAB — DRUG TOX ALC METAB W/CON, ORAL FLD: Alcohol Metabolite: NEGATIVE ng/mL (ref <25)

## 2023-12-08 ENCOUNTER — Other Ambulatory Visit: Payer: Self-pay | Admitting: Physical Medicine & Rehabilitation

## 2023-12-08 DIAGNOSIS — G43019 Migraine without aura, intractable, without status migrainosus: Secondary | ICD-10-CM

## 2023-12-08 NOTE — Telephone Encounter (Signed)
 Call Placed to Ms. Waits,  She is taking her Topamax , nightly. Prescription sent to pharmacy.

## 2023-12-20 ENCOUNTER — Encounter: Payer: 59 | Admitting: Psychology

## 2023-12-24 ENCOUNTER — Other Ambulatory Visit: Payer: Self-pay | Admitting: Family Medicine

## 2023-12-24 ENCOUNTER — Other Ambulatory Visit: Payer: Self-pay | Admitting: Family

## 2024-01-05 ENCOUNTER — Other Ambulatory Visit: Payer: Self-pay | Admitting: Physical Medicine & Rehabilitation

## 2024-01-05 DIAGNOSIS — G43019 Migraine without aura, intractable, without status migrainosus: Secondary | ICD-10-CM

## 2024-01-17 ENCOUNTER — Other Ambulatory Visit: Payer: Self-pay

## 2024-01-17 MED ORDER — MORPHINE SULFATE 15 MG PO TABS: ORAL_TABLET | ORAL | 0 refills | Status: DC

## 2024-01-17 NOTE — Telephone Encounter (Signed)
 PMP REPORT:  Filled  Written  ID  Drug  QTY  Days  Prescriber  RX #  Dispenser  Refill  Daily Dose*  Pymt Type  PMP  11/01/2023 10/30/2023 1  Morphine  Sulfate Ir 15 Mg Tab 80.00 26 Za Swa [id number] Gib (4800) 0/0 46.15 MME Comm Ins [place] 09/21/2023 09/21/2023 1  Morphine  Sulfate Ir 15 Mg Tab 80.00 27 Za Swa [id number] Gib (4800) 0/0 44.44 MME Comm Ins [place]  Patient has #20 tabs  on hand. I called her pharmacy and they it on hand. Per patient she was advised by you to call in the request, in advance.

## 2024-01-17 NOTE — Telephone Encounter (Signed)
 PMP was Reviewed.  Robin Morgan was called,  MSIR e-scribed to pharmacy, she verbalizes understanding. She has a scheduled appointment this month.

## 2024-02-03 ENCOUNTER — Encounter: Payer: Self-pay | Admitting: Registered Nurse

## 2024-02-03 ENCOUNTER — Encounter: Attending: Registered Nurse | Admitting: Registered Nurse

## 2024-02-03 VITALS — BP 118/64 | HR 101 | Ht 61.0 in | Wt 135.0 lb

## 2024-02-03 DIAGNOSIS — F32A Depression, unspecified: Secondary | ICD-10-CM | POA: Diagnosis not present

## 2024-02-03 DIAGNOSIS — M545 Low back pain, unspecified: Secondary | ICD-10-CM | POA: Insufficient documentation

## 2024-02-03 DIAGNOSIS — M25511 Pain in right shoulder: Secondary | ICD-10-CM | POA: Insufficient documentation

## 2024-02-03 DIAGNOSIS — Z79891 Long term (current) use of opiate analgesic: Secondary | ICD-10-CM | POA: Insufficient documentation

## 2024-02-03 DIAGNOSIS — G43909 Migraine, unspecified, not intractable, without status migrainosus: Secondary | ICD-10-CM | POA: Insufficient documentation

## 2024-02-03 DIAGNOSIS — M546 Pain in thoracic spine: Secondary | ICD-10-CM | POA: Diagnosis not present

## 2024-02-03 DIAGNOSIS — G5602 Carpal tunnel syndrome, left upper limb: Secondary | ICD-10-CM | POA: Insufficient documentation

## 2024-02-03 DIAGNOSIS — M19041 Primary osteoarthritis, right hand: Secondary | ICD-10-CM | POA: Diagnosis not present

## 2024-02-03 DIAGNOSIS — W19XXXD Unspecified fall, subsequent encounter: Secondary | ICD-10-CM | POA: Insufficient documentation

## 2024-02-03 DIAGNOSIS — M797 Fibromyalgia: Secondary | ICD-10-CM | POA: Insufficient documentation

## 2024-02-03 DIAGNOSIS — G894 Chronic pain syndrome: Secondary | ICD-10-CM | POA: Diagnosis not present

## 2024-02-03 DIAGNOSIS — M25512 Pain in left shoulder: Secondary | ICD-10-CM | POA: Insufficient documentation

## 2024-02-03 DIAGNOSIS — G8929 Other chronic pain: Secondary | ICD-10-CM | POA: Diagnosis not present

## 2024-02-03 DIAGNOSIS — M961 Postlaminectomy syndrome, not elsewhere classified: Secondary | ICD-10-CM | POA: Diagnosis not present

## 2024-02-03 DIAGNOSIS — M62838 Other muscle spasm: Secondary | ICD-10-CM | POA: Insufficient documentation

## 2024-02-03 DIAGNOSIS — M5416 Radiculopathy, lumbar region: Secondary | ICD-10-CM

## 2024-02-03 DIAGNOSIS — R5381 Other malaise: Secondary | ICD-10-CM | POA: Insufficient documentation

## 2024-02-03 DIAGNOSIS — M4726 Other spondylosis with radiculopathy, lumbar region: Secondary | ICD-10-CM | POA: Insufficient documentation

## 2024-02-03 DIAGNOSIS — Z5181 Encounter for therapeutic drug level monitoring: Secondary | ICD-10-CM | POA: Diagnosis not present

## 2024-02-03 DIAGNOSIS — M4722 Other spondylosis with radiculopathy, cervical region: Secondary | ICD-10-CM | POA: Diagnosis not present

## 2024-02-03 DIAGNOSIS — M75101 Unspecified rotator cuff tear or rupture of right shoulder, not specified as traumatic: Secondary | ICD-10-CM | POA: Diagnosis not present

## 2024-02-03 MED ORDER — MORPHINE SULFATE 15 MG PO TABS: ORAL_TABLET | ORAL | 0 refills | Status: DC

## 2024-02-03 NOTE — Progress Notes (Signed)
 Subjective:    Patient ID: Robin Morgan, female    DOB: Mar 27, 1956, 68 y.o.   MRN: [id number]  HPI: Robin Morgan is a 68 y.o. female who returns for follow up appointment for chronic pain and medication refill. She states her pain is located in her bilateral shoulders L>R and upper- lower back pain. She rates her pain 9. Her current exercise regime is walking and performing stretching exercises.  Robin Morgan reports when she was in [place], she squatted to pick up something from the bottom shelf, and was unable to stand up, she states she crawled on the floor to her shopping cart  and fell on her right side. She states she was eventually able to pick herself up. She didn't seek medical attention . Robin Morgan reports at times her upper and lower extremities feel weak, she also states for the last three days she was in bed, she didn't call this provider or her PCP.  She was educated on falls Prevention. Referral placed for Physical therapy, she was instructed to call for Free Balance screening, she verbalizes understanding.   Robin Morgan Morphine  equivalent is 46.45 MME.   Last Oral Swab was Performed on 12/02/2023, no medication. Robin Morgan states she is taking her Morphine  at least twice a day and when pain is severe, three times a day as needed for pain.     Pain Inventory Average Pain 10 Pain Right Now 9 My pain is constant, sharp, stabbing, and aching  In the last 24 hours, has pain interfered with the following? General activity 10 Relation with others 10 Enjoyment of life 10 What TIME of day is your pain at its worst? daytime, evening, and night Sleep (in general) Poor  Pain is worse with: bending and standing Pain improves with: rest and medication Relief from Meds: 9  Family History  Problem Relation Age of Onset   Alzheimer's disease Mother    Diabetes Father    Heart disease Maternal Grandmother    Breast cancer Paternal Grandmother 16   Social History    Socioeconomic History   Marital status: Divorced    Spouse name: Not on file   Number of children: 1   Years of education: Not on file   Highest education level: Not on file  Occupational History   Occupation: disabled  Tobacco Use   Smoking status: Never   Smokeless tobacco: Never  Vaping Use   Vaping status: Never Used  Substance and Sexual Activity   Alcohol  use: No   Drug use: No   Sexual activity: Not Currently  Other Topics Concern   Not on file  Social History Narrative   Not on file   Social Drivers of Health   Financial Resource Strain: Low Risk  (02/18/2023)   Overall Financial Resource Strain (CARDIA)    Difficulty of Paying Living Expenses: Not hard at all  Food Insecurity: No Food Insecurity (02/18/2023)   Hunger Vital Sign    Worried About Running Out of Food in the Last Year: Never true    Ran Out of Food in the Last Year: Never true  Transportation Needs: No Transportation Needs (02/18/2023)   PRAPARE - Administrator, Civil Service (Medical): No    Lack of Transportation (Non-Medical): No  Physical Activity: Inactive (02/18/2023)   Exercise Vital Sign    Days of Exercise per Week: 0 days    Minutes of Exercise per Session: 0 min  Stress: No Stress Concern Present (  02/18/2023)   [employer]ational [employer] - Occupational Stress Questionnaire    Feeling of Stress : Only a little  Social Connections: Socially Isolated (02/18/2023)   Social Connection and Isolation Panel    Frequency of Communication with Friends and Family: More than three times a week    Frequency of Social Gatherings with Friends and Family: Once a week    Attends Religious Services: Never    Database administrator or Organizations: No    Attends Engineer, structural: Never    Marital Status: Divorced   Past Surgical History:  Procedure Laterality Date   ABDOMINAL HYSTERECTOMY     1986   APPENDECTOMY     1983   BREAST EXCISIONAL BIOPSY Right  1990's   BREAST SURGERY     rt breast cyst done in the 90's   CHOLECYSTECTOMY     GALLBLADDER SURGERY     1983   ROTATOR CUFF REPAIR     right   SPINE SURGERY     TONSILLECTOMY     1973   Past Surgical History:  Procedure Laterality Date   ABDOMINAL HYSTERECTOMY     1986   APPENDECTOMY     1983   BREAST EXCISIONAL BIOPSY Right 1990's   BREAST SURGERY     rt breast cyst done in the 90's   CHOLECYSTECTOMY     GALLBLADDER SURGERY     1983   ROTATOR CUFF REPAIR     right   SPINE SURGERY     TONSILLECTOMY     1973   Past Medical History:  Diagnosis Date   Calcific tendonitis    Cervical spondylosis without myelopathy    Depression    Diabetes mellitus    Esophageal stricture    Fibromyalgia    GERD (gastroesophageal reflux disease)    Hiatal hernia    Hyperlipidemia    Hypertension    Ht 5' 1 (1.549 m)   Wt 135 lb (61.2 kg)   BMI 25.51 kg/m   Opioid Risk Score:   Fall Risk Score:  `1  Depression screen PHQ 2/9     12/02/2023    1:27 PM 10/06/2023    2:40 PM 09/24/2023    1:33 PM 08/25/2023    3:11 PM 06/07/2023   11:37 AM 04/22/2023   11:32 AM 04/22/2023   11:29 AM  Depression screen PHQ 2/9  Decreased Interest 3 0 0 1 3 3  0  Down, Depressed, Hopeless 3 0 0 1 3 2  0  PHQ - 2 Score 6 0 0 2 6 5  0  Altered sleeping  0    2 0  Tired, decreased energy  0    3 0  Change in appetite  0    1 0  Feeling bad or failure about yourself   0    0 0  Trouble concentrating  0    0 0  Moving slowly or fidgety/restless  0    0 0  Suicidal thoughts  0    0 0  PHQ-9 Score  0    11 0  Difficult doing work/chores  Not difficult at all    Very difficult Not difficult at all    Review of Systems  Musculoskeletal:  Positive for back pain and neck pain.       Pain in both shoulders & upper left arm  Neurological:  Positive for headaches.  All other systems reviewed and are negative.  Objective:   Physical Exam Vitals and nursing note reviewed.  Constitutional:       Appearance: Normal appearance.  Neck:     Comments: Cervical Paraspinal Tenderness: C-5-C-6 Cardiovascular:     Rate and Rhythm: Normal rate and regular rhythm.     Pulses: Normal pulses.     Heart sounds: Normal heart sounds.  Pulmonary:     Effort: Pulmonary effort is normal.     Breath sounds: Normal breath sounds.   Musculoskeletal:     Comments: Normal Muscle Bulk and Muscle Testing Reveals:  Upper Extremities: Full ROM and Muscle Strength 5/5 Thoracic, Hypersensitivity Lumbar Hypersensitivity Right Greater Trochanter Tenderness Lower Extremities: Full ROM and Muscle Strength 5/5 Arises from Chair with ease Narrow Based  Gait      Skin:    General: Skin is warm and dry.   Neurological:     Mental Status: She is alert and oriented to person, place, and time.   Psychiatric:        Mood and Affect: Mood normal.        Behavior: Behavior normal.         Assessment & Plan:  1. Acute Exacerbation of Chronic Low Back Pain: RX: Lumbar X-ray . Continue to Monitor. 2. Thoracic Back Pain: RX: Thoracic X-ray. Continue to Monitor 3. Physical Deconditioning: RX: Physical Therapy. Continue to Monitor.  4. Fall  subsequent encounter: Robin Morgan will call for Free Balance Screening at [facility] and Referral placed for Physical Therapy. Educated on Falls Prevention:  5. Fibromyalgia with myofascial pain: Continue home exercise program and heat therapy. Continue current medication regimen with Flexeril . 02/03/2024. 6.. Rotator cuff syndrome on the right: Continue with stretching exercises and heat therapy. 12/02/2023 7.. Cervicalgia. Post-laminectomy syndrome, facet arthropathy: Cervical Radiculopathy: Continue current medication regimen with Gabapentin . 02/03/2024  S/ P  EMG with Dr. Babs on 03/08/2017: Diagnosed with Moderate to Severe CTS of Left wrist: 02/03/2024. 8. Depression: Continue current medication regimen with Cymbalta . PCP Following. Current  dose 90 mg  daily. 02/03/2024 9. Mid/low back pain/ Lumbar Spondylosis: Continue Current Medication and stretching and heat therapy. 02/03/2024 Refilled: Morphine  Sulfate IR 15 mg one tablet three times  a day as needed for pain # 80.  10.. Lumbar Radiculopathy:  Continue current medication regimen with Gabapentin . 02/03/2024 11. OA of right hand: Continue Voltaren  gel/ May substitute with Voltaren  Tablet, she realizes she can't use both and verbalizes understanding. Continue with  heat therapy. 02/03/2024 12. Migraines: No complaints today. Continue with headache Journal.  Continue to Monitor  02/03/2024 13. Left CTS:S/P EMG on 03/08/2017: S/P Carpal Tunnel Release on 06/14/2017 via . Dr. Ahmad. 02/03/2024 14.. Muscle Spasm: Continue Tizanidine   Continue to Monitor. 02/03/2024 15.Bilateral Shoulder Pain:L>R: Ortho Following: Her scheduled  surgery is pending at this time.  Continue HEP as Tolerated. Continue to Monitor. 02/03/2024      F/U in 2 months

## 2024-02-03 NOTE — Patient Instructions (Signed)
 Free Balance Screening: [id number] [facility]: Screening performed by Physical Therapist

## 2024-02-14 ENCOUNTER — Telehealth: Payer: Self-pay | Admitting: Registered Nurse

## 2024-02-14 NOTE — Telephone Encounter (Signed)
 Patient left a voicemail stating she needed a call back from  [place].  Patient didn't say what she wanted.

## 2024-02-15 ENCOUNTER — Other Ambulatory Visit: Payer: Self-pay | Admitting: Family Medicine

## 2024-02-15 NOTE — Telephone Encounter (Signed)
 10/25/2023 LOV  08/05/2023 fill date  30/5 refills

## 2024-02-21 ENCOUNTER — Other Ambulatory Visit: Payer: Self-pay | Admitting: Family Medicine

## 2024-02-21 NOTE — Telephone Encounter (Signed)
 Return Ms. Mondesir call,  No answer left message to return thre call

## 2024-03-07 ENCOUNTER — Telehealth: Payer: Self-pay | Admitting: Registered Nurse

## 2024-03-07 NOTE — Telephone Encounter (Signed)
 Patient has about 8 days left on her morphine .  Can someone call her when this can be refilled?  [facility] in [place] and [facility] may have it 15mg  her normal pharmacy can't get it.

## 2024-03-07 NOTE — Telephone Encounter (Signed)
 MyChart message sent to Ms Veloso

## 2024-03-08 ENCOUNTER — Other Ambulatory Visit: Payer: Self-pay | Admitting: Family Medicine

## 2024-03-13 ENCOUNTER — Telehealth: Payer: Self-pay

## 2024-03-13 DIAGNOSIS — M810 Age-related osteoporosis without current pathological fracture: Secondary | ICD-10-CM

## 2024-03-13 NOTE — Telephone Encounter (Signed)
 Medical Buy and Bill  Prolia  VOB initiated via [email or web address]  Next Prolia  inj DUE: new start     Teressa Romualdo DASEN, CPhT    10/28/23 11:09 AM Note Insurance requires failure of oral and IV bisphosphates. Patient can get Prolia  filled through pharmacy and sent to the office.      Pharmacy benefit: Copay $0 (Paid to pharmacy) Admin Fee: 0% (Pay at clinic)   Prior Auth: [name] PA# Expiration Date:   # of doses approved:     If patient wants fill through the pharmacy benefit please send prescription to: [email or web address], and include estimated need by date in rx notes. Pharmacy will ship medication directly to the office.   Patient NOT eligible for Prolia  Copay Card. Copay Card can make patient's cost as little as $25. Link to apply: https://www.amgensupportplus.[email or web address]/copay   ** This summary of benefits is an estimation of the patient's out-of-pocket cost. Exact cost may very based on individual plan coverage.      Toribio Laneta LABOR, CPhT    10/28/23 10:25 AM Note Pharmacy Patient Advocate Encounter   Received notification from [employer] Portal that prior authorization for Prolia  is required/requested.   Insurance verification completed.   The patient is insured through [employer] .   Per test claim: PA required; PA submitted to above mentioned insurance via [facility] Portal Key/confirmation #/EOC [id number] Status is pending

## 2024-03-15 ENCOUNTER — Encounter: Attending: Registered Nurse | Admitting: Psychology

## 2024-03-15 DIAGNOSIS — G894 Chronic pain syndrome: Secondary | ICD-10-CM | POA: Diagnosis present

## 2024-03-15 DIAGNOSIS — M25512 Pain in left shoulder: Secondary | ICD-10-CM | POA: Diagnosis present

## 2024-03-15 DIAGNOSIS — G8929 Other chronic pain: Secondary | ICD-10-CM | POA: Insufficient documentation

## 2024-03-15 DIAGNOSIS — M546 Pain in thoracic spine: Secondary | ICD-10-CM | POA: Diagnosis present

## 2024-03-15 DIAGNOSIS — M25511 Pain in right shoulder: Secondary | ICD-10-CM | POA: Insufficient documentation

## 2024-03-15 DIAGNOSIS — M797 Fibromyalgia: Secondary | ICD-10-CM | POA: Diagnosis present

## 2024-03-15 DIAGNOSIS — F339 Major depressive disorder, recurrent, unspecified: Secondary | ICD-10-CM | POA: Diagnosis present

## 2024-03-15 NOTE — Progress Notes (Signed)
 Neuropsychology Visit  Patient:  Robin Morgan   DOB: [birth year]  MR Number: [id number]  Location: [facility] [address] [place], [address] [place] [address] Dept: [phone]  Date of Service: 03/15/2024  Start: 11 AM End: 12 PM  Duration of Service: 1 Hour  Today's visit was conducted in my outpatient clinic office with the patient myself present.  Today's visit was an in person visit.  Provider/Observer:     Norleen JONELLE Asa PsyD  Chief Complaint:      Chief Complaint  Patient presents with   Anxiety   Depression   Neck Pain   Back Pain   Stress   Sleeping Problem    Reason For Service:     Robin Morgan is a 68 year old right-handed female referred by Dr. Babs for neuropsychological/psychological consultation due to significant depression and anxiety with primary stressors related to family issues in the setting of fibromyalgia and chronic severe pain symptoms.  The patient reports that she has had significant pain in her back primarily mid back for 20 years or more.  The patient is also been diagnosed with fibromyalgia.  The patient had surgery on her neck to try to alleviate help with her severe recurrent headaches but experience little change post surgery.  The patient also has significant shoulder pain and severe headache with these pain and headaches resulting in her having to spend multiple days at a time essentially in bed.  The patient is continued to struggle with anxiety and depressive symptoms on top of her chronic pain disorder.  The patient was asked to help take care of her father who has dementia and her sisters expected her to stay there for an extended period of time.  The patient was there for almost a week experiencing increasing problems due to poor sleeping conditions and an exacerbation of her underlying chronic pain symptoms.  There continues to be  stressors and animosity between the patient and her siblings.  There continue to be a lot of stress between the patient and her sisters particularly with 1 sister as they continue to struggle with managing the care of her father.  The patient had a fall with concussive event and neck injury/exacerbation of previous issues and worsening of shoulder pain.  The patient is struggled with managing her pain.  03/15/2024::                         Presents with significant stress regarding an impending shortage of prescribed morphine , which is used for chronic neck and back pain. The pharmacist and provider Robin Ned, NP) have both indicated a supply chain issue, which is causing difficulty in obtaining the medication. This has caused considerable anxiety about managing severe pain and potential withdrawal symptoms. The pain in the neck and back is described as excruciating, particularly if a dose is missed. Sleep has been poor for the past three months due to severe neck pain, requiring specific pillow positioning. A fall approximately a year ago, landing face-first on a cement porch, may have exacerbated back and neck issues. An X-ray was performed, but the radiologist recommended an MRI, which was not approved by the insurance company. There is concern about the change in care from Dr. Babs to NP Fidela Morgan, perceiving it as a result of dissatisfaction after an outside provider identified a tear in the shoulder, though it was explained this  was due to Dr. Naaman new administrative role and resulting reduction of time in the outpatient clinic.   Reports ongoing problems with pain in the neck and back. Has also noted progressive muscle weakness in the legs, making it difficult to stand up from a squatting position without assistance. An incident in a store required crawling to a shopping cart to pull themself up, resulting in a fall from exhaustion. Has also been experiencing severe headaches, for which  morphine  is ineffective.   Has previously attended physical therapy for pain, which was reported as not helpful and frustrating. A recent experience with a therapist was negative, as the therapist refused to perform dry needling for headaches as recommended by Dr. Babs.  Patient was recommended returning to physical therapy, specifically to address the loss of muscle mass and weakness in the legs to prevent loss of independence and risk of falls. There is reluctance to return due to past disappointments but is willing to try again if it will be helpful.  Treatment Interventions:  Therapeutic interventions for issues related to chronic pain/fibromyalgia and recurrent headaches and depression anxiety symptoms with significant psychosocial stressors.  Participation Level:   Active  Participation Quality:  Appropriate and Attentive      Behavioral Observation:  Well Groomed, Alert, and Appropriate.   Current Psychosocial Factors: Expresses significant stress related to the potential morphine  shortage. There is also distress related to interactions with family, particularly concerning their 31 year old father's safety and independence. The father, [place], continues to engage in physically demanding activities despite his age and recent falls, causing worry. There is family conflict with a younger sister who is perceived as manipulative and has been taking items from the father's house. The father also exhibits difficult behaviors, including being critical of food prepared for him and memory issues, which are causing stress for the sisters providing care. It is suspected these behaviors are related to age-related cognitive decline or dementia. The father has lupus and recently lost his last living brother, contributing to his emotional state. He has also had to stop driving and hunting due to his physical condition.  Content of Session:   Reviewed current stressors, including the morphine   supply issue and family dynamics related to their aging father. Discussed the reasons for the medication shortage, explaining it as a systemic supply chain and economic issue rather than a personal one. Reassurance was provided regarding the clinic's commitment to managing pain, including prescribing an alternative if necessary. Explored the change in care from Dr. Babs to NP Debby, clarifying it was due to administrative changes in Dr. Naaman role and not personal dissatisfaction and that he continues to be her provider. Discussed the importance of physical therapy for addressing leg weakness and maintaining independence, differentiating this goal from previous unsuccessful therapy for pain. Provided strategies for communicating with their father about safety and care needs, and for managing conflict with their sister over the father's belongings, including taking a photograph of a sentimental picture to reduce conflict.  Effectiveness of Interventions: The patient has been quite open and active in these therapeutic processes and rapport been easily established.  The patient's cognitive function is good and while stress affects her ability to stay focused this is not indicative of any type of cognitive deficits with the patient.  The patient is already working on some of the initial issues we have focused around sleep hygiene, physical activity and other coping issues around her chronic pain symptoms.  Target Goals:  Management of chronic pain, particularly in the neck and back. Addressing progressive leg weakness and loss of muscle mass to maintain independence and prevent falls. Managing stress related to medication availability and family conflicts.   Goals Last Reviewed:   03/15/2024  Goals Addressed Today:    :                 Addressed anxiety regarding the morphine  shortage and provided reassurance about continuity of care. Explored feelings of rejection by Dr. Babs and provided context for the  change in providers with Dr. Babs still being her physician. Discussed the rationale for returning to physical therapy with a new focus on strengthening. Provided psychoeducation on the father's age-related cognitive changes and behavioral symptoms. Offered strategies for managing family conflict and reducing personal stress.  Impression/Diagnosis:   Debarah Mccumbers. Benbrook is a 68 year old right-handed female referred by Dr. Babs for neuropsychological/psychological consultation due to significant depression and anxiety with primary stressors related to family issues in the setting of fibromyalgia and chronic severe pain symptoms.  The patient reports that she has had significant pain in her back primarily mid back for 20 years or more.  The patient is also been diagnosed with fibromyalgia.  The patient had surgery on her neck to try to alleviate help with her severe recurrent headaches but experience little change post surgery.  The patient also has significant shoulder pain and severe headache with these pain and headaches resulting in her having to spend multiple days at a time essentially in bed.   I do think that the patient's anxiety, stress and depressive symptoms do play and exacerbating role in her overall pain symptoms and while she clearly has abnormalities in lumbar and cervical regions as a primary factor for her pain symptoms her stress responses, fibromyalgia and depression do play a role in the acute day-to-day level of her pain.  Today we have continue to work on therapeutic interventions around chronic pain and building better coping strategies for the numerous stressors that she is facing particularly around issues with her family and the impact that stress has on her chronic pain difficulties.  Diagnosis:   Depression, recurrent (HCC)  Chronic pain syndrome  Chronic bilateral thoracic back pain  Chronic pain of both shoulders  Fibromyalgia    Norleen Asa, Psy.D. Clinical  Psychologist Neuropsychologist

## 2024-03-21 ENCOUNTER — Other Ambulatory Visit: Payer: Self-pay

## 2024-03-21 ENCOUNTER — Telehealth: Payer: Self-pay

## 2024-03-21 MED ORDER — MORPHINE SULFATE 15 MG PO TABS
15.0000 mg | ORAL_TABLET | Freq: Three times a day (TID) | ORAL | 0 refills | Status: DC | PRN
  Filled 2024-03-21: qty 80, 27d supply, fill #0

## 2024-03-21 NOTE — Telephone Encounter (Signed)
 Called and spoke with patient regarding reaching out to various pharmacies to ensure that they have the Morphine  15 mg IR in stock prior to our office sending over the prescription due to back order of this medication with several pharmacies.  Patient called [facility] at [phone] and patient states that she spoke with the Pharmacist to make sure that they have enough in-stock to fill the prescription if our office sends it over. Patient advised that Fidela is currently seeing patients and will send it over electronically at the earliest convenience.  Patient verbalized understanding.  Message will be routed to [place] to address.

## 2024-03-21 NOTE — Telephone Encounter (Signed)
 PMP was Reviewed.  MSIR e-scribed to pharmacy.  Robin Morgan is aware of the above.

## 2024-04-04 ENCOUNTER — Other Ambulatory Visit: Payer: Self-pay | Admitting: Pharmacy Technician

## 2024-04-04 ENCOUNTER — Other Ambulatory Visit: Payer: Self-pay

## 2024-04-04 MED ORDER — DENOSUMAB 60 MG/ML ~~LOC~~ SOSY
60.0000 mg | PREFILLED_SYRINGE | Freq: Once | SUBCUTANEOUS | 0 refills | Status: AC
  Filled 2024-04-04: qty 1, 1d supply, fill #0
  Filled 2024-04-05: qty 1, 180d supply, fill #0

## 2024-04-04 NOTE — Telephone Encounter (Signed)
 Rx sent to [facility], and it will be delivered to our office.

## 2024-04-04 NOTE — Progress Notes (Signed)
 Pharmacy Patient Advocate Encounter  Insurance verification completed.   The patient is insured through [employer] claim for Prolia . Co-pay is $0.  This test claim was processed through [facility]- copay amounts may vary at other pharmacies due to pharmacy/plan contracts, or as the patient moves through the different stages of their insurance plan.

## 2024-04-04 NOTE — Addendum Note (Signed)
 Addended by: Oletta Buehring on: 04/04/2024 02:44 PM   Modules accepted: Orders

## 2024-04-04 NOTE — Telephone Encounter (Signed)
 Scheduled for 04/13/24. States $0 Admin fee. Please advise

## 2024-04-05 ENCOUNTER — Encounter: Payer: Self-pay | Admitting: Psychology

## 2024-04-05 ENCOUNTER — Other Ambulatory Visit: Payer: Self-pay

## 2024-04-05 ENCOUNTER — Ambulatory Visit: Admitting: Registered Nurse

## 2024-04-05 NOTE — Progress Notes (Signed)
 Specialty Pharmacy Initial Fill Coordination Note  Robin Morgan is a 68 y.o. female contacted today regarding initial fill of specialty medication(s) Denosumab  (PROLIA )   Patient requested Courier to Provider Office   Delivery date: 04/11/24   Verified address: [facility] [place] [phone]   Medication will be filled on 8/29.   Patient is aware of $0 copayment.

## 2024-04-06 NOTE — Telephone Encounter (Signed)
 Specialty Pharmacy Initial Fill Coordination Note  Robin Morgan is a 68 y.o. female contacted today regarding initial fill of specialty medication(s) Denosumab  (PROLIA )   Patient requested Courier to Provider Office   Delivery date: 04/11/24   Verified address: [facility] [place] [phone]   Medication will be filled on 8/29.   Patient is aware of $0 copayment.

## 2024-04-07 ENCOUNTER — Other Ambulatory Visit: Payer: Self-pay

## 2024-04-11 NOTE — Telephone Encounter (Signed)
 Prolia  was delivered and placed in POD A's fridge. Pt is scheduled for 04/13/24

## 2024-04-11 NOTE — Progress Notes (Signed)
 Subjective:    Patient ID: Robin Morgan, female    DOB: 08-01-1956, 68 y.o.   MRN: [id number]  YEP:Robin Morgan is a 68 y.o. female who returns for follow up appointment for chronic pain and medication refill. She states her pain is located in her neck radiating into her left shoulder, upper back mainly left side and lower back pain. She also reports increase intensity of lower back pain with standing > 10 minutes and right hip pain. We will order X-rays, she verbalizes understanding.  . She rates her pain 8. Her current exercise regime is walking and performing stretching exercises.  Robin Morgan  equivalent is 45.00 MME.   Oral Swab was Performed today.      Pain Inventory Average Pain 9 Pain Right Now 8 My pain is constant, stabbing, tingling, and aching  In the last 24 hours, has pain interfered with the following? General activity 8 Relation with others 8 Enjoyment of life 7 What TIME of day is your pain at its worst? morning , evening, and night Sleep (in general) Poor  Pain is worse with: sitting and standing Pain improves with: rest and medication Relief from Meds: 9  Family History  Problem Relation Age of Onset   Alzheimer's disease Mother    Diabetes Father    Heart disease Maternal Grandmother    Breast cancer Paternal Grandmother 6   Social History   Socioeconomic History   Marital status: Divorced    Spouse name: Not on file   Number of children: 1   Years of education: Not on file   Highest education level: Not on file  Occupational History   Occupation: disabled  Tobacco Use   Smoking status: Never   Smokeless tobacco: Never  Vaping Use   Vaping status: Never Used  Substance and Sexual Activity   Alcohol  use: No   Drug use: No   Sexual activity: Not Currently  Other Topics Concern   Not on file  Social History Narrative   Not on file   Social Drivers of Health   Financial Resource Strain: Low Risk  (02/18/2023)   Overall  Financial Resource Strain (CARDIA)    Difficulty of Paying Living Expenses: Not hard at all  Food Insecurity: No Food Insecurity (02/18/2023)   Hunger Vital Sign    Worried About Running Out of Food in the Last Year: Never true    Ran Out of Food in the Last Year: Never true  Transportation Needs: No Transportation Needs (02/18/2023)   PRAPARE - Administrator, Civil Service (Medical): No    Lack of Transportation (Non-Medical): No  Physical Activity: Inactive (02/18/2023)   Exercise Vital Sign    Days of Exercise per Week: 0 days    Minutes of Exercise per Session: 0 min  Stress: No Stress Concern Present (02/18/2023)   [employer] of Occupational Health - Occupational Stress Questionnaire    Feeling of Stress : Only a little  Social Connections: Socially Isolated (02/18/2023)   Social Connection and Isolation Panel    Frequency of Communication with Friends and Family: More than three times a week    Frequency of Social Gatherings with Friends and Family: Once a week    Attends Religious Services: Never    Database administrator or Organizations: No    Attends Banker Meetings: Never    Marital Status: Divorced   Past Surgical History:  Procedure Laterality Date   ABDOMINAL HYSTERECTOMY  1986   APPENDECTOMY     1983   BREAST EXCISIONAL BIOPSY Right 1990's   BREAST SURGERY     rt breast cyst done in the 90's   CHOLECYSTECTOMY     GALLBLADDER SURGERY     1983   ROTATOR CUFF REPAIR     right   SPINE SURGERY     TONSILLECTOMY     1973   Past Surgical History:  Procedure Laterality Date   ABDOMINAL HYSTERECTOMY     1986   APPENDECTOMY     1983   BREAST EXCISIONAL BIOPSY Right 1990's   BREAST SURGERY     rt breast cyst done in the 90's   CHOLECYSTECTOMY     GALLBLADDER SURGERY     1983   ROTATOR CUFF REPAIR     right   SPINE SURGERY     TONSILLECTOMY     1973   Past Medical History:  Diagnosis Date   Calcific tendonitis     Cervical spondylosis without myelopathy    Depression    Diabetes mellitus    Esophageal stricture    Fibromyalgia    GERD (gastroesophageal reflux disease)    Hiatal hernia    Hyperlipidemia    Hypertension    There were no vitals taken for this visit.  Opioid Risk Score:   Fall Risk Score:  `1  Depression screen PHQ 2/9     02/03/2024    1:44 PM 12/02/2023    1:27 PM 10/06/2023    2:40 PM 09/24/2023    1:33 PM 08/25/2023    3:11 PM 06/07/2023   11:37 AM 04/22/2023   11:32 AM  Depression screen PHQ 2/9  Decreased Interest 1 3 0 0 1 3 3   Down, Depressed, Hopeless 1 3 0 0 1 3 2   PHQ - 2 Score 2 6 0 0 2 6 5   Altered sleeping   0    2  Tired, decreased energy   0    3  Change in appetite   0    1  Feeling bad or failure about yourself    0    0  Trouble concentrating   0    0  Moving slowly or fidgety/restless   0    0  Suicidal thoughts   0    0  PHQ-9 Score   0    11  Difficult doing work/chores   Not difficult at all    Very difficult    Review of Systems  Musculoskeletal:  Positive for back pain and neck pain.       Right buttock pain, left neck pain  All other systems reviewed and are negative.      Objective:   Physical Exam Vitals and nursing note reviewed.  Constitutional:      Appearance: Normal appearance.  Neck:     Comments: Cervical Paraspinal Tenderness: C-5-C-6  Cardiovascular:     Rate and Rhythm: Normal rate and regular rhythm.     Pulses: Normal pulses.     Heart sounds: Normal heart sounds.  Pulmonary:     Effort: Pulmonary effort is normal.     Breath sounds: Normal breath sounds.  Musculoskeletal:     Comments: Normal Muscle Bulk and Muscle Testing Reveals:  Upper Extremities: Right: Full ROM and Muscle Strength 5/5 Left Upper Extremity: Decreased ROM 90 Degrees and Muscle Strength 5/5 Left AC Joint Tenderness  Thoracic Paraspinal Tenderness: T-7-T-10  Lumbar Paraspinal Tenderness: L-4-L-5 Lower Extremities: Full  ROM and Muscle Strength  5/5 Arises from Table slowly Narrow Based  Gait     Skin:    General: Skin is warm and dry.  Neurological:     Mental Status: She is alert and oriented to person, place, and time.  Psychiatric:        Mood and Affect: Mood normal.        Behavior: Behavior normal.          Assessment & Plan:  1. Fibromyalgia with myofascial pain: Continue home exercise program and heat therapy. Continue current medication regimen with Flexeril . 04/11/2024.  2. Rotator cuff syndrome on the right: Continue with stretching exercises and heat therapy. 04/11/2024 3. Cervicalgia. Post-laminectomy syndrome, facet arthropathy: Cervical Radiculopathy: Continue current medication regimen with Gabapentin . 04/11/2024  S/ P  EMG with Dr. Babs on 03/08/2017: Diagnosed with Moderate to Severe CTS of Left wrist: 04/11/2024. 4. Depression: Continue current medication regimen with Cymbalta . PCP Following. Current  dose 90 mg daily. 04/11/2024 5. Mid/low back pain/ Lumbar Spondylosis: Continue Current Medication and stretching and heat therapy. 04/11/2024 Continue : No script given, she will call if she needs a refill, she verbalizes understanding. Morgan  Sulfate IR 15 mg one tablet three times  a day as needed for pain # 80.  6. Lumbar Radiculopathy:  Continue current medication regimen with Gabapentin . 04/11/2024 7. OA of right hand: Continue Voltaren  gel/ May substitute with Voltaren  Tablet, she realizes she can't use both and verbalizes understanding. Continue with  heat therapy. 04/11/2024 8. Migraines: Continue with headache Journal.  Continue to Monitor  04/11/2024 9. Left CTS:S/P EMG on 03/08/2017: S/P Carpal Tunnel Release on 06/14/2017 via . Dr. Ortman. 04/11/2024 10.. Muscle Spasm: Continue Tizanidine   Continue to Monitor. 04/11/2024 11. Left Shoulder Pain:Ortho Following: Her scheduled  surgery is pending at this time.  Continue HEP as Tolerated. Continue to Monitor. 04/11/2024  12. Fall at Home: No falls  since last visit. Continue to Monitor. 04/11/2024      F/U in 2 months

## 2024-04-12 ENCOUNTER — Encounter: Attending: Registered Nurse | Admitting: Registered Nurse

## 2024-04-12 ENCOUNTER — Other Ambulatory Visit: Payer: Self-pay

## 2024-04-12 ENCOUNTER — Encounter: Payer: Self-pay | Admitting: Registered Nurse

## 2024-04-12 VITALS — BP 130/77 | HR 89 | Ht 61.0 in | Wt 129.0 lb

## 2024-04-12 DIAGNOSIS — M542 Cervicalgia: Secondary | ICD-10-CM | POA: Diagnosis not present

## 2024-04-12 DIAGNOSIS — Z5181 Encounter for therapeutic drug level monitoring: Secondary | ICD-10-CM | POA: Insufficient documentation

## 2024-04-12 DIAGNOSIS — G8929 Other chronic pain: Secondary | ICD-10-CM | POA: Insufficient documentation

## 2024-04-12 DIAGNOSIS — M546 Pain in thoracic spine: Secondary | ICD-10-CM | POA: Diagnosis not present

## 2024-04-12 DIAGNOSIS — Z79891 Long term (current) use of opiate analgesic: Secondary | ICD-10-CM | POA: Insufficient documentation

## 2024-04-12 DIAGNOSIS — G894 Chronic pain syndrome: Secondary | ICD-10-CM | POA: Insufficient documentation

## 2024-04-12 DIAGNOSIS — M5412 Radiculopathy, cervical region: Secondary | ICD-10-CM | POA: Diagnosis not present

## 2024-04-12 DIAGNOSIS — M5416 Radiculopathy, lumbar region: Secondary | ICD-10-CM | POA: Diagnosis not present

## 2024-04-12 MED ORDER — MORPHINE SULFATE 15 MG PO TABS
15.0000 mg | ORAL_TABLET | Freq: Three times a day (TID) | ORAL | 0 refills | Status: DC | PRN
  Filled 2024-04-12 – 2024-04-24 (×3): qty 80, 27d supply, fill #0

## 2024-04-12 NOTE — Telephone Encounter (Signed)
 Noted

## 2024-04-13 ENCOUNTER — Ambulatory Visit (INDEPENDENT_AMBULATORY_CARE_PROVIDER_SITE_OTHER)

## 2024-04-13 DIAGNOSIS — M810 Age-related osteoporosis without current pathological fracture: Secondary | ICD-10-CM | POA: Diagnosis not present

## 2024-04-13 MED ORDER — DENOSUMAB 60 MG/ML ~~LOC~~ SOSY: 60.0000 mg | PREFILLED_SYRINGE | Freq: Once | SUBCUTANEOUS | Status: DC

## 2024-04-13 NOTE — Progress Notes (Addendum)
 Pt came in on the Nurse schedule to receive her Prolia  injection per Dr. Jodie. Administered in the Right arm without any complaints. Pt was advised to schedule next injection for 6mos.

## 2024-04-14 ENCOUNTER — Other Ambulatory Visit: Payer: Self-pay

## 2024-04-14 LAB — DRUG TOX MONITOR 1 W/CONF, ORAL FLD

## 2024-04-14 LAB — DRUG TOX ALC METAB W/CON, ORAL FLD: Alcohol Metabolite: NEGATIVE ng/mL (ref <25)

## 2024-04-18 ENCOUNTER — Telehealth: Payer: Self-pay | Admitting: Registered Nurse

## 2024-04-18 ENCOUNTER — Ambulatory Visit
Admission: RE | Admit: 2024-04-18 | Discharge: 2024-04-18 | Disposition: A | Discharge status: Home / Self Care | Source: Ambulatory Visit | Attending: Registered Nurse | Admitting: Registered Nurse

## 2024-04-18 DIAGNOSIS — M47814 Spondylosis without myelopathy or radiculopathy, thoracic region: Secondary | ICD-10-CM | POA: Diagnosis not present

## 2024-04-18 NOTE — Telephone Encounter (Signed)
 Dr Babs,  Robin Morgan is reporting increase intensity and frequency of lower back pain, she reports she is only able to stand for 10 minutes, before pain becomes excruciating.  Her last Lumbar X-ray from 2024, the radiologist recommended MRI.  Are you in agreement with me ordering the MRI of her Lumbar.  I will await your response.

## 2024-04-19 ENCOUNTER — Ambulatory Visit (INDEPENDENT_AMBULATORY_CARE_PROVIDER_SITE_OTHER)

## 2024-04-19 VITALS — Ht 61.0 in | Wt 129.0 lb

## 2024-04-19 DIAGNOSIS — Z0000 Encounter for general adult medical examination without abnormal findings: Secondary | ICD-10-CM | POA: Diagnosis not present

## 2024-04-19 NOTE — Patient Instructions (Signed)
 Robin Morgan,  Thank you for taking the time for your Medicare Wellness Visit. I appreciate your continued commitment to your health goals. Please review the care plan we discussed, and feel free to reach out if I can assist you further.  Medicare recommends these wellness visits once per year to help you and your care team stay ahead of potential health issues. These visits are designed to focus on prevention, allowing your provider to concentrate on managing your acute and chronic conditions during your regular appointments.  Please note that Annual Wellness Visits do not include a physical exam. Some assessments may be limited, especially if the visit was conducted virtually. If needed, we may recommend a separate in-person follow-up with your provider.  Ongoing Care Seeing your primary care provider every 3 to 6 months helps us  monitor your health and provide consistent, personalized care.   Referrals If a referral was made during today's visit and you haven't received any updates within two weeks, please contact the referred provider directly to check on the status.  Recommended Screenings:  Health Maintenance  Topic Date Due   DEXA scan (bone density measurement)  01/16/2023   Medicare Annual Wellness Visit  02/18/2024   Flu Shot  03/10/2024   COVID-19 Vaccine (4 - 2025-26 season) 04/10/2024   Hemoglobin A1C  04/26/2024   Mammogram  09/21/2024   Eye exam for diabetics  10/11/2024   Yearly kidney function blood test for diabetes  10/24/2024   Yearly kidney health urinalysis for diabetes  10/24/2024   Complete foot exam   10/24/2024   Colon Cancer Screening  11/19/2025   DTaP/Tdap/Td vaccine (3 - Td or Tdap) 09/28/2032   Pneumococcal Vaccine for age over 82  Completed   Hepatitis C Screening  Completed   HPV Vaccine  Aged Out   Meningitis B Vaccine  Aged Out   Zoster (Shingles) Vaccine  Discontinued       01/07/2022    1:48 PM  Advanced Directives  Does Patient Have a Medical  Advance Directive? Yes   Advance Care Planning is important because it: Ensures you receive medical care that aligns with your values, goals, and preferences. Provides guidance to your family and loved ones, reducing the emotional burden of decision-making during critical moments.  Vision: Annual vision screenings are recommended for early detection of glaucoma, cataracts, and diabetic retinopathy. These exams can also reveal signs of chronic conditions such as diabetes and high blood pressure.  Dental: Annual dental screenings help detect early signs of oral cancer, gum disease, and other conditions linked to overall health, including heart disease and diabetes.  Please see the attached documents for additional preventive care recommendations.

## 2024-04-19 NOTE — Progress Notes (Addendum)
 Subjective:   Robin Morgan is a 68 y.o. who presents for a Medicare Wellness preventive visit.  As a reminder, Annual Wellness Visits don't include a physical exam, and some assessments may be limited, especially if this visit is performed virtually. We may recommend an in-person follow-up visit with your provider if needed.  Visit Complete: Virtual I connected with  Avelina JONETTA Hint on 04/19/24 by a audio enabled telemedicine application and verified that I am speaking with the correct person using two identifiers.  Patient Location: Home  Provider Location: Home Office  I discussed the limitations of evaluation and management by telemedicine. The patient expressed understanding and agreed to proceed.  Vital Signs: Because this visit was a virtual/telehealth visit, some criteria may be missing or patient reported. Any vitals not documented were not able to be obtained and vitals that have been documented are patient reported.  VideoDeclined- This patient declined Librarian, academic. Therefore the visit was completed with audio only.  Persons Participating in Visit: Patient.  AWV Questionnaire: No: Patient Medicare AWV questionnaire was not completed prior to this visit.  Cardiac Risk Factors include: advanced age (>25men, >29 women);dyslipidemia;diabetes mellitus;hypertension     Objective:    Today's Vitals   04/19/24 1511 04/19/24 1512  Weight: 129 lb (58.5 kg)   Height: 5' 1 (1.549 m)   PainSc:  8    Body mass index is 24.37 kg/m.     04/19/2024    3:23 PM 01/07/2022    1:48 PM 12/06/2020    2:13 PM 02/07/2020    1:24 PM 06/03/2017    9:56 AM 04/29/2017    9:44 AM 04/08/2017   11:24 AM  Advanced Directives  Does Patient Have a Medical Advance Directive? Yes Yes Yes No No  No  No   Type of Estate agent of [place];Living will  Living will      Copy of Healthcare Power of Attorney in Chart? No - copy requested         Would patient like information on creating a medical advance directive?    Yes (MAU/Ambulatory/Procedural Areas - Information given) No - Patient declined        Data saved with a previous flowsheet row definition    Current Medications (verified) Outpatient Encounter Medications as of 04/19/2024  Medication Sig   atorvastatin  (LIPITOR) 40 MG tablet TAKE ONE TABLET BY MOUTH ONCE A DAY   Calcium  Carbonate-Vitamin D  (CALCIUM -VITAMIN D ) 500-200 MG-UNIT per tablet Take 1 tablet by mouth 2 (two) times daily with a meal.   carboxymethylcellul-glycerin (OPTIVE) 0.5-0.9 % ophthalmic solution Apply to eye.   Cholecalciferol (VITAMIN D3) 50 MCG (2000 UT) CAPS Take 2 capsules by mouth daily.   co-enzyme Q-10 30 MG capsule Take 30 mg by mouth at bedtime.   diclofenac  Sodium (VOLTAREN ) 1 % GEL    DULoxetine  (CYMBALTA ) 30 MG capsule TAKE THREE CAPSULES BY MOUTH DAILY   esomeprazole  (NEXIUM ) 40 MG capsule TAKE ONE CAPSULE BY MOUTH TWO TIMES DAILY BEFORE A MEAL MORNING AND EVENING   FARXIGA  10 MG TABS tablet TAKE ONE TABLET (10 MG TOTAL) BY MOUTH DAILY.   fenofibrate  160 MG tablet TAKE ONE TABLET BY MOUTH ONCE A DAY   gabapentin  (NEURONTIN ) 300 MG capsule TAKE TWO CAPSULES BY MOUTH IN THE MORNING, TWO CAPSULES AT LUNCH AND TWO CAPSULES AT BEDTIME.   Glucose Blood (RELION GLUCOSE TEST STRIPS VI) by In Vitro route.   levothyroxine  (SYNTHROID ) 50 MCG tablet  levothyroxine  (SYNTHROID ) 88 MCG tablet TAKE ONE TAB BY MOUTH ONCE DAILY. TAKE ON AN EMPTY STOMACH WITH A GLASS OF WATER ATLEAST 30-60 MINUTES BEFORE BREAKFAST   lisinopril  (ZESTRIL ) 5 MG tablet 0.5 tablets (2.5 mg total).   metFORMIN (GLUCOPHAGE) 1000 MG tablet TAKE ONE TABLET BY MOUTH TWICE A DAY WITH MEALS   morphine  (MSIR) 15 MG tablet Take 1 tablet (15 mg total) by mouth 3 (three) times daily as needed for moderate pain.   Multiple Vitamins-Minerals (CENTRUM SILVER 50+WOMEN PO) Take by mouth.   naloxegol  oxalate (MOVANTIK ) 12.5 MG TABS tablet Take  1 tablet (12.5 mg total) by mouth daily.   RESTASIS 0.05 % ophthalmic emulsion Place 1 drop into both eyes daily.   topiramate  (TOPAMAX ) 100 MG tablet TAKE ONE TABLET BY MOUTH EVERY NIGHT AT BEDTIME   TRESIBA  FLEXTOUCH 200 UNIT/ML FlexTouch Pen INJECT 36 UNITS INTO THE SKIN TWO (TWO) TIMES DAILY.   vitamin B-12 (CYANOCOBALAMIN ) 1000 MCG tablet Take 1,000 mcg by mouth daily.   zolpidem  (AMBIEN ) 10 MG tablet TAKE ONE TABLET BY MOUTH EVERY NIGHT AT BEDTIME AS NEEDED FOR SLEEP   Facility-Administered Encounter Medications as of 04/19/2024  Medication   [START ON 04/27/2024] denosumab  (PROLIA ) injection 60 mg   [START ON 10/11/2024] denosumab  (PROLIA ) injection 60 mg    Allergies (verified) Compazine, Prochlorperazine, Amoxicillin , Emetrol, Other, Prochlorperazine edisylate, Prochlorperazine maleate, and Naproxen   History: Past Medical History:  Diagnosis Date   Calcific tendonitis    Cervical spondylosis without myelopathy    Depression    Diabetes mellitus    Esophageal stricture    Fibromyalgia    GERD (gastroesophageal reflux disease)    Hiatal hernia    Hyperlipidemia    Hypertension    Past Surgical History:  Procedure Laterality Date   ABDOMINAL HYSTERECTOMY     1986   APPENDECTOMY     1983   BREAST EXCISIONAL BIOPSY Right 1990's   BREAST SURGERY     rt breast cyst done in the 90's   CHOLECYSTECTOMY     GALLBLADDER SURGERY     1983   ROTATOR CUFF REPAIR     right   SPINE SURGERY     TONSILLECTOMY     1973   Family History  Problem Relation Age of Onset   Alzheimer's disease Mother    Diabetes Father    Heart disease Maternal Grandmother    Breast cancer Paternal Grandmother 21   Social History   Socioeconomic History   Marital status: Divorced    Spouse name: Not on file   Number of children: 1   Years of education: Not on file   Highest education level: Not on file  Occupational History   Occupation: disabled  Tobacco Use   Smoking status: Never    Smokeless tobacco: Never  Vaping Use   Vaping status: Never Used  Substance and Sexual Activity   Alcohol  use: No   Drug use: No   Sexual activity: Not Currently  Other Topics Concern   Not on file  Social History Narrative   Not on file   Social Drivers of Health   Financial Resource Strain: Low Risk  (04/19/2024)   Overall Financial Resource Strain (CARDIA)    Difficulty of Paying Living Expenses: Not hard at all  Food Insecurity: No Food Insecurity (04/19/2024)   Hunger Vital Sign    Worried About Running Out of Food in the Last Year: Never true    Ran Out of Food in the  Last Year: Never true  Transportation Needs: No Transportation Needs (04/19/2024)   PRAPARE - Administrator, Civil Service (Medical): No    Lack of Transportation (Non-Medical): No  Physical Activity: Inactive (04/19/2024)   Exercise Vital Sign    Days of Exercise per Week: 0 days    Minutes of Exercise per Session: 0 min  Stress: No Stress Concern Present (04/19/2024)   [employer] of Occupational Health - Occupational Stress Questionnaire    Feeling of Stress: Only a little  Social Connections: Moderately Isolated (04/19/2024)   Social Connection and Isolation Panel    Frequency of Communication with Friends and Family: More than three times a week    Frequency of Social Gatherings with Friends and Family: More than three times a week    Attends Religious Services: 1 to 4 times per year    Active Member of [employer] or Organizations: No    Attends Engineer, structural: Never    Marital Status: Divorced    Tobacco Counseling Counseling given: Not Answered    Clinical Intake:  Pre-visit preparation completed: Yes  Pain : 0-10 Pain Score: 8  Pain Type: Chronic pain Pain Location: Back Pain Descriptors / Indicators: Aching, Sore Pain Onset: More than a month ago Pain Frequency: Constant     BMI - recorded: 24.37 Nutritional Status: BMI of 19-24  Normal Diabetes:  Yes CBG done?: No Did pt. bring in CBG monitor from home?: No  Lab Results  Component Value Date   HGBA1C 6.6 (A) 10/25/2023   HGBA1C 5.9 (A) 04/22/2023   HGBA1C 6.9 (H) 09/28/2022     How often do you need to have someone help you when you read instructions, pamphlets, or other written materials from your doctor or pharmacy?: 1 - Never  Interpreter Needed?: No  Information entered by :: Ellouise Haws, LPN   Activities of Daily Living     04/19/2024    3:15 PM  In your present state of health, do you have any difficulty performing the following activities:  Hearing? 0  Vision? 0  Difficulty concentrating or making decisions? 0  Walking or climbing stairs? 0  Dressing or bathing? 0  Doing errands, shopping? 0  Preparing Food and eating ? N  Using the Toilet? N  In the past six months, have you accidently leaked urine? N  Do you have problems with loss of bowel control? N  Managing your Medications? N  Managing your Finances? N  Housekeeping or managing your Housekeeping? N    Patient Care Team: Jodie Lavern CROME, MD as PCP - General (Family Medicine) Shari Easter, MD as Consulting Physician (Orthopedic Surgery) Babs Arthea DASEN, MD as Consulting Physician (Physical Medicine and Rehabilitation) Benay Kay, PA-C as Physician Assistant (Otolaryngology) Dohmeier, Dedra, MD as Consulting Physician (Neurology) Burnette Fallow, MD as Consulting Physician (Gastroenterology) Lelon Lonni BIRCH, MD as Consulting Physician (Ophthalmology) Nicholaus Sherlean CROME, [facility] (Inactive) as Pharmacist (Pharmacist) Cristy Bonner DASEN, MD as Consulting Physician (Orthopedic Surgery)  I have updated your Care Teams any recent Medical Services you may have received from other providers in the past year.     Assessment:   This is a routine wellness examination for Dhara.  Hearing/Vision screen Hearing Screening - Comments:: Pt denies any hearing issues  Vision Screening - Comments::  Wears rx glasses - up to date with routine eye exams with [facility]    Goals Addressed  This Visit's Progress    Patient Stated       Be able to be more active       Depression Screen     04/19/2024    3:19 PM 04/12/2024   11:29 AM 02/03/2024    1:44 PM 12/02/2023    1:27 PM 10/06/2023    2:40 PM 09/24/2023    1:33 PM 08/25/2023    3:11 PM  PHQ 2/9 Scores  PHQ - 2 Score 1 2 2 6  0 0 2  PHQ- 9 Score     0      Fall Risk     04/19/2024    3:22 PM 04/12/2024   11:29 AM 02/03/2024    1:43 PM 12/02/2023    1:27 PM 10/06/2023    2:39 PM  Fall Risk   Falls in the past year? 0 0 0 0 0  Number falls in past yr: 0 0 0  0  Injury with Fall? 0 0 0  0  Risk for fall due to : Impaired mobility;Impaired balance/gait    No Fall Risks  Follow up Falls prevention discussed    Falls evaluation completed    MEDICARE RISK AT HOME:  Medicare Risk at Home Any stairs in or around the home?: No If so, are there any without handrails?: No Home free of loose throw rugs in walkways, pet beds, electrical cords, etc?: Yes Adequate lighting in your home to reduce risk of falls?: Yes Life alert?: Yes Use of a cane, walker or w/c?: No Grab bars in the bathroom?: Yes Shower chair or bench in shower?: No Elevated toilet seat or a handicapped toilet?: No  TIMED UP AND GO:  Was the test performed?  No  Cognitive Function: 6CIT completed    05/26/2018   10:57 AM  MMSE - Mini Mental State Exam  Orientation to time 5  Orientation to Place 5  Registration 3  Attention/ Calculation 5  Recall 2  Language- name 2 objects 2  Language- repeat 1  Language- follow 3 step command 3  Language- read & follow direction 1  Write a sentence 1  Copy design 1  Total score 29        04/19/2024    3:23 PM 12/06/2020    2:17 PM 12/01/2019   11:17 AM  6CIT Screen  What Year? 0 points 0 points 0 points  What month? 0 points 0 points 0 points  What time? 0 points  0 points  Count back from 20 0  points 0 points 0 points  Months in reverse 0 points 4 points 0 points  Repeat phrase 0 points 6 points 0 points  Total Score 0 points  0 points    Immunizations Immunization History  Administered Date(s) Administered   Fluad Quad(high Dose 65+) 04/06/2019, 05/09/2020, 09/01/2021, 09/28/2022   INFLUENZA, HIGH DOSE SEASONAL PF 05/13/2020   Influenza Split 06/04/2009, 05/02/2012   Influenza, Quadrivalent, Recombinant, Inj, Pf 05/12/2016, 07/07/2017   Influenza, Seasonal, Injecte, Preservative Fre 06/06/2014, 04/24/2015   Influenza,inj,Quad PF,6+ Mos 05/12/2016, 07/07/2017, 05/05/2018   Influenza-Unspecified 09/03/2011, 04/19/2013, 05/05/2018   PFIZER(Purple Top)SARS-COV-2 Vaccination 10/03/2019, 10/24/2019, 06/28/2020   Pneumococcal Conjugate,unspecified 12/01/2019   Pneumococcal Conjugate-13 12/01/2019   Pneumococcal Polysaccharide-23 08/29/2009, 10/19/2018   Tdap 05/11/2012, 09/28/2022    Screening Tests Health Maintenance  Topic Date Due   DEXA SCAN  01/16/2023   Influenza Vaccine  03/10/2024   COVID-19 Vaccine (4 - 2025-26 season) 04/10/2024   HEMOGLOBIN A1C  04/26/2024  MAMMOGRAM  09/21/2024   OPHTHALMOLOGY EXAM  10/11/2024   Diabetic kidney evaluation - eGFR measurement  10/24/2024   Diabetic kidney evaluation - Urine ACR  10/24/2024   FOOT EXAM  10/24/2024   Medicare Annual Wellness (AWV)  04/19/2025   Colonoscopy  11/19/2025   DTaP/Tdap/Td (3 - Td or Tdap) 09/28/2032   Pneumococcal Vaccine: 50+ Years  Completed   Hepatitis C Screening  Completed   HPV VACCINES  Aged Out   Meningococcal B Vaccine  Aged Out   Zoster Vaccines- Shingrix   Discontinued    Health Maintenance Items Addressed: See Nurse Notes at the end of this note  Additional Screening:  Vision Screening: Recommended annual ophthalmology exams for early detection of glaucoma and other disorders of the eye. Is the patient up to date with their annual eye exam?  Yes  Who is the provider or what is  the name of the office in which the patient attends annual eye exams? [facility]  Dental Screening: Recommended annual dental exams for proper oral hygiene  Community Resource Referral / Chronic Care Management: CRR required this visit?  No   CCM required this visit?  No   Plan:    I have personally reviewed and noted the following in the patient's chart:   Medical and social history Use of alcohol , tobacco or illicit drugs  Current medications and supplements including opioid prescriptions. Patient is currently taking opioid prescriptions. Information provided to patient regarding non-opioid alternatives. Patient advised to discuss non-opioid treatment plan with their provider. Functional ability and status Nutritional status Physical activity Advanced directives List of other physicians Hospitalizations, surgeries, and ER visits in previous 12 months Vitals Screenings to include cognitive, depression, and falls Referrals and appointments  In addition, I have reviewed and discussed with patient certain preventive protocols, quality metrics, and best practice recommendations. A written personalized care plan for preventive services as well as general preventive health recommendations were provided to patient.   Ellouise VEAR Haws, LPN   [id number]   After Visit Summary: (MyChart) Due to this being a telephonic visit, the after visit summary with patients personalized plan was offered to patient via MyChart   Notes: Nothing significant to report at this time.

## 2024-04-20 ENCOUNTER — Other Ambulatory Visit: Payer: Self-pay | Admitting: Family Medicine

## 2024-04-24 ENCOUNTER — Other Ambulatory Visit: Payer: Self-pay

## 2024-04-26 NOTE — Therapy (Signed)
 OUTPATIENT PHYSICAL THERAPY THORACOLUMBAR EVALUATION   Patient Name: Robin Morgan MRN: [id number] DOB:1956/05/04, 68 y.o., female Today's Date: 04/28/2024  END OF SESSION:  PT End of Session - 04/28/24 1104     Visit Number 1    Number of Visits 24    Date for Recertification  07/20/24    Progress Note Due on Visit 10    PT Start Time 1015    PT Stop Time 1100    PT Time Calculation (min) 45 min    Activity Tolerance Patient limited by pain    Behavior During Therapy WFL for tasks assessed/performed          Past Medical History:  Diagnosis Date   Calcific tendonitis    Cervical spondylosis without myelopathy    Depression    Diabetes mellitus    Esophageal stricture    Fibromyalgia    GERD (gastroesophageal reflux disease)    Hiatal hernia    Hyperlipidemia    Hypertension    Past Surgical History:  Procedure Laterality Date   ABDOMINAL HYSTERECTOMY     1986   APPENDECTOMY     1983   BREAST EXCISIONAL BIOPSY Right 1990's   BREAST SURGERY     rt breast cyst done in the 90's   CHOLECYSTECTOMY     GALLBLADDER SURGERY     1983   ROTATOR CUFF REPAIR     right   SPINE SURGERY     TONSILLECTOMY     1973   Patient Active Problem List   Diagnosis Date Noted   Traumatic tear of left rotator cuff 09/28/2022   Vitamin D  deficiency 09/28/2022   Vitamin B12 deficiency 09/28/2022   Urinary urgency 09/28/2022   Rotator cuff syndrome, left 12/11/2020   Migraine without aura, intractable, without status migrainosus 07/31/2020   Cervical disc disorder with radiculopathy of cervical region 07/31/2020   Biceps tendinitis, right 02/07/2020   Mid back pain 12/06/2019   Insomnia due to medical condition 12/01/2019   Chronic prescription opiate use 09/02/2018   Esophageal stricture    Tenosynovitis, wrist 10/07/2017   Trigger thumb of left hand 10/07/2017   Laryngopharyngeal reflux (LPR) 08/26/2017   Dysphagia 08/26/2017   Left carpal tunnel syndrome 03/08/2017    Diabetic peripheral neuropathy associated with type 2 diabetes mellitus (HCC) 01/06/2017   OSA (obstructive sleep apnea) 03/10/2016   Fibromyalgia 02/12/2016   Controlled diabetes mellitus with diabetic neuropathy, with long-term current use of insulin  (HCC) 10/11/2014   Hypertension associated with diabetes (HCC) 10/11/2014   Combined hyperlipidemia associated with type 2 diabetes mellitus (HCC) 10/11/2014   DDD (degenerative disc disease), lumbar 01/15/2014   Hypothyroidism, acquired 06/12/2013   Cervical post-laminectomy syndrome 05/10/2013   Osteoporosis 12/01/2012   Chronic diarrhea 07/04/2012   Myofascial muscle pain 10/28/2011   Depression, recurrent (HCC) 10/28/2011   Chronic pain associated with significant psychosocial dysfunction 01/03/2010    PCP: Dr. Lavern Heck  REFERRING PROVIDER: Fidela Ned, NP  REFERRING DIAG:  G89.4 (ICD-10-CM) - Chronic pain syndrome  M54.6,G89.29 (ICD-10-CM) - Chronic bilateral thoracic back pain  M54.16 (ICD-10-CM) - Right lumbar radiculitis    Rationale for Evaluation and Treatment: Rehabilitation  THERAPY DIAG:  Chronic right-sided low back pain with right-sided sciatica  Pain in thoracic spine  Muscle weakness (generalized)  Other abnormalities of gait and mobility  ONSET DATE: Over 20 years but just progressively  SUBJECTIVE:  SUBJECTIVE STATEMENT: I am here because I am struggling with leg weakness. When I squat I can't get up. Of course I have chronic pain including my neck, mid back, low back and hips. Been dealing with all this for a long time but sure would like to gain some strength if possible.   PERTINENT HISTORY:  Patient was referred per office visit on 04/12/2024-  with known myofascial pain with fibromyalgia and chronic mid/low back  apin with lumbar spondylosis and radiculopathy. PMH also including: Rotator cuff syndrome, Cervicalgia, CTS, Depression, OA of right hand, Migraines, Left shoulder pain. Hx of falls- but none recent  PAIN:  Are you having pain? Yes: NPRS scale: Mid back - standing in one place and walking with my low back- can't lay on either side; neck pain as well. Rating mid back- current= 8/10; worst = 9/10; best= 7/10; low back = current= 6/10; worst= 9/10; best= 6/10 (laying down)  Pain location: Mid back, low back, and cervical - pain starts on my right side - cramping at times and runs down my right leg Pain description: constant ache strong pain varies in intensity Aggravating factors: prolonged standing and walking Relieving factors: medicine, rest, ice, laying down, try stretches  PRECAUTIONS: None  RED FLAGS: None   WEIGHT BEARING RESTRICTIONS: No  FALLS:  Has patient fallen in last 6 months? No  LIVING ENVIRONMENT: Lives with: lives alone Lives in: House/apartment Stairs: flat entry Has following equipment at home: Grab bars  OCCUPATION: retired- mostly sedentary due to pain-  PLOF: Independent  PATIENT GOALS: Strength to get better.   NEXT MD VISIT: next week -Week of 9/22  OBJECTIVE:  Note: Objective measures were completed at Evaluation unless otherwise noted.  DIAGNOSTIC FINDINGS:  CLINICAL DATA:  Upper back pain   EXAM: THORACIC SPINE - 3 VIEWS   COMPARISON:  04/06/2023   FINDINGS: Vertebral body height is well maintained. Multilevel osteophytic changes are noted. No paraspinal mass is noted. Soft tissue abnormality is noted.   IMPRESSION: Degenerative change without acute abnormality.     Electronically Signed   By: Oneil Devonshire M.D.   On: 04/26/2024 02:36  PATIENT SURVEYS:  Modified Oswestry:  MODIFIED OSWESTRY DISABILITY SCALE  Date: 04/27/2024 Score  Pain intensity 3 =  Pain medication provides me with moderate relief from pain.  2. Personal care  (washing, dressing, etc.) 1 =  I can take care of myself normally, but it increases my pain.  3. Lifting 3 = Pain prevents me from lifting heavy weights, but I can manage (5) I have hardly any social life because of my pain. light to medium weights if they are conveniently positioned  4. Walking 3 =  Pain prevents me from walking more than  mile.  5. Sitting 2 =  Pain prevents me from sitting more than 1 hour.  6. Standing 3 =  Pain prevents me from standing more than 1/2 hour.  7. Sleeping 2 =  Even when I take pain medication, I sleep less than 6 hours  8. Social Life 1 =  My social life is normal, but it increases my level of pain.  9. Traveling 2 =  My pain restricts my travel over 2 hours.  10. Employment/ Homemaking 3 = Pain prevents me from doing anything but light duties.  Total 23/50 or 46% (MOD)   Interpretation of scores: Score Category Description  0-20% Minimal Disability The patient can cope with most living activities. Usually no treatment is indicated apart  from advice on lifting, sitting and exercise  21-40% Moderate Disability The patient experiences more pain and difficulty with sitting, lifting and standing. Travel and social life are more difficult and they may be disabled from work. Personal care, sexual activity and sleeping are not grossly affected, and the patient can usually be managed by conservative means  41-60% Severe Disability Pain remains the main problem in this group, but activities of daily living are affected. These patients require a detailed investigation  61-80% Crippled Back pain impinges on all aspects of the patient's life. Positive intervention is required  81-100% Bed-bound  These patients are either bed-bound or exaggerating their symptoms  Bluford FORBES Zoe DELENA Karon DELENA, et al. Surgery versus conservative management of stable thoracolumbar fracture: the PRESTO feasibility RCT. [place] ([place]): [employer]; 2021 Nov. [facility] [employer]  Assessment, No. 25.62.) Appendix 3, Oswestry Disability Index category descriptors. Available from: [employer]  Minimally Clinically Important Difference (MCID) = 12.8%  COGNITION: Overall cognitive status: Within functional limits for tasks assessed     SENSATION: Light touch: Impaired - lateral thighs    POSTURE: rounded shoulders, forward head, and decreased lumbar lordosis  PALPATION: (+) tenderness mostly along right gluteal region today  LUMBAR ROM:   AROM eval  Flexion Fingertips to ankles  Extension WNL  Right lateral flexion Fingertips  1.5 in   Left lateral flexion Fingertips 2 in  Right rotation   Left rotation    (Blank rows = not tested)  LOWER EXTREMITY MMT    Active  Right eval Left eval  Hip flexion 4 4  Hip extension    Hip abduction 4 4  Hip adduction    Hip internal rotation 4 4  Hip external rotation 4 4  Knee flexion 4 4  Knee extension 4 4  Ankle dorsiflexion 4 4  Ankle plantarflexion    Ankle inversion    Ankle eversion     (Blank rows = not tested)   LUMBAR SPECIAL TESTS:  Straight leg raise test: Positive and Slump test: Positive  FUNCTIONAL TESTS:  5 times sit to stand: 19.38 without UE support *painful back 6 minute walk test: To be assessed visit 2 10 meter walk test: To be assessed visit 2  GAIT: Distance walked: 80 feet approx Assistive device utilized: None Level of assistance: Complete Independence Comments: decreased step length  TREATMENT DATE: 04/27/2024  PT EVALUATION                                                                                                                                  PATIENT EDUCATION:  Education details: Purpose of PT and how can assist with chronic back pain management; Proposed Plan of care; anatomy of spine Person educated: Patient Education method: Explanation, Demonstration, Tactile cues, and Verbal cues Education comprehension: verbalized  understanding, returned demonstration, verbal cues required, tactile cues required, and needs further education  HOME EXERCISE PROGRAM: To be initiated  visit 2  ASSESSMENT:  CLINICAL IMPRESSION: Patient is a 68 y.o. female who was seen today for physical therapy evaluation and treatment for chronic pain syndrome and mid/low back pain with radiculopathy. Patient reports also having cervical pain and presents with R sided low back pain with some radiculopathy. Exam concentrated on low back symptoms and reveals deficits . Pt presents with deficits in BLE strength, impaired mobility- guarded with decreased step length, range of motion, and pain. Pt will benefit from skilled PT services to address deficits and return to pain-free function at home and work.   OBJECTIVE IMPAIRMENTS: Abnormal gait, decreased activity tolerance, decreased balance, decreased mobility, difficulty walking, decreased ROM, decreased strength, hypomobility, increased fascial restrictions, impaired flexibility, and pain.   ACTIVITY LIMITATIONS: carrying, lifting, bending, sitting, standing, squatting, stairs, and transfers  PARTICIPATION LIMITATIONS: meal prep, cleaning, laundry, shopping, community activity, and yard work  PERSONAL FACTORS: Time since onset of injury/illness/exacerbation and 3+ comorbidities: HTN, DM, Fibromyalgia, Spondylosis are also affecting patient's functional outcome.   REHAB POTENTIAL: Good  CLINICAL DECISION MAKING: Evolving/moderate complexity  EVALUATION COMPLEXITY: Moderate   GOALS: Goals reviewed with patient? Yes  SHORT TERM GOALS: Target date: 06/08/2024  Pt will be independent with HEP in order to improve strength and decrease back pain in order to improve pain-free function at home Baseline: EVAL- No formal HEP in place Goal status: INITIAL   LONG TERM GOALS: Target date: 07/19/2024  Pt will decrease mODI score by at least 13 points in order demonstrate clinically significant  reduction in back pain/disability.  Baseline: EVAL = 46% Goal status: INITIAL  2.  Pt will decrease worst back pain as reported on NPRS by at least 2 points in order to demonstrate clinically significant reduction in back pain.  Baseline:  EVAL:  Mid back - standing in one place and walking with my low back- can't lay on either side; neck pain as well. Rating mid back- current= 8/10; worst = 9/10; best= 7/10; low back = current= 6/10; worst= 9/10; best= 6/10 (laying down)  Pain location: Mid back, low back, and cervical - pain starts on my right side - cramping at times and runs down my right leg Pain description: constant ache strong pain varies in intensity Aggravating factors: prolonged standing and walking Relieving factors: medicine, rest, ice, laying down, try stretches Goal status: INITIAL  3.  Pt will decrease 5TSTS by at least 3 seconds in order to demonstrate clinically significant improvement in LE strength. Baseline:  EVAL = 19.38 sec without UE Support Goal status: INITIAL  4.  Pt will increase by at least 6m (122ft) in order to demonstrate clinically significant improvement in cardiopulmonary endurance and community ambulation  Baseline: To be assessed visit #2 Goal status: INITIAL  5.  Patient will increase 10 meter walk test to >1.42m/s as to improve gait speed for better community ambulation and to reduce fall risk. Baseline: To be assessed visit 2 Goal status: INITIAL   PLAN:  PT FREQUENCY: 1-2x/week  PT DURATION: 12 weeks  PLANNED INTERVENTIONS: 97164- PT Re-evaluation, 97750- Physical Performance Testing, 97110-Therapeutic exercises, 97530- Therapeutic activity, [id number]- Neuromuscular re-education, 97535- Self Care, [id number]- Manual therapy, [id number]- Gait training, [id number]- Orthotic Initial, [phone]- Orthotic/Prosthetic subsequent, [phone]- Electrical stimulation (manual), [id number]- Traction (mechanical), [phone] (1-2 muscles), [id number] (3+ muscles)- Dry Needling, Patient/Family  education, Balance training, Stair training, Taping, Joint mobilization, Joint manipulation, Spinal manipulation, Spinal mobilization, Scar mobilization, Compression bandaging, Vestibular training, DME instructions, Cryotherapy, and Moist heat.  PLAN FOR NEXT SESSION:  6 min walk 10 MWT Manual therapy for ROM/pain relief NMR for postural awareness Body mechanics assessment and add goal if needed Add to HEP next visit   Reyes LOISE London, PT Physical Therapist [place] [facility] [phone] 04/28/2024, 11:15 AM

## 2024-04-27 ENCOUNTER — Ambulatory Visit: Attending: Registered Nurse

## 2024-04-27 DIAGNOSIS — R2689 Other abnormalities of gait and mobility: Secondary | ICD-10-CM | POA: Diagnosis not present

## 2024-04-27 DIAGNOSIS — W19XXXA Unspecified fall, initial encounter: Secondary | ICD-10-CM | POA: Insufficient documentation

## 2024-04-27 DIAGNOSIS — G894 Chronic pain syndrome: Secondary | ICD-10-CM | POA: Insufficient documentation

## 2024-04-27 DIAGNOSIS — M5416 Radiculopathy, lumbar region: Secondary | ICD-10-CM | POA: Insufficient documentation

## 2024-04-27 DIAGNOSIS — M546 Pain in thoracic spine: Secondary | ICD-10-CM | POA: Insufficient documentation

## 2024-04-27 DIAGNOSIS — G8929 Other chronic pain: Secondary | ICD-10-CM | POA: Diagnosis present

## 2024-04-27 DIAGNOSIS — M5441 Lumbago with sciatica, right side: Secondary | ICD-10-CM | POA: Diagnosis not present

## 2024-04-27 DIAGNOSIS — M6281 Muscle weakness (generalized): Secondary | ICD-10-CM | POA: Diagnosis not present

## 2024-04-28 ENCOUNTER — Ambulatory Visit: Payer: Self-pay

## 2024-04-28 NOTE — Telephone Encounter (Signed)
 Noted

## 2024-04-28 NOTE — Telephone Encounter (Signed)
 FYI Only or Action Required?: FYI only for provider.  Patient was last seen in primary care on 10/25/2023 by Jodie Lavern CROME, MD.  Called Nurse Triage reporting Dysuria.  Symptoms began several days ago.  Interventions attempted: OTC medications: generic AZO.  Symptoms are: gradually worsening.  Triage Disposition: See Physician Within 24 Hours  Patient/caregiver understands and will follow disposition?: Yes      Copied from CRM [id number]. Topic: Clinical - Red Word Triage >> Apr 28, 2024 11:30 AM Jasmin G wrote: [facility] that prompted transfer to Nurse Triage: Possible UTI that started 2 days ago, pt experiencing pain while urinating. Pt initially called to see if this could be seen by PCP on her next visit on Sep 24th.       Reason for Disposition  Age > 50 years  Answer Assessment - Initial Assessment Questions 1. SEVERITY: How bad is the pain?  (e.g., Scale 1-10; mild, moderate, or severe)     Mild to moderate   2. FREQUENCY: How many times have you had painful urination today?      Once so far today  3. PATTERN: Is pain present every time you urinate or just sometimes?      Just about every time 4. ONSET: When did the painful urination start?      Several days ago 5. FEVER: Do you have a fever? If Yes, ask: What is your temperature, how was it measured, and when did it start?     No 6. PAST UTI: Have you had a urine infection before? If Yes, ask: When was the last time? and What happened that time?      Not for years  7. CAUSE: What do you think is causing the painful urination?  (e.g., UTI, scratch, Herpes sore)     Possible UTI 8. OTHER SYMPTOMS: Do you have any other symptoms? (e.g., blood in urine, flank pain, genital sores, urgency, vaginal discharge)     Increased urgency with incontinence  Protocols used: Urination Pain - Female-A-AH

## 2024-05-01 ENCOUNTER — Ambulatory Visit

## 2024-05-01 DIAGNOSIS — G894 Chronic pain syndrome: Secondary | ICD-10-CM | POA: Diagnosis not present

## 2024-05-01 DIAGNOSIS — M5441 Lumbago with sciatica, right side: Secondary | ICD-10-CM | POA: Diagnosis not present

## 2024-05-01 DIAGNOSIS — R2689 Other abnormalities of gait and mobility: Secondary | ICD-10-CM | POA: Diagnosis not present

## 2024-05-01 DIAGNOSIS — M546 Pain in thoracic spine: Secondary | ICD-10-CM | POA: Diagnosis not present

## 2024-05-01 DIAGNOSIS — M6281 Muscle weakness (generalized): Secondary | ICD-10-CM | POA: Diagnosis not present

## 2024-05-01 DIAGNOSIS — M5416 Radiculopathy, lumbar region: Secondary | ICD-10-CM | POA: Diagnosis not present

## 2024-05-01 DIAGNOSIS — G8929 Other chronic pain: Secondary | ICD-10-CM

## 2024-05-01 NOTE — Therapy (Signed)
 OUTPATIENT PHYSICAL THERAPY THORACOLUMBAR TREATMENT   Patient Name: Robin Morgan MRN: [id number] DOB:Nov 23, 1955, 68 y.o., female Today's Date: 05/01/2024  END OF SESSION:  PT End of Session - 05/01/24 1021     Visit Number 2    Number of Visits 24    Date for Recertification  07/20/24    Progress Note Due on Visit 10    PT Start Time 1017    PT Stop Time 1102    PT Time Calculation (min) 45 min    Activity Tolerance Patient tolerated treatment well    Behavior During Therapy WFL for tasks assessed/performed           Past Medical History:  Diagnosis Date   Calcific tendonitis    Cervical spondylosis without myelopathy    Depression    Diabetes mellitus    Esophageal stricture    Fibromyalgia    GERD (gastroesophageal reflux disease)    Hiatal hernia    Hyperlipidemia    Hypertension    Past Surgical History:  Procedure Laterality Date   ABDOMINAL HYSTERECTOMY     1986   APPENDECTOMY     1983   BREAST EXCISIONAL BIOPSY Right 1990's   BREAST SURGERY     rt breast cyst done in the 90's   CHOLECYSTECTOMY     GALLBLADDER SURGERY     1983   ROTATOR CUFF REPAIR     right   SPINE SURGERY     TONSILLECTOMY     1973   Patient Active Problem List   Diagnosis Date Noted   Traumatic tear of left rotator cuff 09/28/2022   Vitamin D  deficiency 09/28/2022   Vitamin B12 deficiency 09/28/2022   Urinary urgency 09/28/2022   Rotator cuff syndrome, left 12/11/2020   Migraine without aura, intractable, without status migrainosus 07/31/2020   Cervical disc disorder with radiculopathy of cervical region 07/31/2020   Biceps tendinitis, right 02/07/2020   Mid back pain 12/06/2019   Insomnia due to medical condition 12/01/2019   Chronic prescription opiate use 09/02/2018   Esophageal stricture    Tenosynovitis, wrist 10/07/2017   Trigger thumb of left hand 10/07/2017   Laryngopharyngeal reflux (LPR) 08/26/2017   Dysphagia 08/26/2017   Left carpal tunnel syndrome  03/08/2017   Diabetic peripheral neuropathy associated with type 2 diabetes mellitus (HCC) 01/06/2017   OSA (obstructive sleep apnea) 03/10/2016   Fibromyalgia 02/12/2016   Controlled diabetes mellitus with diabetic neuropathy, with long-term current use of insulin  (HCC) 10/11/2014   Hypertension associated with diabetes (HCC) 10/11/2014   Combined hyperlipidemia associated with type 2 diabetes mellitus (HCC) 10/11/2014   DDD (degenerative disc disease), lumbar 01/15/2014   Hypothyroidism, acquired 06/12/2013   Cervical post-laminectomy syndrome 05/10/2013   Osteoporosis 12/01/2012   Chronic diarrhea 07/04/2012   Myofascial muscle pain 10/28/2011   Depression, recurrent (HCC) 10/28/2011   Chronic pain associated with significant psychosocial dysfunction 01/03/2010    PCP: Dr. Lavern Heck  REFERRING PROVIDER: Fidela Ned, NP  REFERRING DIAG:  G89.4 (ICD-10-CM) - Chronic pain syndrome  M54.6,G89.29 (ICD-10-CM) - Chronic bilateral thoracic back pain  M54.16 (ICD-10-CM) - Right lumbar radiculitis    Rationale for Evaluation and Treatment: Rehabilitation  THERAPY DIAG:  Chronic right-sided low back pain with right-sided sciatica  Pain in thoracic spine  Muscle weakness (generalized)  Other abnormalities of gait and mobility  ONSET DATE: Over 20 years but just progressively  SUBJECTIVE:  SUBJECTIVE STATEMENT: From Today: Patient reports feeling SOB just walking in here from parking. Reports 8/10 LBP -Tailbone and BLE (more in left lateral thigh    From EVAL: I am here because I am struggling with leg weakness. When I squat I can't get up. Of course I have chronic pain including my neck, mid back, low back and hips. Been dealing with all this for a long time but sure would like to gain some  strength if possible.   PERTINENT HISTORY:  Patient was referred per office visit on 04/12/2024-  with known myofascial pain with fibromyalgia and chronic mid/low back apin with lumbar spondylosis and radiculopathy. PMH also including: Rotator cuff syndrome, Cervicalgia, CTS, Depression, OA of right hand, Migraines, Left shoulder pain. Hx of falls- but none recent  PAIN:  Are you having pain? Yes: NPRS scale: Mid back - standing in one place and walking with my low back- can't lay on either side; neck pain as well. Rating mid back- current= 8/10; worst = 9/10; best= 7/10; low back = current= 6/10; worst= 9/10; best= 6/10 (laying down)  Pain location: Mid back, low back, and cervical - pain starts on my right side - cramping at times and runs down my right leg Pain description: constant ache strong pain varies in intensity Aggravating factors: prolonged standing and walking Relieving factors: medicine, rest, ice, laying down, try stretches  PRECAUTIONS: None  RED FLAGS: None   WEIGHT BEARING RESTRICTIONS: No  FALLS:  Has patient fallen in last 6 months? No  LIVING ENVIRONMENT: Lives with: lives alone Lives in: House/apartment Stairs: flat entry Has following equipment at home: Grab bars  OCCUPATION: retired- mostly sedentary due to pain-  PLOF: Independent  PATIENT GOALS: Strength to get better.   NEXT MD VISIT: next week -Week of 9/22  OBJECTIVE:  Note: Objective measures were completed at Evaluation unless otherwise noted.  DIAGNOSTIC FINDINGS:  CLINICAL DATA:  Upper back pain   EXAM: THORACIC SPINE - 3 VIEWS   COMPARISON:  04/06/2023   FINDINGS: Vertebral body height is well maintained. Multilevel osteophytic changes are noted. No paraspinal mass is noted. Soft tissue abnormality is noted.   IMPRESSION: Degenerative change without acute abnormality.     Electronically Signed   By: Oneil Devonshire M.D.   On: 04/26/2024 02:36  PATIENT SURVEYS:  Modified  Oswestry:  MODIFIED OSWESTRY DISABILITY SCALE  Date: 04/27/2024 Score  Pain intensity 3 =  Pain medication provides me with moderate relief from pain.  2. Personal care (washing, dressing, etc.) 1 =  I can take care of myself normally, but it increases my pain.  3. Lifting 3 = Pain prevents me from lifting heavy weights, but I can manage (5) I have hardly any social life because of my pain. light to medium weights if they are conveniently positioned  4. Walking 3 =  Pain prevents me from walking more than  mile.  5. Sitting 2 =  Pain prevents me from sitting more than 1 hour.  6. Standing 3 =  Pain prevents me from standing more than 1/2 hour.  7. Sleeping 2 =  Even when I take pain medication, I sleep less than 6 hours  8. Social Life 1 =  My social life is normal, but it increases my level of pain.  9. Traveling 2 =  My pain restricts my travel over 2 hours.  10. Employment/ Homemaking 3 = Pain prevents me from doing anything but light duties.  Total 23/50 or  46% (MOD)   Interpretation of scores: Score Category Description  0-20% Minimal Disability The patient can cope with most living activities. Usually no treatment is indicated apart from advice on lifting, sitting and exercise  21-40% Moderate Disability The patient experiences more pain and difficulty with sitting, lifting and standing. Travel and social life are more difficult and they may be disabled from work. Personal care, sexual activity and sleeping are not grossly affected, and the patient can usually be managed by conservative means  41-60% Severe Disability Pain remains the main problem in this group, but activities of daily living are affected. These patients require a detailed investigation  61-80% Crippled Back pain impinges on all aspects of the patient's life. Positive intervention is required  81-100% Bed-bound  These patients are either bed-bound or exaggerating their symptoms  Bluford FORBES Zoe DELENA Karon DELENA, et al.  Surgery versus conservative management of stable thoracolumbar fracture: the PRESTO feasibility RCT. [place] ([place]): [employer]; 2021 Nov. [facility] Technology Assessment, No. 25.62.) Appendix 3, Oswestry Disability Index category descriptors. Available from: FindJewel[employer].cz  Minimally Clinically Important Difference (MCID) = 12.8%  COGNITION: Overall cognitive status: Within functional limits for tasks assessed     SENSATION: Light touch: Impaired - lateral thighs    POSTURE: rounded shoulders, forward head, and decreased lumbar lordosis  PALPATION: (+) tenderness mostly along right gluteal region today  LUMBAR ROM:   AROM eval  Flexion Fingertips to ankles  Extension WNL  Right lateral flexion Fingertips  1.5 in   Left lateral flexion Fingertips 2 in  Right rotation   Left rotation    (Blank rows = not tested)  LOWER EXTREMITY MMT    Active  Right eval Left eval  Hip flexion 4 4  Hip extension    Hip abduction 4 4  Hip adduction    Hip internal rotation 4 4  Hip external rotation 4 4  Knee flexion 4 4  Knee extension 4 4  Ankle dorsiflexion 4 4  Ankle plantarflexion    Ankle inversion    Ankle eversion     (Blank rows = not tested)   LUMBAR SPECIAL TESTS:  Straight leg raise test: Positive and Slump test: Positive  FUNCTIONAL TESTS:  5 times sit to stand: 19.38 without UE support *painful back 6 minute walk test: To be assessed visit 2 10 meter walk test: To be assessed visit 2  GAIT: Distance walked: 80 feet approx Assistive device utilized: None Level of assistance: Complete Independence Comments: decreased step length  TREATMENT DATE: 05/01/2024  10 Meter Walk Test: Patient instructed to walk 10 meters (32.8 ft) as quickly and as safely as possible at their normal speed x2 and at a fast speed x2. Time measured from 2 meter mark to 8 meter mark to accommodate ramp-up and ramp-down.  Normal speed 1: 0.84  m/s Normal speed 2: 0.84 m/s Average Normal speed: 0.84 m/s Cut off scores: <0.4 m/s = household Ambulator, 0.4-0.8 m/s = limited community Ambulator, >0.8 m/s = community Ambulator, >1.2 m/s = crossing a street, <1.0 = increased fall risk MCID 0.05 m/s (small), 0.13 m/s (moderate), 0.06 m/s (significant)  (ANPTA Core Set of Outcome Measures for Adults with Neurologic Conditions, 2018)  6 Min Walk Test:  Instructed patient to ambulate as quickly and as safely as possible for 6 minutes using LRAD. Patient was allowed to take standing rest breaks without stopping the test, but if the patient required a sitting rest break the clock would be stopped and the test would be over.  Results: 980 feet (299 meters, Avg speed 0.83 m/s) using no AD with SBA. Results indicate that the patient has reduced endurance with ambulation compared to age matched norms.  Age Matched Norms: 98-69 yo M: 87 F: 35, 84-79 yo M: 49 F: 471, 17-89 yo M: 417 F: 392 MDC: 58.21 meters (190.98 feet) or 50 meters (ANPTA Core Set of Outcome Measures for Adults with Neurologic Conditions, 2018)   Self care/home management:  Patient was instructed in some beginner low back stretching/ROM for pain management and ROM.   -Single knee to chest -Seated hamstring -Supine lower trunk rotation -Supine figure 4 stetch BLE  -Supine piriformis -HOLD each stretch x 30 sec x 4 each Side today.      PATIENT EDUCATION:  Education details: Purpose of functional outcome testing and introduction to HEP Person educated: Patient Education method: Explanation, Demonstration, Tactile cues, and Verbal cues Education comprehension: verbalized understanding, returned demonstration, verbal cues required, tactile cues required, and needs further education  HOME EXERCISE PROGRAM: Access Code: [email or web address] URL:  [email or web address] Date: 05/01/2024 Prepared by: Reyes London  Exercises - Supine Single Knee to Chest Stretch  - 1 x daily - 3 sets - 30 sec hold - Supine Lower Trunk Rotation  - 1 x daily - 3 sets - 30 sec hold - Supine Piriformis Stretch with Foot on Ground  - 1 x daily - 3 sets - 30 sec hold - Supine Figure 4 Piriformis Stretch  - 1 x daily - 7 x weekly - 3 sets - 10 reps - Supine Piriformis Stretch with Foot on Ground  - 1 x daily - 3 sets - 30 hold - Seated Hamstring Stretch  - 1 x daily - 3 sets - 30 sec hold  ASSESSMENT:  CLINICAL IMPRESSION: Patient is a 68 y.o. female who was seen today for physical therapy treatment for chronic pain syndrome and mid/low back pain with radiculopathy. Further assessment of mobility reveals deficits with decreased gait distance in 6 min walk test compared to age/sex norms. Patient also presented with decreased gait speed and step length. She responded favorably to some beginner Low back stretches- reporting feeling the stretch but no increased pain.  Pt will benefit from skilled PT services to address deficits and return to pain-free function at home and work.   OBJECTIVE IMPAIRMENTS: Abnormal gait, decreased activity tolerance, decreased balance, decreased mobility, difficulty walking, decreased ROM, decreased strength, hypomobility, increased fascial restrictions, impaired flexibility, and pain.   ACTIVITY LIMITATIONS: carrying, lifting, bending, sitting, standing, squatting, stairs, and transfers  PARTICIPATION LIMITATIONS: meal prep, cleaning, laundry, shopping, community activity, and yard work  PERSONAL FACTORS: Time since onset of injury/illness/exacerbation and 3+ comorbidities: HTN, DM, Fibromyalgia, Spondylosis are also affecting patient's functional outcome.   REHAB POTENTIAL: Good  CLINICAL DECISION MAKING: Evolving/moderate complexity  EVALUATION COMPLEXITY: Moderate   GOALS: Goals reviewed with patient?  Yes  SHORT TERM GOALS: Target date: 06/08/2024  Pt will be independent with HEP in order to improve strength and decrease back pain in order to improve pain-free function at home Baseline: EVAL- No formal HEP in place Goal status: INITIAL   LONG TERM GOALS: Target date: 07/19/2024  Pt will decrease mODI  score by at least 13 points in order demonstrate clinically significant reduction in back pain/disability.  Baseline: EVAL = 46% Goal status: INITIAL  2.  Pt will decrease worst back pain as reported on NPRS by at least 2 points in order to demonstrate clinically significant reduction in back pain.  Baseline:  EVAL:  Mid back - standing in one place and walking with my low back- can't lay on either side; neck pain as well. Rating mid back- current= 8/10; worst = 9/10; best= 7/10; low back = current= 6/10; worst= 9/10; best= 6/10 (laying down)  Pain location: Mid back, low back, and cervical - pain starts on my right side - cramping at times and runs down my right leg Pain description: constant ache strong pain varies in intensity Aggravating factors: prolonged standing and walking Relieving factors: medicine, rest, ice, laying down, try stretches Goal status: INITIAL  3.  Pt will decrease 5TSTS by at least 3 seconds in order to demonstrate clinically significant improvement in LE strength. Baseline:  EVAL = 19.38 sec without UE Support Goal status: INITIAL  4.  Pt will increase by at least 41m (199ft) in order to demonstrate clinically significant improvement in cardiopulmonary endurance and community ambulation  Baseline: To be assessed visit #2; 05/01/2024= 980 feet without AD Goal status: INITIAL  5.  Patient will increase 10 meter walk test to >1.15m/s as to improve gait speed for better community ambulation and to reduce fall risk. Baseline: To be assessed visit 2; 04/27/2024= 0.84 ms without AD Goal status: INITIAL   PLAN:  PT FREQUENCY: 1-2x/week  PT DURATION: 12  weeks  PLANNED INTERVENTIONS: [id number]- PT Re-evaluation, 97750- Physical Performance Testing, 97110-Therapeutic exercises, 97530- Therapeutic activity, [id number]- Neuromuscular re-education, 97535- Self Care, [id number]- Manual therapy, [id number]- Gait training, [phone]- Orthotic Initial, [phone]- Orthotic/Prosthetic subsequent, [phone]- Electrical stimulation (manual), [phone]- Traction (mechanical), [phone] (1-2 muscles), [id number] (3+ muscles)- Dry Needling, Patient/Family education, Balance training, Stair training, Taping, Joint mobilization, Joint manipulation, Spinal manipulation, Spinal mobilization, Scar mobilization, Compression bandaging, Vestibular training, DME instructions, Cryotherapy, and Moist heat.  PLAN FOR NEXT SESSION:   Manual therapy for ROM/pain relief NMR for postural awareness Body mechanics assessment and add goal if needed Progress HEP next visit   Reyes LOISE London, PT Physical Therapist [place] at [facility] [phone] 05/01/2024, 11:49 AM

## 2024-05-03 ENCOUNTER — Ambulatory Visit (INDEPENDENT_AMBULATORY_CARE_PROVIDER_SITE_OTHER): Admitting: Family Medicine

## 2024-05-03 ENCOUNTER — Encounter: Payer: Self-pay | Admitting: Family Medicine

## 2024-05-03 VITALS — BP 101/65 | HR 101

## 2024-05-03 DIAGNOSIS — R350 Frequency of micturition: Secondary | ICD-10-CM

## 2024-05-03 DIAGNOSIS — I152 Hypertension secondary to endocrine disorders: Secondary | ICD-10-CM

## 2024-05-03 DIAGNOSIS — E538 Deficiency of other specified B group vitamins: Secondary | ICD-10-CM | POA: Diagnosis not present

## 2024-05-03 DIAGNOSIS — E1140 Type 2 diabetes mellitus with diabetic neuropathy, unspecified: Secondary | ICD-10-CM

## 2024-05-03 DIAGNOSIS — Z23 Encounter for immunization: Secondary | ICD-10-CM | POA: Diagnosis not present

## 2024-05-03 DIAGNOSIS — E1159 Type 2 diabetes mellitus with other circulatory complications: Secondary | ICD-10-CM | POA: Diagnosis not present

## 2024-05-03 DIAGNOSIS — M810 Age-related osteoporosis without current pathological fracture: Secondary | ICD-10-CM | POA: Diagnosis not present

## 2024-05-03 DIAGNOSIS — E119 Type 2 diabetes mellitus without complications: Secondary | ICD-10-CM | POA: Diagnosis not present

## 2024-05-03 DIAGNOSIS — Z794 Long term (current) use of insulin: Secondary | ICD-10-CM | POA: Diagnosis not present

## 2024-05-03 DIAGNOSIS — E559 Vitamin D deficiency, unspecified: Secondary | ICD-10-CM

## 2024-05-03 LAB — POCT GLYCOSYLATED HEMOGLOBIN (HGB A1C): Hemoglobin A1C: 5.3 % (ref 4.0–5.6)

## 2024-05-03 LAB — URINALYSIS, ROUTINE W REFLEX MICROSCOPIC
Bilirubin Urine: NEGATIVE
Hgb urine dipstick: NEGATIVE
Ketones, ur: NEGATIVE
Leukocytes,Ua: NEGATIVE
Nitrite: NEGATIVE
RBC / HPF: NONE SEEN (ref 0–?)
Specific Gravity, Urine: 1.025 (ref 1.000–1.030)
Total Protein, Urine: NEGATIVE
Urine Glucose: >=1000 — AB
Urobilinogen, UA: 0.2 (ref 0.0–1.0)
pH: 5.5 (ref 5.0–8.0)

## 2024-05-03 LAB — VITAMIN D 25 HYDROXY (VIT D DEFICIENCY, FRACTURES): VITD: 28.3 ng/mL — ABNORMAL LOW (ref 30.00–100.00)

## 2024-05-03 LAB — VITAMIN B12: Vitamin B-12: 581 pg/mL (ref 211–911)

## 2024-05-03 MED ORDER — DULOXETINE HCL 30 MG PO CPEP: 90.0000 mg | ORAL_CAPSULE | Freq: Every day | ORAL | 3 refills | Status: AC

## 2024-05-03 NOTE — Progress Notes (Signed)
 Subjective  CC:  Chief Complaint  Patient presents with   Hypertension   Diabetes   Urinary Frequency    X2 weeks    HPI: Robin Morgan is a 68 y.o. female who presents to the office today for follow up of diabetes and problems listed above in the chief complaint.  Discussed the use of AI scribe software for clinical note transcription with the patient, who gave verbal consent to proceed.  History of Present Illness Robin Morgan is a 68 year old female who presents with urinary symptoms suggestive of a urinary tract infection (UTI).  Lower urinary tract symptoms - Significant urinary urgency and frequency - Sudden urges to urinate with episodes of incontinence before reaching the bathroom - Symptoms have been persistent and worsening over time - Dysuria present, with severe episodes causing a sensation of a nerve shooting up her body into her jaws - Symptoms were particularly severe one week ago and have persisted - Recurrence of pain starting again this morning   Gastrointestinal symptoms - Ongoing gastrointestinal issues temporally associated with onset of urinary symptoms  Diabetes mellitus management - Occasional hypoglycemia - Currently taking insulin , with doses ranging between 28 and 31 units bid - A1c is 5.3 - Symptoms of hypoglycemia include urgent need to eat - Does not regularly check blood glucose during hypoglycemic episodes  Vitamin supplementation and medication needs - Taking vitamin B12 (1000 units daily) and vitamin D  (2000 units daily) since March - Due for a refill of Cymbalta       Wt Readings from Last 3 Encounters:  04/19/24 129 lb (58.5 kg)  04/12/24 129 lb (58.5 kg)  02/03/24 135 lb (61.2 kg)    BP Readings from Last 3 Encounters:  05/03/24 101/65  04/12/24 130/77  02/03/24 118/64    Assessment  1. Controlled type 2 diabetes mellitus with diabetic neuropathy, with long-term current use of insulin  (HCC)   2. Frequent urination    3. Insulin -requiring or dependent type II diabetes mellitus (HCC)   4. Hypertension associated with diabetes (HCC)   5. Age-related osteoporosis without current pathological fracture   6. Need for influenza vaccination   7. Urinary frequency   8. Vitamin B12 deficiency   9. Vitamin D  deficiency      Plan  Assessment and Plan Assessment & Plan Urinary frequency and urgency with suspected urinary tract infection Severe urinary frequency and urgency with incontinence and dysuria suggestive of UTI. Differential includes overactive bladder. - Order urinalysis. - Consider bladder medication if urinalysis is negative. For oab  Type 2 diabetes mellitus with diabetic neuropathy tight diabetes management with occasional hypoglycemia. A1c at 5.3, lower than desired. Insulin  regimen may contribute to hypoglycemia. Also eating less - Reduce Lantus  insulin  to 25 units twice daily.need to avoid hypoglycemia. - Advise monitoring blood glucose levels, aiming for 80-120 mg/dL in the morning and under 140 mg/dL two hours postprandial.   Depression Therapy positively impacts well-being. Continues Cymbalta  for management. - Refill Cymbalta  prescription.  Vitamin B12 and D deficiency Previously identified deficiencies. Currently taking supplements. - Order blood work to assess current vitamin B12 and D levels. - Continue current vitamin supplementation regimen.    Follow up: 32mo cpe Orders Placed This Encounter  Procedures   Urine Culture   Flu vaccine HIGH DOSE PF(Fluzone Trivalent)   Urinalysis, Routine w reflex microscopic   Vitamin B12   VITAMIN D  25 Hydroxy (Vit-D Deficiency, Fractures)   POCT HgB A1C   POCT Urinalysis Dipstick  Meds ordered this encounter  Medications   DULoxetine  (CYMBALTA ) 30 MG capsule    Sig: Take 3 capsules (90 mg total) by mouth daily.    Dispense:  270 capsule    Refill:  3      Immunization History  Administered Date(s) Administered   Fluad  Quad(high Dose 65+) 04/06/2019, 05/09/2020, 09/01/2021, 09/28/2022   INFLUENZA, HIGH DOSE SEASONAL PF 05/13/2020, 05/03/2024   Influenza Split 06/04/2009, 05/02/2012   Influenza, Quadrivalent, Recombinant, Inj, Pf 05/12/2016, 07/07/2017   Influenza, Seasonal, Injecte, Preservative Fre 06/06/2014, 04/24/2015   Influenza,inj,Quad PF,6+ Mos 05/12/2016, 07/07/2017, 05/05/2018   Influenza-Unspecified 09/03/2011, 04/19/2013, 05/05/2018   PFIZER(Purple Top)SARS-COV-2 Vaccination 10/03/2019, 10/24/2019, 06/28/2020   Pneumococcal Conjugate,unspecified 12/01/2019   Pneumococcal Conjugate-13 12/01/2019   Pneumococcal Polysaccharide-23 08/29/2009, 10/19/2018   Tdap 05/11/2012, 09/28/2022    Diabetes Related Lab Review: Lab Results  Component Value Date   HGBA1C 5.3 05/03/2024   HGBA1C 6.6 (A) 10/25/2023   HGBA1C 5.9 (A) 04/22/2023    Lab Results  Component Value Date   MICROALBUR 2.9 (H) 10/25/2023   Lab Results  Component Value Date   CREATININE 1.08 10/25/2023   BUN 16 10/25/2023   NA 141 10/25/2023   K 4.4 10/25/2023   CL 109 10/25/2023   CO2 21 10/25/2023   Lab Results  Component Value Date   CHOL 124 10/25/2023   CHOL 134 09/28/2022   CHOL 128 09/01/2021   Lab Results  Component Value Date   HDL 39.40 10/25/2023   HDL 43.50 09/28/2022   HDL 41.90 09/01/2021   Lab Results  Component Value Date   LDLCALC 52 10/25/2023   LDLCALC 58 09/28/2022   LDLCALC 64 09/01/2021   Lab Results  Component Value Date   TRIG 163.0 (H) 10/25/2023   TRIG 160.0 (H) 09/28/2022   TRIG 112.0 09/01/2021   Lab Results  Component Value Date   CHOLHDL 3 10/25/2023   CHOLHDL 3 09/28/2022   CHOLHDL 3 09/01/2021   No results found for: LDLDIRECT The ASCVD Risk score (Arnett DK, et al., 2019) failed to calculate for the following reasons:   The valid total cholesterol range is 130 to 320 mg/dL I have reviewed the PMH, Fam and Soc history. Patient Active Problem List   Diagnosis Date  Noted   Insomnia due to medical condition 12/01/2019    Priority: High   Chronic prescription opiate use 09/02/2018    Priority: High   Diabetic peripheral neuropathy associated with type 2 diabetes mellitus (HCC) 01/06/2017    Priority: High   OSA (obstructive sleep apnea) 03/10/2016    Priority: High   Controlled diabetes mellitus with diabetic neuropathy, with long-term current use of insulin  (HCC) 10/11/2014    Priority: High   Hypertension associated with diabetes (HCC) 10/11/2014    Priority: High   Combined hyperlipidemia associated with type 2 diabetes mellitus (HCC) 10/11/2014    Priority: High   DDD (degenerative disc disease), lumbar 01/15/2014    Priority: High   Hypothyroidism, acquired 06/12/2013    Priority: High   Cervical post-laminectomy syndrome 05/10/2013    Priority: High   Depression, recurrent 10/28/2011    Priority: High   Chronic pain associated with significant psychosocial dysfunction 01/03/2010    Priority: High    Chronic Pain Syndrome, Dr. Karolynn     Migraine without aura, intractable, without status migrainosus 07/31/2020    Priority: Medium    Esophageal stricture     Priority: Medium    Tenosynovitis, wrist 10/07/2017  Priority: Medium    Trigger thumb of left hand 10/07/2017    Priority: Medium    Laryngopharyngeal reflux (LPR) 08/26/2017    Priority: Medium    Dysphagia 08/26/2017    Priority: Medium    Left carpal tunnel syndrome 03/08/2017    Priority: Medium    Fibromyalgia 02/12/2016    Priority: Medium    Osteoporosis 12/01/2012    Priority: Medium     01/2021: osteoporosis T= -2.7 wrist. Spine excluded. Offer tx.  10/2017: osteopenia: T = -1.9; stable. Recheck 2 years 4/ 2014 T = -1.2 at hip, -1.8 at spine    Chronic diarrhea 07/04/2012    Priority: Medium     Overview:  ? Related to gallbladder surgery. Has been evaluated by Dr. Dyane GI. Lomotil  as needed.    Vitamin D  deficiency 09/28/2022    Priority: Low    Vitamin B12 deficiency 09/28/2022    Priority: Low   Traumatic tear of left rotator cuff 09/28/2022   Urinary urgency 09/28/2022   Rotator cuff syndrome, left 12/11/2020   Cervical disc disorder with radiculopathy of cervical region 07/31/2020   Biceps tendinitis, right 02/07/2020   Mid back pain 12/06/2019   Myofascial muscle pain 10/28/2011    Social History: Patient  reports that she has never smoked. She has never used smokeless tobacco. She reports that she does not drink alcohol  and does not use drugs.  Review of Systems: Ophthalmic: negative for eye pain, loss of vision or double vision Cardiovascular: negative for chest pain Respiratory: negative for SOB or persistent cough Gastrointestinal: negative for abdominal pain Genitourinary: negative for dysuria or gross hematuria MSK: negative for foot lesions Neurologic: negative for weakness or gait disturbance  Objective  Vitals: BP 101/65   Pulse (!) 101   SpO2 98%  General: well appearing, no acute distress  Psych:  Alert and oriented, normal mood and affect HEENT:  Normocephalic, atraumatic, moist mucous membranes, supple neck  Cardiovascular:  Nl S1 and S2, RRR without murmur, gallop or rub. no edema Respiratory:  Good breath sounds bilaterally, CTAB with normal effort, no rales Gastrointestinal: normal BS, soft, nontender   Diabetic education: ongoing education regarding chronic disease management for diabetes was given today. We continue to reinforce the ABC's of diabetic management: A1c (<7 or 8 dependent upon patient), tight blood pressure control, and cholesterol management with goal LDL < 100 minimally. We discuss diet strategies, exercise recommendations, medication options and possible side effects. At each visit, we review recommended immunizations and preventive care recommendations for diabetics and stress that good diabetic control can prevent other problems. See below for this patient's data. Commons side  effects, risks, benefits, and alternatives for medications and treatment plan prescribed today were discussed, and the patient expressed understanding of the given instructions. Patient is instructed to call or message via MyChart if he/she has any questions or concerns regarding our treatment plan. No barriers to understanding were identified. We discussed Red Flag symptoms and signs in detail. Patient expressed understanding regarding what to do in case of urgent or emergency type symptoms.  Medication list was reconciled, printed and provided to the patient in AVS. Patient instructions and summary information was reviewed with the patient as documented in the AVS. This note was prepared with assistance of Dragon voice recognition software. Occasional wrong-word or sound-a-like substitutions may have occurred due to the inherent limitations of voice recognition software

## 2024-05-03 NOTE — Patient Instructions (Signed)
 Please return in 6 months for your annual complete physical; please come fasting.  For follow up on chronic medical conditions   If you have any questions or concerns, please don't hesitate to send me a message via MyChart or call the office at [phone]. Thank you for visiting with us  today! It's our pleasure caring for you.

## 2024-05-04 ENCOUNTER — Ambulatory Visit

## 2024-05-04 DIAGNOSIS — M6281 Muscle weakness (generalized): Secondary | ICD-10-CM

## 2024-05-04 DIAGNOSIS — R2689 Other abnormalities of gait and mobility: Secondary | ICD-10-CM

## 2024-05-04 DIAGNOSIS — M546 Pain in thoracic spine: Secondary | ICD-10-CM | POA: Diagnosis not present

## 2024-05-04 DIAGNOSIS — G8929 Other chronic pain: Secondary | ICD-10-CM

## 2024-05-04 DIAGNOSIS — G894 Chronic pain syndrome: Secondary | ICD-10-CM | POA: Diagnosis not present

## 2024-05-04 DIAGNOSIS — M5441 Lumbago with sciatica, right side: Secondary | ICD-10-CM | POA: Diagnosis not present

## 2024-05-04 DIAGNOSIS — M5416 Radiculopathy, lumbar region: Secondary | ICD-10-CM | POA: Diagnosis not present

## 2024-05-04 LAB — URINE CULTURE
MICRO NUMBER:: 17011254
Result:: NO GROWTH
SPECIMEN QUALITY:: ADEQUATE

## 2024-05-04 NOTE — Therapy (Signed)
 OUTPATIENT PHYSICAL THERAPY THORACOLUMBAR TREATMENT   Patient Name: Robin Morgan MRN: [id number] DOB:02-22-1956, 68 y.o., female Today's Date: 05/04/2024  END OF SESSION:  PT End of Session - 05/04/24 0910     Visit Number 3    Number of Visits 24    Date for Recertification  07/20/24    Progress Note Due on Visit 10    PT Start Time 0910    PT Stop Time 0955    PT Time Calculation (min) 45 min    Activity Tolerance Patient tolerated treatment well    Behavior During Therapy WFL for tasks assessed/performed            Past Medical History:  Diagnosis Date   Calcific tendonitis    Cervical spondylosis without myelopathy    Depression    Diabetes mellitus    Esophageal stricture    Fibromyalgia    GERD (gastroesophageal reflux disease)    Hiatal hernia    Hyperlipidemia    Hypertension    Past Surgical History:  Procedure Laterality Date   ABDOMINAL HYSTERECTOMY     1986   APPENDECTOMY     1983   BREAST EXCISIONAL BIOPSY Right 1990's   BREAST SURGERY     rt breast cyst done in the 90's   CHOLECYSTECTOMY     GALLBLADDER SURGERY     1983   ROTATOR CUFF REPAIR     right   SPINE SURGERY     TONSILLECTOMY     1973   Patient Active Problem List   Diagnosis Date Noted   Traumatic tear of left rotator cuff 09/28/2022   Vitamin D  deficiency 09/28/2022   Vitamin B12 deficiency 09/28/2022   Urinary urgency 09/28/2022   Rotator cuff syndrome, left 12/11/2020   Migraine without aura, intractable, without status migrainosus 07/31/2020   Cervical disc disorder with radiculopathy of cervical region 07/31/2020   Biceps tendinitis, right 02/07/2020   Mid back pain 12/06/2019   Insomnia due to medical condition 12/01/2019   Chronic prescription opiate use 09/02/2018   Esophageal stricture    Tenosynovitis, wrist 10/07/2017   Trigger thumb of left hand 10/07/2017   Laryngopharyngeal reflux (LPR) 08/26/2017   Dysphagia 08/26/2017   Left carpal tunnel syndrome  03/08/2017   Diabetic peripheral neuropathy associated with type 2 diabetes mellitus (HCC) 01/06/2017   OSA (obstructive sleep apnea) 03/10/2016   Fibromyalgia 02/12/2016   Controlled diabetes mellitus with diabetic neuropathy, with long-term current use of insulin  (HCC) 10/11/2014   Hypertension associated with diabetes (HCC) 10/11/2014   Combined hyperlipidemia associated with type 2 diabetes mellitus (HCC) 10/11/2014   DDD (degenerative disc disease), lumbar 01/15/2014   Hypothyroidism, acquired 06/12/2013   Cervical post-laminectomy syndrome 05/10/2013   Osteoporosis 12/01/2012   Chronic diarrhea 07/04/2012   Myofascial muscle pain 10/28/2011   Depression, recurrent 10/28/2011   Chronic pain associated with significant psychosocial dysfunction 01/03/2010    PCP: Dr. Lavern Heck  REFERRING PROVIDER: Fidela Ned, NP  REFERRING DIAG:  G89.4 (ICD-10-CM) - Chronic pain syndrome  M54.6,G89.29 (ICD-10-CM) - Chronic bilateral thoracic back pain  M54.16 (ICD-10-CM) - Right lumbar radiculitis    Rationale for Evaluation and Treatment: Rehabilitation  THERAPY DIAG:  Chronic right-sided low back pain with right-sided sciatica  Pain in thoracic spine  Muscle weakness (generalized)  Other abnormalities of gait and mobility  ONSET DATE: Over 20 years but just progressively  SUBJECTIVE:  SUBJECTIVE STATEMENT: From Today: Patient reports went to MD and had some low blood sugar. States did okay with the stretches but having some difficulty with technique. Rates pain at 8/10.    From EVAL: I am here because I am struggling with leg weakness. When I squat I can't get up. Of course I have chronic pain including my neck, mid back, low back and hips. Been dealing with all this for a long time but sure  would like to gain some strength if possible.   PERTINENT HISTORY:  Patient was referred per office visit on 04/12/2024-  with known myofascial pain with fibromyalgia and chronic mid/low back apin with lumbar spondylosis and radiculopathy. PMH also including: Rotator cuff syndrome, Cervicalgia, CTS, Depression, OA of right hand, Migraines, Left shoulder pain. Hx of falls- but none recent  PAIN:  Are you having pain? Yes: NPRS scale: Mid back - standing in one place and walking with my low back- can't lay on either side; neck pain as well. Rating mid back- current= 8/10; worst = 9/10; best= 7/10; low back = current= 6/10; worst= 9/10; best= 6/10 (laying down)  Pain location: Mid back, low back, and cervical - pain starts on my right side - cramping at times and runs down my right leg Pain description: constant ache strong pain varies in intensity Aggravating factors: prolonged standing and walking Relieving factors: medicine, rest, ice, laying down, try stretches  PRECAUTIONS: None  RED FLAGS: None   WEIGHT BEARING RESTRICTIONS: No  FALLS:  Has patient fallen in last 6 months? No  LIVING ENVIRONMENT: Lives with: lives alone Lives in: House/apartment Stairs: flat entry Has following equipment at home: Grab bars  OCCUPATION: retired- mostly sedentary due to pain-  PLOF: Independent  PATIENT GOALS: Strength to get better.   NEXT MD VISIT: next week -Week of 9/22  OBJECTIVE:  Note: Objective measures were completed at Evaluation unless otherwise noted.  DIAGNOSTIC FINDINGS:  CLINICAL DATA:  Upper back pain   EXAM: THORACIC SPINE - 3 VIEWS   COMPARISON:  04/06/2023   FINDINGS: Vertebral body height is well maintained. Multilevel osteophytic changes are noted. No paraspinal mass is noted. Soft tissue abnormality is noted.   IMPRESSION: Degenerative change without acute abnormality.     Electronically Signed   By: Oneil Devonshire M.D.   On: 04/26/2024 02:36  PATIENT  SURVEYS:  Modified Oswestry:  MODIFIED OSWESTRY DISABILITY SCALE  Date: 04/27/2024 Score  Pain intensity 3 =  Pain medication provides me with moderate relief from pain.  2. Personal care (washing, dressing, etc.) 1 =  I can take care of myself normally, but it increases my pain.  3. Lifting 3 = Pain prevents me from lifting heavy weights, but I can manage (5) I have hardly any social life because of my pain. light to medium weights if they are conveniently positioned  4. Walking 3 =  Pain prevents me from walking more than  mile.  5. Sitting 2 =  Pain prevents me from sitting more than 1 hour.  6. Standing 3 =  Pain prevents me from standing more than 1/2 hour.  7. Sleeping 2 =  Even when I take pain medication, I sleep less than 6 hours  8. Social Life 1 =  My social life is normal, but it increases my level of pain.  9. Traveling 2 =  My pain restricts my travel over 2 hours.  10. Employment/ Homemaking 3 = Pain prevents me from doing anything but  light duties.  Total 23/50 or 46% (MOD)   Interpretation of scores: Score Category Description  0-20% Minimal Disability The patient can cope with most living activities. Usually no treatment is indicated apart from advice on lifting, sitting and exercise  21-40% Moderate Disability The patient experiences more pain and difficulty with sitting, lifting and standing. Travel and social life are more difficult and they may be disabled from work. Personal care, sexual activity and sleeping are not grossly affected, and the patient can usually be managed by conservative means  41-60% Severe Disability Pain remains the main problem in this group, but activities of daily living are affected. These patients require a detailed investigation  61-80% Crippled Back pain impinges on all aspects of the patient's life. Positive intervention is required  81-100% Bed-bound  These patients are either bed-bound or exaggerating their symptoms  Bluford FORBES Zoe DELENA Karon DELENA, et al. Surgery versus conservative management of stable thoracolumbar fracture: the PRESTO feasibility RCT. [place] ([place]): [employer]; 2021 Nov. [facility] Technology Assessment, No. 25.62.) Appendix 3, Oswestry Disability Index category descriptors. Available from: Find[employer]el[employer].cz  Minimally Clinically Important Difference (MCID) = 12.8%  COGNITION: Overall cognitive status: Within functional limits for tasks assessed     SENSATION: Light touch: Impaired - lateral thighs    POSTURE: rounded shoulders, forward head, and decreased lumbar lordosis  PALPATION: (+) tenderness mostly along right gluteal region today  LUMBAR ROM:   AROM eval  Flexion Fingertips to ankles  Extension WNL  Right lateral flexion Fingertips  1.5 in   Left lateral flexion Fingertips 2 in  Right rotation   Left rotation    (Blank rows = not tested)  LOWER EXTREMITY MMT    Active  Right eval Left eval  Hip flexion 4 4  Hip extension    Hip abduction 4 4  Hip adduction    Hip internal rotation 4 4  Hip external rotation 4 4  Knee flexion 4 4  Knee extension 4 4  Ankle dorsiflexion 4 4  Ankle plantarflexion    Ankle inversion    Ankle eversion     (Blank rows = not tested)   LUMBAR SPECIAL TESTS:  Straight leg raise test: Positive and Slump test: Positive  FUNCTIONAL TESTS:  5 times sit to stand: 19.38 without UE support *painful back 6 minute walk test: To be assessed visit 2 10 meter walk test: To be assessed visit 2  GAIT: Distance walked: 80 feet approx Assistive device utilized: None Level of assistance: Complete Independence Comments: decreased step length  TREATMENT DATE: 05/04/24  Reviewed and some beginner low back stretching/ROM and core strengthening for pain management and ROM.   -Single knee to chest- hold 30 sec x 3 each LE -Supine lower trunk rotation- Hold 30 sec x 3 each side --Supine figure 4 stetch BLE hold 30  sec x 4 with towel -Supine piriformis - hold 30 sec x3 each LE -Seated hamstring- Hold 30 sec x 3 each LE -Seated figure 4 stretch- Hold 30 sec x 3 each LE  -Seated piriformis stretch- Hold 30 sec x 3 each LE -Seated lumbar flex stretch using silver theraball 2 x 10 reps with slow motion. Patient reported feeling good stretch -Supine bridging hold 3 sec 2 x 10 reps       PATIENT EDUCATION:  Education details: Purpose of functional outcome testing and introduction to HEP Person educated: Patient Education method: Explanation, Demonstration, Tactile cues, and Verbal cues Education comprehension: verbalized understanding, returned  demonstration, verbal cues required, tactile cues required, and needs further education  HOME EXERCISE PROGRAM: Revised HEP on 05/04/2024 Access Code: [email or web address] URL: [email or web address] Date: 05/04/2024 Prepared by: Reyes London  Exercises - Supine Single Knee to Chest Stretch  - 1 x daily - 3 sets - 30 sec hold - Supine Lower Trunk Rotation  - 1 x daily - 3 sets - 30 sec hold - Supine Piriformis Stretch with Foot on Ground  - 1 x daily - 3 sets - 30 sec hold - Supine Piriformis Stretch with Foot on Ground  - 1 x daily - 3 sets - 30 hold - Seated Hamstring Stretch  - 1 x daily - 3 sets - 30 sec hold - Seated Figure 4 Piriformis Stretch  - 1 x daily - 3 sets - 30 hold        Access Code: [email or web address] URL: [email or web address] Date: 05/01/2024 Prepared by: Reyes London  Exercises - Supine Single Knee to Chest Stretch  - 1 x daily - 3 sets - 30 sec hold - Supine Lower Trunk Rotation  - 1 x daily - 3 sets - 30 sec hold - Supine Piriformis Stretch with Foot on Ground  - 1 x daily - 3 sets - 30 sec hold - Supine Figure 4 Piriformis Stretch  - 1 x daily - 7 x weekly - 3 sets - 10 reps - Supine Piriformis Stretch with Foot on Ground  - 1 x daily - 3 sets - 30 hold - Seated Hamstring Stretch  - 1 x daily - 3 sets  - 30 sec hold  ASSESSMENT:  CLINICAL IMPRESSION: Patient is a 68 y.o. female who was seen today for physical therapy treatment for chronic pain syndrome and mid/low back pain with radiculopathy. Treatment focused on review of low back/Hip/LE stretching for optimal understanding of newly developed HEP. Patient again responded well to all activities instructed in today without report of any increased low back pain. Will plan to add more core stabilization and LE strengthening next visit if appropriate.  Pt will benefit from skilled PT services to address deficits and return to pain-free function at home and work.   OBJECTIVE IMPAIRMENTS: Abnormal gait, decreased activity tolerance, decreased balance, decreased mobility, difficulty walking, decreased ROM, decreased strength, hypomobility, increased fascial restrictions, impaired flexibility, and pain.   ACTIVITY LIMITATIONS: carrying, lifting, bending, sitting, standing, squatting, stairs, and transfers  PARTICIPATION LIMITATIONS: meal prep, cleaning, laundry, shopping, community activity, and yard work  PERSONAL FACTORS: Time since onset of injury/illness/exacerbation and 3+ comorbidities: HTN, DM, Fibromyalgia, Spondylosis are also affecting patient's functional outcome.   REHAB POTENTIAL: Good  CLINICAL DECISION MAKING: Evolving/moderate complexity  EVALUATION COMPLEXITY: Moderate   GOALS: Goals reviewed with patient? Yes  SHORT TERM GOALS: Target date: 06/08/2024  Pt will be independent with HEP in order to improve strength and decrease back pain in order to improve pain-free function at home Baseline: EVAL- No formal HEP in place Goal status: INITIAL   LONG TERM GOALS: Target date: 07/19/2024  Pt will decrease mODI score by at least 13 points in order demonstrate clinically significant reduction in back pain/disability.  Baseline: EVAL = 46% Goal status: INITIAL  2.  Pt will decrease worst back pain as reported on NPRS by at  least 2 points in order to demonstrate clinically significant reduction in back pain.  Baseline:  EVAL:  Mid back - standing in one place and walking with my low back- can't lay on either side; neck pain as well. Rating  mid back- current= 8/10; worst = 9/10; best= 7/10; low back = current= 6/10; worst= 9/10; best= 6/10 (laying down)  Pain location: Mid back, low back, and cervical - pain starts on my right side - cramping at times and runs down my right leg Pain description: constant ache strong pain varies in intensity Aggravating factors: prolonged standing and walking Relieving factors: medicine, rest, ice, laying down, try stretches Goal status: INITIAL  3.  Pt will decrease 5TSTS by at least 3 seconds in order to demonstrate clinically significant improvement in LE strength. Baseline:  EVAL = 19.38 sec without UE Support Goal status: INITIAL  4.  Pt will increase by at least 55m (193ft) in order to demonstrate clinically significant improvement in cardiopulmonary endurance and community ambulation  Baseline: To be assessed visit #2; 05/01/2024= 980 feet without AD Goal status: INITIAL  5.  Patient will increase 10 meter walk test to >1.23m/s as to improve gait speed for better community ambulation and to reduce fall risk. Baseline: To be assessed visit 2; 04/27/2024= 0.84 ms without AD Goal status: INITIAL   PLAN:  PT FREQUENCY: 1-2x/week  PT DURATION: 12 weeks  PLANNED INTERVENTIONS: 97164- PT Re-evaluation, 97750- Physical Performance Testing, 97110-Therapeutic exercises, 97530- Therapeutic activity, [id number]- Neuromuscular re-education, 97535- Self Care, [id number]- Manual therapy, [id number]- Gait training, [phone]- Orthotic Initial, [phone]- Orthotic/Prosthetic subsequent, [phone]- Electrical stimulation (manual), [phone]- Traction (mechanical), [phone] (1-2 muscles), [phone] (3+ muscles)- Dry Needling, Patient/Family education, Balance training, Stair training, Taping, Joint mobilization, Joint  manipulation, Spinal manipulation, Spinal mobilization, Scar mobilization, Compression bandaging, Vestibular training, DME instructions, Cryotherapy, and Moist heat.  PLAN FOR NEXT SESSION:  Progress core strengthening next visit.  Manual therapy for ROM/pain relief NMR for postural awareness Body mechanics assessment and add goal if needed Progress HEP next visit   Reyes LOISE London, PT Physical Therapist  at [facility] [phone] 05/04/2024, 10:01 AM

## 2024-05-08 ENCOUNTER — Ambulatory Visit: Payer: Self-pay | Admitting: Family Medicine

## 2024-05-08 ENCOUNTER — Ambulatory Visit

## 2024-05-08 NOTE — Progress Notes (Signed)
 See mychart note Dear Ms. Dyckman, Your urine is not infected. If your symptoms persist, we can consider bladder medication for overactive bladder.  Also can consider yeast infections.  Let me know. Your vitamin D  is a little low; double up on your supplements.  Sincerely, Dr. Jodie

## 2024-05-10 ENCOUNTER — Ambulatory Visit: Attending: Registered Nurse

## 2024-05-10 DIAGNOSIS — G8929 Other chronic pain: Secondary | ICD-10-CM | POA: Insufficient documentation

## 2024-05-10 DIAGNOSIS — M546 Pain in thoracic spine: Secondary | ICD-10-CM | POA: Insufficient documentation

## 2024-05-10 DIAGNOSIS — R2689 Other abnormalities of gait and mobility: Secondary | ICD-10-CM | POA: Diagnosis present

## 2024-05-10 DIAGNOSIS — M6281 Muscle weakness (generalized): Secondary | ICD-10-CM | POA: Diagnosis present

## 2024-05-10 DIAGNOSIS — M5441 Lumbago with sciatica, right side: Secondary | ICD-10-CM | POA: Insufficient documentation

## 2024-05-10 NOTE — Therapy (Signed)
 OUTPATIENT PHYSICAL THERAPY THORACOLUMBAR TREATMENT   Patient Name: Robin Morgan MRN: [id number] DOB:1956/04/20, 68 y.o., female Today's Date: 05/11/2024  END OF SESSION:  PT End of Session - 05/10/24 1023     Visit Number 4    Number of Visits 24    Date for Recertification  07/20/24    Progress Note Due on Visit 10    PT Start Time 1020    PT Stop Time 1103    PT Time Calculation (min) 43 min    Activity Tolerance Patient tolerated treatment well    Behavior During Therapy WFL for tasks assessed/performed            Past Medical History:  Diagnosis Date   Calcific tendonitis    Cervical spondylosis without myelopathy    Depression    Diabetes mellitus    Esophageal stricture    Fibromyalgia    GERD (gastroesophageal reflux disease)    Hiatal hernia    Hyperlipidemia    Hypertension    Past Surgical History:  Procedure Laterality Date   ABDOMINAL HYSTERECTOMY     1986   APPENDECTOMY     1983   BREAST EXCISIONAL BIOPSY Right 1990's   BREAST SURGERY     rt breast cyst done in the 90's   CHOLECYSTECTOMY     GALLBLADDER SURGERY     1983   ROTATOR CUFF REPAIR     right   SPINE SURGERY     TONSILLECTOMY     1973   Patient Active Problem List   Diagnosis Date Noted   Traumatic tear of left rotator cuff 09/28/2022   Vitamin D  deficiency 09/28/2022   Vitamin B12 deficiency 09/28/2022   Urinary urgency 09/28/2022   Rotator cuff syndrome, left 12/11/2020   Migraine without aura, intractable, without status migrainosus 07/31/2020   Cervical disc disorder with radiculopathy of cervical region 07/31/2020   Biceps tendinitis, right 02/07/2020   Mid back pain 12/06/2019   Insomnia due to medical condition 12/01/2019   Chronic prescription opiate use 09/02/2018   Esophageal stricture    Tenosynovitis, wrist 10/07/2017   Trigger thumb of left hand 10/07/2017   Laryngopharyngeal reflux (LPR) 08/26/2017   Dysphagia 08/26/2017   Left carpal tunnel syndrome  03/08/2017   Diabetic peripheral neuropathy associated with type 2 diabetes mellitus (HCC) 01/06/2017   OSA (obstructive sleep apnea) 03/10/2016   Fibromyalgia 02/12/2016   Controlled diabetes mellitus with diabetic neuropathy, with long-term current use of insulin  (HCC) 10/11/2014   Hypertension associated with diabetes (HCC) 10/11/2014   Combined hyperlipidemia associated with type 2 diabetes mellitus (HCC) 10/11/2014   DDD (degenerative disc disease), lumbar 01/15/2014   Hypothyroidism, acquired 06/12/2013   Cervical post-laminectomy syndrome 05/10/2013   Osteoporosis 12/01/2012   Chronic diarrhea 07/04/2012   Myofascial muscle pain 10/28/2011   Depression, recurrent 10/28/2011   Chronic pain associated with significant psychosocial dysfunction 01/03/2010    PCP: Dr. Lavern Heck  REFERRING PROVIDER: Fidela Ned, NP  REFERRING DIAG:  G89.4 (ICD-10-CM) - Chronic pain syndrome  M54.6,G89.29 (ICD-10-CM) - Chronic bilateral thoracic back pain  M54.16 (ICD-10-CM) - Right lumbar radiculitis    Rationale for Evaluation and Treatment: Rehabilitation  THERAPY DIAG:  Chronic right-sided low back pain with right-sided sciatica  Pain in thoracic spine  Muscle weakness (generalized)  Other abnormalities of gait and mobility  ONSET DATE: Over 20 years but just progressively  SUBJECTIVE:  SUBJECTIVE STATEMENT: From Today: Patient reports her back is feeling better but having more issues with constipation- having a consult with GI next week.    From EVAL: I am here because I am struggling with leg weakness. When I squat I can't get up. Of course I have chronic pain including my neck, mid back, low back and hips. Been dealing with all this for a long time but sure would like to gain some strength if  possible.   PERTINENT HISTORY:  Patient was referred per office visit on 04/12/2024-  with known myofascial pain with fibromyalgia and chronic mid/low back apin with lumbar spondylosis and radiculopathy. PMH also including: Rotator cuff syndrome, Cervicalgia, CTS, Depression, OA of right hand, Migraines, Left shoulder pain. Hx of falls- but none recent  PAIN:  Are you having pain? Yes: NPRS scale: Mid back - standing in one place and walking with my low back- can't lay on either side; neck pain as well. Rating mid back- current= 8/10; worst = 9/10; best= 7/10; low back = current= 6/10; worst= 9/10; best= 6/10 (laying down)  Pain location: Mid back, low back, and cervical - pain starts on my right side - cramping at times and runs down my right leg Pain description: constant ache strong pain varies in intensity Aggravating factors: prolonged standing and walking Relieving factors: medicine, rest, ice, laying down, try stretches  PRECAUTIONS: None  RED FLAGS: None   WEIGHT BEARING RESTRICTIONS: No  FALLS:  Has patient fallen in last 6 months? No  LIVING ENVIRONMENT: Lives with: lives alone Lives in: House/apartment Stairs: flat entry Has following equipment at home: Grab bars  OCCUPATION: retired- mostly sedentary due to pain-  PLOF: Independent  PATIENT GOALS: Strength to get better.   NEXT MD VISIT: next week -Week of 9/22  OBJECTIVE:  Note: Objective measures were completed at Evaluation unless otherwise noted.  DIAGNOSTIC FINDINGS:  CLINICAL DATA:  Upper back pain   EXAM: THORACIC SPINE - 3 VIEWS   COMPARISON:  04/06/2023   FINDINGS: Vertebral body height is well maintained. Multilevel osteophytic changes are noted. No paraspinal mass is noted. Soft tissue abnormality is noted.   IMPRESSION: Degenerative change without acute abnormality.     Electronically Signed   By: Oneil Devonshire M.D.   On: 04/26/2024 02:36  PATIENT SURVEYS:  Modified Oswestry:   MODIFIED OSWESTRY DISABILITY SCALE  Date: 04/27/2024 Score  Pain intensity 3 =  Pain medication provides me with moderate relief from pain.  2. Personal care (washing, dressing, etc.) 1 =  I can take care of myself normally, but it increases my pain.  3. Lifting 3 = Pain prevents me from lifting heavy weights, but I can manage (5) I have hardly any social life because of my pain. light to medium weights if they are conveniently positioned  4. Walking 3 =  Pain prevents me from walking more than  mile.  5. Sitting 2 =  Pain prevents me from sitting more than 1 hour.  6. Standing 3 =  Pain prevents me from standing more than 1/2 hour.  7. Sleeping 2 =  Even when I take pain medication, I sleep less than 6 hours  8. Social Life 1 =  My social life is normal, but it increases my level of pain.  9. Traveling 2 =  My pain restricts my travel over 2 hours.  10. Employment/ Homemaking 3 = Pain prevents me from doing anything but light duties.  Total 23/50 or 46% (  MOD)   Interpretation of scores: Score Category Description  0-20% Minimal Disability The patient can cope with most living activities. Usually no treatment is indicated apart from advice on lifting, sitting and exercise  21-40% Moderate Disability The patient experiences more pain and difficulty with sitting, lifting and standing. Travel and social life are more difficult and they may be disabled from work. Personal care, sexual activity and sleeping are not grossly affected, and the patient can usually be managed by conservative means  41-60% Severe Disability Pain remains the main problem in this group, but activities of daily living are affected. These patients require a detailed investigation  61-80% Crippled Back pain impinges on all aspects of the patient's life. Positive intervention is required  81-100% Bed-bound  These patients are either bed-bound or exaggerating their symptoms  Bluford FORBES Zoe DELENA Karon DELENA, et al. Surgery versus  conservative management of stable thoracolumbar fracture: the PRESTO feasibility RCT. [place] ([place]): [employer]; 2021 Nov. [facility] Technology Assessment, No. 25.62.) Appendix 3, Oswestry Disability Index category descriptors. Available from: FindJewelers.cz  Minimally Clinically Important Difference (MCID) = 12.8%  COGNITION: Overall cognitive status: Within functional limits for tasks assessed     SENSATION: Light touch: Impaired - lateral thighs    POSTURE: rounded shoulders, forward head, and decreased lumbar lordosis  PALPATION: (+) tenderness mostly along right gluteal region today  LUMBAR ROM:   AROM eval  Flexion Fingertips to ankles  Extension WNL  Right lateral flexion Fingertips  1.5 in   Left lateral flexion Fingertips 2 in  Right rotation   Left rotation    (Blank rows = not tested)  LOWER EXTREMITY MMT    Active  Right eval Left eval  Hip flexion 4 4  Hip extension    Hip abduction 4 4  Hip adduction    Hip internal rotation 4 4  Hip external rotation 4 4  Knee flexion 4 4  Knee extension 4 4  Ankle dorsiflexion 4 4  Ankle plantarflexion    Ankle inversion    Ankle eversion     (Blank rows = not tested)   LUMBAR SPECIAL TESTS:  Straight leg raise test: Positive and Slump test: Positive  FUNCTIONAL TESTS:  5 times sit to stand: 19.38 without UE support *painful back 6 minute walk test: To be assessed visit 2 10 meter walk test: To be assessed visit 2  GAIT: Distance walked: 80 feet approx Assistive device utilized: None Level of assistance: Complete Independence Comments: decreased step length  TREATMENT DATE: 05/11/24   Therex:   -Seated hamstring- Hold 30 sec x 3 each LE -Seated figure 4 stretch- Hold 30 sec x 3 each LE  -Seated piriformis stretch- Hold 30 sec x 3 each LE  Core activation: Supine hooklye TrA contraction- hold 5 sec x 10 reps (good mm contraction)  Supine Gluteal Sets  -hold 5 sec x 10  Bridging 2 x 10 reps Sidelye clam shells x 10 reps (min burning on RLE)  Single knee to chest- hold 30 sec x 3 each LE Supine lower trunk rotation-  each side x 20 (2-3 sec hold only)  Supine double knee to chest- with LE on green theraball x 20         PATIENT EDUCATION:  Education details: Purpose of functional outcome testing and introduction to HEP Person educated: Patient Education method: Explanation, Demonstration, Tactile cues, and Verbal cues Education comprehension: verbalized understanding, returned demonstration, verbal cues required, tactile cues required, and needs further  education  HOME EXERCISE PROGRAM: Revised HEP on 05/04/2024 Access Code: [email or web address] URL: [email or web address] Date: 05/04/2024 Prepared by: Reyes London  Exercises - Supine Single Knee to Chest Stretch  - 1 x daily - 3 sets - 30 sec hold - Supine Lower Trunk Rotation  - 1 x daily - 3 sets - 30 sec hold - Supine Piriformis Stretch with Foot on Ground  - 1 x daily - 3 sets - 30 sec hold - Supine Piriformis Stretch with Foot on Ground  - 1 x daily - 3 sets - 30 hold - Seated Hamstring Stretch  - 1 x daily - 3 sets - 30 sec hold - Seated Figure 4 Piriformis Stretch  - 1 x daily - 3 sets - 30 hold        Access Code: [email or web address] URL: [email or web address] Date: 05/01/2024 Prepared by: Reyes London  Exercises - Supine Single Knee to Chest Stretch  - 1 x daily - 3 sets - 30 sec hold - Supine Lower Trunk Rotation  - 1 x daily - 3 sets - 30 sec hold - Supine Piriformis Stretch with Foot on Ground  - 1 x daily - 3 sets - 30 sec hold - Supine Figure 4 Piriformis Stretch  - 1 x daily - 7 x weekly - 3 sets - 10 reps - Supine Piriformis Stretch with Foot on Ground  - 1 x daily - 3 sets - 30 hold - Seated Hamstring Stretch  - 1 x daily - 3 sets - 30 sec hold  ASSESSMENT:  CLINICAL IMPRESSION: Patient is a 68 y.o. female who was seen today  for physical therapy treatment for chronic pain syndrome and mid/low back pain with radiculopathy. Treatment added more core stabilization today without report of pain. Patient did exhibit weak transverse abdominals and will benefit from further strengthening. Pt will benefit from skilled PT services to address deficits and return to pain-free function at home and work.   OBJECTIVE IMPAIRMENTS: Abnormal gait, decreased activity tolerance, decreased balance, decreased mobility, difficulty walking, decreased ROM, decreased strength, hypomobility, increased fascial restrictions, impaired flexibility, and pain.   ACTIVITY LIMITATIONS: carrying, lifting, bending, sitting, standing, squatting, stairs, and transfers  PARTICIPATION LIMITATIONS: meal prep, cleaning, laundry, shopping, community activity, and yard work  PERSONAL FACTORS: Time since onset of injury/illness/exacerbation and 3+ comorbidities: HTN, DM, Fibromyalgia, Spondylosis are also affecting patient's functional outcome.   REHAB POTENTIAL: Good  CLINICAL DECISION MAKING: Evolving/moderate complexity  EVALUATION COMPLEXITY: Moderate   GOALS: Goals reviewed with patient? Yes  SHORT TERM GOALS: Target date: 06/08/2024  Pt will be independent with HEP in order to improve strength and decrease back pain in order to improve pain-free function at home Baseline: EVAL- No formal HEP in place Goal status: INITIAL   LONG TERM GOALS: Target date: 07/19/2024  Pt will decrease mODI score by at least 13 points in order demonstrate clinically significant reduction in back pain/disability.  Baseline: EVAL = 46% Goal status: INITIAL  2.  Pt will decrease worst back pain as reported on NPRS by at least 2 points in order to demonstrate clinically significant reduction in back pain.  Baseline:  EVAL:  Mid back - standing in one place and walking with my low back- can't lay on either side; neck pain as well. Rating mid back- current= 8/10; worst  = 9/10; best= 7/10; low back = current= 6/10; worst= 9/10; best= 6/10 (laying down)  Pain location: Mid back, low back, and cervical - pain starts on my right side -  cramping at times and runs down my right leg Pain description: constant ache strong pain varies in intensity Aggravating factors: prolonged standing and walking Relieving factors: medicine, rest, ice, laying down, try stretches Goal status: INITIAL  3.  Pt will decrease 5TSTS by at least 3 seconds in order to demonstrate clinically significant improvement in LE strength. Baseline:  EVAL = 19.38 sec without UE Support Goal status: INITIAL  4.  Pt will increase by at least 19m (168ft) in order to demonstrate clinically significant improvement in cardiopulmonary endurance and community ambulation  Baseline: To be assessed visit #2; 05/01/2024= 980 feet without AD Goal status: INITIAL  5.  Patient will increase 10 meter walk test to >1.85m/s as to improve gait speed for better community ambulation and to reduce fall risk. Baseline: To be assessed visit 2; 04/27/2024= 0.84 ms without AD Goal status: INITIAL   PLAN:  PT FREQUENCY: 1-2x/week  PT DURATION: 12 weeks  PLANNED INTERVENTIONS: 97164- PT Re-evaluation, 97750- Physical Performance Testing, 97110-Therapeutic exercises, 97530- Therapeutic activity, [id number]- Neuromuscular re-education, 97535- Self Care, [id number]- Manual therapy, [id number]- Gait training, [phone]- Orthotic Initial, [phone]- Orthotic/Prosthetic subsequent, [phone]- Electrical stimulation (manual), [phone]- Traction (mechanical), [phone] (1-2 muscles), [phone] (3+ muscles)- Dry Needling, Patient/Family education, Balance training, Stair training, Taping, Joint mobilization, Joint manipulation, Spinal manipulation, Spinal mobilization, Scar mobilization, Compression bandaging, Vestibular training, DME instructions, Cryotherapy, and Moist heat.  PLAN FOR NEXT SESSION:  Progress core strengthening next visit.  Manual therapy for  ROM/pain relief NMR for postural awareness Body mechanics assessment and add goal if needed Progress HEP next visit   Reyes LOISE London, PT Physical Therapist [place] at [facility] [phone] 05/11/2024, 5:35 PM

## 2024-05-15 ENCOUNTER — Ambulatory Visit

## 2024-05-16 ENCOUNTER — Encounter: Payer: Self-pay | Admitting: Gastroenterology

## 2024-05-16 ENCOUNTER — Ambulatory Visit: Admitting: Gastroenterology

## 2024-05-16 VITALS — BP 90/50 | HR 104 | Ht 61.0 in | Wt 126.0 lb

## 2024-05-16 DIAGNOSIS — K5903 Drug induced constipation: Secondary | ICD-10-CM

## 2024-05-16 DIAGNOSIS — Z860100 Personal history of colon polyps, unspecified: Secondary | ICD-10-CM

## 2024-05-16 DIAGNOSIS — R1310 Dysphagia, unspecified: Secondary | ICD-10-CM | POA: Diagnosis not present

## 2024-05-16 MED ORDER — DICYCLOMINE HCL 20 MG PO TABS: 20.0000 mg | ORAL_TABLET | Freq: Four times a day (QID) | ORAL | 1 refills | Status: AC | PRN

## 2024-05-16 MED ORDER — NA SULFATE-K SULFATE-MG SULF 17.5-3.13-1.6 GM/177ML PO SOLN: 1.0000 | Freq: Once | ORAL | 0 refills | Status: AC

## 2024-05-16 NOTE — Patient Instructions (Addendum)
 Please purchase the following medications over the counter and take as directed: Metamucil daily.   We have sent the following medications to your pharmacy for you to pick up at your convenience: dicyclomine.  You have been scheduled for a colonoscopy. Please follow written instructions given to you at your visit today.   If you use inhalers (even only as needed), please bring them with you on the day of your procedure.  DO NOT TAKE 7 DAYS PRIOR TO TEST- Trulicity (dulaglutide) Ozempic, Wegovy (semaglutide) Mounjaro (tirzepatide) Bydureon Bcise (exanatide extended release)  DO NOT TAKE 1 DAY PRIOR TO YOUR TEST Rybelsus (semaglutide) Adlyxin (lixisenatide) Victoza (liraglutide) Byetta (exanatide) ___________________________________________________________________________   If your blood pressure at your visit was 140/90 or greater, please contact your primary care physician to follow up on this.  _______________________________________________________  If you are age 70 or older, your body mass index should be between 23-30. Your Body mass index is 23.81 kg/m. If this is out of the aforementioned range listed, please consider follow up with your Primary Care Provider.  If you are age 7 or younger, your body mass index should be between 19-25. Your Body mass index is 23.81 kg/m. If this is out of the aformentioned range listed, please consider follow up with your Primary Care Provider.   ________________________________________________________  The [facility] providers would like to encourage you to use MYCHART to communicate with providers for non-urgent requests or questions.  Due to long hold times on the telephone, sending your provider a message by [facility] may be a faster and more efficient way to get a response.  Please allow 48 business hours for a response.  Please remember that this is for non-urgent requests.   _______________________________________________________  [facility] is using a team-based approach to care.  Your team is made up of your doctor and two to three APPS. Our APPS (Nurse Practitioners and Physician Assistants) work with your physician to ensure care continuity for you. They are fully qualified to address your health concerns and develop a treatment plan. They communicate directly with your gastroenterologist to care for you. Seeing the Advanced Practice Practitioners on your physician's team can help you by facilitating care more promptly, often allowing for earlier appointments, access to diagnostic testing, procedures, and other specialty referrals.

## 2024-05-16 NOTE — Progress Notes (Signed)
 Discussed the use of AI scribe software for clinical note transcription with the patient, who gave verbal consent to proceed.  HPI : Robin Morgan is a 68 year old female with chronic pain/opioid dependence and opioid-induced constipation who presents for follow up of bowel issues.  She was last seen in our office in December 2024 by [facility] with symptoms of dysphagia, abdominal pain and constipation.  She was scheduled for a repeat EGD w/ dilation, but this was never completed.  A CT of the abdomen/pelvis was ordered and was unremarkable.  Today, she reports experiencing severe abdominal cramps and pain, primarily on the left side, which are triggered by bowel movements. Her bowel movements were initially regular but have become progressively looser, eventually resembling 'mud'. Bowel incontinence necessitates the use of pull-ups, as she often does not realize her bowels are loose until it is too late.  She has been taking Movantik  for about a year for constipation related to chronic morphine  use. Even at a reduced dose of 12.5 mg, Movantik  causes intense bowel movements and pain. She takes it every other day, but sometimes it does not work effectively. Prior to Movantik , she occasionally used stool softeners without significant relief.  Urinary incontinence is characterized by sudden urges to urinate that result in accidents before she can reach the bathroom. This issue has worsened over the past year.  She has a remote history of cholecystectomy which was followed by chronic diarrhea for 10-15 years, which resolved before then morphing into constipation in the setting of chronic opioid use.  She has a history of swallowing difficulties, requiring esophageal dilation in the past, which she says may have provided some relief.  She occasionally experiences issues with liquids and food not going down properly.  CT Abdomen/pelvis Dec 2024 IMPRESSION: 1. No acute abnormality in the  abdomen or pelvis. 2. Small to moderate volume of formed stool in the colon. 3. Status post cholecystectomy and hysterectomy.     Colonoscopy 2017 with 8 mm tubular adenoma   EGD 2019 unremarkable, empiric dilation to 18 mm   She was last seen by [facility] in 2022, and scheduled for a colonoscopy, but this procedure was not completed.   Past Medical History:  Diagnosis Date   Calcific tendonitis    Cervical spondylosis without myelopathy    Depression    Diabetes mellitus    Esophageal stricture    Fibromyalgia    GERD (gastroesophageal reflux disease)    Hiatal hernia    Hyperlipidemia    Hypertension      Past Surgical History:  Procedure Laterality Date   ABDOMINAL HYSTERECTOMY     1986   APPENDECTOMY     1983   BREAST EXCISIONAL BIOPSY Right 1990's   BREAST SURGERY     rt breast cyst done in the 90's   CHOLECYSTECTOMY     GALLBLADDER SURGERY     1983   ROTATOR CUFF REPAIR     right   SPINE SURGERY     TONSILLECTOMY     1973   Family History  Problem Relation Age of Onset   Alzheimer's disease Mother    Diabetes Father    Heart disease Maternal Grandmother    Breast cancer Paternal Grandmother 44   Social History   Tobacco Use   Smoking status: Never   Smokeless tobacco: Never  Vaping Use   Vaping status: Never Used  Substance Use Topics   Alcohol  use: No   Drug use:  No   Current Outpatient Medications  Medication Sig Dispense Refill   atorvastatin  (LIPITOR) 40 MG tablet TAKE ONE TABLET BY MOUTH ONCE A DAY 100 tablet 3   Calcium  Carbonate-Vitamin D  (CALCIUM -VITAMIN D ) 500-200 MG-UNIT per tablet Take 1 tablet by mouth 2 (two) times daily with a meal.     carboxymethylcellul-glycerin (OPTIVE) 0.5-0.9 % ophthalmic solution Apply to eye.     Cholecalciferol (VITAMIN D3) 50 MCG (2000 UT) CAPS Take 2 capsules by mouth daily.     co-enzyme Q-10 30 MG capsule Take 30 mg by mouth at bedtime.     diclofenac  Sodium (VOLTAREN ) 1 % GEL      DULoxetine   (CYMBALTA ) 30 MG capsule Take 3 capsules (90 mg total) by mouth daily. 270 capsule 3   esomeprazole  (NEXIUM ) 40 MG capsule TAKE ONE CAPSULE BY MOUTH TWO TIMES DAILY BEFORE A MEAL MORNING AND EVENING 180 capsule 1   FARXIGA  10 MG TABS tablet TAKE ONE TABLET (10 MG TOTAL) BY MOUTH DAILY. 90 tablet 0   fenofibrate  160 MG tablet TAKE ONE TABLET BY MOUTH ONCE A DAY 90 tablet 3   Glucose Blood (RELION GLUCOSE TEST STRIPS VI) by In Vitro route.     levothyroxine  (SYNTHROID ) 50 MCG tablet      levothyroxine  (SYNTHROID ) 88 MCG tablet TAKE ONE TAB BY MOUTH ONCE DAILY. TAKE ON AN EMPTY STOMACH WITH A GLASS OF WATER ATLEAST 30-60 MINUTES BEFORE BREAKFAST 90 tablet 3   lisinopril  (ZESTRIL ) 5 MG tablet 0.5 tablets (2.5 mg total).     metFORMIN (GLUCOPHAGE) 1000 MG tablet TAKE ONE TABLET BY MOUTH TWICE A DAY WITH MEALS 180 tablet 3   morphine  (MSIR) 15 MG tablet Take 1 tablet (15 mg total) by mouth 3 (three) times daily as needed for moderate pain. 80 tablet 0   Multiple Vitamins-Minerals (CENTRUM SILVER 50+WOMEN PO) Take by mouth.     naloxegol  oxalate (MOVANTIK ) 12.5 MG TABS tablet Take 1 tablet (12.5 mg total) by mouth daily. 30 tablet 2   RESTASIS 0.05 % ophthalmic emulsion Place 1 drop into both eyes daily.     topiramate  (TOPAMAX ) 100 MG tablet TAKE ONE TABLET BY MOUTH EVERY NIGHT AT BEDTIME 30 tablet 2   TRESIBA  FLEXTOUCH 200 UNIT/ML FlexTouch Pen INJECT 36 UNITS INTO THE SKIN TWO (TWO) TIMES DAILY. (Patient taking differently: Inject 24 Units into the skin in the morning and at bedtime.) 15 mL 5   vitamin B-12 (CYANOCOBALAMIN ) 1000 MCG tablet Take 1,000 mcg by mouth daily.     zolpidem  (AMBIEN ) 10 MG tablet TAKE ONE TABLET BY MOUTH EVERY NIGHT AT BEDTIME AS NEEDED FOR SLEEP 30 tablet 5   No current facility-administered medications for this visit.   Allergies  Allergen Reactions   Compazine Other (See Comments)    Seizure-like reaction   Prochlorperazine Other (See Comments)    Other reaction(s):  eye rolled back  Other Reaction(s): Unknown  Other Reaction(s): eye rolled back   Amoxicillin  Diarrhea    Exteme   Emetrol     Other reaction(s): GI upset   Other Other (See Comments)   Prochlorperazine Edisylate    Prochlorperazine Maleate    Naproxen Itching    Other reaction(s): itiching  Other Reaction(s): itiching     Review of Systems: All systems reviewed and negative except where noted in HPI.    DG Thoracic Spine W/Swimmers Result Date: 04/26/2024 CLINICAL DATA:  Upper back pain EXAM: THORACIC SPINE - 3 VIEWS COMPARISON:  04/06/2023 FINDINGS: Vertebral body height  is well maintained. Multilevel osteophytic changes are noted. No paraspinal mass is noted. Soft tissue abnormality is noted. IMPRESSION: Degenerative change without acute abnormality. Electronically Signed   By: Oneil Devonshire M.D.   On: 04/26/2024 02:36    Physical Exam: BP (!) 90/50 (BP Location: Left Arm, Patient Position: Sitting, Cuff Size: Normal)   Pulse (!) 104   Ht 5' 1 (1.549 m)   Wt 126 lb (57.2 kg)   BMI 23.81 kg/m  Constitutional: Pleasant,well-developed, Caucasian female in no acute distress. HEENT: Normocephalic and atraumatic. Conjunctivae are normal. No scleral icterus. Cardiovascular: Normal rate, regular rhythm.  Pulmonary/chest: Effort normal and breath sounds normal. No wheezing, rales or rhonchi. Abdominal: Soft, nondistended, nontender. Bowel sounds active throughout. There are no masses palpable. No hepatomegaly. Extremities: no edema Neurological: Alert and oriented to person place and time. Skin: Skin is warm and dry. No rashes noted. Psychiatric: Normal mood and affect. Behavior is normal.  CBC    Component Value Date/Time   WBC 9.1 10/25/2023 1516   RBC 4.64 10/25/2023 1516   HGB 12.8 10/25/2023 1516   HGB 13.3 09/22/2016 1013   HCT 40.7 10/25/2023 1516   HCT 41.2 09/22/2016 1013   PLT 368.0 10/25/2023 1516   PLT 419 (H) 09/22/2016 1013   MCV 87.7 10/25/2023 1516    MCV 86 09/22/2016 1013   MCH 26.1 (L) 05/27/2020 1200   MCHC 31.5 10/25/2023 1516   RDW 14.9 10/25/2023 1516   RDW 14.1 09/22/2016 1013   LYMPHSABS 3.1 10/25/2023 1516   MONOABS 0.7 10/25/2023 1516   EOSABS 0.2 10/25/2023 1516   BASOSABS 0.0 10/25/2023 1516    CMP     Component Value Date/Time   NA 141 10/25/2023 1516   NA 140 09/22/2016 1013   K 4.4 10/25/2023 1516   CL 109 10/25/2023 1516   CO2 21 10/25/2023 1516   GLUCOSE 175 (H) 10/25/2023 1516   BUN 16 10/25/2023 1516   BUN 18 09/22/2016 1013   CREATININE 1.08 10/25/2023 1516   CREATININE 0.86 05/27/2020 1200   CALCIUM  9.5 10/25/2023 1516   PROT 6.7 10/25/2023 1516   PROT 7.2 09/22/2016 1013   ALBUMIN 4.4 10/25/2023 1516   ALBUMIN 4.7 09/22/2016 1013   AST 23 10/25/2023 1516   ALT 13 10/25/2023 1516   ALKPHOS 53 10/25/2023 1516   BILITOT 0.3 10/25/2023 1516   BILITOT 0.4 09/22/2016 1013   GFRNONAA 70 05/27/2020 1200   GFRAA 82 05/27/2020 1200       Latest Ref Rng & Units 10/25/2023    3:16 PM 12/28/2022    2:24 PM 10/28/2022   11:25 AM  CBC EXTENDED  WBC 4.0 - 10.5 K/uL 9.1  10.0  7.0   RBC 3.87 - 5.11 Mil/uL 4.64  4.98  4.98   Hemoglobin 12.0 - 15.0 g/dL 87.1  86.4  86.3   HCT 36.0 - 46.0 % 40.7  43.6  43.3   Platelets 150.0 - 400.0 K/uL 368.0  378.0  328.0   NEUT# 1.4 - 7.7 K/uL 5.1  5.9  3.4   Lymph# 0.7 - 4.0 K/uL 3.1  3.1  2.8       ASSESSMENT AND PLAN:  68 year old female with chronic pain/opioid dependence with opioid-induced constipation, and likely opioid induced esophageal dysmotility   Opioid-induced constipation with crampy abdominal pain Chronic opioid-induced constipation with crampy abdominal pain, exacerbated by Movantik . Movantik  effective but causes spasms and loose stools. Previous antibiotics may have altered gut flora.  It does not  appear she was trialed on other laxatives prior to starting Movantik . - Recommend daily Metamucil for stool bulk and consistency. - Prescribe  dicyclomine 20 mg every six hours as needed for crampy pain, with 30 tablets and a refill. - Advise using Movantik  sparingly, only when no bowel movement for three days. - Follow up in three months to assess symptom management. - Can consider other agents such as Amitiza, given suboptimal response to Movantik .  Dysphagia Intermittent dysphagia with episodes of choking. Previous esophageal dilations provided some relief.  Suspect opioid use may contribute to esophageal dysmotility. Patient admitted that dilation did not provide any lasting or significant improvement in her dysphagia.  I do not recommend repeating an EGD with dilation given her normal EGD previously and unclear benefit. - Consider further esophageal motility testing if symptoms persist and are bothersome.  History of colon polyps Patient had a 8 mm polyp removed from ICV in 2017.  She was seen in 2022 and scheduled for colonoscopy, but this procedure was never completed.  When she was seen by us  in December 2024, it was thought that this procedure was complete, but the procedure report was not available.  We contacted [facility] gastroenterology after the patient's office visit today, and confirmed that she never did have this repeat colonoscopy. - Will schedule her for surveillance colonoscopy.  Recording duration: 22 minutes     Britnie Colville E. Stacia, MD [facility]    Jodie Lavern CROME, MD

## 2024-05-17 ENCOUNTER — Ambulatory Visit

## 2024-05-22 ENCOUNTER — Ambulatory Visit: Admitting: Physical Therapy

## 2024-05-24 ENCOUNTER — Ambulatory Visit: Admitting: Physical Therapy

## 2024-05-31 ENCOUNTER — Ambulatory Visit: Admitting: Physical Therapy

## 2024-06-01 ENCOUNTER — Other Ambulatory Visit: Payer: Self-pay

## 2024-06-02 ENCOUNTER — Ambulatory Visit

## 2024-06-05 ENCOUNTER — Ambulatory Visit: Admitting: Physical Therapy

## 2024-06-05 ENCOUNTER — Other Ambulatory Visit: Payer: Self-pay

## 2024-06-05 ENCOUNTER — Other Ambulatory Visit: Payer: Self-pay | Admitting: Registered Nurse

## 2024-06-06 ENCOUNTER — Other Ambulatory Visit: Payer: Self-pay

## 2024-06-06 ENCOUNTER — Telehealth: Payer: Self-pay | Admitting: Registered Nurse

## 2024-06-06 MED ORDER — MORPHINE SULFATE 15 MG PO TABS: 15.0000 mg | ORAL_TABLET | Freq: Three times a day (TID) | ORAL | 0 refills | Status: DC | PRN

## 2024-06-06 NOTE — Telephone Encounter (Signed)
 PMP was Reviewed.  MSIR e-scribed to pharmacy.

## 2024-06-07 ENCOUNTER — Telehealth: Payer: Self-pay | Admitting: Registered Nurse

## 2024-06-07 ENCOUNTER — Other Ambulatory Visit: Payer: Self-pay

## 2024-06-07 ENCOUNTER — Ambulatory Visit: Admitting: Physical Therapy

## 2024-06-07 MED ORDER — MORPHINE SULFATE 15 MG PO TABS
15.0000 mg | ORAL_TABLET | Freq: Three times a day (TID) | ORAL | 0 refills | Status: DC | PRN
  Filled 2024-06-07: qty 9, 3d supply, fill #0
  Filled 2024-06-07: qty 71, 24d supply, fill #0

## 2024-06-07 NOTE — Addendum Note (Signed)
 Addended by: Nohealani Medinger M on: 06/07/2024 08:21 AM   Modules accepted: Orders

## 2024-06-07 NOTE — Telephone Encounter (Signed)
 PDMP reviewed.  MSIR e-scribed to pharmacy

## 2024-06-07 NOTE — Telephone Encounter (Signed)
 Please re-send Morphine  15 MR IR to [facility]. Prior Rx cancelled.

## 2024-06-08 ENCOUNTER — Encounter: Payer: Self-pay | Admitting: Gastroenterology

## 2024-06-12 ENCOUNTER — Ambulatory Visit: Admitting: Physical Therapy

## 2024-06-12 ENCOUNTER — Other Ambulatory Visit: Payer: Self-pay | Admitting: Family Medicine

## 2024-06-14 ENCOUNTER — Ambulatory Visit: Admitting: Physical Therapy

## 2024-06-15 ENCOUNTER — Encounter: Payer: Self-pay | Admitting: Gastroenterology

## 2024-06-15 ENCOUNTER — Ambulatory Visit: Admitting: Gastroenterology

## 2024-06-15 VITALS — BP 155/83 | HR 102 | Temp 97.3°F | Resp 17 | Ht 61.0 in | Wt 126.0 lb

## 2024-06-15 DIAGNOSIS — Z860101 Personal history of adenomatous and serrated colon polyps: Secondary | ICD-10-CM

## 2024-06-15 DIAGNOSIS — Z860100 Personal history of colon polyps, unspecified: Secondary | ICD-10-CM

## 2024-06-15 DIAGNOSIS — Z1211 Encounter for screening for malignant neoplasm of colon: Secondary | ICD-10-CM

## 2024-06-15 MED ORDER — SODIUM CHLORIDE 0.9 % IV SOLN: 500.0000 mL | INTRAVENOUS | Status: DC

## 2024-06-15 NOTE — Progress Notes (Unsigned)
 Sedate, gd SR, tolerated procedure well, VSS, report to RN

## 2024-06-15 NOTE — Patient Instructions (Signed)
  Resume previous diet  Continue present medications  Consider repeat colonoscopy in 7-10 years for surveillance based on your health at the time.   YOU HAD AN ENDOSCOPIC PROCEDURE TODAY AT THE [facility]:   Refer to the procedure report that was given to you for any specific questions about what was found during the examination.  If the procedure report does not answer your questions, please call your gastroenterologist to clarify.  If you requested that your care partner not be given the details of your procedure findings, then the procedure report has been included in a sealed envelope for you to review at your convenience later.  YOU SHOULD EXPECT: Some feelings of bloating in the abdomen. Passage of more gas than usual.  Walking can help get rid of the air that was put into your GI tract during the procedure and reduce the bloating. If you had a lower endoscopy (such as a colonoscopy or flexible sigmoidoscopy) you may notice spotting of blood in your stool or on the toilet paper. If you underwent a bowel prep for your procedure, you may not have a normal bowel movement for a few days.  Please Note:  You might notice some irritation and congestion in your nose or some drainage.  This is from the oxygen used during your procedure.  There is no need for concern and it should clear up in a day or so.  SYMPTOMS TO REPORT IMMEDIATELY:  Following lower endoscopy (colonoscopy or flexible sigmoidoscopy):  Excessive amounts of blood in the stool  Significant tenderness or worsening of abdominal pains  Swelling of the abdomen that is new, acute  Fever of 100F or higher  For urgent or emergent issues, a gastroenterologist can be reached at any hour by calling [phone]. Do not use MyChart messaging for urgent concerns.    DIET:  We do recommend a small meal at first, but then you may proceed to your regular diet.  Drink plenty of fluids but you should avoid alcoholic beverages  for 24 hours.  ACTIVITY:  You should plan to take it easy for the rest of today and you should NOT DRIVE or use heavy machinery until tomorrow (because of the sedation medicines used during the test).    FOLLOW UP: Our staff will call the number listed on your records the next business day following your procedure.  We will call around 7:15- 8:00 am to check on you and address any questions or concerns that you may have regarding the information given to you following your procedure. If we do not reach you, we will leave a message.     If any biopsies were taken you will be contacted by phone or by letter within the next 1-3 weeks.  Please call us  at [phone] if you have not heard about the biopsies in 3 weeks.    SIGNATURES/CONFIDENTIALITY: You and/or your care partner have signed paperwork which will be entered into your electronic medical record.  These signatures attest to the fact that that the information above on your After Visit Summary has been reviewed and is understood.  Full responsibility of the confidentiality of this discharge information lies with you and/or your care-partner.

## 2024-06-15 NOTE — Op Note (Signed)
 [facility] Patient Name: Robin Morgan Procedure Date: 06/15/2024 12:28 PM MRN: [id number] Endoscopist: Glendia E. Stacia , MD, [id number] Age: 68 Referring MD:  Date of Birth: 11-30-1955 Gender: Female Account #: [id number] Procedure:                Colonoscopy Indications:              Surveillance: Personal history of adenomatous                            polyps on last colonoscopy > 5 years ago (7 mm TA                            on ICV in 2018 at [facility]) Medicines:                Monitored Anesthesia Care Procedure:                Pre-Anesthesia Assessment:                           - Prior to the procedure, a History and Physical                            was performed, and patient medications and                            allergies were reviewed. The patient's tolerance of                            previous anesthesia was also reviewed. The risks                            and benefits of the procedure and the sedation                            options and risks were discussed with the patient.                            All questions were answered, and informed consent                            was obtained. Prior Anticoagulants: The patient has                            taken no anticoagulant or antiplatelet agents. ASA                            Grade Assessment: III - A patient with severe                            systemic disease. After reviewing the risks and                            benefits, the patient was deemed in satisfactory  condition to undergo the procedure.                           After obtaining informed consent, the colonoscope                            was passed under direct vision. Throughout the                            procedure, the patient's blood pressure, pulse, and                            oxygen saturations were monitored continuously. The                            [id number]-[id number] Colonoscope  [id number] was introduced                            through the anus and advanced to the the cecum,                            identified by appendiceal orifice and ileocecal                            valve. The colonoscopy was somewhat difficult due                            to a redundant colon and a tortuous colon.                            Successful completion of the procedure was aided by                            using manual pressure. The patient tolerated the                            procedure well. The quality of the bowel                            preparation was good. The ileocecal valve,                            appendiceal orifice, and rectum were photographed.                            The bowel preparation used was SUPREP via split                            dose instruction. Scope In: 1:31:04 PM Scope Out: 1:50:35 PM Scope Withdrawal Time: 0 hours 13 minutes 25 seconds  Total Procedure Duration: 0 hours 19 minutes 31 seconds  Findings:                 The perianal and digital rectal examinations were  normal. Pertinent negatives include normal                            sphincter tone and no palpable rectal lesions.                           The colon (entire examined portion) appeared normal.                           The retroflexed view of the distal rectum and anal                            verge was normal and showed no anal or rectal                            abnormalities. Complications:            No immediate complications. Estimated Blood Loss:     Estimated blood loss: none. Impression:               - The entire examined colon is normal.                           - The distal rectum and anal verge are normal on                            retroflexion view.                           - No specimens collected. Recommendation:           - Patient has a contact number available for                            emergencies. The  signs and symptoms of potential                            delayed complications were discussed with the                            patient. Return to normal activities tomorrow.                            Written discharge instructions were provided to the                            patient.                           - Resume previous diet.                           - Continue present medications.                           - Consider repeat colonoscopy in 7-10 years for  surveillance based on patient's health at that time. Zaylin Pistilli E. Stacia, MD 06/15/2024 1:57:29 PM This report has been signed electronically.

## 2024-06-15 NOTE — Progress Notes (Unsigned)
 Pt's states no medical or surgical changes since previsit or office visit.

## 2024-06-15 NOTE — Progress Notes (Unsigned)
 History and Physical Interval Note:  06/15/2024 1:25 PM  Robin Morgan  has presented today for endoscopic procedure(s), with the diagnosis of  Encounter Diagnosis  Name Primary?   History of colonic polyps Yes  .  The various methods of evaluation and treatment have been discussed with the patient and/or family. After consideration of risks, benefits and other options for treatment, the patient has consented to  the endoscopic procedure(s).   The patient's history has been reviewed, patient examined, no change in status, stable for endoscopic procedure(s).  I have reviewed the patient's chart and labs.  Questions were answered to the patient's satisfaction.     Icelynn Onken E. Stacia, MD [facility]

## 2024-06-16 ENCOUNTER — Telehealth: Payer: Self-pay

## 2024-06-16 NOTE — Telephone Encounter (Signed)
 Attempted to reach patient for follow up phone call. No answer, left voicemail to contact Dr. Milus Alpha office with any questions or concerns.

## 2024-06-19 ENCOUNTER — Ambulatory Visit: Admitting: Physical Therapy

## 2024-06-21 ENCOUNTER — Ambulatory Visit: Admitting: Physical Therapy

## 2024-06-22 ENCOUNTER — Encounter: Attending: Registered Nurse | Admitting: Registered Nurse

## 2024-06-22 ENCOUNTER — Other Ambulatory Visit: Payer: Self-pay

## 2024-06-22 ENCOUNTER — Encounter: Payer: Self-pay | Admitting: Registered Nurse

## 2024-06-22 VITALS — BP 102/67 | HR 92 | Ht 61.0 in | Wt 126.6 lb

## 2024-06-22 DIAGNOSIS — Z5181 Encounter for therapeutic drug level monitoring: Secondary | ICD-10-CM | POA: Diagnosis present

## 2024-06-22 DIAGNOSIS — G8929 Other chronic pain: Secondary | ICD-10-CM | POA: Insufficient documentation

## 2024-06-22 DIAGNOSIS — M797 Fibromyalgia: Secondary | ICD-10-CM | POA: Diagnosis present

## 2024-06-22 DIAGNOSIS — M545 Low back pain, unspecified: Secondary | ICD-10-CM | POA: Diagnosis present

## 2024-06-22 DIAGNOSIS — M25512 Pain in left shoulder: Secondary | ICD-10-CM | POA: Diagnosis present

## 2024-06-22 DIAGNOSIS — M7918 Myalgia, other site: Secondary | ICD-10-CM | POA: Diagnosis present

## 2024-06-22 DIAGNOSIS — Z79891 Long term (current) use of opiate analgesic: Secondary | ICD-10-CM | POA: Diagnosis present

## 2024-06-22 DIAGNOSIS — M25511 Pain in right shoulder: Secondary | ICD-10-CM | POA: Diagnosis present

## 2024-06-22 DIAGNOSIS — G894 Chronic pain syndrome: Secondary | ICD-10-CM | POA: Diagnosis present

## 2024-06-22 MED ORDER — MORPHINE SULFATE 15 MG PO TABS
15.0000 mg | ORAL_TABLET | Freq: Three times a day (TID) | ORAL | 0 refills | Status: DC | PRN
  Filled 2024-06-22: qty 80, 27d supply, fill #0

## 2024-06-22 NOTE — Progress Notes (Signed)
 Subjective:    Patient ID: Robin Morgan, female    DOB: [birth year], 68 y.o.   MRN: [id number]  HPI: Robin Morgan is a 68 y.o. female who returns for follow up appointment for chronic pain and medication refill. She states her pain is located in her bilateral shoulders and lower back pain. She rates her pain 10. Her current exercise regime is walking and performing stretching exercises.  [name] Morphine  equivalent is 44.38 MME.   Last Oral Swab was Performed on 04/12/2024, it was consistent.     Pain Inventory Average Pain 8 Pain Right Now 10 My pain is constant, sharp, and aching  In the last 24 hours, has pain interfered with the following? General activity 7 Relation with others 7 Enjoyment of life 7 What TIME of day is your pain at its worst? morning , evening, and night Sleep (in general) Poor  Pain is worse with: walking, bending, and sitting Pain improves with: rest and medication Relief from Meds: na  Family History  Problem Relation Age of Onset   Alzheimer's disease Mother    Diabetes Father    Heart disease Maternal Grandmother    Breast cancer Paternal Grandmother 28   Colon cancer Neg Hx    Esophageal cancer Neg Hx    Rectal cancer Neg Hx    Stomach cancer Neg Hx    Social History   Socioeconomic History   Marital status: Divorced    Spouse name: Not on file   Number of children: 1   Years of education: Not on file   Highest education level: Not on file  Occupational History   Occupation: disabled  Tobacco Use   Smoking status: Never   Smokeless tobacco: Never  Vaping Use   Vaping status: Never Used  Substance and Sexual Activity   Alcohol  use: No   Drug use: No   Sexual activity: Not Currently  Other Topics Concern   Not on file  Social History Narrative   Not on file   Social Drivers of Health   Financial Resource Strain: Low Risk  (04/19/2024)   Overall Financial Resource Strain (CARDIA)    Difficulty of Paying Living  Expenses: Not hard at all  Food Insecurity: No Food Insecurity (04/19/2024)   Hunger Vital Sign    Worried About Running Out of Food in the Last Year: Never true    Ran Out of Food in the Last Year: Never true  Transportation Needs: No Transportation Needs (04/19/2024)   PRAPARE - Administrator, Civil Service (Medical): No    Lack of Transportation (Non-Medical): No  Physical Activity: Inactive (04/19/2024)   Exercise Vital Sign    Days of Exercise per Week: 0 days    Minutes of Exercise per Session: 0 min  Stress: No Stress Concern Present (04/19/2024)   [employer] of Occupational Health - Occupational Stress Questionnaire    Feeling of Stress: Only a little  Social Connections: Moderately Isolated (04/19/2024)   Social Connection and Isolation Panel    Frequency of Communication with Friends and Family: More than three times a week    Frequency of Social Gatherings with Friends and Family: More than three times a week    Attends Religious Services: 1 to 4 times per year    Active Member of [employer] or Organizations: No    Attends Banker Meetings: Never    Marital Status: Divorced   Past Surgical History:  Procedure Laterality Date  ABDOMINAL HYSTERECTOMY     1986   APPENDECTOMY     1983   BREAST EXCISIONAL BIOPSY Right 1990's   BREAST SURGERY     rt breast cyst done in the 90's   CHOLECYSTECTOMY     GALLBLADDER SURGERY     1983   ROTATOR CUFF REPAIR     right   SPINE SURGERY     TONSILLECTOMY     1973   Past Surgical History:  Procedure Laterality Date   ABDOMINAL HYSTERECTOMY     1986   APPENDECTOMY     1983   BREAST EXCISIONAL BIOPSY Right 1990's   BREAST SURGERY     rt breast cyst done in the 90's   CHOLECYSTECTOMY     GALLBLADDER SURGERY     1983   ROTATOR CUFF REPAIR     right   SPINE SURGERY     TONSILLECTOMY     1973   Past Medical History:  Diagnosis Date   Calcific tendonitis    Cervical spondylosis without  myelopathy    Depression    Diabetes mellitus    Esophageal stricture    Fibromyalgia    GERD (gastroesophageal reflux disease)    Hiatal hernia    Hyperlipidemia    Hypertension    Sleep apnea    BP 102/67   Pulse 92   Ht 5' 1 (1.549 m)   Wt 126 lb 9.6 oz (57.4 kg)   SpO2 96%   BMI 23.92 kg/m   Opioid Risk Score:   Fall Risk Score:  `1  Depression screen PHQ 2/9     06/22/2024    1:07 PM 05/03/2024    1:42 PM 04/19/2024    3:19 PM 04/12/2024   11:29 AM 02/03/2024    1:44 PM 12/02/2023    1:27 PM 10/06/2023    2:40 PM  Depression screen PHQ 2/9  Decreased Interest 0 0 0 1 1 3  0  Down, Depressed, Hopeless 0 0 1 1 1 3  0  PHQ - 2 Score 0 0 1 2 2 6  0  Altered sleeping  0     0  Tired, decreased energy  0     0  Change in appetite  0     0  Feeling bad or failure about yourself   0     0  Trouble concentrating  0     0  Moving slowly or fidgety/restless  0     0  Suicidal thoughts  0     0  PHQ-9 Score  0      0   Difficult doing work/chores  Not difficult at all     Not difficult at all     Data saved with a previous flowsheet row definition     Review of Systems  Musculoskeletal:  Positive for neck pain.       Bilateral arms  All other systems reviewed and are negative.      Objective:   Physical Exam Vitals and nursing note reviewed.  Constitutional:      Appearance: Normal appearance.  Neck:     Comments: Cervical Paraspinal Tenderness: C-5-C-6 Cardiovascular:     Rate and Rhythm: Normal rate and regular rhythm.     Pulses: Normal pulses.     Heart sounds: Normal heart sounds.  Pulmonary:     Effort: Pulmonary effort is normal.     Breath sounds: Normal breath sounds.  Musculoskeletal:     Comments:  Normal Muscle Bulk and Muscle Testing Reveals:  Upper Extremities: Full ROM and Muscle Strength 5/5 Bilateral AC Joint Tenderness Thoracic Paraspinal Tenderness: T-1- T-4 Mainly Right side   Lumbar Paraspinal Tenderness: L-4-L-5 Lower Extremities :  Full ROM and Muscle Strength 5/5 Right Lower Extremity Flexion Produces Pain into her Right Hip Left Lower Extremity: Full ROM and Muscle Strength 5/5 Arises from chair slowly Narrow Based Gait     Skin:    General: Skin is warm and dry.  Neurological:     Mental Status: She is alert and oriented to person, place, and time.  Psychiatric:        Mood and Affect: Mood normal.        Behavior: Behavior normal.          Assessment & Plan:  1. Fibromyalgia with myofascial pain: Continue home exercise program and heat therapy. Continue current medication regimen with Flexeril . 06/22/2024.  2. Rotator cuff syndrome on the right: Continue with stretching exercises and heat therapy. 06/22/2024 3. Cervicalgia. Post-laminectomy syndrome, facet arthropathy: Cervical Radiculopathy: Continue current medication regimen with Gabapentin . 06/22/2024  S/ P  EMG with Dr. Babs on 03/08/2017: Diagnosed with Moderate to Severe CTS of Left wrist: 06/22/2024. 4. Depression: Continue current medication regimen with Cymbalta . PCP Following. Current  dose 90 mg daily. 06/22/2024 5. Mid/low back pain/ Lumbar Spondylosis: Continue Current Medication and stretching and heat therapy. 06/22/2024 Refilled:  Morphine  Sulfate IR 15 mg one tablet three times  a day as needed for pain # 80.  6. Lumbar Radiculopathy:  Continue current medication regimen with Gabapentin . 06/22/2024 7. OA of right hand: Continue Voltaren  gel/ May substitute with Voltaren  Tablet, she realizes she can't use both and verbalizes understanding. Continue with  heat therapy. 06/22/2024 8. Migraines: Continue with headache Journal.  Continue to Monitor  06/22/2024 9. Left CTS:S/P EMG on 03/08/2017: S/P Carpal Tunnel Release on 06/14/2017 via . Dr. Ortman. 06/22/2024 10.. Muscle Spasm: Continue Tizanidine   Continue to Monitor. 06/22/2024 11. Chronic Bilateral Shoulder Pain:Ortho Following: Her scheduled  surgery is pending at this time.  Continue  HEP as Tolerated. Continue to Monitor. 06/22/2024  12. Fall at Home: No falls since last visit. Continue to Monitor. 06/22/2024      F/U in 2 months

## 2024-06-26 ENCOUNTER — Ambulatory Visit

## 2024-06-28 ENCOUNTER — Ambulatory Visit

## 2024-07-03 ENCOUNTER — Ambulatory Visit

## 2024-07-05 ENCOUNTER — Ambulatory Visit

## 2024-07-10 ENCOUNTER — Telehealth: Payer: Self-pay

## 2024-07-10 ENCOUNTER — Ambulatory Visit

## 2024-07-10 NOTE — Telephone Encounter (Signed)
 Copied from CRM #[id number]. Topic: Clinical - Prescription Issue >> Jul 10, 2024 11:04 AM Avram MATSU wrote: Reason for CRM:Loc is calling from [facility] and stated patient was taking tresiba  and needs to switch whatever is appropriate Lantuss or pojeo.   [facility] - [place], [place] - [address] [place] [address] Phone: [phone] Fax: [phone]  Please advise [phone]

## 2024-07-12 ENCOUNTER — Ambulatory Visit: Admitting: Physical Therapy

## 2024-07-12 ENCOUNTER — Other Ambulatory Visit: Payer: Self-pay | Admitting: Registered Nurse

## 2024-07-12 DIAGNOSIS — G8929 Other chronic pain: Secondary | ICD-10-CM

## 2024-07-12 DIAGNOSIS — G894 Chronic pain syndrome: Secondary | ICD-10-CM

## 2024-07-12 MED ORDER — MORPHINE SULFATE 15 MG PO TABS: 15.0000 mg | ORAL_TABLET | Freq: Three times a day (TID) | ORAL | 0 refills | Status: DC | PRN

## 2024-07-12 NOTE — Telephone Encounter (Signed)
 Requested Prescriptions   Pending Prescriptions Disp Refills   morphine  (MSIR) 15 MG tablet 80 tablet 0    Sig: Take 1 tablet (15 mg total) by mouth 3 (three) times daily as needed for moderate pain.     Date of patient request: 07/12/2024 Last office visit: 06/22/2024 Upcoming visit: 08/31/2024 Date of last refill: 06/22/2024 Last refill amount: #80 0 refills

## 2024-07-12 NOTE — Telephone Encounter (Signed)
 Meds refill morthine 15 mg tab

## 2024-07-13 ENCOUNTER — Other Ambulatory Visit: Payer: Self-pay | Admitting: Registered Nurse

## 2024-07-13 DIAGNOSIS — G43019 Migraine without aura, intractable, without status migrainosus: Secondary | ICD-10-CM

## 2024-07-17 ENCOUNTER — Ambulatory Visit

## 2024-07-17 ENCOUNTER — Ambulatory Visit: Admitting: Physical Therapy

## 2024-07-18 ENCOUNTER — Other Ambulatory Visit: Payer: Self-pay

## 2024-07-18 ENCOUNTER — Other Ambulatory Visit: Payer: Self-pay | Admitting: Registered Nurse

## 2024-07-18 DIAGNOSIS — G8929 Other chronic pain: Secondary | ICD-10-CM

## 2024-07-18 DIAGNOSIS — G894 Chronic pain syndrome: Secondary | ICD-10-CM

## 2024-07-18 DIAGNOSIS — M545 Low back pain, unspecified: Secondary | ICD-10-CM

## 2024-07-19 ENCOUNTER — Other Ambulatory Visit: Payer: Self-pay

## 2024-07-19 ENCOUNTER — Ambulatory Visit

## 2024-07-19 ENCOUNTER — Encounter: Payer: Self-pay | Admitting: Psychology

## 2024-07-19 ENCOUNTER — Other Ambulatory Visit: Payer: Self-pay | Admitting: Registered Nurse

## 2024-07-19 ENCOUNTER — Encounter: Attending: Registered Nurse | Admitting: Psychology

## 2024-07-19 DIAGNOSIS — M25512 Pain in left shoulder: Secondary | ICD-10-CM | POA: Insufficient documentation

## 2024-07-19 DIAGNOSIS — F339 Major depressive disorder, recurrent, unspecified: Secondary | ICD-10-CM | POA: Insufficient documentation

## 2024-07-19 DIAGNOSIS — M25511 Pain in right shoulder: Secondary | ICD-10-CM | POA: Insufficient documentation

## 2024-07-19 DIAGNOSIS — G894 Chronic pain syndrome: Secondary | ICD-10-CM

## 2024-07-19 DIAGNOSIS — G8929 Other chronic pain: Secondary | ICD-10-CM

## 2024-07-19 DIAGNOSIS — M797 Fibromyalgia: Secondary | ICD-10-CM | POA: Insufficient documentation

## 2024-07-19 DIAGNOSIS — M545 Low back pain, unspecified: Secondary | ICD-10-CM | POA: Insufficient documentation
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
## 2024-07-20 ENCOUNTER — Ambulatory Visit

## 2024-07-20 ENCOUNTER — Other Ambulatory Visit: Payer: Self-pay

## 2024-07-24 ENCOUNTER — Ambulatory Visit

## 2024-07-26 ENCOUNTER — Ambulatory Visit: Admitting: Physical Therapy

## 2024-07-26 ENCOUNTER — Ambulatory Visit

## 2024-07-31 ENCOUNTER — Ambulatory Visit

## 2024-08-02 ENCOUNTER — Other Ambulatory Visit: Payer: Self-pay | Admitting: Family Medicine

## 2024-08-02 MED ORDER — LANTUS SOLOSTAR 100 UNIT/ML ~~LOC~~ SOPN: 24.0000 [IU] | PEN_INJECTOR | Freq: Two times a day (BID) | SUBCUTANEOUS | 3 refills | Status: AC

## 2024-08-02 NOTE — Telephone Encounter (Signed)
 Ordered lantus  to pharmacy

## 2024-08-09 ENCOUNTER — Ambulatory Visit

## 2024-08-11 ENCOUNTER — Other Ambulatory Visit: Payer: Self-pay | Admitting: Physical Medicine & Rehabilitation

## 2024-08-11 DIAGNOSIS — G894 Chronic pain syndrome: Secondary | ICD-10-CM

## 2024-08-11 DIAGNOSIS — G8929 Other chronic pain: Secondary | ICD-10-CM

## 2024-08-11 NOTE — Telephone Encounter (Signed)
 PDMP was Reviewed.  MSIR e-scribed to pharmacy.  Ms. Needle is aware via My-Chart

## 2024-08-14 NOTE — Progress Notes (Signed)
 LEATTA ALEWINE                                          MRN: [id number]   08/14/2024   The [facility] Quality Team Specialist reviewed this patient medical record for the purposes of chart review for care gap closure. The following were reviewed: abstraction for care gap closure-glycemic status assessment.    [facility] Quality Team

## 2024-08-16 ENCOUNTER — Other Ambulatory Visit: Payer: Self-pay | Admitting: Family Medicine

## 2024-08-16 NOTE — Telephone Encounter (Signed)
 05/03/2024 LOV  08/05/2023 fill date  30/5 refills

## 2024-08-31 ENCOUNTER — Encounter: Attending: Registered Nurse | Admitting: Registered Nurse

## 2024-08-31 ENCOUNTER — Encounter: Payer: Self-pay | Admitting: Registered Nurse

## 2024-08-31 VITALS — BP 123/56 | HR 95 | Ht 61.0 in | Wt 130.0 lb

## 2024-08-31 DIAGNOSIS — G43019 Migraine without aura, intractable, without status migrainosus: Secondary | ICD-10-CM | POA: Diagnosis present

## 2024-08-31 DIAGNOSIS — Z5181 Encounter for therapeutic drug level monitoring: Secondary | ICD-10-CM | POA: Insufficient documentation

## 2024-08-31 DIAGNOSIS — Z79891 Long term (current) use of opiate analgesic: Secondary | ICD-10-CM | POA: Diagnosis not present

## 2024-08-31 DIAGNOSIS — R32 Unspecified urinary incontinence: Secondary | ICD-10-CM | POA: Diagnosis not present

## 2024-08-31 DIAGNOSIS — M5416 Radiculopathy, lumbar region: Secondary | ICD-10-CM | POA: Diagnosis not present

## 2024-08-31 DIAGNOSIS — M961 Postlaminectomy syndrome, not elsewhere classified: Secondary | ICD-10-CM | POA: Diagnosis present

## 2024-08-31 DIAGNOSIS — M542 Cervicalgia: Secondary | ICD-10-CM | POA: Diagnosis not present

## 2024-08-31 DIAGNOSIS — M75102 Unspecified rotator cuff tear or rupture of left shoulder, not specified as traumatic: Secondary | ICD-10-CM | POA: Insufficient documentation

## 2024-08-31 DIAGNOSIS — M546 Pain in thoracic spine: Secondary | ICD-10-CM | POA: Diagnosis not present

## 2024-08-31 DIAGNOSIS — G894 Chronic pain syndrome: Secondary | ICD-10-CM | POA: Insufficient documentation

## 2024-08-31 DIAGNOSIS — M5412 Radiculopathy, cervical region: Secondary | ICD-10-CM | POA: Diagnosis not present

## 2024-08-31 DIAGNOSIS — G8929 Other chronic pain: Secondary | ICD-10-CM | POA: Insufficient documentation

## 2024-08-31 DIAGNOSIS — M797 Fibromyalgia: Secondary | ICD-10-CM | POA: Insufficient documentation

## 2024-08-31 DIAGNOSIS — M7918 Myalgia, other site: Secondary | ICD-10-CM | POA: Insufficient documentation

## 2024-08-31 MED ORDER — MORPHINE SULFATE 15 MG PO TABS: 15.0000 mg | ORAL_TABLET | Freq: Three times a day (TID) | ORAL | 0 refills | Status: AC | PRN

## 2024-08-31 MED ORDER — BACLOFEN 5 MG PO TABS: 1.0000 | ORAL_TABLET | Freq: Two times a day (BID) | ORAL | 1 refills | Status: AC | PRN

## 2024-08-31 NOTE — Patient Instructions (Signed)
 Only Take the Baclofen  at bedtime for the next week, please send a My- Chart message with Update, within 1- 2 weeks   Or call the office : [phone]  If you develop any side effect stop medication immediately and call the office

## 2024-08-31 NOTE — Progress Notes (Signed)
 "  Subjective:    Patient ID: Robin Morgan, female    DOB: October 18, 1955, 69 y.o.   MRN: [id number]  HPI: Robin Morgan is a 69 y.o. female who returns for follow up appointment for chronic pain and medication refill. She states her pain is located in her neck radiating into her left shouldrer, also reports left shoulder pain, mid- lower back pain radiating into her right lower extremity. She also reports right hip pain. She rates her pain 9. Her current exercise regime is walking and performing stretching exercises.  Robin Morgan also reports urinary incontinence for the last 4-5 months, she denies any weakness in her bilateral lower extremities, she states she will be calling  her Urologist at [facility] to schedule an appointment.  Robin Morgan last Lumbar Xray was on 03/2023, discussed with Dr Carilyn in the office today. He agreed with Urology visit, and she will be  scheduled with Dr Babs, she verbalizes understanding.  SABRA She was instructed to call [facility] to schedule an appointment, she verbalizes understanding. She will call office with update, she verbalizes understanding.   Robin Morgan was called she states has a scheduled appointment with [facility] on 09/05/2023 at 1:00 P.M.   Robin Morgan Morgan  equivalent is 46.15 MME.   Oral Swab was Performed today.    Pain Inventory Average Pain 9 Pain Right Now 9 My pain is constant, sharp, stabbing, and aching  In the last 24 hours, has pain interfered with the following? General activity 8 Relation with others 9 Enjoyment of life 10 What TIME of day is your pain at its worst? evening and night Sleep (in general) Poor  Pain is worse with: bending and standing Pain improves with: medication Relief from Meds: 9  Family History  Problem Relation Age of Onset   Alzheimer's disease Mother    Diabetes Father    Heart disease Maternal Grandmother    Breast cancer Paternal Grandmother 33   Colon cancer Neg Hx     Esophageal cancer Neg Hx    Rectal cancer Neg Hx    Stomach cancer Neg Hx    Social History   Socioeconomic History   Marital status: Divorced    Spouse name: Not on file   Number of children: 1   Years of education: Not on file   Highest education level: Not on file  Occupational History   Occupation: disabled  Tobacco Use   Smoking status: Never   Smokeless tobacco: Never  Vaping Use   Vaping status: Never Used  Substance and Sexual Activity   Alcohol  use: No   Drug use: No   Sexual activity: Not Currently  Other Topics Concern   Not on file  Social History Narrative   Not on file   Social Drivers of Health   Tobacco Use: Low Risk (07/19/2024)   Patient History    Smoking Tobacco Use: Never    Smokeless Tobacco Use: Never    Passive Exposure: Not on file  Financial Resource Strain: Low Risk (04/19/2024)   Overall Financial Resource Strain (CARDIA)    Difficulty of Paying Living Expenses: Not hard at all  Food Insecurity: No Food Insecurity (04/19/2024)   Epic    Worried About Radiation Protection Practitioner of Food in the Last Year: Never true    Ran Out of Food in the Last Year: Never true  Transportation Needs: No Transportation Needs (04/19/2024)   Epic    Lack of Transportation (Medical): No  Lack of Transportation (Non-Medical): No  Physical Activity: Inactive (04/19/2024)   Exercise Vital Sign    Days of Exercise per Week: 0 days    Minutes of Exercise per Session: 0 min  Stress: No Stress Concern Present (04/19/2024)   [employer] of Occupational Health - Occupational Stress Questionnaire    Feeling of Stress: Only a little  Social Connections: Moderately Isolated (04/19/2024)   Social Connection and Isolation Panel    Frequency of Communication with Friends and Family: More than three times a week    Frequency of Social Gatherings with Friends and Family: More than three times a week    Attends Religious Services: 1 to 4 times per year    Active Member of Clubs or  Organizations: No    Attends Banker Meetings: Never    Marital Status: Divorced  Depression (PHQ2-9): Low Risk (06/22/2024)   Depression (PHQ2-9)    PHQ-2 Score: 0  Alcohol  Screen: Low Risk (04/19/2024)   Alcohol  Screen    Last Alcohol  Screening Score (AUDIT): 0  Housing: Unknown (04/19/2024)   Epic    Unable to Pay for Housing in the Last Year: No    Number of Times Moved in the Last Year: Not on file    Homeless in the Last Year: No  Utilities: Not At Risk (04/19/2024)   Epic    Threatened with loss of utilities: No  Health Literacy: Adequate Health Literacy (04/19/2024)   B1300 Health Literacy    Frequency of need for help with medical instructions: Never   Past Surgical History:  Procedure Laterality Date   ABDOMINAL HYSTERECTOMY     1986   APPENDECTOMY     1983   BREAST EXCISIONAL BIOPSY Right 1990's   BREAST SURGERY     rt breast cyst done in the 90's   CHOLECYSTECTOMY     GALLBLADDER SURGERY     1983   ROTATOR CUFF REPAIR     right   SPINE SURGERY     TONSILLECTOMY     1973   Past Surgical History:  Procedure Laterality Date   ABDOMINAL HYSTERECTOMY     1986   APPENDECTOMY     1983   BREAST EXCISIONAL BIOPSY Right 1990's   BREAST SURGERY     rt breast cyst done in the 90's   CHOLECYSTECTOMY     GALLBLADDER SURGERY     1983   ROTATOR CUFF REPAIR     right   SPINE SURGERY     TONSILLECTOMY     1973   Past Medical History:  Diagnosis Date   Calcific tendonitis    Cervical spondylosis without myelopathy    Depression    Diabetes mellitus    Esophageal stricture    Fibromyalgia    GERD (gastroesophageal reflux disease)    Hiatal hernia    Hyperlipidemia    Hypertension    Sleep apnea    BP (!) 123/56   Pulse 95   Ht 5' 1 (1.549 m)   Wt 130 lb (59 kg)   SpO2 92%   BMI 24.56 kg/m   Opioid Risk Score:   Fall Risk Score:  `1  Depression screen PHQ 2/9     06/22/2024    1:07 PM 05/03/2024    1:42 PM 04/19/2024    3:19 PM  04/12/2024   11:29 AM 02/03/2024    1:44 PM 12/02/2023    1:27 PM 10/06/2023    2:40 PM  Depression screen PHQ  2/9  Decreased Interest 0 0 0 1 1 3  0  Down, Depressed, Hopeless 0 0 1 1 1 3  0  PHQ - 2 Score 0 0 1 2 2 6  0  Altered sleeping  0     0  Tired, decreased energy  0     0  Change in appetite  0     0  Feeling bad or failure about yourself   0     0  Trouble concentrating  0     0  Moving slowly or fidgety/restless  0     0  Suicidal thoughts  0     0  PHQ-9 Score  0      0   Difficult doing work/chores  Not difficult at all     Not difficult at all     Data saved with a previous flowsheet row definition      Review of Systems  Musculoskeletal:  Positive for back pain and neck pain.       Left arm pain Back of right leg spasms Left shoulder pain  All other systems reviewed and are negative.      Objective:   Physical Exam Vitals and nursing note reviewed.  Constitutional:      Appearance: Normal appearance.  Cardiovascular:     Rate and Rhythm: Normal rate and regular rhythm.     Pulses: Normal pulses.     Heart sounds: Normal heart sounds.  Pulmonary:     Effort: Pulmonary effort is normal.     Breath sounds: Normal breath sounds.  Musculoskeletal:     Comments: Normal Muscle Bulk and Muscle Testing Reveals:  Upper Extremities: Full ROM and Muscle Strength 5/5 Thoracic Paraspinal Tenderness: T-4-T-6 Lower Extremities: Full ROM and Muscle Strength 5/5Arises from chair with ease Narrow Based  Gait     Skin:    General: Skin is warm and dry.  Neurological:     Mental Status: She is alert and oriented to person, place, and time.  Psychiatric:        Mood and Affect: Mood normal.        Behavior: Behavior normal.          Assessment & Plan:  1. Fibromyalgia with myofascial pain: Continue home exercise program and heat therapy. In the past Robin Morgan was prescribed in the past  Cyclobenzaprine  it was ineffective, Tizanidine   caused daytime drowsiness, we  will prescribed Baclofen  with instructions, she will send a My-Chart message in 1-2 weeks with update, she verbalizes understanding.  08/31/2024.  2. Rotator cuff syndrome on the left: Ortho following. Continue with stretching exercises as tolerated and heat therapy. 08/31/2024 3. Cervicalgia. Post-laminectomy syndrome, facet arthropathy: Cervical Radiculopathy: Continue current medication regimen with Gabapentin . 08/31/2024  S/ P  EMG with Dr. Babs on 03/08/2017: Diagnosed with Moderate to Severe CTS of Left wrist: 08/31/2024. 4. Depression: Continue current medication regimen with Cymbalta . PCP Following. Current  dose 90 mg daily. 08/31/2024 5. Mid/low back pain/ Lumbar Spondylosis: Continue Current Medication and stretching and heat therapy. 08/31/2024 Refilled:  Morgan  Sulfate IR 15 mg one tablet three times  a day as needed for pain # 80.  6. Lumbar Radiculopathy:  Continue current medication regimen with Gabapentin . 08/31/2024 7. OA of right hand: Continue Voltaren  gel/ May substitute with Voltaren  Tablet, she realizes she can't use both and verbalizes understanding. Continue with  heat therapy. 08/31/2024 8. Migraines: Continue with headache Journal. Continue Topamax .  Continue to Monitor  08/31/2024  9. Left CTS:S/P EMG on 03/08/2017: S/P Carpal Tunnel Release on 06/14/2017 via . Dr. Ahmad. 08/31/2024 10.. Muscle Spasm: RX: Baclofen    Robin Morgan was prescribed in the past  Cyclobenzaprine  it was ineffective, Tizanidine   caused daytime drowsiness, we will prescribed Baclofen  with instructions, she will send a My-Chart message in 1-2 weeks with update, she verbalizes understanding.  08/31/2024. Continue to Monitor.  11. Chronic Left Shoulder Pain:Ortho Following: Her scheduled  surgery is pending at this time.  Continue HEP as Tolerated. Continue to Monitor. 08/31/2024  12. Fall at Home: No falls since last visit. Continue to Monitor. 08/31/2024      F/U in 2 months             "

## 2024-09-01 ENCOUNTER — Telehealth: Payer: Self-pay

## 2024-09-01 DIAGNOSIS — R350 Frequency of micturition: Secondary | ICD-10-CM

## 2024-09-01 NOTE — Telephone Encounter (Signed)
 Left VM asking patient to call office to clarify information.

## 2024-09-01 NOTE — Telephone Encounter (Signed)
 Copied from CRM #[id number]. Topic: Referral - Request for Referral >> Aug 31, 2024  3:11 PM Antwanette L wrote: Did the patient discuss referral with their provider in the last year? Yes   Appointment offered? No  Type of order/referral and detailed reason for visit: Urology(MRI)  Preference of office, provider, location: The patient wants to see. Dr. Morene Salines at [facility] located at [address], [place], [place] [address]. Phone number: [phone]  If referral order, have you been seen by this specialty before? Yes. Patient seen a urologist within the last 5-10 years. The patient  has been to [facility] but doesn't know the name of the provider   Can we respond through MyChart? No. Patient can be reached at [phone]

## 2024-09-04 NOTE — Telephone Encounter (Signed)
 Called and left another message for patient to clarify reason for referral.

## 2024-09-05 ENCOUNTER — Other Ambulatory Visit: Payer: Self-pay

## 2024-09-05 DIAGNOSIS — R350 Frequency of micturition: Secondary | ICD-10-CM

## 2024-09-05 LAB — DRUG TOX MONITOR 1 W/CONF, ORAL FLD
Amphetamines: NEGATIVE ng/mL
Barbiturates: NEGATIVE ng/mL
Benzodiazepines: NEGATIVE ng/mL
Buprenorphine: NEGATIVE ng/mL
Cocaine: NEGATIVE ng/mL
Fentanyl: NEGATIVE ng/mL
Heroin Metabolite: NEGATIVE ng/mL
MARIJUANA: NEGATIVE ng/mL
MDMA: NEGATIVE ng/mL
Meprobamate: NEGATIVE ng/mL
Methadone: NEGATIVE ng/mL
Nicotine Metabolite: NEGATIVE ng/mL
Opiates: NEGATIVE ng/mL
Phencyclidine: NEGATIVE ng/mL
Tapentadol: NEGATIVE ng/mL
Tramadol: NEGATIVE ng/mL
Zolpidem: NEGATIVE ng/mL

## 2024-09-05 LAB — DRUG TOX ALC METAB W/CON, ORAL FLD: Alcohol Metabolite: NEGATIVE ng/mL

## 2024-09-05 NOTE — Telephone Encounter (Signed)
 Please advise on sending in another referral. Please and thank you.   Copied from CRM #[id number]. Topic: Referral - Request for Referral >> Aug 31, 2024  3:11 PM Antwanette L wrote: Did the patient discuss referral with their provider in the last year? Yes   Appointment offered? No  Type of order/referral and detailed reason for visit: Urology(MRI)  Preference of office, provider, location: The patient wants to see. Dr. Morene Salines at [facility] located at [address], [place], [place] [address]. Phone number: [phone]  If referral order, have you been seen by this specialty before? Yes. Patient seen a urologist within the last 5-10 years. The patient  has been to [facility] but doesn't know the name of the provider   Can we respond through MyChart? No. Patient can be reached at [phone] >> Sep 04, 2024  3:12 PM Macario HERO wrote: Patient returning call from [facility] regarding referral. Patient said it's regarding urinary urgency.

## 2024-09-05 NOTE — Telephone Encounter (Signed)
 Referral placed

## 2024-09-05 NOTE — Progress Notes (Unsigned)
 "  Subjective:    Patient ID: Robin Morgan, female    DOB: 1956/01/09, 69 y.o.   MRN: [id number]  HPI  Discussed the use of AI scribe software for clinical note transcription with the patient, who gave verbal consent to proceed.  History of Present Illness [name] is a 69 year old female with chronic pain syndrome, fibromyalgia, cervical post-laminectomy syndrome, and bilateral rotator cuff syndrome who presents for follow-up of chronic pain and right shoulder surgical planning.  Chronic Pain and Muscle Spasms: - Severe chronic pain involving the neck, low back, and bilateral shoulders - Prominent muscle tightness and spasms in the cervical region - Neck pain frequently associated with headaches, often localized to the posterior neck - Regular use of heating pad for symptomatic relief - Current pain regimen includes morphine  immediate release 15 mg up to three times daily; typically omits third dose at night if taking Ambien  - Baclofen  5 mg recently added for muscle spasm; not used for headaches - Cautious about medication interactions, particularly with pain and sleep medications  Headache Management: - Headaches often associated with neck pain, localized to posterior neck - Cymbalta  and Topamax  taken nightly, resulting in improved headache frequency and severity  Right Shoulder Pain and Surgical Planning: - Persistent right shoulder pain - Unable to lie on affected side due to pain - Surgery was scheduled but canceled the day prior due to a dog bite requiring antibiotics; complications necessitated two additional courses of antibiotics - Awaiting further evaluation by surgeon to reschedule procedure - No new interventions since surgery cancellation - Seeks guidance on timing for surgery  Bladder Dysfunction: - Bladder dysfunction for nearly five months, characterized by urinary incontinence and frequency - Requires frequent use of pull-ups and pads - Scheduled for  initial evaluation by urology in early February - Concerned about possible bladder prolapse due to family history (both sisters required surgical intervention for similar issues following hysterectomy) - No prior bladder testing or treatment for urinary symptoms     Pain Inventory Average Pain 8 Pain Right Now 9 My pain is constant, sharp, stabbing, and aching  In the last 24 hours, has pain interfered with the following? General activity 8 Relation with others 8 Enjoyment of life 8 What TIME of day is your pain at its worst? daytime, evening, and night Sleep (in general) Poor  Pain is worse with: walking, sitting, and standing Pain improves with: rest and medication Relief from Meds: 8  Family History  Problem Relation Age of Onset   Alzheimer's disease Mother    Diabetes Father    Heart disease Maternal Grandmother    Breast cancer Paternal Grandmother 65   Colon cancer Neg Hx    Esophageal cancer Neg Hx    Rectal cancer Neg Hx    Stomach cancer Neg Hx    Social History   Socioeconomic History   Marital status: Divorced    Spouse name: Not on file   Number of children: 1   Years of education: Not on file   Highest education level: Not on file  Occupational History   Occupation: disabled  Tobacco Use   Smoking status: Never   Smokeless tobacco: Never  Vaping Use   Vaping status: Never Used  Substance and Sexual Activity   Alcohol  use: No   Drug use: No   Sexual activity: Not Currently  Other Topics Concern   Not on file  Social History Narrative   Not on file   Social  Drivers of Health   Tobacco Use: Low Risk (08/31/2024)   Patient History    Smoking Tobacco Use: Never    Smokeless Tobacco Use: Never    Passive Exposure: Not on file  Financial Resource Strain: Low Risk (04/19/2024)   Overall Financial Resource Strain (CARDIA)    Difficulty of Paying Living Expenses: Not hard at all  Food Insecurity: No Food Insecurity (04/19/2024)   Epic    Worried  About Programme Researcher, Broadcasting/film/video in the Last Year: Never true    Ran Out of Food in the Last Year: Never true  Transportation Needs: No Transportation Needs (04/19/2024)   Epic    Lack of Transportation (Medical): No    Lack of Transportation (Non-Medical): No  Physical Activity: Inactive (04/19/2024)   Exercise Vital Sign    Days of Exercise per Week: 0 days    Minutes of Exercise per Session: 0 min  Stress: No Stress Concern Present (04/19/2024)   [employer] of Occupational Health - Occupational Stress Questionnaire    Feeling of Stress: Only a little  Social Connections: Moderately Isolated (04/19/2024)   Social Connection and Isolation Panel    Frequency of Communication with Friends and Family: More than three times a week    Frequency of Social Gatherings with Friends and Family: More than three times a week    Attends Religious Services: 1 to 4 times per year    Active Member of Clubs or Organizations: No    Attends Banker Meetings: Never    Marital Status: Divorced  Depression (PHQ2-9): Low Risk (06/22/2024)   Depression (PHQ2-9)    PHQ-2 Score: 0  Alcohol  Screen: Low Risk (04/19/2024)   Alcohol  Screen    Last Alcohol  Screening Score (AUDIT): 0  Housing: Unknown (04/19/2024)   Epic    Unable to Pay for Housing in the Last Year: No    Number of Times Moved in the Last Year: Not on file    Homeless in the Last Year: No  Utilities: Not At Risk (04/19/2024)   Epic    Threatened with loss of utilities: No  Health Literacy: Adequate Health Literacy (04/19/2024)   B1300 Health Literacy    Frequency of need for help with medical instructions: Never   Past Surgical History:  Procedure Laterality Date   ABDOMINAL HYSTERECTOMY     1986   APPENDECTOMY     1983   BREAST EXCISIONAL BIOPSY Right 1990's   BREAST SURGERY     rt breast cyst done in the 90's   CHOLECYSTECTOMY     GALLBLADDER SURGERY     1983   ROTATOR CUFF REPAIR     right   SPINE SURGERY      TONSILLECTOMY     1973   Past Surgical History:  Procedure Laterality Date   ABDOMINAL HYSTERECTOMY     1986   APPENDECTOMY     1983   BREAST EXCISIONAL BIOPSY Right 1990's   BREAST SURGERY     rt breast cyst done in the 90's   CHOLECYSTECTOMY     GALLBLADDER SURGERY     1983   ROTATOR CUFF REPAIR     right   SPINE SURGERY     TONSILLECTOMY     1973   Past Medical History:  Diagnosis Date   Calcific tendonitis    Cervical spondylosis without myelopathy    Depression    Diabetes mellitus    Esophageal stricture    Fibromyalgia  GERD (gastroesophageal reflux disease)    Hiatal hernia    Hyperlipidemia    Hypertension    Sleep apnea    There were no vitals taken for this visit.  Opioid Risk Score:   Fall Risk Score:  `1  Depression screen [facility] 2/9     06/22/2024    1:07 PM 05/03/2024    1:42 PM 04/19/2024    3:19 PM 04/12/2024   11:29 AM 02/03/2024    1:44 PM 12/02/2023    1:27 PM 10/06/2023    2:40 PM  Depression screen PHQ 2/9  Decreased Interest 0 0 0 1 1 3  0  Down, Depressed, Hopeless 0 0 1 1 1 3  0  PHQ - 2 Score 0 0 1 2 2 6  0  Altered sleeping  0     0  Tired, decreased energy  0     0  Change in appetite  0     0  Feeling bad or failure about yourself   0     0  Trouble concentrating  0     0  Moving slowly or fidgety/restless  0     0  Suicidal thoughts  0     0  PHQ-9 Score  0      0   Difficult doing work/chores  Not difficult at all     Not difficult at all     Data saved with a previous flowsheet row definition    Review of Systems  Musculoskeletal:  Positive for back pain and neck pain.       Pain in both hands Pain in right leg down to toes Pain in both shoulders  All other systems reviewed and are negative.      Objective:   Physical Exam General: No acute distress HEENT: NCAT, EOMI, oral membranes moist Cards: reg rate  Chest: normal effort Abdomen: Soft, NT, ND Skin: dry, intact Extremities: no edema Psych: pleasant and  appropriate  Neuro:  Patient is cognitively appropriate.  Neuro cranial nerve exam.  Normal insight and awareness.  Strength is grossly 4 out of 5 in both upper extremities proximal distal.  Reflexes are 1-2+.  Sensory remains intact in all 4's. SABRA  No changes in motor and sensory exam today. Musculoskeletal: fair ROM cervical spine. Posture better. she is able to rotate slightly and forward flex with some discomfort but again limited due to her spasms on the right.       Assessment & Plan:     1. Fibromyalgia/myofascial pain involving the left shoulder girdle, particularly the rhomboid area. Discussed stretching and exercise at length              -B12, vitamin D  supplements. Continue thyroid  supplementation also  2. Rotator cuff syndrome bilaterally.            -still has left RTC surgery pending.            3. Cervicalgia. Post-laminectomy syndrome, facet arthropathy:  Cervical Radiculopathy:     Continue Gabapentin .           -MS IR 15mg  q12 prn #80. this was not refilled today.             We will continue the controlled substance monitoring program, this consists of regular clinic visits, examinations, routine drug screening, pill counts as well as use of [place]  Controlled Substance Reporting System. [place].             -?  cervical MBB's of cervical spine           -C7-T1 ESI without beneift. Repeat injections with some benefit           C5-6 foraminal stenosis on MRI-ESI's without much benefit            -no surgery at present -modalities, posture discussed 4. Depression/sleep: This is really her biggest problem. Continue Cymbalta .   90 mg daily        -continue neuropsych for pain mgt techniques, coping skills==very helpful             -melatonin 3 to 6 mg at bedtime.               -mood seems more up beat! 6. Lumbar back pain-- a lot of this is muscle/myofasical             -baclofen  trial   -HEP as possible 7 OA of right hand:    Voltaren  gel and heat  therapy.              . 8. Migraines: stable, largely driven by shoulder girdle and neck pain.             -  topamax  at 100 mg at bedtime has helped                 -severe headaches only happening rarely  -cervical pain mgt, heat, ice, etc 9. Left CTS:S/P EMG on 03/08/2017: S/P Carpal Tunnel Release on 06/14/2017 via . Dr. Ortman.  10. OIC: movantik            -continue diet/probiotic and diet 11. Urinary incontinence  -urology appt pending   20+ min of face to face patient care time were spent during this visit. All questions were encouraged and answered.  Follow up with NP in a month or two             "

## 2024-09-06 ENCOUNTER — Encounter: Payer: Self-pay | Admitting: Physical Medicine & Rehabilitation

## 2024-09-06 ENCOUNTER — Encounter: Admitting: Physical Medicine & Rehabilitation

## 2024-09-06 VITALS — BP 106/64 | HR 95 | Ht 61.0 in | Wt 127.0 lb

## 2024-09-06 DIAGNOSIS — G43019 Migraine without aura, intractable, without status migrainosus: Secondary | ICD-10-CM

## 2024-09-06 DIAGNOSIS — M961 Postlaminectomy syndrome, not elsewhere classified: Secondary | ICD-10-CM

## 2024-09-06 DIAGNOSIS — M5416 Radiculopathy, lumbar region: Secondary | ICD-10-CM | POA: Diagnosis not present

## 2024-09-06 NOTE — Patient Instructions (Signed)
" °  VISIT SUMMARY: During today's visit, we discussed your chronic pain, muscle spasms, headaches, right shoulder pain, and bladder dysfunction. We reviewed your current medications and made adjustments to better manage your symptoms. We also discussed your upcoming urology evaluation and the rescheduling of your right shoulder surgery.  YOUR PLAN: -CHRONIC PAIN AND MUSCLE SPASMS: Chronic pain syndrome involves long-term pain that can affect various parts of the body. We will continue your current pain management regimen, including morphine  for pain relief and baclofen  for muscle spasms. You should use the heating pad and relaxation techniques to help with muscle tightness. Please monitor for any adverse effects from baclofen  and avoid using morphine  for sleep. Continue using Voltaren  gel for localized pain and maintain good posture, heat, and ice applications.  -HEADACHE MANAGEMENT: Your headaches, often linked to neck pain, have improved with Topamax  and Cymbalta . Continue taking these medications as prescribed. We discussed the possibility of increasing the Topamax  dose if your symptoms worsen. Please keep track of your headache frequency and severity.  -RIGHT SHOULDER PAIN AND SURGICAL PLANNING: Your right shoulder pain has been persistent, and we need to reschedule your surgery that was previously canceled. We will await further evaluation by your surgeon to determine the best timing for the procedure. In the meantime, avoid lying on the affected side and continue with your current pain management strategies.  -BLADDER DYSFUNCTION: Bladder dysfunction involves issues with urinary incontinence and frequency. You are scheduled for an initial evaluation by urology in early February. We will address any concerns about possible bladder prolapse during this evaluation.  -OPIOID-INDUCED CONSTIPATION: Opioid-induced constipation is a common side effect of opioid medications. Your condition is well controlled  with Movantik , and you have regular bowel movements. Continue taking Movantik  as prescribed and maintain your current bowel regimen. Your recent colonoscopy results were normal.  INSTRUCTIONS: Please follow up with urology in early February for your bladder dysfunction evaluation. We will also need to reschedule your right shoulder surgery after further evaluation by your surgeon.    Contains text generated by [employer].   "

## 2024-09-08 ENCOUNTER — Other Ambulatory Visit: Payer: Self-pay | Admitting: Family Medicine

## 2024-09-15 ENCOUNTER — Other Ambulatory Visit: Payer: Self-pay | Admitting: Family Medicine

## 2024-09-22 ENCOUNTER — Encounter: Admitting: Registered Nurse

## 2024-10-25 ENCOUNTER — Encounter: Admitting: Physical Medicine & Rehabilitation

## 2024-11-30 ENCOUNTER — Encounter: Attending: Registered Nurse | Admitting: Psychology

## 2025-04-24 ENCOUNTER — Ambulatory Visit
